# Patient Record
Sex: Female | Born: 1945
Health system: Southern US, Community
[De-identification: ages and names within clinical notes are randomized; demographics above are authoritative.]

## PROBLEM LIST (undated history)

## (undated) DIAGNOSIS — I208 Other forms of angina pectoris: Secondary | ICD-10-CM

## (undated) DIAGNOSIS — F329 Major depressive disorder, single episode, unspecified: Secondary | ICD-10-CM

## (undated) DIAGNOSIS — I130 Hypertensive heart and chronic kidney disease with heart failure and stage 1 through stage 4 chronic kidney disease, or unspecified chronic kidney disease: Secondary | ICD-10-CM

## (undated) DIAGNOSIS — J189 Pneumonia, unspecified organism: Secondary | ICD-10-CM

## (undated) DIAGNOSIS — F32A Depression, unspecified: Secondary | ICD-10-CM

## (undated) DIAGNOSIS — W19XXXA Unspecified fall, initial encounter: Secondary | ICD-10-CM

## (undated) DIAGNOSIS — E785 Hyperlipidemia, unspecified: Secondary | ICD-10-CM

## (undated) DIAGNOSIS — F0153 Vascular dementia, unspecified severity, with mood disturbance: Secondary | ICD-10-CM

## (undated) DIAGNOSIS — N189 Chronic kidney disease, unspecified: Secondary | ICD-10-CM

## (undated) DIAGNOSIS — K5909 Other constipation: Secondary | ICD-10-CM

## (undated) DIAGNOSIS — K219 Gastro-esophageal reflux disease without esophagitis: Secondary | ICD-10-CM

## (undated) DIAGNOSIS — C20 Malignant neoplasm of rectum: Secondary | ICD-10-CM

## (undated) DIAGNOSIS — I69922 Dysarthria following unspecified cerebrovascular disease: Secondary | ICD-10-CM

## (undated) DIAGNOSIS — S3210XA Unspecified fracture of sacrum, initial encounter for closed fracture: Secondary | ICD-10-CM

## (undated) DIAGNOSIS — I219 Acute myocardial infarction, unspecified: Secondary | ICD-10-CM

## (undated) DIAGNOSIS — F0390 Unspecified dementia without behavioral disturbance: Secondary | ICD-10-CM

## (undated) DIAGNOSIS — R410 Disorientation, unspecified: Secondary | ICD-10-CM

## (undated) DIAGNOSIS — J690 Pneumonitis due to inhalation of food and vomit: Secondary | ICD-10-CM

## (undated) DIAGNOSIS — J449 Chronic obstructive pulmonary disease, unspecified: Secondary | ICD-10-CM

## (undated) DIAGNOSIS — I2510 Atherosclerotic heart disease of native coronary artery without angina pectoris: Secondary | ICD-10-CM

## (undated) DIAGNOSIS — F19921 Other psychoactive substance use, unspecified with intoxication with delirium: Secondary | ICD-10-CM

## (undated) DIAGNOSIS — F0151 Vascular dementia with behavioral disturbance: Secondary | ICD-10-CM

## (undated) DIAGNOSIS — I272 Pulmonary hypertension, unspecified: Secondary | ICD-10-CM

## (undated) DIAGNOSIS — E1151 Type 2 diabetes mellitus with diabetic peripheral angiopathy without gangrene: Secondary | ICD-10-CM

## (undated) DIAGNOSIS — I639 Cerebral infarction, unspecified: Secondary | ICD-10-CM

## (undated) DIAGNOSIS — J9600 Acute respiratory failure, unspecified whether with hypoxia or hypercapnia: Secondary | ICD-10-CM

## (undated) DIAGNOSIS — I5032 Chronic diastolic (congestive) heart failure: Secondary | ICD-10-CM

## (undated) DIAGNOSIS — F0150 Vascular dementia without behavioral disturbance: Secondary | ICD-10-CM

## (undated) DIAGNOSIS — E049 Nontoxic goiter, unspecified: Secondary | ICD-10-CM

## (undated) DIAGNOSIS — Z9981 Dependence on supplemental oxygen: Secondary | ICD-10-CM

## (undated) DIAGNOSIS — G43909 Migraine, unspecified, not intractable, without status migrainosus: Secondary | ICD-10-CM

## (undated) DIAGNOSIS — T50905A Adverse effect of unspecified drugs, medicaments and biological substances, initial encounter: Secondary | ICD-10-CM

## (undated) HISTORY — DX: Other forms of angina pectoris: I20.8

## (undated) HISTORY — DX: Type 2 diabetes mellitus with diabetic peripheral angiopathy without gangrene: E11.51

## (undated) HISTORY — DX: Malignant neoplasm of rectum: C20

## (undated) HISTORY — DX: Chronic diastolic (congestive) heart failure: I50.32

## (undated) HISTORY — DX: Dysarthria following unspecified cerebrovascular disease: I69.922

## (undated) HISTORY — DX: Vascular dementia without behavioral disturbance: F01.50

## (undated) HISTORY — DX: Other psychoactive substance use, unspecified with intoxication with delirium: F19.921

## (undated) HISTORY — DX: Atherosclerotic heart disease of native coronary artery without angina pectoris: I25.10

## (undated) HISTORY — DX: Pulmonary hypertension, unspecified: I27.20

## (undated) HISTORY — PX: KYPHOPLASTY: SHX5884

## (undated) HISTORY — DX: Nontoxic goiter, unspecified: E04.9

## (undated) HISTORY — DX: Unspecified fracture of sacrum, initial encounter for closed fracture: S32.10XA

## (undated) HISTORY — DX: Disorientation, unspecified: R41.0

## (undated) HISTORY — DX: Hyperlipidemia, unspecified: E78.5

## (undated) HISTORY — DX: Vascular dementia with behavioral disturbance: F01.51

## (undated) HISTORY — PX: APPENDECTOMY: SHX54

## (undated) HISTORY — DX: Acute respiratory failure, unspecified whether with hypoxia or hypercapnia: J96.00

## (undated) HISTORY — DX: Adverse effect of unspecified drugs, medicaments and biological substances, initial encounter: T50.905A

## (undated) HISTORY — DX: Depression, unspecified: F32.A

## (undated) HISTORY — PX: ABDOMINAL HYSTERECTOMY: SHX81

## (undated) HISTORY — DX: Unspecified fall, initial encounter: W19.XXXA

## (undated) HISTORY — DX: Pneumonitis due to inhalation of food and vomit: J69.0

## (undated) HISTORY — DX: Hypertensive heart and chronic kidney disease with heart failure and stage 1 through stage 4 chronic kidney disease, or unspecified chronic kidney disease: I13.0

## (undated) HISTORY — DX: Major depressive disorder, single episode, unspecified: F32.9

## (undated) HISTORY — PX: COLON SURGERY: SHX602

## (undated) HISTORY — DX: Vascular dementia, unspecified severity, with mood disturbance: F01.53

## (undated) HISTORY — DX: Pneumonia, unspecified organism: J18.9

## (undated) HISTORY — PX: COLOSTOMY: SHX63

## (undated) HISTORY — DX: Other constipation: K59.09

## (undated) HISTORY — PX: TONSILLECTOMY: SUR1361

## (undated) HISTORY — DX: Cerebral infarction, unspecified: I63.9

## (undated) HISTORY — DX: Acute myocardial infarction, unspecified: I21.9

## (undated) HISTORY — PX: TUBAL LIGATION: SHX77

## (undated) HISTORY — DX: Chronic obstructive pulmonary disease, unspecified: J44.9

---

## 2001-03-15 ENCOUNTER — Emergency Department (HOSPITAL_COMMUNITY): Admission: EM | Admit: 2001-03-15 | Discharge: 2001-03-15 | Payer: Self-pay | Admitting: Emergency Medicine

## 2001-03-18 ENCOUNTER — Emergency Department (HOSPITAL_COMMUNITY): Admission: EM | Admit: 2001-03-18 | Discharge: 2001-03-18 | Payer: Self-pay | Admitting: Emergency Medicine

## 2001-03-19 ENCOUNTER — Emergency Department (HOSPITAL_COMMUNITY): Admission: EM | Admit: 2001-03-19 | Discharge: 2001-03-19 | Payer: Self-pay | Admitting: *Deleted

## 2001-03-21 ENCOUNTER — Emergency Department (HOSPITAL_COMMUNITY): Admission: EM | Admit: 2001-03-21 | Discharge: 2001-03-21 | Payer: Self-pay | Admitting: Emergency Medicine

## 2001-03-22 ENCOUNTER — Emergency Department (HOSPITAL_COMMUNITY): Admission: EM | Admit: 2001-03-22 | Discharge: 2001-03-23 | Payer: Self-pay | Admitting: *Deleted

## 2003-07-08 ENCOUNTER — Emergency Department (HOSPITAL_COMMUNITY): Admission: EM | Admit: 2003-07-08 | Discharge: 2003-07-09 | Payer: Self-pay | Admitting: Emergency Medicine

## 2004-09-02 ENCOUNTER — Inpatient Hospital Stay (HOSPITAL_COMMUNITY): Admission: EM | Admit: 2004-09-02 | Discharge: 2004-09-04 | Payer: Self-pay | Admitting: Emergency Medicine

## 2004-09-02 ENCOUNTER — Encounter (INDEPENDENT_AMBULATORY_CARE_PROVIDER_SITE_OTHER): Payer: Self-pay | Admitting: Cardiology

## 2005-01-09 ENCOUNTER — Emergency Department (HOSPITAL_COMMUNITY): Admission: EM | Admit: 2005-01-09 | Discharge: 2005-01-09 | Payer: Self-pay | Admitting: Emergency Medicine

## 2005-10-25 ENCOUNTER — Inpatient Hospital Stay (HOSPITAL_COMMUNITY): Admission: EM | Admit: 2005-10-25 | Discharge: 2005-11-03 | Payer: Self-pay | Admitting: Emergency Medicine

## 2005-10-28 ENCOUNTER — Ambulatory Visit: Payer: Self-pay | Admitting: Cardiology

## 2005-10-28 ENCOUNTER — Encounter: Payer: Self-pay | Admitting: Cardiology

## 2005-10-30 ENCOUNTER — Encounter: Payer: Self-pay | Admitting: Internal Medicine

## 2006-07-16 ENCOUNTER — Ambulatory Visit: Payer: Self-pay | Admitting: Nurse Practitioner

## 2006-08-10 ENCOUNTER — Ambulatory Visit: Payer: Self-pay | Admitting: Nurse Practitioner

## 2006-10-11 ENCOUNTER — Ambulatory Visit: Payer: Self-pay | Admitting: Nurse Practitioner

## 2007-01-16 ENCOUNTER — Emergency Department (HOSPITAL_COMMUNITY): Admission: EM | Admit: 2007-01-16 | Discharge: 2007-01-16 | Payer: Self-pay | Admitting: Emergency Medicine

## 2007-02-24 ENCOUNTER — Ambulatory Visit: Payer: Self-pay | Admitting: Nurse Practitioner

## 2007-03-03 ENCOUNTER — Ambulatory Visit: Payer: Self-pay | Admitting: Nurse Practitioner

## 2007-03-17 ENCOUNTER — Ambulatory Visit: Payer: Self-pay | Admitting: Nurse Practitioner

## 2007-07-21 ENCOUNTER — Emergency Department (HOSPITAL_COMMUNITY): Admission: EM | Admit: 2007-07-21 | Discharge: 2007-07-21 | Payer: Self-pay | Admitting: Emergency Medicine

## 2007-07-27 ENCOUNTER — Encounter (INDEPENDENT_AMBULATORY_CARE_PROVIDER_SITE_OTHER): Payer: Self-pay | Admitting: *Deleted

## 2007-09-07 DIAGNOSIS — I10 Essential (primary) hypertension: Secondary | ICD-10-CM

## 2007-09-07 DIAGNOSIS — I6350 Cerebral infarction due to unspecified occlusion or stenosis of unspecified cerebral artery: Secondary | ICD-10-CM | POA: Insufficient documentation

## 2008-03-06 ENCOUNTER — Ambulatory Visit: Payer: Self-pay | Admitting: Vascular Surgery

## 2008-03-06 ENCOUNTER — Inpatient Hospital Stay (HOSPITAL_COMMUNITY): Admission: EM | Admit: 2008-03-06 | Discharge: 2008-03-11 | Payer: Self-pay | Admitting: Emergency Medicine

## 2008-03-06 ENCOUNTER — Ambulatory Visit: Payer: Self-pay | Admitting: Cardiology

## 2008-03-06 ENCOUNTER — Encounter (INDEPENDENT_AMBULATORY_CARE_PROVIDER_SITE_OTHER): Payer: Self-pay | Admitting: *Deleted

## 2008-03-09 ENCOUNTER — Encounter (INDEPENDENT_AMBULATORY_CARE_PROVIDER_SITE_OTHER): Payer: Self-pay | Admitting: Internal Medicine

## 2008-03-10 ENCOUNTER — Encounter: Payer: Self-pay | Admitting: Cardiology

## 2008-12-21 ENCOUNTER — Ambulatory Visit: Payer: Self-pay | Admitting: Family Medicine

## 2008-12-21 ENCOUNTER — Encounter (INDEPENDENT_AMBULATORY_CARE_PROVIDER_SITE_OTHER): Payer: Self-pay | Admitting: Internal Medicine

## 2008-12-21 LAB — CONVERTED CEMR LAB
ALT: 9 units/L (ref 0–35)
AST: 12 units/L (ref 0–37)
Amphetamine Screen, Ur: NEGATIVE
Barbiturate Quant, Ur: NEGATIVE
Cocaine Metabolites: NEGATIVE
Creatinine, Ser: 1.41 mg/dL — ABNORMAL HIGH (ref 0.40–1.20)
Opiate Screen, Urine: NEGATIVE
Phencyclidine (PCP): NEGATIVE
Total Bilirubin: 0.3 mg/dL (ref 0.3–1.2)

## 2008-12-22 ENCOUNTER — Encounter (INDEPENDENT_AMBULATORY_CARE_PROVIDER_SITE_OTHER): Payer: Self-pay | Admitting: Internal Medicine

## 2008-12-25 ENCOUNTER — Ambulatory Visit: Payer: Self-pay | Admitting: Internal Medicine

## 2009-01-14 ENCOUNTER — Ambulatory Visit: Payer: Self-pay | Admitting: Internal Medicine

## 2009-01-14 ENCOUNTER — Encounter (INDEPENDENT_AMBULATORY_CARE_PROVIDER_SITE_OTHER): Payer: Self-pay | Admitting: Internal Medicine

## 2009-01-14 LAB — CONVERTED CEMR LAB: Potassium: 3.4 meq/L — ABNORMAL LOW (ref 3.5–5.3)

## 2009-02-26 ENCOUNTER — Ambulatory Visit: Payer: Self-pay | Admitting: Family Medicine

## 2009-02-26 LAB — CONVERTED CEMR LAB
CO2: 27 meq/L (ref 19–32)
Chloride: 103 meq/L (ref 96–112)
Creatinine, Ser: 1.37 mg/dL — ABNORMAL HIGH (ref 0.40–1.20)
Potassium: 4.4 meq/L (ref 3.5–5.3)

## 2009-02-27 ENCOUNTER — Encounter (INDEPENDENT_AMBULATORY_CARE_PROVIDER_SITE_OTHER): Payer: Self-pay | Admitting: Family Medicine

## 2009-02-28 ENCOUNTER — Ambulatory Visit: Payer: Self-pay | Admitting: Internal Medicine

## 2009-03-01 ENCOUNTER — Encounter (INDEPENDENT_AMBULATORY_CARE_PROVIDER_SITE_OTHER): Payer: Self-pay | Admitting: Internal Medicine

## 2009-03-14 ENCOUNTER — Ambulatory Visit: Payer: Self-pay | Admitting: Internal Medicine

## 2009-03-15 ENCOUNTER — Encounter (INDEPENDENT_AMBULATORY_CARE_PROVIDER_SITE_OTHER): Payer: Self-pay | Admitting: Internal Medicine

## 2009-03-18 ENCOUNTER — Encounter (INDEPENDENT_AMBULATORY_CARE_PROVIDER_SITE_OTHER): Payer: Self-pay | Admitting: Internal Medicine

## 2009-03-18 ENCOUNTER — Ambulatory Visit: Payer: Self-pay | Admitting: Internal Medicine

## 2009-03-18 LAB — CONVERTED CEMR LAB
BUN: 20 mg/dL (ref 6–23)
Chloride: 100 meq/L (ref 96–112)
Creatinine, Ser: 1.41 mg/dL — ABNORMAL HIGH (ref 0.40–1.20)
Glucose, Bld: 101 mg/dL — ABNORMAL HIGH (ref 70–99)

## 2009-04-03 ENCOUNTER — Ambulatory Visit: Payer: Self-pay | Admitting: Internal Medicine

## 2009-05-06 ENCOUNTER — Ambulatory Visit: Payer: Self-pay | Admitting: Internal Medicine

## 2009-05-17 ENCOUNTER — Ambulatory Visit: Payer: Self-pay | Admitting: Internal Medicine

## 2009-05-23 ENCOUNTER — Ambulatory Visit (HOSPITAL_COMMUNITY): Admission: RE | Admit: 2009-05-23 | Discharge: 2009-05-23 | Payer: Self-pay | Admitting: Family Medicine

## 2009-05-27 ENCOUNTER — Encounter (INDEPENDENT_AMBULATORY_CARE_PROVIDER_SITE_OTHER): Payer: Self-pay | Admitting: Internal Medicine

## 2009-05-27 ENCOUNTER — Ambulatory Visit: Payer: Self-pay | Admitting: Internal Medicine

## 2009-05-27 LAB — CONVERTED CEMR LAB
Basophils Absolute: 0 10*3/uL (ref 0.0–0.1)
Basophils Relative: 1 % (ref 0–1)
Eosinophils Absolute: 0.4 10*3/uL (ref 0.0–0.7)
MCHC: 32 g/dL (ref 30.0–36.0)
MCV: 79.8 fL (ref 78.0–100.0)
Monocytes Relative: 8 % (ref 3–12)
Neutro Abs: 3.2 10*3/uL (ref 1.7–7.7)
Neutrophils Relative %: 48 % (ref 43–77)
Platelets: 355 10*3/uL (ref 150–400)
Prothrombin Time: 13.8 s (ref 11.6–15.2)
RBC: 5.05 M/uL (ref 3.87–5.11)
RDW: 14.4 % (ref 11.5–15.5)

## 2009-05-29 ENCOUNTER — Encounter (INDEPENDENT_AMBULATORY_CARE_PROVIDER_SITE_OTHER): Payer: Self-pay | Admitting: Internal Medicine

## 2009-05-29 ENCOUNTER — Ambulatory Visit: Payer: Self-pay | Admitting: Internal Medicine

## 2009-05-29 LAB — CONVERTED CEMR LAB
Basophils Absolute: 0.1 10*3/uL (ref 0.0–0.1)
Basophils Relative: 1 % (ref 0–1)
Eosinophils Absolute: 0.3 10*3/uL (ref 0.0–0.7)
MCHC: 31.5 g/dL (ref 30.0–36.0)
MCV: 80.7 fL (ref 78.0–100.0)
Monocytes Relative: 7 % (ref 3–12)
Neutro Abs: 4 10*3/uL (ref 1.7–7.7)
Neutrophils Relative %: 55 % (ref 43–77)
Platelets: 335 10*3/uL (ref 150–400)
RDW: 15 % (ref 11.5–15.5)

## 2009-06-17 ENCOUNTER — Encounter: Payer: Self-pay | Admitting: Gastroenterology

## 2009-07-17 ENCOUNTER — Ambulatory Visit: Payer: Self-pay | Admitting: Oncology

## 2009-07-18 ENCOUNTER — Ambulatory Visit: Payer: Self-pay | Admitting: Internal Medicine

## 2009-07-24 LAB — COMPREHENSIVE METABOLIC PANEL
ALT: 11 U/L (ref 0–35)
AST: 14 U/L (ref 0–37)
BUN: 17 mg/dL (ref 6–23)
CO2: 26 mEq/L (ref 19–32)
Creatinine, Ser: 1.23 mg/dL — ABNORMAL HIGH (ref 0.40–1.20)
Total Bilirubin: 0.4 mg/dL (ref 0.3–1.2)

## 2009-07-24 LAB — CEA: CEA: 6.8 ng/mL — ABNORMAL HIGH (ref 0.0–5.0)

## 2009-07-24 LAB — CBC WITH DIFFERENTIAL/PLATELET
BASO%: 0.6 % (ref 0.0–2.0)
Basophils Absolute: 0 10*3/uL (ref 0.0–0.1)
EOS%: 4.1 % (ref 0.0–7.0)
HCT: 41.3 % (ref 34.8–46.6)
LYMPH%: 36.5 % (ref 14.0–49.7)
MCH: 26.4 pg (ref 25.1–34.0)
MCHC: 33.3 g/dL (ref 31.5–36.0)
NEUT%: 51.1 % (ref 38.4–76.8)
Platelets: 341 10*3/uL (ref 145–400)

## 2009-07-30 ENCOUNTER — Ambulatory Visit: Admission: RE | Admit: 2009-07-30 | Discharge: 2009-10-10 | Payer: Self-pay | Admitting: Radiation Oncology

## 2009-08-02 ENCOUNTER — Ambulatory Visit (HOSPITAL_COMMUNITY): Admission: RE | Admit: 2009-08-02 | Discharge: 2009-08-02 | Payer: Self-pay | Admitting: Oncology

## 2009-08-07 ENCOUNTER — Encounter: Payer: Self-pay | Admitting: Gastroenterology

## 2009-08-09 ENCOUNTER — Telehealth (INDEPENDENT_AMBULATORY_CARE_PROVIDER_SITE_OTHER): Payer: Self-pay | Admitting: *Deleted

## 2009-08-09 ENCOUNTER — Encounter: Payer: Self-pay | Admitting: Gastroenterology

## 2009-08-09 DIAGNOSIS — C20 Malignant neoplasm of rectum: Secondary | ICD-10-CM

## 2009-08-15 ENCOUNTER — Ambulatory Visit: Payer: Self-pay | Admitting: Gastroenterology

## 2009-08-15 ENCOUNTER — Ambulatory Visit (HOSPITAL_COMMUNITY): Admission: RE | Admit: 2009-08-15 | Discharge: 2009-08-15 | Payer: Self-pay | Admitting: Gastroenterology

## 2009-08-15 ENCOUNTER — Encounter: Payer: Self-pay | Admitting: Gastroenterology

## 2009-08-19 ENCOUNTER — Ambulatory Visit: Payer: Self-pay | Admitting: Internal Medicine

## 2009-08-22 ENCOUNTER — Ambulatory Visit (HOSPITAL_COMMUNITY): Admission: RE | Admit: 2009-08-22 | Discharge: 2009-08-22 | Payer: Self-pay | Admitting: Radiation Oncology

## 2009-08-22 ENCOUNTER — Ambulatory Visit: Payer: Self-pay | Admitting: Oncology

## 2009-08-27 ENCOUNTER — Emergency Department (HOSPITAL_COMMUNITY): Admission: EM | Admit: 2009-08-27 | Discharge: 2009-08-27 | Payer: Self-pay | Admitting: Emergency Medicine

## 2009-08-28 ENCOUNTER — Telehealth (INDEPENDENT_AMBULATORY_CARE_PROVIDER_SITE_OTHER): Payer: Self-pay | Admitting: *Deleted

## 2009-09-02 ENCOUNTER — Ambulatory Visit (HOSPITAL_COMMUNITY): Admission: RE | Admit: 2009-09-02 | Discharge: 2009-09-02 | Payer: Self-pay | Admitting: Radiation Oncology

## 2009-09-03 LAB — COMPREHENSIVE METABOLIC PANEL
ALT: <8 U/L (ref 0–35)
AST: 10 U/L (ref 0–37)
Alkaline Phosphatase: 82 U/L (ref 39–117)
Creatinine, Ser: 1.43 mg/dL — ABNORMAL HIGH (ref 0.40–1.20)
Total Bilirubin: 0.4 mg/dL (ref 0.3–1.2)

## 2009-09-03 LAB — CBC WITH DIFFERENTIAL/PLATELET
BASO%: 0.3 % (ref 0.0–2.0)
EOS%: 1.4 % (ref 0.0–7.0)
HCT: 43.1 % (ref 34.8–46.6)
LYMPH%: 12.5 % — ABNORMAL LOW (ref 14.0–49.7)
MCH: 26.6 pg (ref 25.1–34.0)
MCHC: 32.9 g/dL (ref 31.5–36.0)
MONO%: 6.4 % (ref 0.0–14.0)
NEUT%: 79.4 % — ABNORMAL HIGH (ref 38.4–76.8)
lymph#: 0.5 10*3/uL — ABNORMAL LOW (ref 0.9–3.3)

## 2009-09-06 ENCOUNTER — Ambulatory Visit (HOSPITAL_COMMUNITY): Admission: RE | Admit: 2009-09-06 | Discharge: 2009-09-06 | Payer: Self-pay | Admitting: Oncology

## 2009-09-13 LAB — COMPREHENSIVE METABOLIC PANEL
ALT: 12 U/L (ref 0–35)
AST: 17 U/L (ref 0–37)
Albumin: 4.1 g/dL (ref 3.5–5.2)
Calcium: 9 mg/dL (ref 8.4–10.5)
Chloride: 98 mEq/L (ref 96–112)
Potassium: 3.3 mEq/L — ABNORMAL LOW (ref 3.5–5.3)
Total Protein: 7.7 g/dL (ref 6.0–8.3)

## 2009-09-13 LAB — CBC WITH DIFFERENTIAL/PLATELET
BASO%: 0.2 % (ref 0.0–2.0)
EOS%: 2.5 % (ref 0.0–7.0)
HGB: 13.1 g/dL (ref 11.6–15.9)
MCH: 27 pg (ref 25.1–34.0)
MCHC: 32.9 g/dL (ref 31.5–36.0)
MONO#: 0.3 10*3/uL (ref 0.1–0.9)
RDW: 15.9 % — ABNORMAL HIGH (ref 11.2–14.5)
WBC: 4.8 10*3/uL (ref 3.9–10.3)
lymph#: 0.3 10*3/uL — ABNORMAL LOW (ref 0.9–3.3)

## 2009-09-24 ENCOUNTER — Ambulatory Visit: Payer: Self-pay | Admitting: Oncology

## 2009-09-26 LAB — COMPREHENSIVE METABOLIC PANEL
ALT: 9 U/L (ref 0–35)
AST: 14 U/L (ref 0–37)
Calcium: 9.4 mg/dL (ref 8.4–10.5)
Chloride: 99 mEq/L (ref 96–112)
Creatinine, Ser: 1.28 mg/dL — ABNORMAL HIGH (ref 0.40–1.20)
Potassium: 3.3 mEq/L — ABNORMAL LOW (ref 3.5–5.3)

## 2009-09-26 LAB — CBC WITH DIFFERENTIAL/PLATELET
BASO%: 0 % (ref 0.0–2.0)
EOS%: 2.3 % (ref 0.0–7.0)
MCH: 27 pg (ref 25.1–34.0)
MCHC: 32.6 g/dL (ref 31.5–36.0)
NEUT%: 82.4 % — ABNORMAL HIGH (ref 38.4–76.8)
RBC: 4.41 10*6/uL (ref 3.70–5.45)
RDW: 18.2 % — ABNORMAL HIGH (ref 11.2–14.5)
lymph#: 0.3 10*3/uL — ABNORMAL LOW (ref 0.9–3.3)

## 2009-09-30 ENCOUNTER — Ambulatory Visit: Payer: Self-pay | Admitting: Internal Medicine

## 2009-09-30 ENCOUNTER — Encounter (INDEPENDENT_AMBULATORY_CARE_PROVIDER_SITE_OTHER): Payer: Self-pay | Admitting: Internal Medicine

## 2009-09-30 LAB — CONVERTED CEMR LAB
ALT: 9 units/L (ref 0–35)
Alkaline Phosphatase: 75 units/L (ref 39–117)
CO2: 27 meq/L (ref 19–32)
Creatinine, Ser: 1.25 mg/dL — ABNORMAL HIGH (ref 0.40–1.20)
Sodium: 144 meq/L (ref 135–145)
Total Bilirubin: 0.4 mg/dL (ref 0.3–1.2)
Total Protein: 7.2 g/dL (ref 6.0–8.3)

## 2009-10-16 LAB — COMPREHENSIVE METABOLIC PANEL
Albumin: 4.4 g/dL (ref 3.5–5.2)
Alkaline Phosphatase: 81 U/L (ref 39–117)
Calcium: 9.5 mg/dL (ref 8.4–10.5)
Creatinine, Ser: 1.32 mg/dL — ABNORMAL HIGH (ref 0.40–1.20)
Glucose, Bld: 95 mg/dL (ref 70–99)
Sodium: 140 mEq/L (ref 135–145)
Total Bilirubin: 0.4 mg/dL (ref 0.3–1.2)

## 2009-10-16 LAB — CBC WITH DIFFERENTIAL/PLATELET
BASO%: 0.4 % (ref 0.0–2.0)
LYMPH%: 16.1 % (ref 14.0–49.7)
MCHC: 32.6 g/dL (ref 31.5–36.0)
MCV: 84.1 fL (ref 79.5–101.0)
MONO%: 8.8 % (ref 0.0–14.0)
NEUT%: 72.4 % (ref 38.4–76.8)
Platelets: 212 10*3/uL (ref 145–400)
RBC: 4.7 10*6/uL (ref 3.70–5.45)

## 2009-10-28 ENCOUNTER — Encounter: Payer: Self-pay | Admitting: Gastroenterology

## 2009-11-09 DIAGNOSIS — I639 Cerebral infarction, unspecified: Secondary | ICD-10-CM

## 2009-11-09 HISTORY — DX: Cerebral infarction, unspecified: I63.9

## 2009-11-22 ENCOUNTER — Ambulatory Visit: Payer: Self-pay | Admitting: Oncology

## 2009-11-25 ENCOUNTER — Encounter (INDEPENDENT_AMBULATORY_CARE_PROVIDER_SITE_OTHER): Payer: Self-pay | Admitting: Adult Health

## 2009-11-25 ENCOUNTER — Ambulatory Visit: Payer: Self-pay | Admitting: Internal Medicine

## 2009-11-25 LAB — CONVERTED CEMR LAB
BUN: 27 mg/dL — ABNORMAL HIGH (ref 6–23)
Basophils Relative: 0 % (ref 0–1)
CO2: 28 meq/L (ref 19–32)
Calcium: 9.5 mg/dL (ref 8.4–10.5)
Chloride: 101 meq/L (ref 96–112)
Creatinine, Ser: 1.71 mg/dL — ABNORMAL HIGH (ref 0.40–1.20)
Eosinophils Absolute: 0.1 10*3/uL (ref 0.0–0.7)
Eosinophils Relative: 2 % (ref 0–5)
Glucose, Bld: 100 mg/dL — ABNORMAL HIGH (ref 70–99)
HCT: 39 % (ref 36.0–46.0)
Lymphs Abs: 0.7 10*3/uL (ref 0.7–4.0)
MCHC: 32.1 g/dL (ref 30.0–36.0)
MCV: 83.7 fL (ref 78.0–100.0)
Monocytes Relative: 8 % (ref 3–12)
Neutrophils Relative %: 69 % (ref 43–77)
RBC: 4.66 M/uL (ref 3.87–5.11)
Total Bilirubin: 0.3 mg/dL (ref 0.3–1.2)
WBC: 3.6 10*3/uL — ABNORMAL LOW (ref 4.0–10.5)

## 2009-12-04 ENCOUNTER — Ambulatory Visit: Payer: Self-pay | Admitting: Internal Medicine

## 2009-12-04 ENCOUNTER — Encounter (INDEPENDENT_AMBULATORY_CARE_PROVIDER_SITE_OTHER): Payer: Self-pay | Admitting: Adult Health

## 2009-12-04 LAB — CONVERTED CEMR LAB
Cholesterol: 327 mg/dL — ABNORMAL HIGH (ref 0–200)
HDL: 39 mg/dL — ABNORMAL LOW (ref >39)
Total CHOL/HDL Ratio: 8.4
Triglycerides: 166 mg/dL — ABNORMAL HIGH (ref <150)
VLDL: 33 mg/dL (ref 0–40)

## 2009-12-11 ENCOUNTER — Ambulatory Visit: Payer: Self-pay | Admitting: Internal Medicine

## 2009-12-11 ENCOUNTER — Encounter (INDEPENDENT_AMBULATORY_CARE_PROVIDER_SITE_OTHER): Payer: Self-pay | Admitting: General Surgery

## 2009-12-11 ENCOUNTER — Inpatient Hospital Stay (HOSPITAL_COMMUNITY): Admission: RE | Admit: 2009-12-11 | Discharge: 2009-12-27 | Payer: Self-pay | Admitting: General Surgery

## 2009-12-12 ENCOUNTER — Encounter (INDEPENDENT_AMBULATORY_CARE_PROVIDER_SITE_OTHER): Payer: Self-pay | Admitting: General Surgery

## 2009-12-12 ENCOUNTER — Encounter (INDEPENDENT_AMBULATORY_CARE_PROVIDER_SITE_OTHER): Payer: Self-pay | Admitting: Cardiology

## 2009-12-14 ENCOUNTER — Encounter: Payer: Self-pay | Admitting: Internal Medicine

## 2010-02-04 DIAGNOSIS — I219 Acute myocardial infarction, unspecified: Secondary | ICD-10-CM | POA: Insufficient documentation

## 2010-02-05 ENCOUNTER — Encounter: Payer: Self-pay | Admitting: Physician Assistant

## 2010-02-05 ENCOUNTER — Telehealth: Payer: Self-pay | Admitting: Cardiovascular Disease

## 2010-02-05 ENCOUNTER — Ambulatory Visit: Payer: Self-pay | Admitting: Cardiovascular Disease

## 2010-02-05 DIAGNOSIS — F1910 Other psychoactive substance abuse, uncomplicated: Secondary | ICD-10-CM | POA: Insufficient documentation

## 2010-02-05 DIAGNOSIS — N189 Chronic kidney disease, unspecified: Secondary | ICD-10-CM | POA: Insufficient documentation

## 2010-02-05 DIAGNOSIS — E785 Hyperlipidemia, unspecified: Secondary | ICD-10-CM

## 2010-02-20 ENCOUNTER — Encounter: Payer: Self-pay | Admitting: Gastroenterology

## 2010-03-03 ENCOUNTER — Telehealth (INDEPENDENT_AMBULATORY_CARE_PROVIDER_SITE_OTHER): Payer: Self-pay

## 2010-03-04 ENCOUNTER — Encounter (HOSPITAL_COMMUNITY): Admission: RE | Admit: 2010-03-04 | Discharge: 2010-05-13 | Payer: Self-pay | Admitting: Cardiovascular Disease

## 2010-03-04 ENCOUNTER — Ambulatory Visit: Payer: Self-pay | Admitting: Cardiology

## 2010-03-04 ENCOUNTER — Encounter (INDEPENDENT_AMBULATORY_CARE_PROVIDER_SITE_OTHER): Payer: Self-pay | Admitting: *Deleted

## 2010-03-04 ENCOUNTER — Ambulatory Visit: Payer: Self-pay

## 2010-03-07 IMAGING — CT CT HEAD W/O CM
1 of 2 series · 16 of 30 positions shown, 20 images · non-contrast
Comparison: Head CT 10/28/2005

CLINICAL DATA: History of rectal cancer.  Left leg weakness.

CT HEAD WITHOUT CONTRAST
TECHNIQUE: Contiguous axial images were obtained from the base of
the skull through the vertex without contrast.

[Series 2: headseq 4.8 h45s · axial · 0.43mm/px · z∈[-161,-37]mm · 16 of 30 slices shown, 20 images]
[im 2/30  brain]
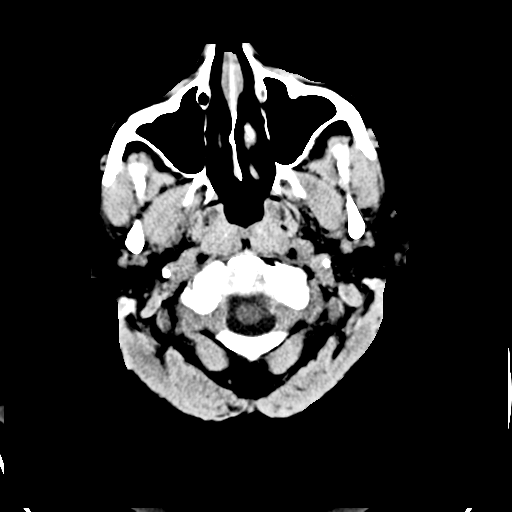
[im 2/30  bone]
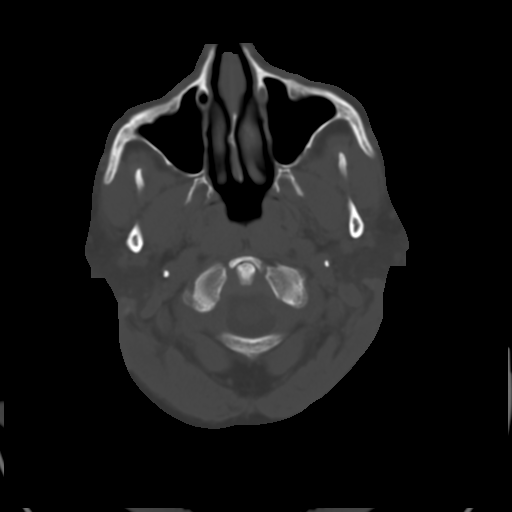
[im 3/30  brain]
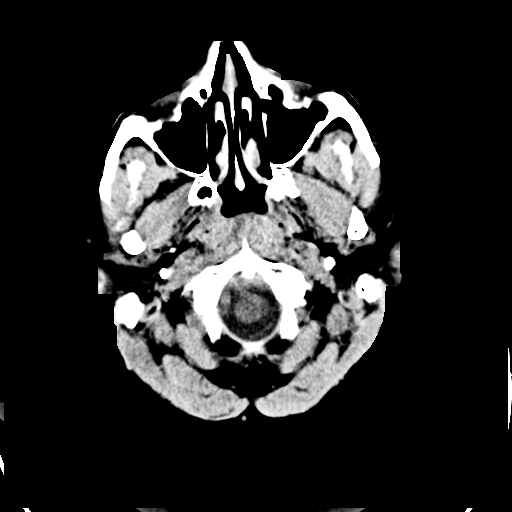
[im 5/30  brain]
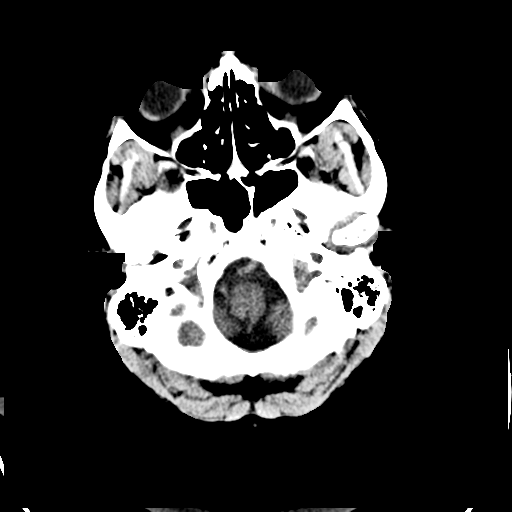
[im 8/30  brain]
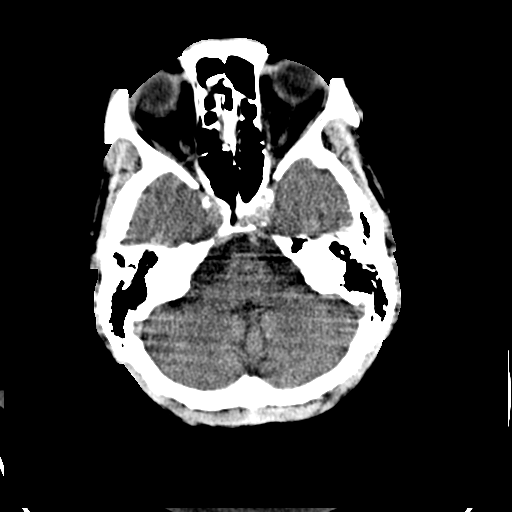
[im 9/30  brain]
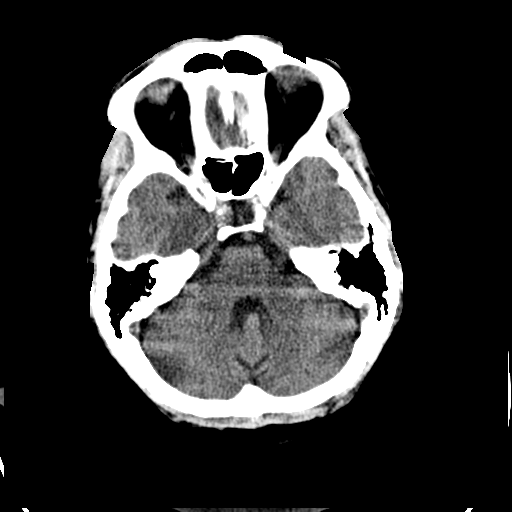
[im 9/30  bone]
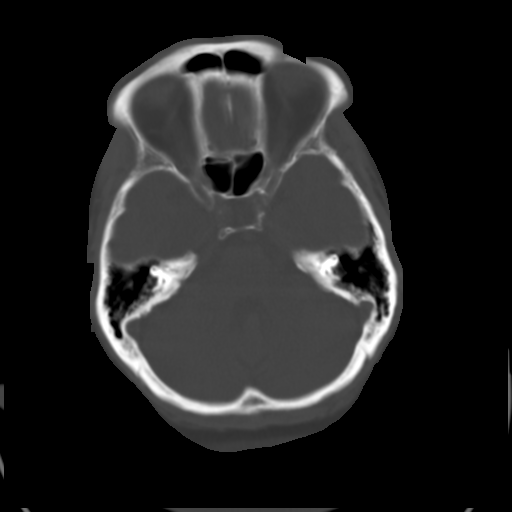
[im 11/30  brain]
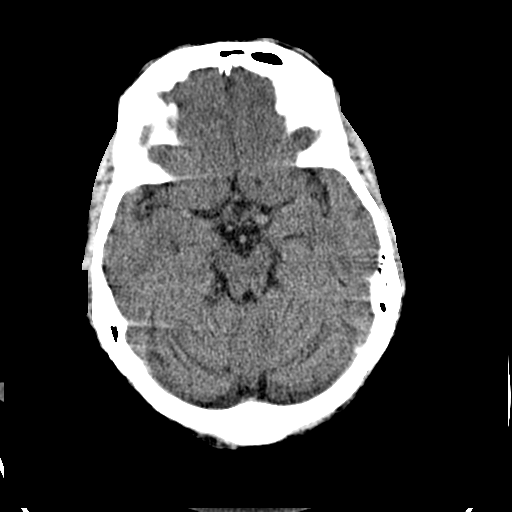
[im 12/30  brain]
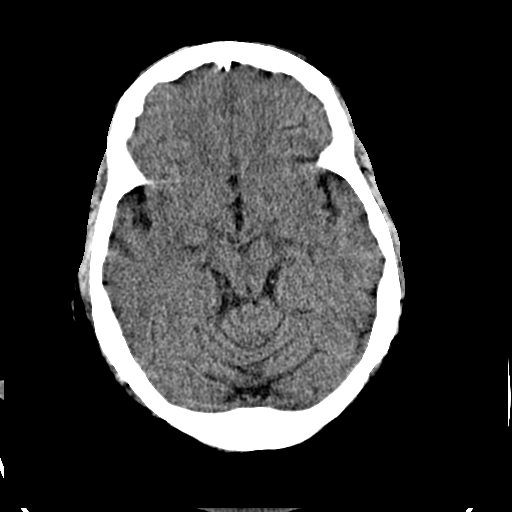
[im 14/30  brain]
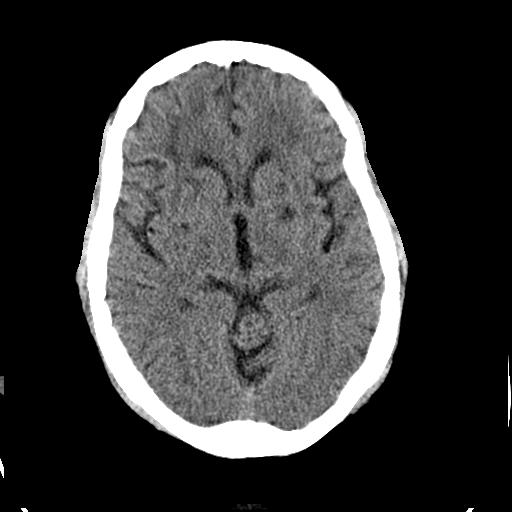
[im 16/30  brain]
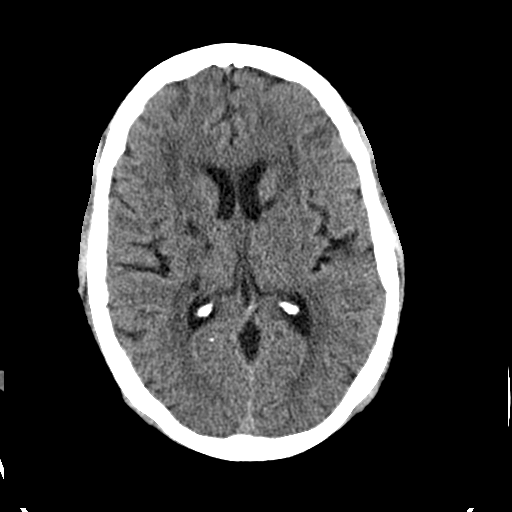
[im 16/30  bone]
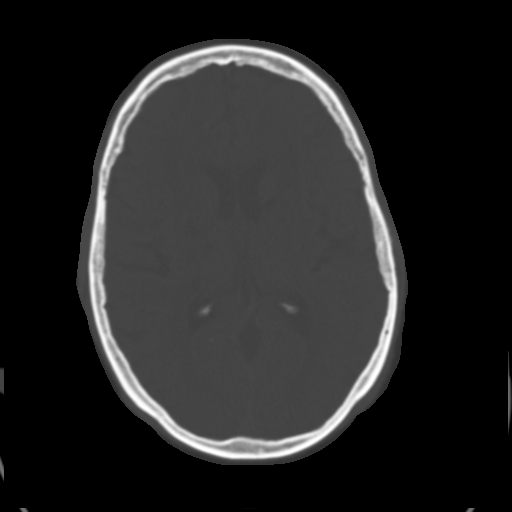
[im 18/30  brain]
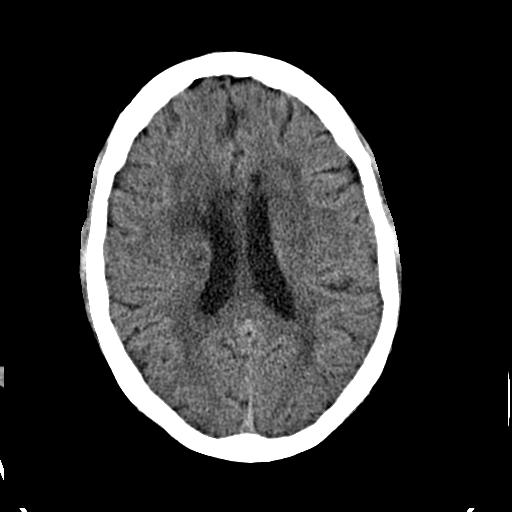
[im 19/30  brain]
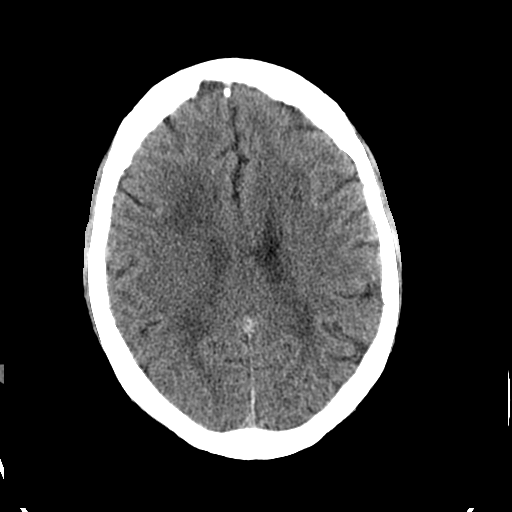
[im 21/30  brain]
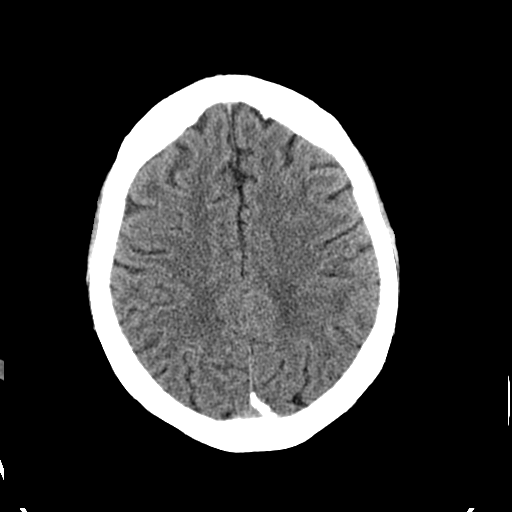
[im 22/30  brain]
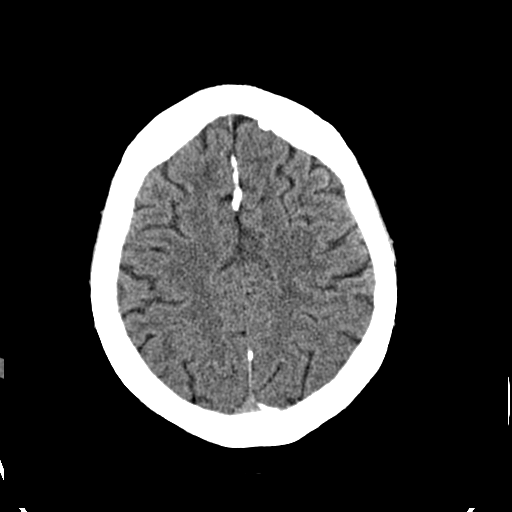
[im 22/30  bone]
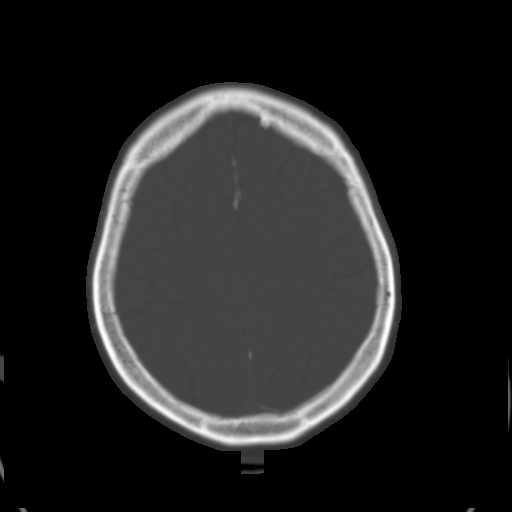
[im 25/30  brain]
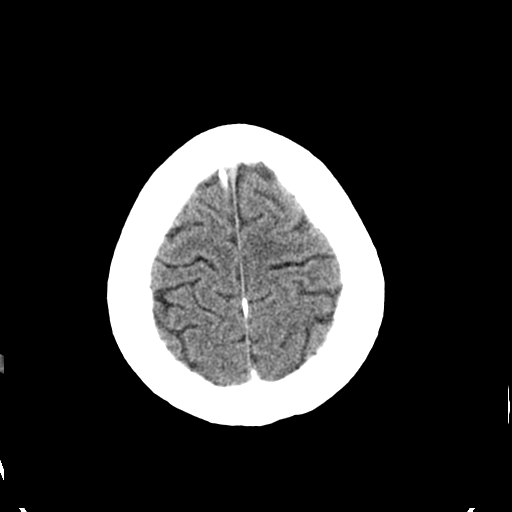
[im 27/30  brain]
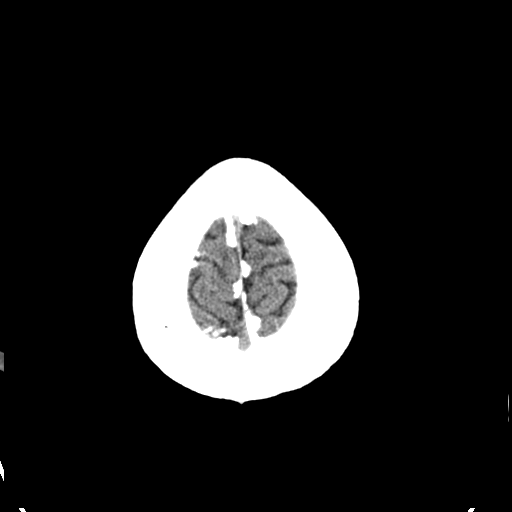
[im 28/30  brain]
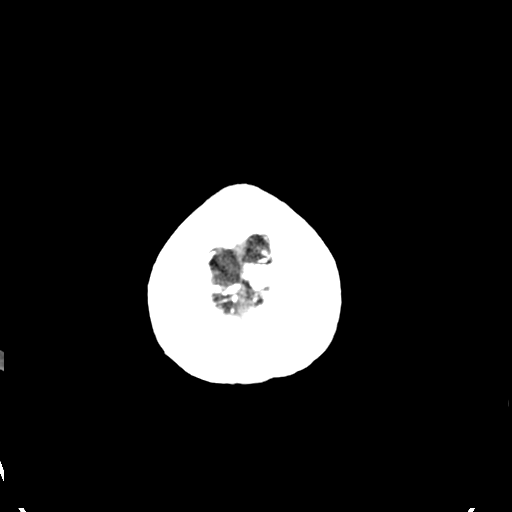

[16 of 30 positions shown; findings below may reference images not displayed]

FINDINGS: Noted evidence of acute intracranial hemorrhage.  No
focal mass lesion.  No CT evidence of acute infarction.  No midline
shift or mass effect.  No hydrocephalus.  There are lacunar
infarctions in the basal ganglia and left pons which are not
changed from prior.  Extensive periventricular and subcortical
white matter disease is also similar to prior.

Paranasal sinuses and mastoid air cells are clear.  Orbits appear
normal.
IMPRESSION: 1. No CT evidence of acute infarction.
2.  No evidence of intracranial hemorrhage.
3.  No evidence of brain metastasis by CT.

## 2010-03-10 ENCOUNTER — Encounter (INDEPENDENT_AMBULATORY_CARE_PROVIDER_SITE_OTHER): Payer: Self-pay | Admitting: *Deleted

## 2010-03-12 IMAGING — CR DG CHEST 2V
2 series · 2 of 2 positions shown · non-contrast
Comparison: 03/06/2008

CLINICAL DATA: weakness

CHEST - 2 VIEW

[w chest lat]
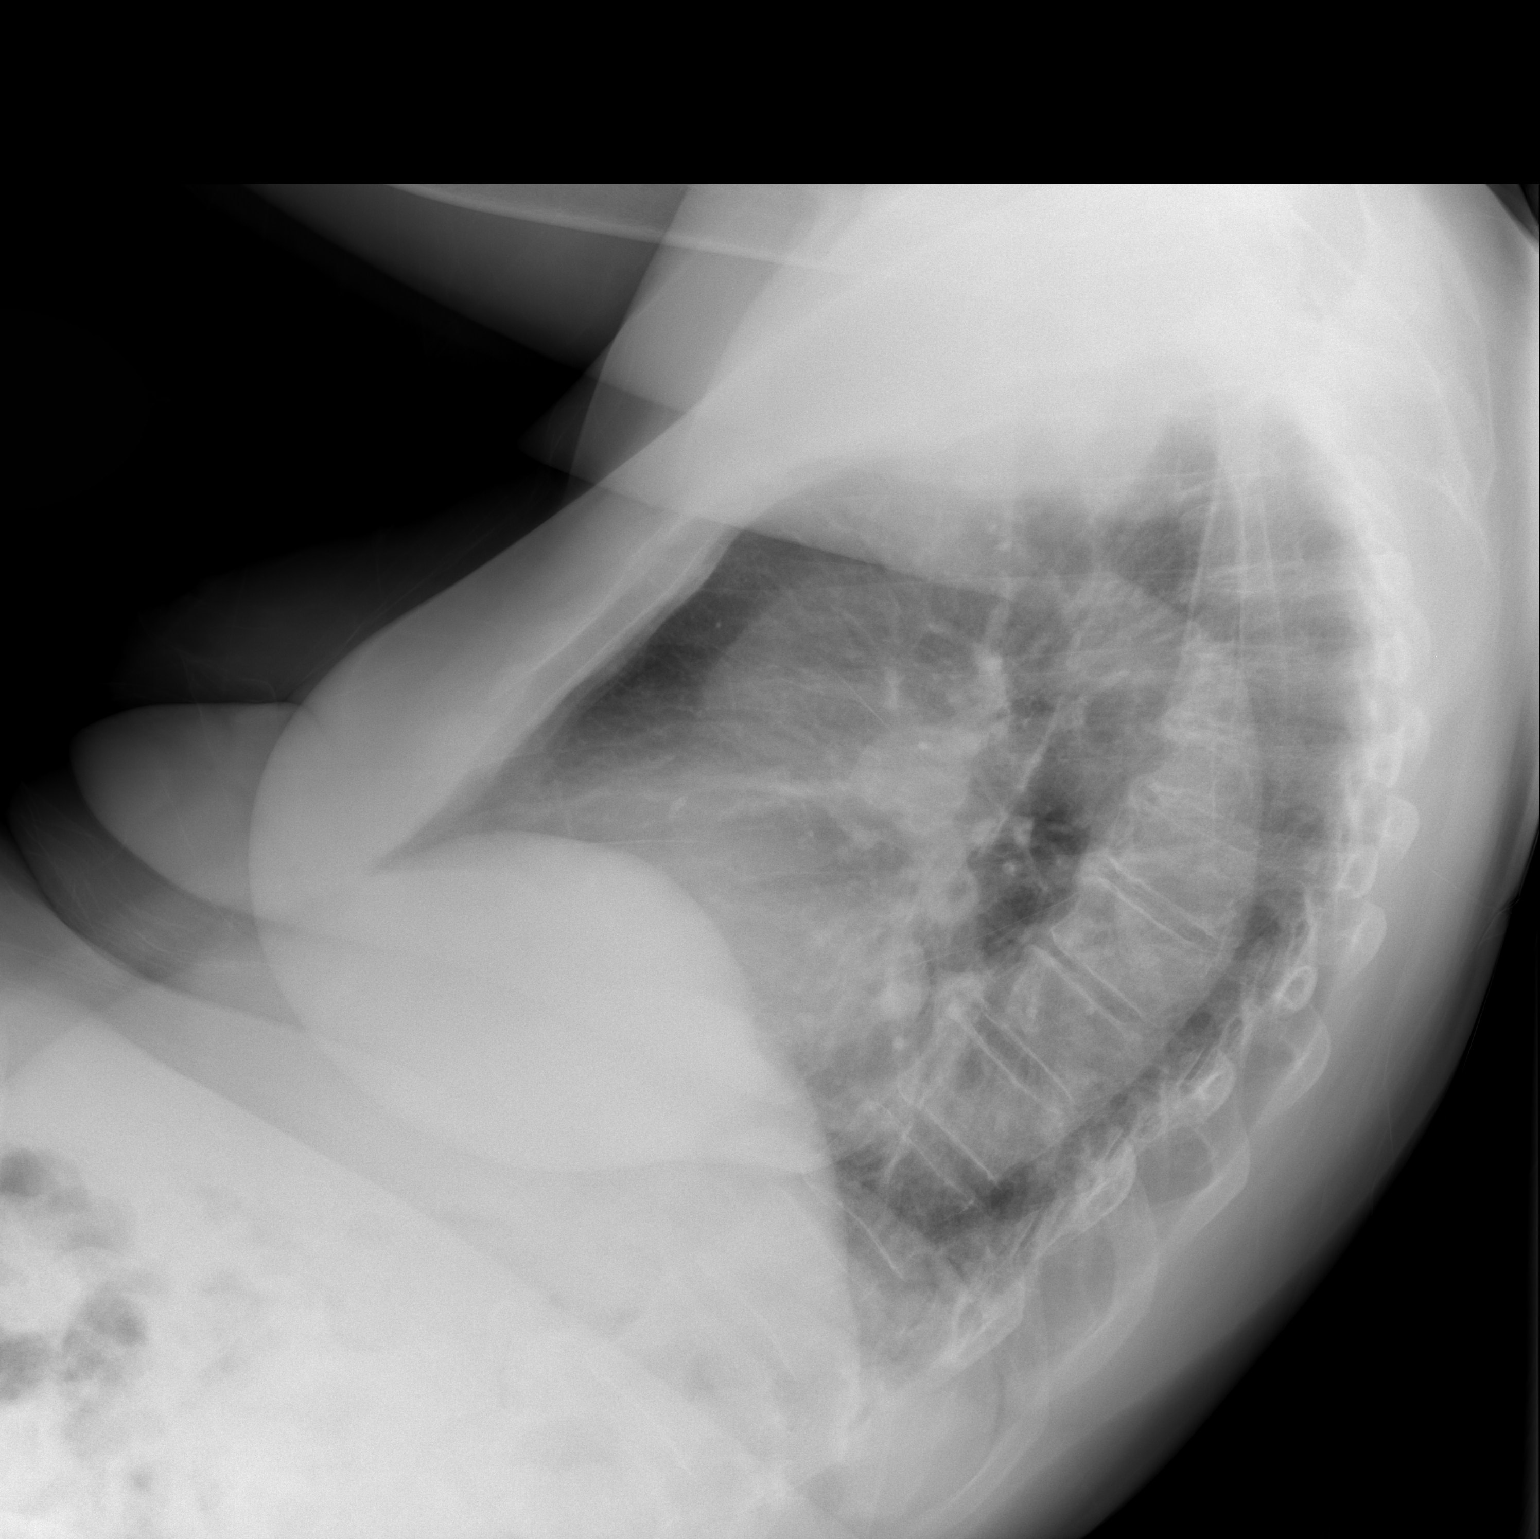

[view not recorded]
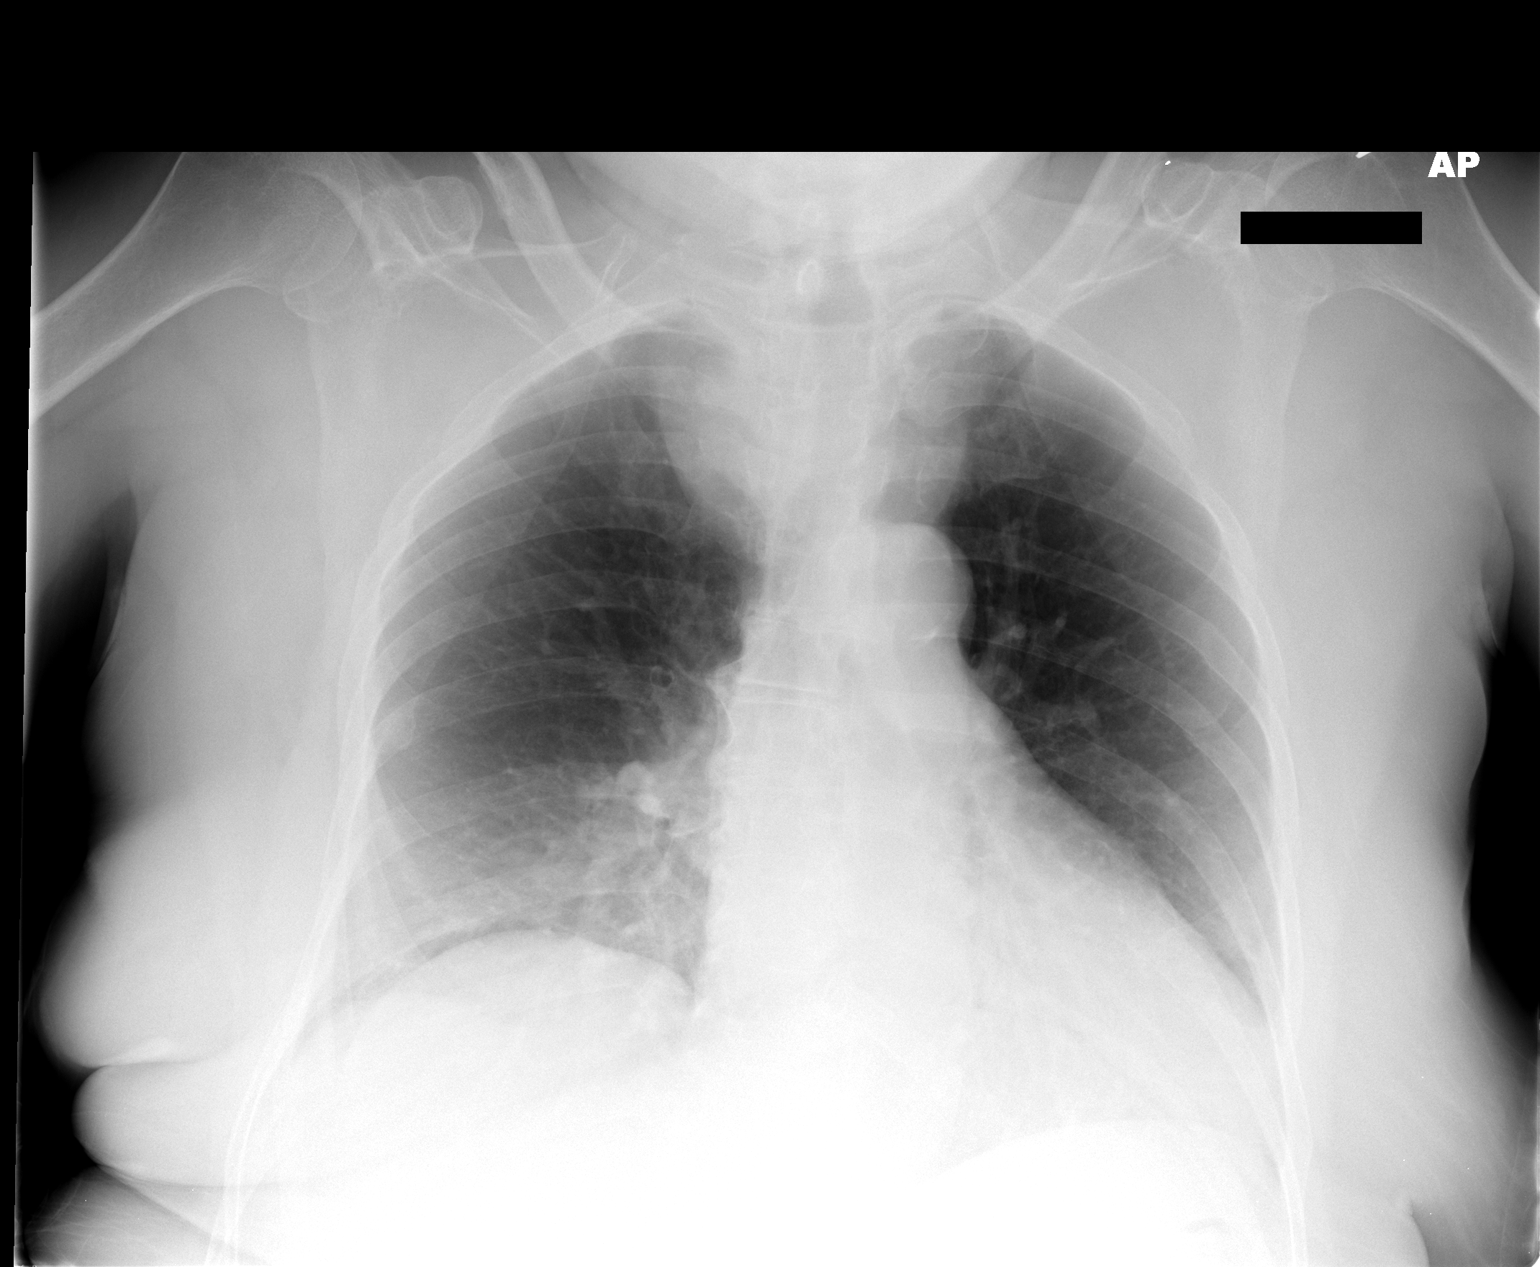

[2 of 2 positions shown; findings below may reference images not displayed]

FINDINGS: Cardiomegaly again noted.  No acute infiltrate or pleural
effusion.  No pulmonary edema.  Soft tissue prominence bilateral
paratracheal is probable due to retrosternal goiter.  There is
elevation of the right hemidiaphragm.  Mild degenerative changes
thoracic spine.
IMPRESSION: No active disease.  Elevation of the right hemidiaphragm.

## 2010-04-01 ENCOUNTER — Ambulatory Visit: Payer: Self-pay | Admitting: Cardiovascular Disease

## 2010-08-19 ENCOUNTER — Ambulatory Visit (HOSPITAL_COMMUNITY): Admission: RE | Admit: 2010-08-19 | Discharge: 2010-08-19 | Payer: Self-pay | Admitting: Family Medicine

## 2010-11-30 ENCOUNTER — Encounter: Payer: Self-pay | Admitting: Family Medicine

## 2010-12-03 ENCOUNTER — Emergency Department (HOSPITAL_COMMUNITY)
Admission: EM | Admit: 2010-12-03 | Discharge: 2010-12-03 | Payer: Self-pay | Source: Home / Self Care | Admitting: Emergency Medicine

## 2010-12-09 ENCOUNTER — Inpatient Hospital Stay (HOSPITAL_COMMUNITY)
Admission: EM | Admit: 2010-12-09 | Discharge: 2010-12-18 | DRG: 981 | Disposition: A | Discharge status: Skilled Nursing Facility | Payer: Medicaid Other | Attending: Family Medicine | Admitting: Family Medicine

## 2010-12-09 DIAGNOSIS — F341 Dysthymic disorder: Secondary | ICD-10-CM | POA: Diagnosis present

## 2010-12-09 DIAGNOSIS — F1910 Other psychoactive substance abuse, uncomplicated: Secondary | ICD-10-CM | POA: Diagnosis present

## 2010-12-09 DIAGNOSIS — G92 Toxic encephalopathy: Secondary | ICD-10-CM | POA: Diagnosis present

## 2010-12-09 DIAGNOSIS — N182 Chronic kidney disease, stage 2 (mild): Secondary | ICD-10-CM | POA: Diagnosis present

## 2010-12-09 DIAGNOSIS — F1110 Opioid abuse, uncomplicated: Secondary | ICD-10-CM | POA: Diagnosis present

## 2010-12-09 DIAGNOSIS — J690 Pneumonitis due to inhalation of food and vomit: Principal | ICD-10-CM | POA: Diagnosis present

## 2010-12-09 DIAGNOSIS — G929 Unspecified toxic encephalopathy: Secondary | ICD-10-CM | POA: Diagnosis present

## 2010-12-09 DIAGNOSIS — F1410 Cocaine abuse, uncomplicated: Secondary | ICD-10-CM | POA: Diagnosis present

## 2010-12-09 DIAGNOSIS — Z933 Colostomy status: Secondary | ICD-10-CM

## 2010-12-09 DIAGNOSIS — I129 Hypertensive chronic kidney disease with stage 1 through stage 4 chronic kidney disease, or unspecified chronic kidney disease: Secondary | ICD-10-CM | POA: Diagnosis present

## 2010-12-09 DIAGNOSIS — W19XXXA Unspecified fall, initial encounter: Secondary | ICD-10-CM | POA: Diagnosis present

## 2010-12-09 DIAGNOSIS — R5381 Other malaise: Secondary | ICD-10-CM | POA: Diagnosis present

## 2010-12-09 DIAGNOSIS — E049 Nontoxic goiter, unspecified: Secondary | ICD-10-CM | POA: Diagnosis present

## 2010-12-09 DIAGNOSIS — I2510 Atherosclerotic heart disease of native coronary artery without angina pectoris: Secondary | ICD-10-CM | POA: Diagnosis present

## 2010-12-09 DIAGNOSIS — T4275XA Adverse effect of unspecified antiepileptic and sedative-hypnotic drugs, initial encounter: Secondary | ICD-10-CM | POA: Diagnosis present

## 2010-12-09 DIAGNOSIS — Y939 Activity, unspecified: Secondary | ICD-10-CM

## 2010-12-09 DIAGNOSIS — I69922 Dysarthria following unspecified cerebrovascular disease: Secondary | ICD-10-CM

## 2010-12-09 DIAGNOSIS — S3210XA Unspecified fracture of sacrum, initial encounter for closed fracture: Secondary | ICD-10-CM | POA: Diagnosis present

## 2010-12-09 DIAGNOSIS — E785 Hyperlipidemia, unspecified: Secondary | ICD-10-CM | POA: Diagnosis present

## 2010-12-09 DIAGNOSIS — Z7982 Long term (current) use of aspirin: Secondary | ICD-10-CM

## 2010-12-09 DIAGNOSIS — Z9049 Acquired absence of other specified parts of digestive tract: Secondary | ICD-10-CM

## 2010-12-09 DIAGNOSIS — Y998 Other external cause status: Secondary | ICD-10-CM

## 2010-12-09 DIAGNOSIS — IMO0002 Reserved for concepts with insufficient information to code with codable children: Secondary | ICD-10-CM

## 2010-12-09 DIAGNOSIS — K219 Gastro-esophageal reflux disease without esophagitis: Secondary | ICD-10-CM | POA: Diagnosis present

## 2010-12-09 DIAGNOSIS — J9600 Acute respiratory failure, unspecified whether with hypoxia or hypercapnia: Secondary | ICD-10-CM | POA: Diagnosis present

## 2010-12-09 DIAGNOSIS — Z79899 Other long term (current) drug therapy: Secondary | ICD-10-CM

## 2010-12-09 DIAGNOSIS — I252 Old myocardial infarction: Secondary | ICD-10-CM

## 2010-12-09 DIAGNOSIS — Y92009 Unspecified place in unspecified non-institutional (private) residence as the place of occurrence of the external cause: Secondary | ICD-10-CM

## 2010-12-09 DIAGNOSIS — Z85038 Personal history of other malignant neoplasm of large intestine: Secondary | ICD-10-CM

## 2010-12-09 LAB — BLOOD GAS, ARTERIAL
Bicarbonate: 26.1 mEq/L — ABNORMAL HIGH (ref 20.0–24.0)
pH, Arterial: 7.447 — ABNORMAL HIGH (ref 7.350–7.400)
pO2, Arterial: 84.7 mmHg (ref 80.0–100.0)

## 2010-12-09 LAB — URINALYSIS, ROUTINE W REFLEX MICROSCOPIC
Bilirubin Urine: NEGATIVE
Leukocytes, UA: NEGATIVE
Nitrite: NEGATIVE
Specific Gravity, Urine: 1.021 (ref 1.005–1.030)
pH: 5.5 (ref 5.0–8.0)

## 2010-12-09 LAB — DIFFERENTIAL
Basophils Absolute: 0 10*3/uL (ref 0.0–0.1)
Basophils Relative: 0 % (ref 0–1)
Eosinophils Relative: 1 % (ref 0–5)
Lymphocytes Relative: 6 % — ABNORMAL LOW (ref 12–46)
Monocytes Absolute: 0.7 10*3/uL (ref 0.1–1.0)

## 2010-12-09 LAB — CBC
HCT: 33.5 % — ABNORMAL LOW (ref 36.0–46.0)
MCHC: 31.9 g/dL (ref 30.0–36.0)
RDW: 14.9 % (ref 11.5–15.5)

## 2010-12-09 LAB — COMPREHENSIVE METABOLIC PANEL
ALT: 52 U/L — ABNORMAL HIGH (ref 0–35)
Calcium: 8.6 mg/dL (ref 8.4–10.5)
Glucose, Bld: 286 mg/dL — ABNORMAL HIGH (ref 70–99)
Sodium: 139 mEq/L (ref 135–145)
Total Protein: 7.1 g/dL (ref 6.0–8.3)

## 2010-12-09 LAB — URINE MICROSCOPIC-ADD ON

## 2010-12-09 LAB — RAPID URINE DRUG SCREEN, HOSP PERFORMED
Amphetamines: POSITIVE — AB
Barbiturates: NOT DETECTED
Benzodiazepines: NOT DETECTED
Cocaine: NOT DETECTED
Opiates: POSITIVE — AB
Tetrahydrocannabinol: NOT DETECTED

## 2010-12-09 LAB — ETHANOL: Alcohol, Ethyl (B): <5 mg/dL (ref 0–10)

## 2010-12-09 LAB — POCT CARDIAC MARKERS
CKMB, poc: 4.4 ng/mL (ref 1.0–8.0)
Myoglobin, poc: >500 ng/mL (ref 12–200)

## 2010-12-09 NOTE — Assessment & Plan Note (Signed)
Summary: EPH  Medications Added CRESTOR 40 MG TABS (ROSUVASTATIN CALCIUM) take one daily RANITIDINE HCL 150 MG CAPS (RANITIDINE HCL) take one two times a day CARVEDILOL 25 MG TABS (CARVEDILOL) take one twice daily CARVEDILOL 12.5 MG TABS (CARVEDILOL) Take one tablet by mouth twice a day LIPITOR 80 MG TABS (ATORVASTATIN CALCIUM) take one daily OMEPRAZOLE 40 MG CPDR (OMEPRAZOLE) take one daily IMDUR 30 MG XR24H-TAB (ISOSORBIDE MONONITRATE) take one daily FUROSEMIDE 20 MG TABS (FUROSEMIDE) take one daily LISINOPRIL 10 MG TABS (LISINOPRIL) take one daily ASPIR-LOW 81 MG TBEC (ASPIRIN) take one daily PAXIL 30 MG TABS (PAROXETINE HCL) take one daily AMLODIPINE BESYLATE 10 MG TABS (AMLODIPINE BESYLATE) take one daily        Visit Type:  Follow-up  CC:  no complaints.  History of Present Illness: This is a 65 year old, African American female, patient, who was undergoing surgery for rectal carcinoma, December 12, 2009 when she developed an MI and CVA in the OR. She was found to be positive for cocaine. Her MI was a non-ST elevation MI and she was treated with low-dose heparin, but her hemoglobin dropped. She was treated medically and did not undergo any further cardiac testing while in the hospital. She has since been sent to a nursing home for rehabilitation because of her stroke.  The patient is in a wheelchair. She Does not speak much and is hard to understand and the caretaker that is with her does not know a lot of her history. The patient denies chest pain, palpitations, dyspnea, dyspnea on exertion, dizziness, or presyncope.  2-D echo in the hospital December 12, 2009 showed distal inferior and apical akinesis. Ejection fraction 55% and diastolic dysfunction.  Current Medications (verified): 1)  Crestor 40 Mg Tabs (Rosuvastatin Calcium) .... Take One Daily 2)  Clonidine Hcl 0.2 Mg  Tabs (Clonidine Hcl) .... Two Times A Day 3)  Avapro 150 Mg  Tabs (Irbesartan) .... Once Daily 4)   Ranitidine Hcl 150 Mg Caps (Ranitidine Hcl) .... Take One Two Times A Day 5)  Carvedilol 25 Mg Tabs (Carvedilol) .... Take One Twice Daily 6)  Lipitor 80 Mg Tabs (Atorvastatin Calcium) .... Take One Daily 7)  Omeprazole 40 Mg Cpdr (Omeprazole) .... Take One Daily 8)  Imdur 30 Mg Xr24h-Tab (Isosorbide Mononitrate) .... Take One Daily 9)  Furosemide 20 Mg Tabs (Furosemide) .... Take One Daily 10)  Lisinopril 10 Mg Tabs (Lisinopril) .... Take One Daily 11)  Aspir-Low 81 Mg Tbec (Aspirin) .... Take One Daily 12)  Paxil 30 Mg Tabs (Paroxetine Hcl) .... Take One Daily 13)  Amlodipine Besylate 10 Mg Tabs (Amlodipine Besylate) .... Take One Daily  Past History:  Past Medical History: Last updated: 02/04/2010  1. Probable new onset of left subcortical stroke.   2. History of cerebrovascular disease in the past.   3. Rectal carcinoma status post resection.   4. Hypertension.   5. Cocaine abuse.   6. Myocardial infarction this admission.   7. History of dyslipidemia.   8. Chronic renal failure.   9. Tonsillectomy.   10.Hysterectomy.   Review of Systems       see history of present illness  Vital Signs:  Patient profile:   65 year old female Height:      67 inches Pulse rate:   64 / minute Pulse rhythm:   regular BP sitting:   126 / 66  (right arm)  Vitals Entered By: Jacquelin Hawking, CMA (February 05, 2010 10:03 AM)  Physical Exam  General:   Well-nournished, in no acute distress. Neck: No JVD, HJR, Bruit, or thyroid enlargement Lungs: No tachypnea, clear without wheezing, rales, or rhonchi Cardiovascular: RRR, PMI not displaced, heart sounds normal, no murmurs, gallops, bruit, thrill, or heave. Abdomen: BS normal. Soft without organomegaly, masses, lesions or tenderness. Extremities: without cyanosis, clubbing or edema. Good distal pulses bilateral SKin: Warm, no lesions or rashes  Musculoskeletal: No deformities Neuro: difficult speech, ambulating    EKG  Procedure date:   02/05/2010  Findings:      normal sinus rhythm with nonspecific ST-T wave changes  Impression & Recommendations:  Problem # 1:  MYOCARDIAL INFARCTION (ICD-410.90) Patient suffered a perioperative non-ST elevation MI while undergoing surgery for rectal carcinoma. She was found to be positive for cocaine. She also had a CVA. She was treated medically. 2-D echo showed ejection fraction to be 55% with distal inferior and apical akinesis and diastolic dysfunction. Patient remains asymptomatic. I will order an adenosine Myoview to rule out ischemia. I had a long discussion about the importance of avoiding drugs. The following medications were removed from the medication list:    Amlodipine Besylate 10 Mg Tabs (Amlodipine besylate) .Marland Kitchen... Take one daily Her updated medication list for this problem includes:    Carvedilol 12.5 Mg Tabs (Carvedilol) .Marland Kitchen... Take one tablet by mouth twice a day    Imdur 30 Mg Xr24h-tab (Isosorbide mononitrate) .Marland Kitchen... Take one daily    Lisinopril 10 Mg Tabs (Lisinopril) .Marland Kitchen... Take one daily    Aspir-low 81 Mg Tbec (Aspirin) .Marland Kitchen... Take one daily  Orders: EKG w/ Interpretation (93000) Nuclear Stress Test (Nuc Stress Test) Echocardiogram (Echo)  Problem # 2:  CVA (ICD-434.91) Patient is being followed by neurologist, and saw him 2 days ago. Her updated medication list for this problem includes:    Aspir-low 81 Mg Tbec (Aspirin) .Marland Kitchen... Take one daily  Problem # 3:  HYPERTENSION (ICD-401.9) Blood pressure stable The following medications were removed from the medication list:    Clonidine Hcl 0.2 Mg Tabs (Clonidine hcl) .Marland Kitchen..Marland Kitchen Two times a day    Avapro 150 Mg Tabs (Irbesartan) ..... Once daily    Furosemide 20 Mg Tabs (Furosemide) .Marland Kitchen... Take one daily    Amlodipine Besylate 10 Mg Tabs (Amlodipine besylate) .Marland Kitchen... Take one daily Her updated medication list for this problem includes:    Carvedilol 12.5 Mg Tabs (Carvedilol) .Marland Kitchen... Take one tablet by mouth twice a day     Lisinopril 10 Mg Tabs (Lisinopril) .Marland Kitchen... Take one daily    Aspir-low 81 Mg Tbec (Aspirin) .Marland Kitchen... 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 She has been advised to avoid this at all costs.  Problem # 5:  RENAL INSUFFICIENCY, CHRONIC (ICD-585.9) Patient's creatinines range from 1.7-2  Patient Instructions: 1)  Your physician recommends that you schedule a follow-up appointment in: 1 to 2 months with Dr. Eden Emms 2)  Your physician has requested that you have an adenosine myoview.  For further information please visit [email or web address]  Please follow instruction sheet, as given. 3)  Your physician has recommended you make the following change in your medication: Stop Furosemide, Amlodipine and Clonidine. 4)  Carvedilol 12.5 mg twice a day. You can take 1/2 tablet of the 25 mg Carvedilol until you get the 12.5 mg tablets. Prescriptions: CARVEDILOL 12.5 MG TABS (CARVEDILOL) Take one tablet by mouth twice a day  #  60 x 8   Entered by:   Ollen Gross, RN, BSN   Authorized by:   Marletta Lor, PA-C   Signed by:   Ollen Gross, RN, BSN on 02/05/2010   Method used:   Print then Give to Patient   RxID:   [id number]   Appended Document: EPH patient's blood pressure was low when she went to the neurologist office 80s systolic. They held her medications before she came to the office today. Because of this I will stop her clonidine,her furosemide and amlodipine.I have also decreased her carvedilol to 12.5 mg b.i.d. for bradycardia.

## 2010-12-09 NOTE — Letter (Signed)
Summary: Generic Letter  Architectural technologist, Main Office  [address]   [place], [place] [address]   Phone: [phone]  Fax: [phone]        Mar 10, 2010 MRN: [id number]    Sheila Flynn [address] [place], [place]  [address]    Dear Ms. DELPOZO,      I have been unable to reach you by phone regarding your stress test. Everything has come back normal. That means the blood flow into the heart muscle is normal and the heart is pumping strong. Please call with any questions or concerns.   Sincerely,  Deliah Goody, RN/Dr Charlton Haws  This letter has been electronically signed by your physician.

## 2010-12-09 NOTE — Assessment & Plan Note (Signed)
Summary: 1 month rov/sl  Medications Added NOVOLIN R 100 UNIT/ML SOLN (INSULIN REGULAR HUMAN) sliding sclae      Allergies Added: NKDA  CC:  no complaints.  History of Present Illness: Sheila Flynn is seen today as a new patient.  She was consulted on by the fellow on 2/3 for periop MI.  She had cocaine in her system and history of polysubstance abuse.  Intraop she had ST depression and eventually bumped her troponin to 9.0 range.  She had nonspecific ST/T wave changes and echo showed EF of 55%.  I reviewed her myovue that was ordered by our PA Herma Carson.  It was normal with no ischemia or infarct.  The patient had a colostomy and also appears to have suffered a CVA.  She was D/C to [facility].  She has significant HTN  and diabetes.  She has not had any SSCP, palpitations, dyspnea or edema.  She is hoping to go home eventually.  There are no plans to have her colostomy taken down for reanastomosis.  Givne her low risk myovue, asymptomatic state and normal EF I don't think further cardiac w.u is needed.  Hospital records from [place], [place] and [place] were all reviewed.  Progress note for [facility] written.    Cardiovascular Risk History:      Positive major cardiovascular risk factors include female age 64 years old or older, hyperlipidemia, and hypertension.        Positive history for target organ damage include ASHD (either angina; prior MI; prior CABG) and prior stroke (or TIA).    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 Take One Two Times A Day 2)  Carvedilol 12.5 Mg Tabs (Carvedilol) .... Take One Tablet By Mouth Twice A Day 3)  Lipitor  80 Mg Tabs (Atorvastatin Calcium) .... Take One Daily 4)  Imdur 30 Mg Xr24h-Tab (Isosorbide Mononitrate) .... Take One Daily 5)  Lisinopril 10 Mg Tabs (Lisinopril) .... Take One Daily 6)  Aspir-Low 81 Mg Tbec (Aspirin) .... Take One Daily 7)  Paxil 30 Mg Tabs (Paroxetine Hcl) .... Take One Daily 8)  Novolin R 100 Unit/ml Soln (Insulin Regular Human) .... Sliding Sclae  Allergies (verified): No Known Drug Allergies  Past History:  Past Medical History: Last updated: 02/04/2010  1. Probable new onset of left subcortical stroke.   2. History of cerebrovascular disease in the past.   3. Rectal carcinoma status post resection.   4. Hypertension.   5. Cocaine abuse.   6. Myocardial infarction this admission.   7. History of dyslipidemia.   8. Chronic renal failure.   9. Tonsillectomy.   10.Hysterectomy.   Past Surgical History: Last updated: 02/04/2010 Tonsillectomy.  Marland KitchenHysterectomy.   Family History: Last updated: 02/04/2010  Notable that both parents have passed away.   Mother was in her 55s, cause of death is unknown.  Father has passed   away of unknown causes.  There is some family history of diabetes and   hypertension.  Patient has four brothers, one sister.  One brother died   with cancer.      Social History: Last updated: 02/04/2010 [place], is  single, has three children who are alive and well.  The patient   is not employed.   Review of Systems       Denies fever, malais, weight loss, blurry vision, decreased visual acuity, cough, sputum, SOB, hemoptysis, pleuritic pain, palpitaitons, heartburn, abdominal pain, melena, lower extremity edema, claudication, or rash.   Vital Signs:  Patient profile:   65 year old female Height:      67 inches Weight:      159 pounds BMI:     24.99 Pulse rate:   68 / minute Resp:     14 per minute BP sitting:   130 / 70  (left arm)  Vitals Entered By: Kem Parkinson (Apr 01, 2010 2:47 PM)  Physical Exam  General:  Affect appropriate Healthy:  appears stated age HEENT: normal Neck supple with no adenopathy JVP normal no bruits no thyromegaly Lungs clear with no wheezing and good diaphragmatic motion Heart:  S1/S2 no murmur,rub, gallop or click PMI normal Abdomen: benighn, BS positve, no tenderness, no AAA no bruit.  No HSM or HJR Distal pulses intact with no bruits No edema Neuro non-focal Skin warm and dry Colostomy LLQ Slow speech   Impression & Recommendations:  Problem # 1:  MYOCARDIAL INFARCTION (ICD-410.90) No CP low risk myovue  Continue ASA and BB no indicatoin for cath especially given recent CVA Her updated medication list for this problem includes:    Carvedilol 12.5 Mg Tabs (Carvedilol) .Marland Kitchen... Take one tablet by mouth twice a day    Imdur 30 Mg Xr24h-tab (Isosorbide mononitrate) .Marland Kitchen... Take one daily    Lisinopril 10 Mg Tabs (Lisinopril) .Marland Kitchen... Take one daily    Aspir-low 81 Mg Tbec (Aspirin) .Marland Kitchen... Take one daily  Problem # 2:  HYPERLIPIDEMIA-MIXED (ICD-272.4) Continue statin F/U labs per primary The following medications were removed from the medication list:    Crestor 40 Mg Tabs (Rosuvastatin calcium) .Marland Kitchen... Take one daily Her updated medication list for this problem includes:    Lipitor 80 Mg Tabs (Atorvastatin calcium) .Marland Kitchen... Take one daily  CHOL: 327 (12/04/2009)   LDL: 255 (12/04/2009)   HDL: 39 (12/04/2009)   TG: 166 (12/04/2009)  Problem # 3:  CVA (ICD-434.91) No carotid disease.  Likely HTN in setting of cocaine.  Deficits improveing.  F/U neuro and primary Her updated medication list for this problem includes:    Aspir-low 81 Mg Tbec (Aspirin) .Marland Kitchen... Take one daily  Problem # 4:  HYPERTENSION (ICD-401.9) Well controlled continue current meds Her updated medication list for this problem includes:    Carvedilol 12.5 Mg Tabs (Carvedilol) .Marland Kitchen... Take one tablet by mouth twice a day    Lisinopril 10 Mg Tabs (Lisinopril)  .Marland Kitchen... Take one daily    Aspir-low 81 Mg Tbec (Aspirin) .Marland Kitchen... Take one daily  Cardiovascular Risk Assessment/Plan:      The patient's hypertensive risk group is category C: Target organ damage and/or diabetes.  Today's blood pressure is 130/70.       EKG Report  Procedure date:  04/01/2010

## 2010-12-09 NOTE — Letter (Signed)
Summary: [facility]   Imported By: Sherian Rein 11/21/2009 09:05:13  _____________________________________________________________________  External Attachment:    Type:   Image     Comment:   External Document

## 2010-12-09 NOTE — Letter (Signed)
Summary: [facility]   Imported By: Lester Chemung 03/12/2010 11:24:35  _____________________________________________________________________  External Attachment:    Type:   Image     Comment:   External Document

## 2010-12-09 NOTE — Letter (Signed)
Summary: [facility] - Outpatient Coinsurance Notice  [facility] - Outpatient [facility]   Imported By: Marylou Mccoy 03/06/2010 16:29:05  _____________________________________________________________________  External Attachment:    Type:   Image     Comment:   External Document

## 2010-12-09 NOTE — Progress Notes (Signed)
Summary: Nuc. Pre-Procedure  Phone Note Outgoing Call   Call placed by: Irean Hong, RN,  March 03, 2010 3:07 PM Summary of Call: Reviewed information on Myoview Information Sheet (see scanned document for further details).  Spoke with caregiver.      Nuclear Med Background Indications for Stress Test: Evaluation for Ischemia   History: Abnormal EKG, Echo, Myocardial Infarction  History Comments: 2/11 Echo: EF=55%, distal inferior and apical AK. 12/12/09 MI (NSTEMI) during surg. (+) for cocaine.     Nuclear Pre-Procedure Cardiac Risk Factors: CVA, Hypertension, Lipids Height (in): 67

## 2010-12-09 NOTE — Progress Notes (Signed)
Summary: cancel echo   Phone Note Outgoing Call   Call placed by: Sherri Rad, RN, BSN,  February 05, 2010 3:10 PM Summary of Call: The pt was seen today by Herma Carson- PA. She sent a message to the Triage desktop asking [place] to cancell the pt's echo since she had found one in the hospital. I attempted to call the pt and let her know this, but her contact # has been temporarily d/c'ed. I will go ahead and cancel her echo. Initial call taken by: Sherri Rad, RN, BSN,  February 05, 2010 3:12 PM

## 2010-12-09 NOTE — Assessment & Plan Note (Signed)
Summary: Cardiology Nuclear Study  Nuclear Med Background Indications for Stress Test: Evaluation for Ischemia   History: Abnormal EKG, Echo, Myocardial Infarction  History Comments: 2/11 Echo:EF=55%, distal inferior and apical AK; 12/12/09 NSTEMI during surgery,  (+) for cocaine.  Symptoms: Dizziness    Nuclear Pre-Procedure Cardiac Risk Factors: CVA, History of Smoking, Hypertension, Lipids Caffeine/Decaff Intake: None NPO After: 7:00 PM Lungs: Clear.  O2 Sat 94% on RA. IV 0.9% NS with Angio Cath: 22g     IV Site: (R) Hand IV Started by: Irean Hong RN Chest Size (in) 36     Cup Size B     Height (in): 67 Weight (lb): 159 BMI: 24.99  Nuclear Med Study 1 or 2 day study:  1 day     Stress Test Type:  Eugenie Birks Reading MD:  Marca Ancona, MD     Referring MD:  Charlton Haws, MD Resting Radionuclide:  Technetium 53m Tetrofosmin     Resting Radionuclide Dose:  10.6 mCi  Stress Radionuclide:  Technetium 65m Tetrofosmin     Stress Radionuclide Dose:  33.0 mCi   Stress Protocol   Lexiscan: 0.4 mg   Stress Test Technologist:  Rea College CMA-N     Nuclear Technologist:  Domenic Polite CNMT  Rest Procedure  Myocardial perfusion imaging was performed at rest 45 minutes following the intravenous administration of Myoview Technetium 39m Tetrofosmin.  Stress Procedure  The patient received IV Lexiscan 0.4 mg over 15-seconds.  Myoview injected at 30-seconds.  There were no significant changes with lexiscan; occasional PVC's.  Quantitative spect images were obtained after a 45 minute delay.  QPS Raw Data Images:  Normal; no motion artifact; normal heart/lung ratio. Stress Images:  NI: Uniform and normal uptake of tracer in all myocardial segments. Rest Images:  Normal homogeneous uptake in all areas of the myocardium. Subtraction (SDS):  There is no evidence of scar or ischemia. Transient Ischemic Dilatation:  1.21  (Normal <1.22)  Lung/Heart Ratio:  .23  (Normal  <0.45)  Quantitative Gated Spect Images QGS cine images:  not gated-ectopy   Overall Impression  Exercise Capacity: Lexiscan study.  BP Response: Normal blood pressure response. Clinical Symptoms: None ECG Impression: No significant ST segment change suggestive of ischemia. + PVCs.  Overall Impression: Normal stress nuclear study.  Appended Document: Cardiology Nuclear Study normal nuclear  Appended Document: Cardiology Nuclear Study phone disconnected, letter of results sent to pt

## 2010-12-10 ENCOUNTER — Encounter (HOSPITAL_COMMUNITY): Payer: Self-pay | Admitting: Internal Medicine

## 2010-12-10 ENCOUNTER — Inpatient Hospital Stay (HOSPITAL_COMMUNITY): Payer: Medicaid Other

## 2010-12-10 LAB — COMPREHENSIVE METABOLIC PANEL
Alkaline Phosphatase: 74 U/L (ref 39–117)
BUN: 33 mg/dL — ABNORMAL HIGH (ref 6–23)
CO2: 29 mEq/L (ref 19–32)
Calcium: 8.4 mg/dL (ref 8.4–10.5)
GFR calc non Af Amer: 34 mL/min — ABNORMAL LOW (ref >60)
Glucose, Bld: 134 mg/dL — ABNORMAL HIGH (ref 70–99)
Total Protein: 6.5 g/dL (ref 6.0–8.3)

## 2010-12-10 LAB — CBC
HCT: 30.9 % — ABNORMAL LOW (ref 36.0–46.0)
Hemoglobin: 9.8 g/dL — ABNORMAL LOW (ref 12.0–15.0)
MCHC: 31.7 g/dL (ref 30.0–36.0)
MCV: 82.8 fL (ref 78.0–100.0)
RDW: 15 % (ref 11.5–15.5)

## 2010-12-10 LAB — DIFFERENTIAL
Eosinophils Relative: 2 % (ref 0–5)
Lymphocytes Relative: 10 % — ABNORMAL LOW (ref 12–46)
Lymphs Abs: 0.7 10*3/uL (ref 0.7–4.0)
Monocytes Absolute: 0.6 10*3/uL (ref 0.1–1.0)
Monocytes Relative: 8 % (ref 3–12)

## 2010-12-11 ENCOUNTER — Inpatient Hospital Stay (HOSPITAL_COMMUNITY): Payer: Medicaid Other

## 2010-12-11 ENCOUNTER — Other Ambulatory Visit (HOSPITAL_COMMUNITY): Payer: Medicaid Other

## 2010-12-11 LAB — COMPREHENSIVE METABOLIC PANEL
ALT: 61 U/L — ABNORMAL HIGH (ref 0–35)
AST: 51 U/L — ABNORMAL HIGH (ref 0–37)
Albumin: 2.7 g/dL — ABNORMAL LOW (ref 3.5–5.2)
Alkaline Phosphatase: 81 U/L (ref 39–117)
BUN: 27 mg/dL — ABNORMAL HIGH (ref 6–23)
Chloride: 104 mEq/L (ref 96–112)
Potassium: 3.8 mEq/L (ref 3.5–5.1)
Sodium: 141 mEq/L (ref 135–145)
Total Bilirubin: 0.7 mg/dL (ref 0.3–1.2)
Total Protein: 6.3 g/dL (ref 6.0–8.3)

## 2010-12-11 LAB — CBC
MCV: 83.1 fL (ref 78.0–100.0)
Platelets: 162 10*3/uL (ref 150–400)
RBC: 3.62 MIL/uL — ABNORMAL LOW (ref 3.87–5.11)
RDW: 15 % (ref 11.5–15.5)
WBC: 7.6 10*3/uL (ref 4.0–10.5)

## 2010-12-11 LAB — GLUCOSE, CAPILLARY: Glucose-Capillary: 172 mg/dL — ABNORMAL HIGH (ref 70–99)

## 2010-12-12 ENCOUNTER — Inpatient Hospital Stay (HOSPITAL_COMMUNITY): Payer: Medicaid Other

## 2010-12-12 LAB — CBC
HCT: 30.1 % — ABNORMAL LOW (ref 36.0–46.0)
Hemoglobin: 9.5 g/dL — ABNORMAL LOW (ref 12.0–15.0)
MCH: 26.1 pg (ref 26.0–34.0)
MCHC: 31.6 g/dL (ref 30.0–36.0)
MCV: 82.7 fL (ref 78.0–100.0)
RDW: 14.7 % (ref 11.5–15.5)

## 2010-12-12 LAB — BASIC METABOLIC PANEL
BUN: 20 mg/dL (ref 6–23)
CO2: 28 mEq/L (ref 19–32)
Calcium: 8.4 mg/dL (ref 8.4–10.5)
GFR calc non Af Amer: 43 mL/min — ABNORMAL LOW (ref >60)
Glucose, Bld: 122 mg/dL — ABNORMAL HIGH (ref 70–99)
Sodium: 139 mEq/L (ref 135–145)

## 2010-12-12 LAB — DIFFERENTIAL
Basophils Absolute: 0 10*3/uL (ref 0.0–0.1)
Eosinophils Relative: 4 % (ref 0–5)
Lymphocytes Relative: 9 % — ABNORMAL LOW (ref 12–46)
Lymphs Abs: 0.6 10*3/uL — ABNORMAL LOW (ref 0.7–4.0)
Monocytes Absolute: 0.4 10*3/uL (ref 0.1–1.0)
Monocytes Relative: 7 % (ref 3–12)
Neutro Abs: 5.3 10*3/uL (ref 1.7–7.7)

## 2010-12-14 LAB — CBC
HCT: 31.2 % — ABNORMAL LOW (ref 36.0–46.0)
MCV: 81.5 fL (ref 78.0–100.0)
RBC: 3.83 MIL/uL — ABNORMAL LOW (ref 3.87–5.11)
WBC: 6.4 10*3/uL (ref 4.0–10.5)

## 2010-12-14 LAB — BASIC METABOLIC PANEL
Chloride: 102 mEq/L (ref 96–112)
GFR calc Af Amer: 58 mL/min — ABNORMAL LOW (ref >60)
Potassium: 4 mEq/L (ref 3.5–5.1)
Sodium: 141 mEq/L (ref 135–145)

## 2010-12-14 LAB — APTT: aPTT: 34 seconds (ref 24–37)

## 2010-12-15 ENCOUNTER — Encounter (HOSPITAL_COMMUNITY): Payer: Self-pay

## 2010-12-15 ENCOUNTER — Ambulatory Visit (HOSPITAL_COMMUNITY)
Admit: 2010-12-15 | Discharge: 2010-12-15 | Disposition: A | Discharge status: Home / Self Care | Payer: Medicaid Other | Attending: Internal Medicine | Admitting: Internal Medicine

## 2010-12-15 LAB — CBC
Hemoglobin: 9.9 g/dL — ABNORMAL LOW (ref 12.0–15.0)
MCH: 26.4 pg (ref 26.0–34.0)
MCV: 81.1 fL (ref 78.0–100.0)
RBC: 3.75 MIL/uL — ABNORMAL LOW (ref 3.87–5.11)

## 2010-12-15 LAB — DIFFERENTIAL
Basophils Relative: 1 % (ref 0–1)
Lymphocytes Relative: 9 % — ABNORMAL LOW (ref 12–46)
Lymphs Abs: 0.6 10*3/uL — ABNORMAL LOW (ref 0.7–4.0)
Monocytes Relative: 9 % (ref 3–12)
Neutro Abs: 4.8 10*3/uL (ref 1.7–7.7)
Neutrophils Relative %: 77 % (ref 43–77)

## 2010-12-15 LAB — BASIC METABOLIC PANEL
CO2: 29 mEq/L (ref 19–32)
Calcium: 8.8 mg/dL (ref 8.4–10.5)
GFR calc Af Amer: 55 mL/min — ABNORMAL LOW (ref >60)
GFR calc non Af Amer: 46 mL/min — ABNORMAL LOW (ref >60)
Sodium: 139 mEq/L (ref 135–145)

## 2010-12-15 LAB — PROTIME-INR: Prothrombin Time: 15.5 seconds — ABNORMAL HIGH (ref 11.6–15.2)

## 2010-12-16 LAB — BASIC METABOLIC PANEL
CO2: 29 mEq/L (ref 19–32)
Calcium: 8.7 mg/dL (ref 8.4–10.5)
GFR calc Af Amer: 50 mL/min — ABNORMAL LOW (ref >60)
GFR calc non Af Amer: 41 mL/min — ABNORMAL LOW (ref >60)
Sodium: 140 mEq/L (ref 135–145)

## 2010-12-16 LAB — MAGNESIUM: Magnesium: 2.4 mg/dL (ref 1.5–2.5)

## 2010-12-19 NOTE — Consult Note (Signed)
NAME:  Sheila Flynn, Sheila Flynn            ACCOUNT NO.:  [id number]  MEDICAL RECORD NO.:  [id number]           PATIENT TYPE:  I  LOCATION:  1436                         FACILITY:  [facility]  PHYSICIAN:  Wilmon Arms. Corliss Skains, M.D. DATE OF BIRTH:  [birth year]  DATE OF CONSULTATION:  10/10/2011 DATE OF DISCHARGE:                                CONSULTATION   REQUESTING PHYSICIAN:  Conley Canal, M.D.  PRIMARY SURGEON:  Almond Lint, M.D.  PRIMARY CARE PHYSICIAN:  [facility].  REASON FOR CONSULTATION:  Goiter.  HISTORY OF PRESENT ILLNESS:  Sheila Flynn is a 65 year old black female with a history of hypertension, polysubstance abuse, colon cancer, chronic renal insufficiency, history of CVA and history of MI both after prior colon surgery in 2011 who currently seems somewhat sedated and unable to provide much history.  Most of her history is obtained from [facility].  Apparently yesterday, the patient's husband noticed she had some increasing shortness of breath.  He brought her to the emergency department for further evaluation.  She was also noted to have some altered mental status.  The patient is currently unable to tell me how long she has had shortness of breath.  She thinks it just started within the last couple days to a week.  The patient states that she has had a goiter for over 10 years.  She denies any dysphagia or discomfort.  Upon her admission, the patient did have a CT scan of the chest completed which revealed no pulmonary embolus.  However, she did have air space disease consistent with pneumonia involving the upper lobes and lower lobes bilaterally.  She was also found to have an enlarged thyroid gland which compressed on the trachea just below the thoracic inlet causing tracheal stenosis.  She also has evidence of an enlarged central pulmonary artery consistent with pulmonary arterial hypertension. Because of the above findings concerning the trachea, we were asked to evaluate the  patient for possible thyroidectomy.  REVIEW OF SYSTEMS:  Please see HPI.  Otherwise currently all other systems are negative except the patient does complain of back pain as she has a sacral fracture from a fall that she had recently.  FAMILY HISTORY:  Noncontributory to this case.  PAST MEDICAL HISTORY: 1. Hypertension. 2. Chronic renal insufficiency. 3. Colon cancer, status post APR. 4. Polysubstance abuse. 5. Postoperative NSTEMI as well as CVA.  PAST SURGICAL HISTORY: 1. APR. 2. Tonsillectomy. 3. Hysterectomy.  SOCIAL HISTORY:  Apparently, the patient lives with her husband.  She currently says that she does not smoke tobacco, drink alcohol, or do any illicit drugs.  However, she did come in and tested positive for opiates as well as amphetamines.  She does have a history of polysubstance abuse including cocaine.  ALLERGIES:  NKDA.  MEDICATIONS:  Per eCHART include: 1. Amlodipine 5 mg daily. 2. Atorvastatin 80 mg at bedtime. 3. Carvedilol 12.5 Mg b.i.d. 4. Vicodin 5/325 p.r.n. 5. Isosorbide mononitrate 30 mg daily. 6. Lisinopril 10 mg daily. 7. Paroxetine 30 mg daily. 8. Ranitidine 150 mg b.i.d.  PHYSICAL EXAMINATION:  GENERAL:  Sheila Flynn is a 65 year old black female who is currently seen somewhat sedated  and with an altered mental status, but in no acute distress. VITAL SIGNS:  Temperature 99.6, pulse 64, respirations 18, blood pressure 134/75, and she is 97% oxygen saturation on 50% Venti mask. HEENT:  Head is normocephalic, atraumatic.  Sclerae noninjected.  Pupils are equal, round, and reactive to light.  Ears and nose without any obvious masses or lesions.  No rhinorrhea.  However, she does have a Venti mask present, and therefore, because I did not take this off, I did not examine her mouth. NECK:  Trachea is midline.  She does have a palpable as well as visible goiter.  She currently does not have any stridor or wheezing. HEART:  Regular rate and  rhythm.  Normal S1-S2.  No gallops or rubs are noted.  However, she does have a slight murmur noted.  Otherwise, she does have palpable carotid rate and pedal pulses bilaterally. LUNGS:  Bilateral crackles but no wheezes, rhonchi, rales, or stridor noted.  Respiratory effort seems nonlabored on the 50% Venti mask. ABDOMEN:  Soft, nontender, and nondistended with active bowel sounds. She does have a colostomy noted on the left mid quadrant.  Her stoma is pink and viable. MUSCULOSKELETAL:  All 4 extremities are symmetrical with no cyanosis or clubbing. PSYCH:  The patient is arousable.  She does seem to be oriented x3. Otherwise, affect is very depressed and flat.  LABS AND DIAGNOSTICS:  Sodium 142, potassium 3.4, glucose 134, BUN 33, creatinine 1.55, total bilirubin 0.6, alkaline phosphatase 74, AST 45, and ALT 48.  White blood cell count is 7200, hemoglobin 9.8, hematocrit 30.9, and platelet count is 180,000.  CT scan of the chest reveals no PE.  She does have pneumonia in the upper and lower lobes bilaterally. She has a markedly enlarged thyroid gland which compresses the trachea below the thoracic inlet causing stenosis. IMPRESSION: 1. Bilateral upper and lower lobe pneumonias. 2. Altered mental status. 3. History of polysubstance abuse and now positive for amphetamines. 4. Chronic renal insufficiency. 5. Goiter. 6. History of non-ST-elevation myocardial infarction as well as     cerebrovascular accident postoperatively. 7. Coronary artery disease. 8. Sacral fracture.  PLAN:  The patient is currently not in good shape to undergo any type of surgical intervention as she has significant pneumonia and requiring 50% Venti mask to keep her oxygen saturations at a normal level.  I do not feel that her decrease in oxygen saturation is related to her goiter as she does not have any stridor or wheezing and appears to be in no respiratory distress.  Her trachea is also patent on her CT  scan despite some evidence of stenosis.  The patient is a high risk surgical candidate secondary to her prior NSTEMI as well as CVA after her last surgery in 2011.  Ideally, the patient needs to stop recreational drug abuse prior to any type of surgical intervention as this puts her at once again high risk for complications including heart attack or stroke. The goiter that the patient currently has does not appear to need urgent resection.  Therefore, we recommend the patient becoming stabilized and improving from her sacral fracture and her pneumonia and follow up with Dr. Donell Beers as an outpatient to consider elective resection.  I will discuss this with Dr. Corliss Skains, and he will make any further recommendations after his evaluation of the patient.     Letha Cape, PA   ______________________________ Wilmon Arms. Corliss Skains, M.D.    KEO/MEDQ  D:  12/10/2010  T:  12/10/2010  Job:  [id number]  cc:   Almond Lint, MD [address] [place] [address]  Conley Canal, MD  Electronically Signed by Barnetta Chapel PA on 12/17/2010 03:32:44 PM Electronically Signed by Manus Rudd M.D. on 12/19/2010 04:11:06 PM

## 2010-12-21 DIAGNOSIS — J189 Pneumonia, unspecified organism: Secondary | ICD-10-CM

## 2010-12-21 DIAGNOSIS — J9600 Acute respiratory failure, unspecified whether with hypoxia or hypercapnia: Secondary | ICD-10-CM

## 2010-12-21 HISTORY — DX: Pneumonia, unspecified organism: J18.9

## 2010-12-21 HISTORY — DX: Acute respiratory failure, unspecified whether with hypoxia or hypercapnia: J96.00

## 2010-12-23 NOTE — Discharge Summary (Signed)
  NAME:  Sheila Flynn, Sheila Flynn            ACCOUNT NO.:  [id number]  MEDICAL RECORD NO.:  [id number]           PATIENT TYPE:  O  LOCATION:  XRAY                         FACILITY:  [facility]  PHYSICIAN:  Baltazar Najjar, MD     DATE OF BIRTH:  [birth year]  DATE OF ADMISSION:  12/15/2010 DATE OF DISCHARGE:  12/15/2010                              DISCHARGE SUMMARY   ADDENDUM: Please refer to the detailed discharge summary dictated December 16, 2010 by Dr. Jeoffrey Massed.  HOSPITAL COURSE:  The patient seen and examined by me today.  She is alert, oriented x3, not in any distress.  Physical exam showed no new abnormalities.  Chest is clear to auscultation.  Cardiovascular; S1-S2, regular rhythm and rate.  Abdomen is soft and nontender.  Extremities showed no pedal edema.  Chart and vital signs reviewed.  The patient is stable for discharge to SNF today.  DISCHARGE MEDICATION:  The patient will be discharged in the same medication as per the discharge summary of December 16, 2010 except Augmentin.  I will discontinue Augmentin given the fact that the patient completed her course of antibiotic while she is in the hospital.  She was on Zosyn, and she was previously on Zithromax as well.  The patient is currently afebrile, and she will need a followup repeat chest x-ray in 6-8 weeks of discharge to reassess her lung infiltrates.  We will defer that to her PCP.  CONDITION ON DISCHARGE:  Improved/stable.  FOLLOWUP:  The patient to follow with her PCP at [facility] and to follow with Dr. Donell Beers from [facility].          ______________________________ Baltazar Najjar, MD     SA/MEDQ  D:  12/18/2010  T:  12/18/2010  Job:  [id number]  cc:   Nestor Ramp, MD [address] [place] [address]  Electronically Signed by Hannah Beat MD on 12/23/2010 08:22:07 PM

## 2010-12-29 NOTE — Discharge Summary (Signed)
NAME:  Sheila Flynn, Sheila Flynn            ACCOUNT NO.:  [id number]  MEDICAL RECORD NO.:  [id number]           PATIENT TYPE:  O  LOCATION:  XRAY                         FACILITY:  [facility]  PHYSICIAN:  Jeoffrey Massed, MD    DATE OF BIRTH:  1946/02/27  DATE OF ADMISSION:  12/15/2010 DATE OF DISCHARGE:  12/15/2010                        DISCHARGE SUMMARY - REFERRING   PRIMARY CARE PHYSICIAN:  The patient's primary care practitioner is at [facility].  PRIMARY SURGEON:  Sheila Lint, MD from [facility].  PRIMARY DISCHARGE DIAGNOSES: 1. Pneumonia with high suspicion for aspiration. 2. Altered mental status secondary to polypharmacy, most likely being     narcotics and also a component of toxic metabolic encephalopathy     secondary to above. 3. Acute hypoxic respiratory failure secondary to pneumonia, now     resolved. 4. Sacral fracture status post kyphoplasty. 5. Uncontrolled hypertension.  SECONDARY DISCHARGE DIAGNOSES: 1. History of coronary artery disease.2. History of cerebrovascular accident.  The patient has some residual     dysarthria and is only able to ambulate at home with the help of a     walker. 3. History of rectal carcinoma status post colectomy and has a     colostomy in place. 4. History of goiter. 5. Dyslipidemia. 6. History of mild stage II chronic kidney disease. 7. Long-standing history of polysubstance abuse.  Urinary drug screen     positive for amphetamines and opiates. 8. Depression.  DISCHARGE MEDICATIONS: 1. Augmentin 875 mg 1 tablet p.o. twice daily till December 18, 2010     and then discontinue. 2. Amlodipine 10 mg 1 tablet p.o. daily. 3. Aspirin 81 mg 1 tablet p.o. daily. 4. Coreg 25 mg 1 tablet p.o. twice daily. 5. Colace 100 mg 1 capsule p.o. 3 times a day. 6. Ensure 237 mL p.o. daily. 7. Imdur 60 mg 1 tablet p.o. daily. 8. Lisinopril 40 mg 1 tablet daily. 9. Crestor 40 mg 1 tablet p.o. daily at bedtime. 10.Tramadol 50 mg 1 tablet  q.6h. p.r.n. 11.Paxil 30 mg 1 tablet p.o. daily. 12.Ranitidine 150 mg 1 tablet p.o. twice daily.  CONSULTATIONS: 1. Wilmon Arms. Corliss Flynn, M.D. from [facility]. 2. Sheila Flynn, M.D. from [facility].  HISTORY OF PRESENT ILLNESS:  The patient is a very pleasant 65 year old black female with a past medical history of CVA, coronary artery disease, history of recent colectomy and has a colostomy in place, on chronic narcotics at home, came in to the hospital for altered mental status.  Per the history obtained by me later and per the patient's boyfriend, the patient received an extra dose or two of her narcotics mistakenly.  She was then brought in to the hospital for altered mental status and was found to have significant pneumonia and hypoxic.  She was then admitted to the hospitalist's service for further evaluation and treatment.  For further details, please see the history and physical dictated by Dr. Pearson Grippe on admission.  RADIOLOGICAL STUDIES: 1. CT angiogram of the chest showed no evidence of pulmonary embolism.     Enlarged central pulmonary arteries consistent with pulmonary     arterial hypertension.  Pneumonia involving the upper lobes and     lower lobes bilaterally.  Markedly enlarged thyroid gland     compresses the trachea just below the thoracic level causing     significant tracheal stenosis. 2. CT of the pelvis without contrast shows a nondisplaced bilateral     sacral ala fractures extending into the S1 foramina bilaterally. 3. MRI of the lumbosacral spine was negative for fracture at the     lumbar spine level.  Known sacral fractures. 4. CT of the head without contrast showed remote infarcts but no acute     intracranial findings.  Periventricular and corona radiata white     matter hypodensities are most compatible with chronic microvascular     ischemia. 5. CT of the cervical spine shows cervical spondylosis with osseous      foraminal narrowing bilaterally at C5-C6 and C6-C7 but no fracture     or acute subluxation.  PERTINENT LABORATORY DATA:  Urinary drug screen was positive for amphetamines and opiates.  BNP was 60.  TSH was 0.840.  Chemistries on discharge shows a creatinine of 1.31.  BRIEF HOSPITAL COURSE: 1. Pneumonia.  Given the patient's altered mental status, this     probably was aspiration pneumonia.  The patient was initially     started on Rocephin and Zithromax but was then converted to Zosyn.     Over the course of her hospital course, she has been persistently     afebrile and never had leukocytosis.  Initially, she was hypoxic     with initial requirement for bypass and subsequent Ventimask,     requiring 50% FiO2; however, this has now been tapered and the     patient now is on room air and is saturating well.  In any event,     the plan is to discharge this patient.  Her Zosyn will now be     transitioned to Augmentin, to continue till the February 9.  She     will need followup chest x-rays in 6 to 8 week's time to document     resolution of the infiltrates. 2. Hypoxic respiratory failure, this is more likely than not secondary     to the above, as the pneumonia was bilateral.  As noted above, she     initially required 50% FiO2 via Ventimask to maintain saturations.     As the clinical improvement occurred, the patient was then     transitioned to nasal cannula, and now, she is on room air and     maintaining her saturations around 95%. 3. Sacral fracture status post secondary to a fall.  The patient was     in exquisite pain, but we minimized the use of narcotics given     history of chronic narcotic dependence and polysubstance use as     well as altered mental status when she was brought in from     narcotics itself.  She was seen in consultation by Dr. Corliss Flynn     from [facility] and the plan was to do a kyphoplasty     when the patient was much stable from  pneumonia and respiratory     failure.  She did undergo a kyphoplasty on December 15, 2009.  Her     pain is still there but currently is much better than what it has     been for the past 2 to 3 days.  She has had numerous radiological  studies as noted above, including an MRI of the lumbosacral spine     and a CT scan of her pelvis.  She will require extensive physical     therapy services, as the patient is deconditioned as well.  She is     to continue pain control with Ultram and also continue use of donut     on a p.r.n. basis. 4. Uncontrolled hypertension.  The patient was noted to have elevated     blood pressures throughout the hospitalization.  This is probably     as a  response to pain as well.  In any event, her medications have     been increased as noted above.  The rounding physician at the     skilled nursing facility will need further optimization, as the     pain control is better, perhaps the patient can be down titrated in     terms of her antihypertensive regimen as well. 5. Dyslipidemia.  This is stable.  She is to continue Crestor. 6. Coronary artery disease.  This was stable.  She is to continue her     aspirin and statin. 7. History of rectal CA.  The patient is status post recent colectomy     and colostomy is in place.  She is to continue followup with Dr.     Donell Beers at [facility] within 1 to 2 weeks upon discharge from     the hospital. 8. History of goiter.  This was seen on the radiological studies,     namely the CT of her chest.  Although the CT of the chest does     state that there is some tracheal compression from the goiter.  The     patient is asymptomatic and does not have any stridor or any     respiratory issues.  However, she was seen in consultation with Dr.     Corliss Flynn from [facility] and they said this does not     require surgery urgently.  They recommended the patient to follow     up with Dr. Donell Beers as an outpatient for  an elective resection, so     this patient will require outpatient followup with Dr. Donell Beers     within 1 to 2 weeks upon discharge from the hospital. 9. Polysubstance abuse.  The patient has been counseled extensively by     me.  DISPOSITION:  We are currently awaiting a bed to be available at the skilled nursing facility for the patient to be discharged there.  FOLLOWUP INSTRUCTIONS: 1. The patient to follow up with her primary care practitioner at     [facility] within 1 to 2 weeks upon discharge from the skilled     nursing facility. 2. The patient to follow up with Dr. Donell Beers from [facility] within 1 to 2 weeks upon discharge from the hospital.  Total time spent 45 minutes.     Jeoffrey Massed, MD     SG/MEDQ  D:  12/16/2010  T:  12/16/2010  Job:  [id number]  cc:   Sheila Lint, MD [address] [place] [address]  Melvern Banker Fax: [phone]  Electronically Signed by Jeoffrey Massed  on 12/29/2010 04:12:25 PM

## 2011-01-25 LAB — COMPREHENSIVE METABOLIC PANEL
ALT: 8 U/L (ref 0–35)
AST: 11 U/L (ref 0–37)
CO2: 31 mEq/L (ref 19–32)
Chloride: 104 mEq/L (ref 96–112)
GFR calc Af Amer: 47 mL/min — ABNORMAL LOW (ref >60)
GFR calc non Af Amer: 39 mL/min — ABNORMAL LOW (ref >60)
Potassium: 4 mEq/L (ref 3.5–5.1)
Sodium: 142 mEq/L (ref 135–145)
Total Bilirubin: 0.6 mg/dL (ref 0.3–1.2)

## 2011-01-25 LAB — URINE MICROSCOPIC-ADD ON

## 2011-01-25 LAB — URINALYSIS, ROUTINE W REFLEX MICROSCOPIC
Glucose, UA: NEGATIVE mg/dL
Hgb urine dipstick: NEGATIVE
Specific Gravity, Urine: 1.03 (ref 1.005–1.030)

## 2011-01-25 LAB — CBC
RBC: 4.12 MIL/uL (ref 3.87–5.11)
WBC: 2.8 10*3/uL — ABNORMAL LOW (ref 4.0–10.5)

## 2011-01-25 LAB — DIFFERENTIAL
Basophils Absolute: 0 10*3/uL (ref 0.0–0.1)
Eosinophils Absolute: 0 10*3/uL (ref 0.0–0.7)
Eosinophils Relative: 2 % (ref 0–5)
Monocytes Absolute: 0.2 10*3/uL (ref 0.1–1.0)

## 2011-01-25 LAB — PROTIME-INR: Prothrombin Time: 13.3 seconds (ref 11.6–15.2)

## 2011-01-27 NOTE — H&P (Signed)
NAME:  Sheila Flynn, Sheila Flynn NO.:  [id number]  MEDICAL RECORD NO.:  [id number]          PATIENT TYPE:  INP  LOCATION:  0101                         FACILITY:  [facility]  PHYSICIAN:  Massie Maroon, MD        DATE OF BIRTH:  05-01-1946  DATE OF ADMISSION:  12/09/2010 DATE OF DISCHARGE:                             HISTORY & PHYSICAL   CHIEF COMPLAINT:  "I wasn't acting right."  HISTORY OF PRESENT ILLNESS:  A 65 year old female with a history of fall 1 week ago apparently on Vicodin, complained of some slight shortness of breath.  She did not appear to be breathing well.  Her husband thought that it might be because of the pain medicine that she had been taking. The patient was not febrile and her husband denies any cough, chest pain, palpitations, nausea, vomiting, diarrhea on her behalf.  In fact, she tended to be constipated.  She has not had apparently a good bowel movement over the last 3-4 days.  The patient was brought to the ED for evaluation for altered mental status probably secondary to narcotics, though her urine drug screen did show evidence of amphetamines.  The patient also was noted to be short of breath and a chest x-ray was unrevealing other than possible mild CHF and mild interstitial and perihilar airspace pulmonary edema.  A CT angio chest was performed and showed no evidence of pulmonary embolism; however, it did show airspace consolidation with air bronchograms in the lower lobes bilaterally.  The patient will be admitted for pneumonia as well as altered mental status secondary to narcotic medication.  The patient was also noted to have a goiter of the thyroid gland extending substernally with marked compression of the trachea, so she will also be admitted for goiter.  PAST MEDICAL HISTORY: 1. Hypertension. 2. Hyperlipidemia. 3. CVA. 4. Chronic renal failure with a history of baseline creatinine of 1.7     to 2 in December 2006. 5. Polysubstance  abuse. 6. Colon cancer.  PAST SURGICAL HISTORY: 1. Colon cancer resection. 2. Tonsillectomy. 3. Hysterectomy.  SOCIAL HISTORY:  The patient is married and does not smoke or drink.  FAMILY HISTORY:  Unknown and unable to obtain due to the patient being altered in terms of her mental status.  Her husband said that she does not have any family history of anything.  ALLERGIES:  No known drug allergies.  MEDICATIONS:  (This was taken from the ER med list), the patient is unable to give confirmation of any of the medications due to altered mental status. 1. Amlodipine 5 mg p.o. daily. 2. Atorvastatin 80 mg p.o. q.h.s. 3. Carvedilol 12.5 mg p.o. b.i.d. 4. Vicodin 5/325 p.r.n. 5. Isosorbide mononitrate 30 mg p.o. daily. 6. Lisinopril 10 mg p.o. daily. 7. Paroxetine 30 mg p.o. daily. 8. Ranitidine 150 mg p.o. b.i.d.  REVIEW OF SYSTEMS:  Unable to obtain due to the patient's altered mental status.  PHYSICAL EXAMINATION:  VITAL SIGNS:  Temperature 98.6, pulse 73, blood pressure is 104/58, pulse ox is 67%. HEENT:  Anicteric.  EOMI.  No nystagmus.  Pupils 1.5 mm, symmetric. Direct, consensual, and near  reflexes intact.  Mucous membranes slightly dry. NECK:  No JVD, no bruit. HEART:  Regular rate and rhythm, S1, S2. LUNGS:  Positive bibasilar crackles.  No wheezes. ABDOMEN:  Soft, nontender, nondistended.  Positive bowel sounds. EXTREMITIES:  No cyanosis, clubbing, or edema. SKIN:  No rashes.  LABORATORY DATA:  BNP 60.  Urinalysis negative.  Alcohol level less than 5.  Sodium 139, potassium 3.4, BUN 33, creatinine 1.67, AST 59, ALT 52. WBC 7.6, hemoglobin 10.7, platelet count 220.  Troponin I less than 0.05.  ABG; pH 7.447, PCO2 of 38.4, PO2 of 84.7.  Urine drug screen positive for opiates and amphetamines.  ASSESSMENT AND PLAN: 1. Pneumonia:  The patient will be treated for community-acquired     pneumonia with ceftriaxone 1 g IV daily along with Zithromax 500 mg     IV daily.   Xopenex 1.25 mg p.o. q.6 h. p.r.n. shortness of breath     and wheezing, O2 nasal cannula to keep O2 saturation greater than     90%. 2. Altered mental status, likely secondary to Vicodin and possibly     amphetamines. 3. Hypertension.  Continue carvedilol, lisinopril, and Imdur. 4. Hyperlipidemia.  Continue Lipitor. 5. History of coronary artery disease status post perioperative     myocardial infarction per Cardiology notes Apr 01, 2010:  Continue     aspirin and above medications. 6. Gastroesophageal reflux disease:  Continue Zantac. 7. Anxiety/depression:  Continue Paxil. 8. Deep venous thrombosis prophylaxis:  Lovenox.     Massie Maroon, MD     JYK/MEDQ  D:  12/09/2010  T:  12/09/2010  Job:  [id number]  cc:   Melvern Banker Fax: [phone]  Electronically Signed by Pearson Grippe MD on 01/27/2011 09:32:38 PM

## 2011-01-28 LAB — BASIC METABOLIC PANEL
BUN: 10 mg/dL (ref 6–23)
BUN: 11 mg/dL (ref 6–23)
BUN: 11 mg/dL (ref 6–23)
BUN: 12 mg/dL (ref 6–23)
BUN: 13 mg/dL (ref 6–23)
BUN: 14 mg/dL (ref 6–23)
BUN: 22 mg/dL (ref 6–23)
CO2: 26 mEq/L (ref 19–32)
CO2: 28 mEq/L (ref 19–32)
CO2: 28 mEq/L (ref 19–32)
CO2: 29 mEq/L (ref 19–32)
CO2: 31 mEq/L (ref 19–32)
Calcium: 8.3 mg/dL — ABNORMAL LOW (ref 8.4–10.5)
Calcium: 8.9 mg/dL (ref 8.4–10.5)
Chloride: 100 mEq/L (ref 96–112)
Chloride: 100 mEq/L (ref 96–112)
Chloride: 100 mEq/L (ref 96–112)
Chloride: 102 mEq/L (ref 96–112)
Chloride: 103 mEq/L (ref 96–112)
Chloride: 103 mEq/L (ref 96–112)
Chloride: 105 mEq/L (ref 96–112)
Creatinine, Ser: 1.08 mg/dL (ref 0.4–1.2)
Creatinine, Ser: 1.14 mg/dL (ref 0.4–1.2)
Creatinine, Ser: 1.2 mg/dL (ref 0.4–1.2)
Creatinine, Ser: 1.26 mg/dL — ABNORMAL HIGH (ref 0.4–1.2)
Creatinine, Ser: 1.3 mg/dL — ABNORMAL HIGH (ref 0.4–1.2)
Creatinine, Ser: 1.39 mg/dL — ABNORMAL HIGH (ref 0.4–1.2)
GFR calc Af Amer: 46 mL/min — ABNORMAL LOW (ref >60)
GFR calc Af Amer: 47 mL/min — ABNORMAL LOW (ref >60)
GFR calc Af Amer: 52 mL/min — ABNORMAL LOW (ref >60)
GFR calc Af Amer: >60 mL/min (ref >60)
GFR calc non Af Amer: 38 mL/min — ABNORMAL LOW (ref >60)
Glucose, Bld: 126 mg/dL — ABNORMAL HIGH (ref 70–99)
Glucose, Bld: 153 mg/dL — ABNORMAL HIGH (ref 70–99)
Glucose, Bld: 159 mg/dL — ABNORMAL HIGH (ref 70–99)
Potassium: 3.9 mEq/L (ref 3.5–5.1)
Potassium: 4.1 mEq/L (ref 3.5–5.1)
Potassium: 4.3 mEq/L (ref 3.5–5.1)
Potassium: 4.6 mEq/L (ref 3.5–5.1)

## 2011-01-28 LAB — CBC
HCT: 24.5 % — ABNORMAL LOW (ref 36.0–46.0)
HCT: 25.9 % — ABNORMAL LOW (ref 36.0–46.0)
HCT: 26.4 % — ABNORMAL LOW (ref 36.0–46.0)
HCT: 28.8 % — ABNORMAL LOW (ref 36.0–46.0)
HCT: 30.2 % — ABNORMAL LOW (ref 36.0–46.0)
HCT: 32.1 % — ABNORMAL LOW (ref 36.0–46.0)
Hemoglobin: 10.4 g/dL — ABNORMAL LOW (ref 12.0–15.0)
Hemoglobin: 11 g/dL — ABNORMAL LOW (ref 12.0–15.0)
Hemoglobin: 8.6 g/dL — ABNORMAL LOW (ref 12.0–15.0)
Hemoglobin: 9.3 g/dL — ABNORMAL LOW (ref 12.0–15.0)
Hemoglobin: 9.5 g/dL — ABNORMAL LOW (ref 12.0–15.0)
MCHC: 32.8 g/dL (ref 30.0–36.0)
MCHC: 32.9 g/dL (ref 30.0–36.0)
MCHC: 33.1 g/dL (ref 30.0–36.0)
MCHC: 33.2 g/dL (ref 30.0–36.0)
MCHC: 33.9 g/dL (ref 30.0–36.0)
MCHC: 34.1 g/dL (ref 30.0–36.0)
MCV: 84.4 fL (ref 78.0–100.0)
MCV: 84.7 fL (ref 78.0–100.0)
MCV: 85.4 fL (ref 78.0–100.0)
MCV: 85.5 fL (ref 78.0–100.0)
MCV: 85.7 fL (ref 78.0–100.0)
MCV: 85.9 fL (ref 78.0–100.0)
MCV: 86.1 fL (ref 78.0–100.0)
MCV: 86.2 fL (ref 78.0–100.0)
MCV: 86.7 fL (ref 78.0–100.0)
Platelets: 172 10*3/uL (ref 150–400)
Platelets: 228 10*3/uL (ref 150–400)
Platelets: 325 10*3/uL (ref 150–400)
Platelets: 329 10*3/uL (ref 150–400)
RBC: 2.75 MIL/uL — ABNORMAL LOW (ref 3.87–5.11)
RBC: 3.05 MIL/uL — ABNORMAL LOW (ref 3.87–5.11)
RBC: 3.24 MIL/uL — ABNORMAL LOW (ref 3.87–5.11)
RBC: 3.33 MIL/uL — ABNORMAL LOW (ref 3.87–5.11)
RBC: 3.53 MIL/uL — ABNORMAL LOW (ref 3.87–5.11)
RBC: 3.68 MIL/uL — ABNORMAL LOW (ref 3.87–5.11)
RBC: 3.73 MIL/uL — ABNORMAL LOW (ref 3.87–5.11)
RBC: 4.3 MIL/uL (ref 3.87–5.11)
RDW: 15.3 % (ref 11.5–15.5)
RDW: 16 % — ABNORMAL HIGH (ref 11.5–15.5)
RDW: 16.3 % — ABNORMAL HIGH (ref 11.5–15.5)
WBC: 11 10*3/uL — ABNORMAL HIGH (ref 4.0–10.5)
WBC: 6.3 10*3/uL (ref 4.0–10.5)
WBC: 7.5 10*3/uL (ref 4.0–10.5)
WBC: 9 10*3/uL (ref 4.0–10.5)

## 2011-01-28 LAB — GLUCOSE, CAPILLARY
Glucose-Capillary: 105 mg/dL — ABNORMAL HIGH (ref 70–99)
Glucose-Capillary: 108 mg/dL — ABNORMAL HIGH (ref 70–99)
Glucose-Capillary: 113 mg/dL — ABNORMAL HIGH (ref 70–99)
Glucose-Capillary: 114 mg/dL — ABNORMAL HIGH (ref 70–99)
Glucose-Capillary: 116 mg/dL — ABNORMAL HIGH (ref 70–99)
Glucose-Capillary: 116 mg/dL — ABNORMAL HIGH (ref 70–99)
Glucose-Capillary: 118 mg/dL — ABNORMAL HIGH (ref 70–99)
Glucose-Capillary: 126 mg/dL — ABNORMAL HIGH (ref 70–99)
Glucose-Capillary: 128 mg/dL — ABNORMAL HIGH (ref 70–99)
Glucose-Capillary: 130 mg/dL — ABNORMAL HIGH (ref 70–99)
Glucose-Capillary: 134 mg/dL — ABNORMAL HIGH (ref 70–99)
Glucose-Capillary: 141 mg/dL — ABNORMAL HIGH (ref 70–99)
Glucose-Capillary: 142 mg/dL — ABNORMAL HIGH (ref 70–99)
Glucose-Capillary: 151 mg/dL — ABNORMAL HIGH (ref 70–99)
Glucose-Capillary: 161 mg/dL — ABNORMAL HIGH (ref 70–99)
Glucose-Capillary: 164 mg/dL — ABNORMAL HIGH (ref 70–99)
Glucose-Capillary: 165 mg/dL — ABNORMAL HIGH (ref 70–99)
Glucose-Capillary: 165 mg/dL — ABNORMAL HIGH (ref 70–99)
Glucose-Capillary: 170 mg/dL — ABNORMAL HIGH (ref 70–99)
Glucose-Capillary: 206 mg/dL — ABNORMAL HIGH (ref 70–99)
Glucose-Capillary: 99 mg/dL (ref 70–99)

## 2011-01-28 LAB — CROSSMATCH

## 2011-01-28 LAB — CARDIAC PANEL(CRET KIN+CKTOT+MB+TROPI)
CK, MB: 3.1 ng/mL (ref 0.3–4.0)
CK, MB: 3.4 ng/mL (ref 0.3–4.0)
CK, MB: 64.3 ng/mL (ref 0.3–4.0)
CK, MB: 90.2 ng/mL (ref 0.3–4.0)
Relative Index: 1.5 (ref 0.0–2.5)
Relative Index: 2.5 (ref 0.0–2.5)
Relative Index: 9.1 — ABNORMAL HIGH (ref 0.0–2.5)
Total CK: 1298 U/L — ABNORMAL HIGH (ref 7–177)
Total CK: 220 U/L — ABNORMAL HIGH (ref 7–177)
Total CK: 825 U/L — ABNORMAL HIGH (ref 7–177)
Total CK: 996 U/L — ABNORMAL HIGH (ref 7–177)
Troponin I: 26.5 ng/mL (ref 0.00–0.06)
Troponin I: 27.51 ng/mL (ref 0.00–0.06)

## 2011-01-28 LAB — COMPREHENSIVE METABOLIC PANEL
ALT: 18 U/L (ref 0–35)
AST: 24 U/L (ref 0–37)
Albumin: 2.2 g/dL — ABNORMAL LOW (ref 3.5–5.2)
Alkaline Phosphatase: 46 U/L (ref 39–117)
CO2: 28 mEq/L (ref 19–32)
Chloride: 105 mEq/L (ref 96–112)
GFR calc Af Amer: 51 mL/min — ABNORMAL LOW (ref >60)
GFR calc non Af Amer: 42 mL/min — ABNORMAL LOW (ref >60)
Potassium: 4.1 mEq/L (ref 3.5–5.1)
Sodium: 137 mEq/L (ref 135–145)
Total Bilirubin: 0.4 mg/dL (ref 0.3–1.2)

## 2011-01-28 LAB — LIPID PANEL: VLDL: 24 mg/dL (ref 0–40)

## 2011-01-28 LAB — ABO/RH: ABO/RH(D): B POS

## 2011-01-28 LAB — HEPARIN LEVEL (UNFRACTIONATED)
Heparin Unfractionated: 0.32 IU/mL (ref 0.30–0.70)
Heparin Unfractionated: 0.34 IU/mL (ref 0.30–0.70)
Heparin Unfractionated: 0.35 IU/mL (ref 0.30–0.70)
Heparin Unfractionated: 0.43 IU/mL (ref 0.30–0.70)

## 2011-01-28 LAB — PHOSPHORUS
Phosphorus: 3.8 mg/dL (ref 2.3–4.6)
Phosphorus: 4.5 mg/dL (ref 2.3–4.6)

## 2011-01-28 LAB — RAPID URINE DRUG SCREEN, HOSP PERFORMED
Opiates: NOT DETECTED
Tetrahydrocannabinol: NOT DETECTED

## 2011-01-28 LAB — CREATININE, SERUM
Creatinine, Ser: 1.31 mg/dL — ABNORMAL HIGH (ref 0.4–1.2)
GFR calc non Af Amer: 41 mL/min — ABNORMAL LOW (ref >60)

## 2011-01-28 LAB — BUN: BUN: 28 mg/dL — ABNORMAL HIGH (ref 6–23)

## 2011-02-12 LAB — DIFFERENTIAL
Eosinophils Absolute: 0.1 10*3/uL (ref 0.0–0.7)
Eosinophils Relative: 2 % (ref 0–5)
Lymphs Abs: 1.1 10*3/uL (ref 0.7–4.0)
Monocytes Absolute: 0.4 10*3/uL (ref 0.1–1.0)
Monocytes Relative: 8 % (ref 3–12)

## 2011-02-12 LAB — URINALYSIS, ROUTINE W REFLEX MICROSCOPIC
Bilirubin Urine: NEGATIVE
Nitrite: NEGATIVE
Specific Gravity, Urine: 1.012 (ref 1.005–1.030)
Urobilinogen, UA: 1 mg/dL (ref 0.0–1.0)

## 2011-02-12 LAB — COMPREHENSIVE METABOLIC PANEL
ALT: 10 U/L (ref 0–35)
AST: 15 U/L (ref 0–37)
Albumin: 3.6 g/dL (ref 3.5–5.2)
CO2: 30 mEq/L (ref 19–32)
Calcium: 9.4 mg/dL (ref 8.4–10.5)
GFR calc Af Amer: 39 mL/min — ABNORMAL LOW (ref >60)
Sodium: 137 mEq/L (ref 135–145)
Total Protein: 8.5 g/dL — ABNORMAL HIGH (ref 6.0–8.3)

## 2011-02-12 LAB — CBC
MCHC: 32.9 g/dL (ref 30.0–36.0)
RBC: 5.27 MIL/uL — ABNORMAL HIGH (ref 3.87–5.11)
RDW: 15.7 % — ABNORMAL HIGH (ref 11.5–15.5)

## 2011-03-24 NOTE — Discharge Summary (Signed)
NAME:  Sheila Flynn, Sheila Flynn NO.:  [id number]   MEDICAL RECORD NO.:  [id number]          PATIENT TYPE:  OUT   LOCATION:  NUC                          FACILITY:  [facility]   PHYSICIAN:  Altha Harm, MDDATE OF BIRTH:  08-Jul-1946   DATE OF ADMISSION:  03/10/2008  DATE OF DISCHARGE:  03/11/2008                               DISCHARGE SUMMARY   DISCHARGE DISPOSITION:  Home.   DISCHARGE DIAGNOSES:  1. Low back pain  2. Syncope.  3. Hypotension.  4. Chronic renal insufficiency.  5. Bradycardia, now resolved.  6. Hypertensive urgency, now resolved.  7. Hyperlipidemia.   DISCHARGE MEDICATIONS:  1. Hydrochlorothiazide 25 mg p.o. daily.  2. Aspirin 81 mg p.o. daily.  3. Lovastatin 20 mg p.o. b.i.d.  4. Hydralazine 25 mg p.o. q.8h.  5. Norvasc 10 mg p.o. daily.  6. Potassium chloride 20 mEq p.o. daily.  7. Oxycodone 5 mg p.o. q.6h. p.r.n.   CONSULTANTS:  [facility].   PROCEDURE:  Stress Myoview which showed an ejection fraction of 55%, no  ischemic changes.   DIAGNOSTIC STUDIES:  1. Portable chest x-ray which showed mild cardiomegaly without acute      cardiopulmonary disease.  2. VQ scan that shows intermediate probability.  3. X-ray of the lumbar spine which showed mild degenerative changes      with probable degenerative disk disease at L5-S1.   PRIMARY CARE Alixandria Friedt:  [facility].   ALLERGIES:  No known drug allergies.   CODE STATUS:  Full code.   CHIEF COMPLAINT:  Low back pain.   HISTORY OF PRESENT ILLNESS:  Please see the H&P dictated by Dr. Madaline Savage for details of the HPI.   HOSPITAL COURSE:  1. Hypotension and syncope.  The patient arriving in the emergency      room had a blood pressure 87/51 and had had an episode of syncope.      It is felt that the syncope was secondary to the hypotension.  The      patient had been taking labetalol, clonidine, hydrochlorothiazide      and isosorbide mononitrate at home.  The patient's  medications were      held and she was fluid resuscitated.  The patient resolved her      hypotension and had no further episodes of hypotension while      hospitalized.  2. Hypertensive urgency.  The patient, during her hospitalization, had      blood pressures that escalated after her antihypertensive had been      discontinued.  The patient had medications titrated and is being      continued on those usual medications as discussed.  3. Bradycardia.  The patient had been on clonidine.  When it was      reinstituted, the patient had significant bradycardia.  It was felt      that it was secondary to medication effect.  However, cardiology      was consulted to see the patient and felt that the patient      warranted a stress test.  The patient underwent a stress test which  showed results as noted above.  The chronotropic medications were      discontinued and the patient recovered her heart rate without any      difficulty.  4. Cocaine use.  The patient had positive cocaine in her urine,      although she has vehemently denied recent cocaine use.  She has      been counseled against any further use and, in particular, beta      blocker has been discontinued at is contraindication of the patient      using cocaine.  5. Low back pain.  The patient came in with complaints of low back      pain.  Physical therapy was consulted and instructed the patient on      a home exercise program.  The patient has been ambulating without      any difficulty and will be discharged on a limited prescription for      oxycodone and is to follow up with her primary care physician for      any long term chronic pain management.  Otherwise, the patient was      stable while hospitalized and is being discharged on the above      dictated medications.   DIETARY RESTRICTIONS:  The patient should be on a 2 gram sodium, low  cholesterol diet.   PHYSICAL RESTRICTIONS:  Activity as tolerated.   FOLLOW  UP:  The patient to follow up with [facility] in one week.      Altha Harm, MD  Electronically Signed     MAM/MEDQ  D:  03/11/2008  T:  03/11/2008  Job:  [id number]

## 2011-03-24 NOTE — H&P (Signed)
NAME:  Sheila, Flynn NO.:  [id number]   MEDICAL RECORD NO.:  [id number]          PATIENT TYPE:  EMS   LOCATION:  ED                           FACILITY:  [facility]   PHYSICIAN:  Sheila Savage, MD        DATE OF BIRTH:  07-02-1946   DATE OF ADMISSION:  03/06/2008  DATE OF DISCHARGE:                              HISTORY & PHYSICAL   PRIMARY CARE PHYSICIAN:  [facility].  This patient is unassigned to [place].   CHIEF COMPLAINT:  Low back pain.   HISTORY OF PRESENT ILLNESS:  Sheila Flynn is a 65 year old lady with a  history of hypertension, polysubstance abuse, chronic renal failure who  comes to the ER with complaints of lower back pain.  She states the pain  started last night when she was cooking, it came on suddenly. She  describes it as a sharp pain and she states the pain waxes and wanes.  She denies any chest pain, any shortness of breath.  She states the pain  occasionally radiates to between her scapula.  At this time, she denies  having any pain.  When she came to the ER, apparently her blood pressure  was 87/61, after which her blood pressure has been normal.  Apparently,  she also passed out when she came to the ER, but at this time she denies  having any lightheadedness, any headaches, any nausea, any vomiting, or  any abdominal pain.   PAST MEDICAL HISTORY:  1. Hypertensive.  2. History of polysubstance abuse.  3. History of cerebrovascular accident.  4. Hyperlipidemia.  5. Chronic renal failure with a baseline creatinine of 1.7-2 in      December 2006.   PAST SURGICAL HISTORY:  1. Tonsillectomy.  2. Hysterectomy.   ALLERGIES:  No known drug allergies.   CURRENT MEDICATIONS:  She is on  1. Hydrochlorothiazide 25 mg daily.  2. Lexapro 40 mg daily.  3. Aspirin 81 mg daily.  4. Lovastatin 20 mg twice daily.  5. Clonidine 0.1 mg twice daily.  6. Imdur 30 mg daily.   SOCIAL HISTORY:  She denies smoking, alcohol, drug abuse.  She does have  a history  of marijuana and cocaine abuse in the past.   FAMILY HISTORY:  Noncontributory.   REVIEW OF SYSTEMS:  GENERAL:  She denies recent weight loss, weight  gain, no fever or chills.  HEENT:  No headaches.  No blurred vision or  sore throat.  CARDIOVASCULAR:  Denies chest pain, palpitations.  RESPIRATORY:  No shortness of breath or cough.  GI:  No abdominal pain,  nausea, vomiting, diarrhea, constipation.   PHYSICAL EXAMINATION:  GENERAL APPEARANCE:  She is alert and oriented  x3.  VITAL SIGNS:  Temperature 97.7, pulse rate 55 per minute, blood pressure  is 87/61, which subsequently increased to 164/66 and then to 156/87.  Her last blood pressure is 117/74, respiratory rate is 20 per minute,  oxygen 97% on room air.  HEENT: Head atraumatic, normocephalic.  Pupils bilaterally equal and  reactive to light.  Mucous membranes pink and moist.  NECK:  Supple.  No  JVD, no thyromegaly.  CARDIOVASCULAR:  S1 and S2 heard.  Regular rhythm.  CHEST:  Clear to auscultation.  ABDOMEN:  Soft.  Bowel sounds heard.  EXTREMITIES:  No clubbing, cyanosis, or edema.   Straight leg raising test was done, which is negative bilaterally.  There is mild at tenderness over the lumbar spine.   LABORATORY DATA:  White count 6.1, hemoglobin 12, platelets 213.  Sodium  140, potassium 3.3, chloride 104, bicarb 27, BUN 21, creatinine 1.74,  glucose of 151.  AST 38, ALT of 34, alkaline phosphatase 74.  D-dimer is  elevated at 1.46.  Troponin is less than 0.05, CK-MB is 1.1.   X-ray of the chest shows mild cardiomegaly, no acute cardiopulmonary  process.  VQ scan shows an intermediate probability for PE.   IMPRESSION:  1. Low back pain, likely mechanical low back pain.  2. Intermediate probability VQ scan.  3. Syncope.  4. Hypotension which has resolved.  5. Chronic renal failure.  6. Hypokalemia.  7. No history of hypertension.  8. Hyperlipidemia.   PLAN:  1. Intermediate probability VQ scan.  This is a  65 year old lady who      comes in with low back pain.  She does not have any chest pain.      She does not have shortness of breath.  Her oxygen saturation is 97-      98% on room air.  She did have a positive D-dimer and an      intermediate probability VQ scan for pulmonary embolism.  Her      pretest probability for a pulmonary embolism is not that high.  I      would get lower extremity Dopplers on her and if he had negative, I      would plan on discontinuing the Lovenox.  The Lovenox has already      been started by the emergency room doctors.  2. Low back pain.  Her physical exam suggests that this could likely      be mechanical low back pain.  I will get an x-ray of her lumbar      spine at this time.  3. Syncope.  She apparently had a low blood pressure of 87/61 and      apparently passed out.  At this time she does not have any      symptoms.  Her blood pressure has subsequently improved.  I will      continue to hydrate her gently.  I will hold her blood pressure      medications until her blood pressure comes up.  I will also get a      urine drug screen as she has a history of drug abuse in the past.  4. Hypokalemia.  She is on a diuretic at home.  This could likely be      the etiology of her hypokalemia.  We will      replace the potassium.  I will also get a random urine cortisol      level on her.  5. Chronic renal failure.  Her creatinine is at her baseline at this      time.  6. Hyperlipidemia.  Will continue her on the lovastatin.      Sheila Savage, MD  Electronically Signed     PKN/MEDQ  D:  03/06/2008  T:  03/06/2008  Job:  [id number]

## 2011-03-24 NOTE — Consult Note (Signed)
NAME:  Sheila Flynn, Sheila Flynn            ACCOUNT NO.:  [id number]   MEDICAL RECORD NO.:  [id number]          PATIENT TYPE:  INP   LOCATION:  1415                         FACILITY:  [facility]   PHYSICIAN:  Jesse Sans. Wall, MD, FACCDATE OF BIRTH:  06-Nov-1946   DATE OF CONSULTATION:  03/09/2008  DATE OF DISCHARGE:                                 CONSULTATION   SUMMARY OF HISTORY:  Sheila Flynn is a 65 year old African American  female who was transported via EMS to [facility] emergency room  secondary to back discomfort.  Sheila Flynn states that, while making a  sandwich at approximately 11:30 p.m. on the day of admission she  suddenly developed lower back aching pain that radiated into both her  shoulder blades.  This was associated with dizziness and diaphoresis.  She sat in a chair with slight improvement and then walked to the  bathroom without any changes.  Her son-in-law apparently called EMS  because he stated that she passed out.  However she does not recall any  of loss of consciousness.  She does not remember him calling EMS but  remembers their arrival to her home.  She denies prior occurrences,  recent accidents or injuries.   PAST MEDICAL HISTORY:  No known drug allergies.   MEDICATIONS PRIOR TO ADMISSION:  According to the H&P and med record  include HCTZ 25 mg daily, Lexapro 40 daily, aspirin 81 daily, lovastatin  20 b.i.d., clonidine 0.1 b.i.d., Imdur 30 daily, lisinopril 40 daily,  labetalol 100 b.i.d., although the patient is not sure of her specific  medications.  She denies missing any recent doses.   She has a history of hypertension for which she does not check her blood  pressure at home.  She has a history of a CVA when she was hospitalized  with bilateral pneumonia in December 2006.  This was also associated  with an admission blood pressure of 275/120.  She has residual right  lower extremity weakness.  She has a history of hyperlipidemia.  However, the last time that  I can see this was checked his in December  2006 showed a total cholesterol 192, triglycerides 134, HDL 29, LDL 136.  She has a history of chronic renal insufficiency with a baseline  creatinine between 1.7 and 2.  Last echocardiogram was when she was  hospitalized with pneumonia and a stroke in December 2006 showed an EF  of 70%, marked LVH, moderate severe MR.  Moderate TR, left atrial  enlargement, severe LVH without gradient.  She has a history of a T&A,  hysterectomy.  She specifically denies diabetes, myocardial infarction,  COPD, renal disorder, thyroid dysfunction, blood dyscrasias.   SOCIAL HISTORY:  She resides in [place] with a friend in an  apartment.  She has 3 children, 12 grandchildren, and 2 great  grandchildren.  She is unemployed, prior employed in the dry cleaning  business.  It is unclear how long she has not smoked or used alcohol.  She states that she has not used cocaine in 8 months.  She states that  she walks regularly but she cannot specify a  duration or distance.  She  states that she avoids salt.  She states that both her parents are  deceased.  She is not sure about her mother.  She thinks her father died  in the 34s with a myocardial infarction.  She has five brothers, 3 of  whom are deceased.  All their health issues are unknown.  She also has 1  sister are also unknown.   REVIEW OF SYSTEMS:  In addition to the above is notable for poor  dentition, glasses, chronic dyspnea on exertion on rare occasions.  She  specifically denies chest discomfort, shortness of breath, palpitations  with the above symptoms.  She does describe nocturia, postmenopausal,  aware of stress incontinence, generalized weakness, back arthralgias and  GERD which she describes a burning sensation in her epigastric region.   PHYSICAL EXAM:  GENERAL:  Well-nourished well-developed pleasant African  American female in no apparent distress.  Temperature is 97.5.  Blood pressure 130/69,  however, has varied  throughout her admission, pulse 54, respirations 20.  I and Os have been  approximately 3800 and 1600.  Admission weight 83 kg, 98% sat on room  air.  Telemetry has showed sinus bradycardia, occasional PAC, PVC and a  wide complex tachycardia.  Note, her heart rate has been as low at 35.  HEENT:  Is unremarkable except for poor dentition.  NECK:  Supple without thyromegaly, adenopathy, JVD or carotid bruits.  CHEST:  Symmetrical excursion.  Diminished breath sounds but clear to  auscultation.  HEART:  Slow regular rate and rhythm with a normal S1-S2.  I do  appreciate a 2/6 systolic murmur best appreciated in the left sternal  border.  However, I do not appreciate a S3 or S4.  All pulses are  symmetrical and intact.  SKIN:  Integument appears to be intact.  ABDOMEN:  Bowel sounds present without organomegaly, masses or  tenderness.  EXTREMITIES:  Negative cyanosis, clubbing or edema.  MUSCULOSKELETAL/NEURO:  Unremarkable.   CURRENT MEDICATIONS:  1. Include aspirin 81 mg.  2. Protonix 40 mg daily.  3. Mevacor 40 nightly.  4. Clonidine 0.1 b.i.d. was discontinued.  5. Hydrochlorothiazide 12.5 daily.  6. Norvasc 5 mg daily.  7. She has not been on her labetalol since admission.   Chest x-ray on admission showed mild cardiomegaly without acute  cardiopulmonary disease.  VQ scan was indeterminate probability for  pulmonary embolism.  Lumbar spine showed some degenerative changes in  the lumbar region and no acute.  EKG shows sinus bradycardia, normal  axis, normal intervals, LVH.  Old EKGs are not available for comparison.  H&H on admission was 12.0 and 35.1, normal indices, platelets 213,000,  WBCs 6.1, sodium 140, potassium 3.3, BUN 21, creatinine 1.74, glucose  151, normal LFTs.  TSH 0.392, D-dimer 1.46.  BNP 85.38, PTT 40.  PT  14.7.  CK MBs, relative indexes and troponins have been with normal  limits x3.  Cortisol level 6.2.  Urine drug screen was positive  for  cocaine.   IMPRESSION:  1. Back discomfort, probably musculoskeletal etiology.  2. Dizziness with possible syncope.  It is unclear based on the      patient's history and according to the EMS report, her vital signs      were stable and there is no mention of mental status changes on      their run sheet.  Initial blood pressure in the emergency room was      hypotensive.  Not sure if  this is a correct reading given her      subsequent readings throughout her emergency room stay and      admission.  3. Hypertension.  4. Asymptomatic sinus bradycardia.  5. Premature ventricular contractions and nonsustained V-tach.  6. Hypokalemia on admission.  Yesterday potassium was 4.0.  7. Cocaine use history as per past medical history.   DISPOSITION:  Echocardiogram has been performed, results are pending.  Given her continued hypertension, we will increase her  hydrochlorothiazide and her Norvasc and add potassium supplementation.  Will also check a  BMET today and magnesium to assure that this is within normal limits.  To assess for possible ischemia with question of possible syncope with  her PVCs and sinus bradycardia, we will perform a stress Myoview at  [facility], not only to look for ischemia but for chronotropic competence.  Dr. Daleen Squibb has reviewed the above and agrees.      Joellyn Rued, PA-C      Jesse Sans. Daleen Squibb, MD, [facility]  Electronically Signed    EW/MEDQ  D:  03/09/2008  T:  03/09/2008  Job:  [id number]   cc:   Melvern Banker  Fax: [phone]   Jesse Sans. Wall, MD, FACC  [address]  [address]  [place]  [place] [address]

## 2011-03-27 NOTE — H&P (Signed)
NAME:  Sheila Flynn, Sheila Flynn NO.:  [id number]   MEDICAL RECORD NO.:  [id number]          PATIENT TYPE:  EMS   LOCATION:  ED                           FACILITY:  [facility]   PHYSICIAN:  Hillery Aldo, M.D.   DATE OF BIRTH:  14-May-1946   DATE OF ADMISSION:  10/25/2005  DATE OF DISCHARGE:                                HISTORY & PHYSICAL   The patient is unassigned.   CHIEF COMPLAINT:  Cough, fatigue.   HISTORY OF PRESENT ILLNESS:  The patient is a 65 year old female with a past  medical history of poorly controlled hypertension, homelessness and  polysubstance abuse, who presents with 4-day history of a cough productive  of purulent sputum. She denies any chest pain, shortness of breath or fever.  Notably, she is fatigued and has difficulty staying awake to answer  questions. Upon initial evaluation in the emergency department, she was  found to have bilateral infiltrates on chest x-ray and is being admitted for  treatment of community-acquired pneumonia.   PAST MEDICAL HISTORY:  1.  Hypertension.  2.  Polysubstance abuse (history of cocaine, marijuana, and alcohol abuse).  3.  Hyperlipidemia.  4.  Left ventricular hypertrophy by 2-D echocardiogram on 08/2004 with an      ejection fraction 65-75%.  5.  History of tonsillectomy.  6.  History of hysterectomy.   FAMILY HISTORY:  The patient's father's medical history is unknown. The  patient's mother is alive and well.   SOCIAL HISTORY:  The patient is divorced. She told the nurses that she was  homeless but when I questioned her she says she is living with friends.  Despite this, she says she has not eaten anything for 5 days. She is a  lifelong nonsmoker. As noted above, she has a history of polysubstance  abuse. She is unemployed.   DRUG ALLERGIES:  No known drug allergies.   MEDICATIONS:  None currently, but has been on an ACE inhibitor and  hydrochlorothiazide as well as a statin in the past.   REVIEW OF  SYSTEMS:  As per HPI. All other systems reviewed and found to be  negative.   PHYSICAL EXAM:  VITAL SIGNS:  Temperature 97.8, pulse 88, respirations 16,  blood pressure 188/98, O2 saturation 98% on room air.  GENERAL: This is an unkempt female with poor hygiene who smells of urine.  She is somewhat somnolent.  HEENT: Normocephalic, atraumatic. PERRL. EOMI with arcus senilis  bilaterally. Sclerae nonicteric. Oropharynx reveals poor dentition. Moist  mucous membranes.  NECK: Supple, no thyromegaly, no lymphadenopathy, no jugular venous  distension.  CHEST: There are scattered rhonchi bilaterally. She does have a prominent  moist cough.  HEART: Tachycardiac rate with and regular rhythm. Hyperdynamic precordium.  ABDOMEN: Soft, nontender, nondistended with normoactive bowel sounds.  EXTREMITIES: No clubbing, edema, cyanosis.  SKIN: Warm and dry. No rashes.  NEUROLOGIC: The patient is lethargic but easily arousable. She answers  questions appropriately. She falls asleep easily, however. Nonfocal.   DATA REVIEWED:  Chest x-ray reveals bilateral lower lobe pneumonia.   LABORATORY DATA:  BNP is 50.1. White blood cell count is  8.1, hemoglobin  12.7, hematocrit 38.6, platelets 283 with an absolute neutrophil count 6.7.  Sodium is 136, potassium 3.5, chloride 100, bicarb 31, BUN 22, creatinine  1.8, glucose 113, calcium 8.8, total protein 6.9, albumin 3.0, alkaline  phosphatase 58, AST 18, ALT 13, total bilirubin 0.6, myoglobin 222, CK-MB  1.4, troponin I less than 0.05. Urinalysis is negative for nitrites and  ketones. There is a small amount of leukocyte esterase, many bacteria and 0  to 2 white blood cells.   ASSESSMENT/PLAN:  1.  Bilateral pneumonia: The patient is remarkably asymptomatic except for a      cough. Given her social circumstances, she will unlikely be able to      procure her medications and have a place to rest to recuperate from this      illness. We will admit her for IV  antibiotics, IV fluids, antitussives      p.r.n., and nutrition.  2.  Hypertensive urgency: The patient's blood pressure was recorded as high      as 275/120 on initial evaluation. Blood pressure currently is running      188/98. She did not receive any antihypertensive therapy in the ER so it      is unclear if first reading was spurious. We will put her on an ACE      inhibitor and clonidine to bring her blood pressure down. Would avoid      beta blocker until her urine drug screen is completed to ensure that she      is not currently abusing cocaine. We will adjust her antihypertensives      as needed. We would avoid diuretics until she is rehydrated.  3.  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 She would benefit      from ongoing therapy with an ACE inhibitor, but compliance will be a      major issue for her. She has poor access to care and poor social      circumstance. We will obtain a social work consult to help with these      issues and in obtaining her medications.  4.  Bacteruria: The patient is asymptomatic, however she does have      significant bacteruria. The antibiotics that she will be on for a      pneumonia should cover urinary tract infection.  5.  Polysubstance abuse: Check the patient's urine drug screen and alcohol      level. We will empirically put her on seizure precautions for withdrawal      and treat her with thiamine and folic acid. We will obtain social work      help for referral to an ADS a program if indicated.           ______________________________  Hillery Aldo, M.D.     CR/MEDQ  D:  10/25/2005  T:  10/26/2005  Job:  [id number]

## 2011-03-27 NOTE — Discharge Summary (Signed)
NAME:  Sheila Flynn, Sheila Flynn NO.:  [id number]   MEDICAL RECORD NO.:  [id number]          PATIENT TYPE:  INP   LOCATION:  5738                         FACILITY:  [facility]   PHYSICIAN:  Michaelyn Barter, M.D. DATE OF BIRTH:  [birth year]   DATE OF ADMISSION:  10/25/2005  DATE OF DISCHARGE:  11/03/2005                                 DISCHARGE SUMMARY   ADDENDUM:  For a more complete discharge dictation, please see that dictated  by Dr. Elliot Cousin on October 30, 2005.  I followed the patient on the  dates of November 02, 2005 to November 03, 2005.  The events that I will  discuss will only detail the events that occurred on those two particular  days.  By November 02, 2005, the patient stated that she felt much better.  Her cough had been decreasing, and there was no complaint of chest pain or  shortness of breath.   1.  With regard to bilateral pneumonia, the patient was maintained on      azithromycin, and she appeared to do well.  2.  With regard to the patient's hypertension, it appeared to be slightly      above goal.  However, the decision was made to monitor this.  3.  With regard to bradycardia, this appeared to be stable.  The patient was      asymptomatic secondary to the bradycardia.  Therefore this was      monitored.  4.  UTI.  The patient was maintained on antibiotics.  She remained afebrile,      and her white count remained normal.  On the date of discharge, November 03, 2005, the patient stated that she felt good, and she requested to be      discharged home.  She had no complaints, and appeared to be comfortable.      There were no signs of breathing difficulties.  Vitals:  Her temperature      was 98.3; heart rate 55; respirations 20; blood pressure 147/79; she      saturated 96% on room air.  The decision therefore was made to discharge      the patient home.   The patient was discharged home on the following medications:  1.  Folic acid 1 mg once  a day.  2.  Hydrochlorothiazide 25 mg 1 tablet once a day.  3.  Lisinopril 5 mg 1 tablet twice a day.  4.  Norvasc 10 mg 1 tablet once a day.  5.  Lipitor 10 mg 1 tablet daily.  6.  Clonidine 0.1 mg 1 tablet twice a day.  7.  Benadryl 25 mg p.o. q.8 h.   She was instructed to follow up with [facility] within 1-2 weeks.      Michaelyn Barter, M.D.  Electronically Signed     OR/MEDQ  D:  12/18/2005  T:  12/18/2005  Job:  [id number]

## 2011-03-27 NOTE — Discharge Summary (Signed)
NAME:  Sheila Flynn, Sheila Flynn NO.:  [id number]   MEDICAL RECORD NO.:  [id number]          PATIENT TYPE:  INP   LOCATION:  5530                         FACILITY:  [facility]   PHYSICIAN:  Hettie Holstein, D.O.    DATE OF BIRTH:  1946/04/05   DATE OF ADMISSION:  09/01/2004  DATE OF DISCHARGE:                                 DISCHARGE SUMMARY   This is a patient that is unassigned.  She will establish care at  [facility] following discharge.   ADMITTING DIAGNOSES:  Swelling of the lower extremity.   HISTORY OF PRESENT ILLNESS:  This is a recently incarcerated 65 year old  African-American female who presented with the above complaint of lower  extremity swelling and she was admitted to rule out deep venous thrombosis  as there was asymmetry with left being more swollen than right.   DISCHARGE DIAGNOSES:  1.  Left lower extremity swelling.  No evidence of deep venous thrombosis.      Status post evaluation with lower extremity Doppler studies.  2.  Poorly controlled hypertension due to medical noncompliance for      socioeconomic reasons.  We will be providing her with medications on      discharge.  3.  Cocaine abuse.  This admission patient reported positive abuse of      cocaine and had positive urine drug screen.  She did request social      service assistance in seeking substance abuse counseling.  Numbers were      provided.  4.  Abnormal EKG status post cycling of cardiac markers without evidence of      acute ischemic event.  5.  Hypokalemia status post repletion.  6.  Hyperlipidemia with LDL of 161 and HDL of 34.  7.  Homelessness.   STUDIES PERFORMED THIS ADMISSION:  Patient underwent lower extremity  Dopplers as noted above.  In addition, she underwent 2-D echocardiogram  which did reveal normal left ventricular ejection fraction of 65-75%.  There  appeared to be evidence of left ventricular hypertrophy.   DISCHARGE MEDICATIONS:  1.  HCTZ 50 mg daily.  2.   Accupril 10 mg daily.  3.  Aspirin 81 mg daily.  4.  Lipitor 40 mg daily.  5.  It is recommended the patient be discharged on a beta blocker in      addition.  However, her history of cocaine abuse elevates concern with      regard to this prescription.  Will recommend this to be started in the      outpatient setting.  If she can establish close follow-up with      [facility] this would be reasonable to start.   HISTORY OF PRESENTING ILLNESS:  This is a 65 year old female with a history  of hypertension off of medications x1 month who had been incarcerated prior  to presentation and had received her medications through the prison system.  She states she came into the emergency room secondary to progressive  swelling of her lower extremities days prior to admission.   HOSPITAL COURSE:  Patient was admitted, placed on antihypertensive  medications, placed  on telemetry.  She underwent lower extremity Dopplers  without evidence of a DVT.  In addition, she underwent cycling of cardiac  enzymes without evidence of acute ischemia event.  Otherwise, her hospital  course was unremarkable and we are awaiting social service assistance and  disposition.   FINAL LABORATORY DATA:  Sodium 138, potassium 4.4, BUN 11, creatinine 1.0.  2-D echocardiogram as noted above.  Chest x-ray that was unremarkable for  acute process.  She had some mild cardiomegaly.  Otherwise, no other  significant findings.       ESS/MEDQ  D:  09/03/2004  T:  09/03/2004  Job:  [id number]   cc:   Dala Dock

## 2011-03-27 NOTE — Discharge Summary (Signed)
Sheila Flynn, Sheila Flynn            ACCOUNT NO.:  [id number]   MEDICAL RECORD NO.:  [id number]          PATIENT TYPE:  INP   LOCATION:  5738                         FACILITY:  [facility]   PHYSICIAN:  Elliot Cousin, M.D.    DATE OF BIRTH:  [birth year]   DATE OF ADMISSION:  10/26/2005  DATE OF DISCHARGE:                                 DISCHARGE SUMMARY   INTERIM DISCHARGE SUMMARY:  THIS IS AN INTERIM DISCHARGE SUMMARY.   DISCHARGE DIAGNOSES:  1.  Bilateral pneumonia.  2.  Severe/malignant hypertension.  3.  Left ventricular hypertrophy with moderate to severe mitral valve      regurgitation per 2D echocardiogram.  4.  Multiple lacunar strokes bilaterally with probable subacute left lacunar      stroke.      1.  Residual right sided hemiparesis and dysarthria.  5.  Escherichia coli urinary tract infection.  6.  Chronic kidney disease, probably secondary to hypertensive      nephrosclerosis.  7.  Elevated homocystine.  8.  Cocaine abuse.  9.  Homeless.  10. Mild normocytic anemia.  11. History of hyperlipidemia.  12. Status post tonsillectomy in the past.  13. Status post hysterectomy in the past.  14. History of marijuana and alcohol abuse.  15. Hyperlipidemia.   DISCHARGE MEDICATIONS:  Will be dictated at the time of hospital discharge.   CURRENT MEDICATIONS:  1.  Norvasc 10 mg daily.  2.  Aspirin 81 mg daily.  3.  Azithromycin 500 mg IV daily.  4.  Rocephin 1 gram IV daily.  5.  Tessalon Perles 100 mg b.i.d.  6.  Clonidine 0.1 mg b.i.d.  7.  Folic acid 1 mg daily.  8.  Guaifenesin/dextromethorphan 10 mg t.i.d.  9.  Hydrochlorothiazide 25 mg daily.  10. Imdur 30 mg daily.  11. Lisinopril 10 mg b.i.d.  12. Multivitamin with iron once daily.  13. Protonix 40 mg daily.  14. Thiamine 100 mg daily.   PROCEDURES PERFORMED:  1.  CT scan of the head without contrast on October 28, 2005.  The results      revealed remote lacunar infarctions involving the right lentiform  nucleus and the anterior aspect of the body of the right caudate nucleus      and adjacent right corona radiata.  Vague hypodensity in the left pons      compatible with lacunar infarction, age indeterminate.  No intracranial      hemorrhage.  Mucosal thickening involving the multiple ethmoid sinus air      cells and also the maxillary sinuses.  Diffuse chronic small vessel      ischemic disease.  2.  A 2D echocardiogram performed on October 28, 2005.  The results      revealed left ventricular systolic function estimated to be 70%.  No      left ventricular regional wall motion abnormalities.  Left ventricular      wall thickness was markedly increased.  Aortic valve thickness was      mildly increased.  Moderate thickening of the mitral valve.  Moderate to      severe mitral  valve regurgitation.  3.  Carotid Doppler study on October 28, 2005.  The results revealed mild      plaque noted throughout the right carotid artery.  On the left, mild      calcific plaque.  No hemodynamically significant ICA stenosis      bilaterally.  Vertebral artery flow antegrade.   HISTORY OF PRESENT ILLNESS:  The patient is a 65 year old lady with a past  medical history significant for poorly controlled hypertension,  noncompliance, homelessness, and polysubstance abuse, who presented to the  emergency department on October 25, 2005, with a four-day history of a  productive cough with purulent sputum and generalized fatigue.  Upon initial  evaluation in the emergency department, the patient was found to have  bilateral infiltrates on the chest x-ray.  The patient was therefore  admitted for further evaluation and management of bilateral pneumonia.  For  additional details, please see the dictated history and physical.   HOSPITAL COURSE:  1.  BILATERAL PNEUMONIA.  The patient was started on antibiotic therapy with      intravenous azithromycin and Rocephin.  Blood cultures and sputum      cultures were  ordered.  Sputum specimens were not obtained however.  The      patient's blood cultures have remained negative during the hospital      course so far.  The patient was also treated with as needed Guaifenesin      and Tessalon Perles.  She was also started on oxygen therapy via nasal      cannula.  As of today, the patient has completed five days of antibiotic      therapy with Rocephin and azithromycin.  She is currently afebrile and      her white blood cell count is within normal limits.  A repeat chest x-      ray, on October 30, 2005, revealed marked interval improvement in lung      aeration, however, there remains some residual changes associated with      the left lower lobe.  The chest x-ray also revealed mild diffuse      peribronchial thickening throughout the lungs.  The patient will      eventually be transitioned over to antibiotics by mouth.  She will need      to complete a seven to ten-day course of antibiotic therapy and then the      antibiotics can be discontinued.   1.  MALIGNANT HYPERTENSION/LVH (LEFT VENTRICULAR HYPERTROPHY)/MODERATE TO      SEVERE MITRAL VALVE REGURGITATION.  The patient's blood pressure was      noted to be 188/98 on admission.  During the first 24 hours of      hospitalization, however, the patient's blood pressures did increase to      over 200 systolically.  The patient was started on antihypertensive      therapy with Lisinopril 10 mg and clonidine 0.1 mg b.i.d.  Over the      course of the hospitalization, the Lisinopril was increased to 10 mg      b.i.d.  Hydrochlorothiazide at 25 mg daily was added as well.  In      addition, Norvasc at 10 mg daily was added.  The patient was also      started on a nitro paste at a 1/4 inch every six hours.  Gradually, the      patient's blood pressures did improve.  The nitro paste has been  discontinued.  The patient has been now started on Imdur.  Her blood     pressures now range between 130 and  160-mmHg pressure systolically.  The      patient's blood pressures will be followed closely so that her systolic      blood pressure will not become too low.  A 2D echocardiogram was ordered      for evaluation of stroke like symptoms as will be discussed below.  The      patient's 2D echocardiogram revealed severe to moderate mitral valve      regurgitation and moderate left ventricular wall thickening.  Obviously,      the patient's chronic cocaine abuse and uncontrolled/non-treated or      untreated hypertension have lead to these abnormalities. It remains to      be seen if she will be an appropriate candidate for consideration of      mitral valve repair or replacement.  After load reduction with      Lisinopril has been started as stated above and pre-load treatment has      been started with Imdur.  The patient's blood pressures will continue to      be monitored for further adjustments in antihypertensive medications.      The patient was mildly bradycardic during hospital course with a heart      rate ranging between 55 and 60.  The bradycardia was felt to be      secondary to severe hypertension.  A TSH will be ordered for further      evaluation.   1.  MULTIPLE LACUNAR STROKES.  The patient gave a history of worsening right      sided weakness, particularly of her right hand and her right leg.  Upon      further questioning, she has had intermittent weakness of her right hand      and right leg over the past week or two.  Given this history, a follow-      up neurological exam was performed.  On exam the patient was noted to      have dysarthria with a right sided facial droop which was somewhat mild      in nature.  In addition, the patient was found to have mild to moderate      hemiparesis of her right upper and right lower extremity.  Given these      findings, a CT scan of the head without contrast was ordered.  The CT      scan of the head revealed no intracranial  hemorrhage, however, it did      show multiple lacunar infarcts particularly on the right and a probable      subacute left lacunar infarct.  The CT scan also revealed chronic      ischemic changes. Aspirin therapy was started. The speech therapist,      occupational therapist, and physical therapist were consulted.  Per the      speech therapist's evaluation, the patient demonstrated no signs of      dysphagia or aspiration.  The physical therapist recommended ongoing      therapy as the patient appeared to be a little ataxic on the right with      ambulation with a walker.  The patient is right handed.  The      occupational therapist also made recommendations that would help the      patient with activities at home.  Inpatient  rehab would be the ideal      environment for the patient for ongoing rehabilitation.  However, the      patient is homeless and placement in an assisted living environment is     being pursued.  When placement is aquired,  occupational therapy and      physical therapy will be ordered.  The patient has a history of      homelessness with crack cocaine abuse and if she is discharged to a      skilled nursing facility, in essence,  she will have nowhere to go when      she has completed therapy.   For further evaluation, carotid Dopplers were ordered.  The carotid Doppler  study revealed no ICA stenosis.  The patient's fasting lipid panel was also  assessed and the results are as follows:  Total cholesterol 192,  triglycerides 134, HDL cholesterol 29, and LDL cholesterol 136.  Lipitor at  10 mg q.h.s. will be added today.  The patient's homocystine level was also  elevated at 18.2.  Folic acid at 1 mg daily will be added as well.   1.  E. COLI URINARY TRACT INFECTION.  An urinalysis was assessed on      admission.  The urinalysis was positive for leukocytes and bacteria.  A      culture was sent and it eventually grew out greater than 100,000      colonies of E.  coli.  The E. coli was sensitive to Rocephin.  The      patient was continued on treatment with Rocephin and azithromycin as      stated above.   1.  CHRONIC KIDNEY DISEASE.  The patient's BUN ranged between 20 and 14      during the hospital course.  Her creatinine ranged between 1.7 and 1.8      during hospitalization.  The patient probably has an element of kidney      disease secondary to hypertension.  As stated above, the patient was      placed on Lisinopril (ACE inhibitor therapy).   1.  COCAINE ABUSE.  The patient had been a client of [facility] in the past.  The      social worker provided the patient with encouragement and additional      information on alcohol/drug services.  The patient was encouraged by the      medical team to stop abusing cocaine as well as alcohol and marijuana.   1.  HOMELESSNESS/UNINSURED/UNEMPLOYED.  The case manager and social work      staff were consulted for assistance.  The patient has been failing to      thrive on the streets. She has a history of cocaine abuse.  Given the      patient's multiple comordid  conditions, it was felt that the patient      would not survive or thrive if she were to be discharged to a shelter      and/or with family.  Therefore, the staff has started searching for an      assisted living facility.  Hopefully, the patient will secure a bed at a      local assisted living facility where she will receive her medications on      a daily basis and hopefully will receive followup medical care.  She      will need ongoing physical therapy and occupational therapy once an      assisted  living facility bed has been acquired.   DISCHARGE DISPOSITION:  The exact date of hospital discharge is unclear at  this time.  The patient will probably be stable for hospital discharge in  the next two to three days following the Christmas holiday.  An assisted  living facility bed is currently pending.  The patient will need aggressive physical  therapy and occupational therapy at the assisted living facility.  The patient will also need ongoing counseling and followup therapy at  [facility].  This should be arranged as an outpatient.      Elliot Cousin, M.D.  Electronically Signed     DF/MEDQ  D:  10/30/2005  T:  10/30/2005  Job:  [id number]

## 2011-03-27 NOTE — H&P (Signed)
NAME:  Sheila Flynn, Sheila Flynn NO.:  [id number]   MEDICAL RECORD NO.:  [id number]          PATIENT TYPE:  EMS   LOCATION:  MAJO                         FACILITY:  [facility]   PHYSICIAN:  Hillery Aldo, M.D.   DATE OF BIRTH:  1946/05/31   DATE OF ADMISSION:  09/01/2004  DATE OF DISCHARGE:                                HISTORY & PHYSICAL   CHIEF COMPLAINT:  Swelling of the lower extremity.   HISTORY OF PRESENT ILLNESS:  The patient is a 65 year old African-American  female with a past medical history of hypertension who has not taken any  medication for approximately one month.  The patient had been incarcerated  prior to this and received her medication through the prison system.  The  patient claims that she came into the emergency department today secondary  to progressive swelling of the lower extremities for three days prior to  admission.  She denies any chest pain, shortness of breath, or cough.   ALLERGIES:  No known drug allergies.   MEDICATIONS:  The patient does not recall the name of the antihypertensive  she took while incarcerated.   PAST MEDICAL HISTORY:  1.  Hypertension.  2.  History of tonsillectomy.  3.  History of hysterectomy, unknown reasons.   FAMILY HISTORY:  The patient's mother is alive in her 27's.  Father is  deceased at an unknown age and of unknown causes.  She thinks there may be  some diabetes in the family, but she cannot tell me who suffers with it.   SOCIAL HISTORY:  The patient is divorced.  She currently lives by herself.  She has three children who are reportedly in good health.  She states she is  a lifelong nonsmoker.  She denies any alcohol or drugs.   REVIEW OF SYSTEMS:  The patient denies any fever or chills.  She has had  some hot flashes.  She has had some paresthesias in the fingers for the past  two to three months on the right hand.  Denies any vision changes,  dysphagia, loss of power to any muscles.  All other systems  are reviewed and  found to be negative.   PHYSICAL EXAMINATION:  VITAL SIGNS:  Temperature 98.8, pulse 95,  respirations 18, blood pressure 201/97, O2 saturation 100% on room air.  GENERAL:  Obese female with a flat affect.  HEENT:  Normocephalic and atraumatic.  PERRL.  EOMI.  Oropharynx is clear.  No tonsillar exudates or thrush.  Tongue is midline.  NECK:  Supple, no thyromegaly, no lymphadenopathy, no jugular venous  distention.  CHEST:  Lungs clear to auscultation bilaterally with good air movement.  HEART:  Regular rate and rhythm with a grade 2/6 systolic ejection murmur in  the left upper sternal border.  ABDOMEN:  Soft, nontender, nondistended with normal active bowel sounds.  EXTREMITIES:  The left lower extremity has 2+ edema and exquisite calf  tenderness.  The right lower extremity has 1+ edema.  SKIN:  Hot to touch.  No rashes.  NEUROLOGY:  The patient is alert and oriented.  She moves all extremities x4  with equal strength.  Cranial nerves II-XII grossly intact.   LABORATORY DATA:  CBC showed a white blood cell count of 6.7, hemoglobin  11.5, hematocrit 34.3, platelets 344, MCV 82.4.  D-dimer was 1.05.  Chemistries showed sodium 140, potassium 3.1, chloride 104, bicarb 38, BUN  15, creatinine 1.1, glucose 101.  Cardiac point of care markers are negative  x2 in the emergency department.   Chest x-ray shows a questionable nipple shadow versus nodule at the right  base.  There are no appreciable infiltrates or evidence of CHF.  She has a  tracheal fullness on the right that may be secondary to a goiter.   ASSESSMENT:  1.  Swelling of the lower extremities, left greater than right.  Given her      elevation of D-dimer, she may have a DVT.  We will obtain Doppler      studies to rule this out.  For now, we will put her on prophylactic dose      Lovenox, but switch this to therapeutic if it is positive for DVT.  2.  EKG changes.  The patient had an EKG done in the  emergency department      routinely.  She was not complaining of any chest pain.  EKG did show a      normal sinus rhythm at 84 beats per minute with marked ST and T wave      abnormalities in the inferolateral leads.  Mainly she had diffuse T wave      inversions.  Given this, we will cycle enzymes to rule out acute      coronary syndrome, but as I said, she has been asymptomatic.  3.  Questionable goiter seen on chest x-ray.  Will check a TSH.  4.  History of incarceration.  Will the patient deny drug and alcohol abuse,      will check a urine drug screen and avoid using beta blocker for her      hypertension until we rule out any recent cocaine use.  I will      prophylactically put her on thiamine and folic acid supplementation.  5.  Hypertension.  Will start the patient on IMDUR 10 mg daily along with a      diuretic, HCTZ 25 mg daily.  Additionally, I will give her an aspirin      daily.       CR/MEDQ  D:  09/02/2004  T:  09/02/2004  Job:  [id number]

## 2011-08-04 LAB — BASIC METABOLIC PANEL
BUN: 11
CO2: 28
Calcium: 9.1
Chloride: 101
Creatinine, Ser: 1.33 — ABNORMAL HIGH
Creatinine, Ser: 1.36 — ABNORMAL HIGH
GFR calc Af Amer: 48 — ABNORMAL LOW
GFR calc non Af Amer: 41 — ABNORMAL LOW
Glucose, Bld: 94
Sodium: 136

## 2011-08-04 LAB — DIFFERENTIAL
Basophils Absolute: 0
Basophils Absolute: 0
Basophils Relative: 0
Eosinophils Absolute: 0
Eosinophils Absolute: 0.2
Lymphocytes Relative: 40
Lymphs Abs: 2.2
Monocytes Absolute: 0.4
Monocytes Relative: 6
Neutrophils Relative %: 47
Neutrophils Relative %: 69

## 2011-08-04 LAB — URINALYSIS, ROUTINE W REFLEX MICROSCOPIC
Bilirubin Urine: NEGATIVE
Nitrite: NEGATIVE
Specific Gravity, Urine: 1.007
Urobilinogen, UA: 1
pH: 6.5

## 2011-08-04 LAB — CARDIAC PANEL(CRET KIN+CKTOT+MB+TROPI)
CK, MB: 1.6
CK, MB: 1.8
Relative Index: 1.4
Relative Index: 1.5
Total CK: 109
Troponin I: 0.06

## 2011-08-04 LAB — CBC
HCT: 35.9 — ABNORMAL LOW
Hemoglobin: 12
Platelets: 203
RBC: 4.45
RDW: 15
RDW: 15.3
WBC: 5.4
WBC: 6.1

## 2011-08-04 LAB — COMPREHENSIVE METABOLIC PANEL
ALT: 44 — ABNORMAL HIGH
Alkaline Phosphatase: 78
BUN: 21
Chloride: 104
Glucose, Bld: 151 — ABNORMAL HIGH
Potassium: 3.3 — ABNORMAL LOW
Sodium: 140
Total Bilirubin: 0.4
Total Protein: 7

## 2011-08-04 LAB — D-DIMER, QUANTITATIVE: D-Dimer, Quant: 1.46 — ABNORMAL HIGH

## 2011-08-04 LAB — POCT CARDIAC MARKERS
CKMB, poc: 1.1
CKMB, poc: <1 — ABNORMAL LOW
Myoglobin, poc: 58.4
Troponin i, poc: <0.05

## 2011-08-04 LAB — RAPID URINE DRUG SCREEN, HOSP PERFORMED
Barbiturates: NOT DETECTED
Cocaine: POSITIVE — AB
Opiates: NOT DETECTED
Tetrahydrocannabinol: NOT DETECTED

## 2011-08-04 LAB — TSH: TSH: 0.392

## 2011-08-04 LAB — PROTIME-INR
INR: 1.1
Prothrombin Time: 14.7

## 2011-08-04 LAB — APTT: aPTT: 40 — ABNORMAL HIGH

## 2011-08-04 LAB — TROPONIN I: Troponin I: 0.05

## 2011-08-04 LAB — CK TOTAL AND CKMB (NOT AT ARMC): Relative Index: 1.4

## 2012-06-08 DIAGNOSIS — H04129 Dry eye syndrome of unspecified lacrimal gland: Secondary | ICD-10-CM | POA: Diagnosis not present

## 2012-06-09 DIAGNOSIS — F329 Major depressive disorder, single episode, unspecified: Secondary | ICD-10-CM | POA: Diagnosis not present

## 2012-06-16 DIAGNOSIS — Z79899 Other long term (current) drug therapy: Secondary | ICD-10-CM | POA: Diagnosis not present

## 2012-07-04 DIAGNOSIS — I10 Essential (primary) hypertension: Secondary | ICD-10-CM | POA: Diagnosis not present

## 2012-07-04 DIAGNOSIS — I6350 Cerebral infarction due to unspecified occlusion or stenosis of unspecified cerebral artery: Secondary | ICD-10-CM | POA: Diagnosis not present

## 2012-07-07 DIAGNOSIS — M79609 Pain in unspecified limb: Secondary | ICD-10-CM | POA: Diagnosis not present

## 2012-07-07 DIAGNOSIS — B351 Tinea unguium: Secondary | ICD-10-CM | POA: Diagnosis not present

## 2012-07-23 DIAGNOSIS — E785 Hyperlipidemia, unspecified: Secondary | ICD-10-CM | POA: Diagnosis not present

## 2012-07-23 DIAGNOSIS — I259 Chronic ischemic heart disease, unspecified: Secondary | ICD-10-CM | POA: Diagnosis not present

## 2012-09-08 DIAGNOSIS — F329 Major depressive disorder, single episode, unspecified: Secondary | ICD-10-CM | POA: Diagnosis not present

## 2012-09-20 DIAGNOSIS — Z79899 Other long term (current) drug therapy: Secondary | ICD-10-CM | POA: Diagnosis not present

## 2012-09-21 DIAGNOSIS — M79609 Pain in unspecified limb: Secondary | ICD-10-CM | POA: Diagnosis not present

## 2012-09-21 DIAGNOSIS — B351 Tinea unguium: Secondary | ICD-10-CM | POA: Diagnosis not present

## 2012-11-10 DIAGNOSIS — Z79899 Other long term (current) drug therapy: Secondary | ICD-10-CM | POA: Diagnosis not present

## 2012-12-05 DIAGNOSIS — N390 Urinary tract infection, site not specified: Secondary | ICD-10-CM | POA: Diagnosis not present

## 2012-12-09 DIAGNOSIS — Z1231 Encounter for screening mammogram for malignant neoplasm of breast: Secondary | ICD-10-CM | POA: Diagnosis not present

## 2012-12-27 DIAGNOSIS — Z79899 Other long term (current) drug therapy: Secondary | ICD-10-CM | POA: Diagnosis not present

## 2013-09-05 DIAGNOSIS — M79609 Pain in unspecified limb: Secondary | ICD-10-CM | POA: Diagnosis not present

## 2013-09-05 DIAGNOSIS — B351 Tinea unguium: Secondary | ICD-10-CM | POA: Diagnosis not present

## 2013-10-10 ENCOUNTER — Non-Acute Institutional Stay (SKILLED_NURSING_FACILITY): Payer: PRIVATE HEALTH INSURANCE | Admitting: Internal Medicine

## 2013-10-10 ENCOUNTER — Encounter: Payer: Self-pay | Admitting: Internal Medicine

## 2013-10-10 DIAGNOSIS — I69922 Dysarthria following unspecified cerebrovascular disease: Secondary | ICD-10-CM

## 2013-10-10 DIAGNOSIS — F32A Depression, unspecified: Secondary | ICD-10-CM

## 2013-10-10 DIAGNOSIS — J449 Chronic obstructive pulmonary disease, unspecified: Secondary | ICD-10-CM

## 2013-10-10 DIAGNOSIS — F0151 Vascular dementia with behavioral disturbance: Secondary | ICD-10-CM

## 2013-10-10 DIAGNOSIS — I509 Heart failure, unspecified: Secondary | ICD-10-CM

## 2013-10-10 DIAGNOSIS — I129 Hypertensive chronic kidney disease with stage 1 through stage 4 chronic kidney disease, or unspecified chronic kidney disease: Secondary | ICD-10-CM

## 2013-10-10 DIAGNOSIS — I798 Other disorders of arteries, arterioles and capillaries in diseases classified elsewhere: Secondary | ICD-10-CM

## 2013-10-10 DIAGNOSIS — F0153 Vascular dementia, unspecified severity, with mood disturbance: Secondary | ICD-10-CM

## 2013-10-10 DIAGNOSIS — I130 Hypertensive heart and chronic kidney disease with heart failure and stage 1 through stage 4 chronic kidney disease, or unspecified chronic kidney disease: Secondary | ICD-10-CM

## 2013-10-10 DIAGNOSIS — I272 Pulmonary hypertension, unspecified: Secondary | ICD-10-CM | POA: Insufficient documentation

## 2013-10-10 DIAGNOSIS — E1159 Type 2 diabetes mellitus with other circulatory complications: Secondary | ICD-10-CM

## 2013-10-10 DIAGNOSIS — E049 Nontoxic goiter, unspecified: Secondary | ICD-10-CM

## 2013-10-10 DIAGNOSIS — N189 Chronic kidney disease, unspecified: Secondary | ICD-10-CM

## 2013-10-10 DIAGNOSIS — I5032 Chronic diastolic (congestive) heart failure: Secondary | ICD-10-CM

## 2013-10-10 DIAGNOSIS — I2510 Atherosclerotic heart disease of native coronary artery without angina pectoris: Secondary | ICD-10-CM

## 2013-10-10 DIAGNOSIS — E1151 Type 2 diabetes mellitus with diabetic peripheral angiopathy without gangrene: Secondary | ICD-10-CM

## 2013-10-10 DIAGNOSIS — I2789 Other specified pulmonary heart diseases: Secondary | ICD-10-CM

## 2013-10-10 DIAGNOSIS — K5909 Other constipation: Secondary | ICD-10-CM

## 2013-10-10 DIAGNOSIS — E1149 Type 2 diabetes mellitus with other diabetic neurological complication: Secondary | ICD-10-CM | POA: Insufficient documentation

## 2013-10-10 DIAGNOSIS — K5900 Constipation, unspecified: Secondary | ICD-10-CM

## 2013-10-10 DIAGNOSIS — J4489 Other specified chronic obstructive pulmonary disease: Secondary | ICD-10-CM

## 2013-10-10 DIAGNOSIS — E785 Hyperlipidemia, unspecified: Secondary | ICD-10-CM

## 2013-10-10 NOTE — Progress Notes (Signed)
Patient ID: Sheila Flynn, female   DOB: July 26, 1946, 67 y.o.   MRN: [id number]  Provider:  Gwenith Spitz. Renato Gails, D.O., C.M.D. Location:  [facility]  PCP: Jimmey Hengel, [place], DO while at [facility]  Code Status: full code, pt is her own RP  Allergies  Allergen Reactions  . Ace Inhibitors Swelling    Chief Complaint  Patient presents with  . New Evaluation    admitted from [facility]    HPI: 67 y.o. female with h/o COPD, CAD s/p NSTEMI, chronic diastolic CHF and pulmonary hypertension, prior polysubstance abuse, malignant hypertension with CHF and CKD, DMII with PVD, hyperlipidemia, prior stroke now with dysarthria and some vascular dementia with depression was admitted here for long term care (transferred from another SNF).  Upon review of her records, she has a thyroid goiter with ? Of mass that will require f/u in a year.  She has recently had a hospitalization for aspiration pneumonia in the context of polysubstance abuse (crack, amphetamines) with acute respiratory failure.  She is her own RP and wants full rescuscitative measures, but recently refused a stress test which was recommended due to worsening CHF and angina.    ROS: Review of Systems  Constitutional: Negative for fever and chills.  HENT: Negative for congestion.   Eyes: Negative for blurred vision.  Respiratory: Negative for shortness of breath.   Cardiovascular: Negative for chest pain and leg swelling.  Gastrointestinal: Negative for abdominal pain, constipation, blood in stool and melena.  Genitourinary: Negative for dysuria.  Musculoskeletal: Negative for back pain and joint pain.  Skin: Negative for rash.  Neurological: Negative for dizziness.  Endo/Heme/Allergies: Does not bruise/bleed easily.  Psychiatric/Behavioral: Positive for memory loss.     Past Medical History  Diagnosis Date  . Malignant essential hypertension with congestive heart failure with  chronic kidney disease   . Chronic diastolic CHF (congestive heart failure)   . CAD (coronary artery disease)     Dr. Marcelle Overlie  . Angina at rest   . Myocardial infarction   . 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 Hyperlipidemia LDL goal < 100   . Aspiration pneumonia   . COPD (chronic obstructive pulmonary disease)   . Acute respiratory failure 12/21/10  . Pulmonary hypertension   . Chronic constipation   . Rectal cancer     s/p colostomy  . Thyroid goiter   . Stroke 2011    postop  . 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 Vascular dementia with depressed mood   . Pneumonia 12/21/10  . Delirium, induced by drug     polypharmacy  . Sacral fracture, closed     s/p kyphoplasty  . Fall     history of falls   Past Surgical History  Procedure Laterality Date  . Kyphoplasty      sacrum  . Colostomy      rectal ca   Social History:   reports that she has quit smoking. Her smoking use included Cigarettes. She smoked 0.00 packs per day. She does not have any smokeless tobacco history on file. She reports that she uses illicit drugs. She reports that she does not drink alcohol.  History reviewed. No pertinent family history.  Medications: Patient's Medications  New Prescriptions   No medications on file  Previous Medications   ACETAMINOPHEN (TYLENOL) 325 MG TABLET    Take 650 mg by mouth every 6 (six) hours  as needed for mild pain.   ALBUTEROL (PROVENTIL HFA;VENTOLIN HFA) 108 (90 BASE) MCG/ACT INHALER    Inhale 2 puffs into the lungs every 4 (four) hours as needed for wheezing or shortness of breath.   AMLODIPINE (NORVASC) 10 MG TABLET    Take 10 mg by mouth daily.   ASPIRIN (ASPIRIN EC) 81 MG EC TABLET    Take 81 mg by mouth daily. Swallow whole.   ATORVASTATIN (LIPITOR) 20 MG TABLET    Take 20 mg by mouth at bedtime.   CARVEDILOL (COREG) 3.125 MG TABLET    Take 3.125 mg by mouth 2 (two) times daily with a meal.   CLONIDINE  (CATAPRES) 0.1 MG TABLET    Take 0.1 mg by mouth every 8 (eight) hours as needed (sbp>160).   CLONIDINE (CATAPRES) 0.1 MG TABLET    Take 0.1 mg by mouth 2 (two) times daily.   DOCUSATE SODIUM (COLACE) 100 MG CAPSULE    Take 100 mg by mouth 3 (three) times daily.   ERGOCALCIFEROL (VITAMIN D2) 50000 UNITS CAPSULE    Take 50,000 Units by mouth daily.   GLIMEPIRIDE (AMARYL) 2 MG TABLET    Take 2 mg by mouth daily with breakfast.   HYDRALAZINE (APRESOLINE) 10 MG TABLET    Take 10 mg by mouth 2 (two) times daily.   ISOSORBIDE MONONITRATE (ISMO,MONOKET) 20 MG TABLET    Take 20 mg by mouth daily.   PAROXETINE (PAXIL) 20 MG TABLET    Take 20 mg by mouth daily.   POLYVINYL ALCOHOL (LIQUIFILM TEARS) 1.4 % OPHTHALMIC SOLUTION    Place 2 drops into both eyes 3 (three) times daily as needed for dry eyes.   RANITIDINE (ZANTAC) 150 MG CAPSULE    Take 150 mg by mouth every evening.  Modified Medications   No medications on file  Discontinued Medications   No medications on file     Physical Exam: Filed Vitals:   10/10/13 1216  BP: 148/84  Pulse: 68  Temp: 97.4 F (36.3 C)  Resp: 16  Height: 5\' 3"  (1.6 m)  Weight: 128 lb (58.06 kg)  SpO2: 97%  Physical Exam  Constitutional: She appears well-developed and well-nourished. No distress.  Black female seated on her bed eating breakfast  HENT:  Head: Normocephalic and atraumatic.  Right Ear: External ear normal.  Left Ear: External ear normal.  Nose: Nose normal.  Mouth/Throat: Oropharynx is clear and moist.  Eyes: Conjunctivae and EOM are normal. Pupils are equal, round, and reactive to light.  Neck: Neck supple. No JVD present.  Cardiovascular: Normal rate, regular rhythm, normal heart sounds and intact distal pulses.   Pulmonary/Chest: Effort normal.  Rhonchi present  Abdominal: Soft. Bowel sounds are normal. She exhibits no distension. There is no tenderness.  Musculoskeletal: Normal range of motion. She exhibits no edema and no tenderness.    Neurological: She is alert. No cranial nerve deficit. She exhibits normal muscle tone.  Oriented to person, place, not exact time, scored 15 on BIMS  Skin: Skin is warm and dry.  Psychiatric:  Affect unusual, spoke in a very soft voice   Imaging and Procedures: 12/21/10:  CT chest/abd/pelvis:  Neg for PE, enlarge central pulmonary artery c/w pulmonary arterial htn, enlarged thyroid gland compressing trachea just below thoracic level causing sngificant tracehal stenosis, nondisplaced bilateral sacral ala fx extending into S1 foramen bilaterally 12/21/10:  CT head/cspine:  Remote infarct, no acute infarct, cervical spondylosis present, no fx. 08/03/13:  Echo:  Nl to hyperdynamic  LV function--EF 79%, LVH, diastolic dysfunction, mild to moderate MR and TR.   08/08/13:  CT chest w/o contrast (abn xray, mediastinal mass?):  Diffusely enlarged thyroid gland likely a thyroid goiter.  Underlying mass cannot be completely excluded.  Small pericardial effusion.  Small nodule right lower thyroid lobe 3mm.  12 month f/u imaging indicated if high risk malignancy.  Assessment/Plan 1. Chronic diastolic CHF (congestive heart failure) -Needs indefinite weekly weights due to her chf -will monitor closely for edema, sob, hypoxia  2. CAD (coronary artery disease) -with prior MI (date unknown) -cont secondary prevention with BP control, asa, statin, beta blocker for chf, but cannot take ACEI so on hydralazine and nitrates instead -recent difficulty with angina, but refused stress test at last facility -f/u with Dr. Marcelle Overlie around 4/15  3. DM (diabetes mellitus) type II controlled peripheral vascular disorder -check hba1c and bmp for renal function, cont CONCHO NAS diet, asa, statin, cannot take ACEI -will need foot and eye care during clinics here  4. Hyperlipidemia LDL goal < 100 -needs flp with next set of labs especially with probable vascular etiology to dementia  5. COPD (chronic obstructive pulmonary  disease) -cont albuterol inhaler prn and monitor -had spirometry done 11/29/12 through prior facility  6. Vascular dementia with depressed mood -not on meds -is own RP -was being followed by [facility] at her prior facility -needs memory testing  7. 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 Thyroid goiter -needs f/u thyroid US 9/15 to rule out underlying mass (per old radiology report in records)  9. Pulmonary hypertension -identified previously, has COPD, diastolic CHF on echo  10. Chronic constipation On tid colace, monitor BMs, encourage adequate hydration  11. Malignant essential hypertension with congestive heart failure with chronic kidney disease -on unusual bp regimen -will continue for now and monitor--?whether she needs the clonidine tid--could increase hydralazine instead which is typically given more than bid  Functional status:  Requires minimal assist with ADLs  Family/ staff Communication: pt is her own RP  Labs/tests ordered:  Cbc, bmp, hba1c;  Pt/ot/st F/u in April 2015 with Dr. Marcelle Overlie, cardiologist F/u thyroid US indicated in sept 2015

## 2013-10-31 ENCOUNTER — Encounter: Payer: Self-pay | Admitting: Internal Medicine

## 2013-10-31 ENCOUNTER — Non-Acute Institutional Stay (SKILLED_NURSING_FACILITY): Payer: PRIVATE HEALTH INSURANCE | Admitting: Internal Medicine

## 2013-10-31 DIAGNOSIS — I5032 Chronic diastolic (congestive) heart failure: Secondary | ICD-10-CM

## 2013-10-31 DIAGNOSIS — I130 Hypertensive heart and chronic kidney disease with heart failure and stage 1 through stage 4 chronic kidney disease, or unspecified chronic kidney disease: Secondary | ICD-10-CM

## 2013-10-31 DIAGNOSIS — E1159 Type 2 diabetes mellitus with other circulatory complications: Secondary | ICD-10-CM

## 2013-10-31 DIAGNOSIS — J4489 Other specified chronic obstructive pulmonary disease: Secondary | ICD-10-CM

## 2013-10-31 DIAGNOSIS — I2510 Atherosclerotic heart disease of native coronary artery without angina pectoris: Secondary | ICD-10-CM

## 2013-10-31 DIAGNOSIS — I129 Hypertensive chronic kidney disease with stage 1 through stage 4 chronic kidney disease, or unspecified chronic kidney disease: Secondary | ICD-10-CM

## 2013-10-31 DIAGNOSIS — I798 Other disorders of arteries, arterioles and capillaries in diseases classified elsewhere: Secondary | ICD-10-CM

## 2013-10-31 DIAGNOSIS — F32A Depression, unspecified: Secondary | ICD-10-CM

## 2013-10-31 DIAGNOSIS — F0151 Vascular dementia with behavioral disturbance: Secondary | ICD-10-CM

## 2013-10-31 DIAGNOSIS — E1151 Type 2 diabetes mellitus with diabetic peripheral angiopathy without gangrene: Secondary | ICD-10-CM

## 2013-10-31 DIAGNOSIS — N189 Chronic kidney disease, unspecified: Secondary | ICD-10-CM

## 2013-10-31 DIAGNOSIS — F0153 Vascular dementia, unspecified severity, with mood disturbance: Secondary | ICD-10-CM

## 2013-10-31 DIAGNOSIS — I509 Heart failure, unspecified: Secondary | ICD-10-CM

## 2013-10-31 DIAGNOSIS — J449 Chronic obstructive pulmonary disease, unspecified: Secondary | ICD-10-CM

## 2013-10-31 NOTE — Progress Notes (Signed)
Patient ID: Sheila Flynn, female   DOB: 05-Sep-1946, 67 y.o.   MRN: [id number]  Location: [facility] Provider:  Gwenith Spitz. Renato Gails, D.O., C.M.D.  Code Status:  Full code  Chief Complaint  Patient presents with  . Medical Managment of Chronic Issues    HPI:  67 yo female here for long term care seen for monthly visit to manage chronic diseases.  Admission weight was actually 185 lbs and I entered it incorrectly.  She now weighs 183 lbs.    Review of Systems:  Review of Systems  Constitutional: Positive for malaise/fatigue. Negative for fever.  HENT: Negative for congestion.   Eyes: Negative for blurred vision.  Respiratory: Negative for shortness of breath.   Cardiovascular: Negative for chest pain, palpitations and leg swelling.  Genitourinary: Negative for dysuria.  Musculoskeletal: Negative for falls.  Skin: Negative for rash.  Neurological: Negative for dizziness, loss of consciousness, weakness and headaches.  Endo/Heme/Allergies: Bruises/bleeds easily.  Psychiatric/Behavioral: Positive for depression and memory loss. The patient is nervous/anxious.     Medications: Patient's Medications  New Prescriptions   No medications on file  Previous Medications   ACETAMINOPHEN (TYLENOL) 325 MG TABLET    Take 650 mg by mouth every 6 (six) hours as needed for mild pain.   ALBUTEROL (PROVENTIL HFA;VENTOLIN HFA) 108 (90 BASE) MCG/ACT INHALER    Inhale 2 puffs into the lungs every 4 (four) hours as needed for wheezing or shortness of breath.   AMLODIPINE (NORVASC) 10 MG TABLET    Take 10 mg by mouth daily.   ASPIRIN (ASPIRIN EC) 81 MG EC TABLET    Take 81 mg by mouth daily. Swallow whole.   ATORVASTATIN (LIPITOR) 20 MG TABLET    Take 20 mg by mouth at bedtime.   CARVEDILOL (COREG) 3.125 MG TABLET    Take 3.125 mg by mouth 2 (two) times daily with a meal.   CLONIDINE (CATAPRES) 0.1 MG TABLET    Take 0.1 mg by mouth every 8 (eight) hours as needed (sbp>160).   CLONIDINE (CATAPRES) 0.1 MG TABLET    Take 0.1 mg by mouth 2 (two) times daily.   DOCUSATE SODIUM (COLACE) 100 MG CAPSULE    Take 100 mg by mouth 3 (three) times daily.   GLIMEPIRIDE (AMARYL) 2 MG TABLET    Take 2 mg by mouth daily with breakfast.   HYDRALAZINE (APRESOLINE) 10 MG TABLET    Take 10 mg by mouth 2 (two) times daily.   ISOSORBIDE MONONITRATE (ISMO,MONOKET) 20 MG TABLET    Take 20 mg by mouth daily.   PAROXETINE (PAXIL) 20 MG TABLET    Take 30 mg by mouth daily.    POLYVINYL ALCOHOL (LIQUIFILM TEARS) 1.4 % OPHTHALMIC SOLUTION    Place 2 drops into both eyes 3 (three) times daily as needed for dry eyes.   RANITIDINE (ZANTAC) 150 MG CAPSULE    Take 150 mg by mouth every evening.  Modified Medications   No medications on file  Discontinued Medications   No medications on file    Physical Exam: Filed Vitals:   10/31/13 1459  BP: 154/80  Pulse: 72  Temp: 97.7 F (36.5 C)  Resp: 20  Weight: 183 lb (83.008 kg)  Physical Exam  Constitutional: She appears well-developed and well-nourished. No distress.  Cardiovascular: Normal rate, regular rhythm, normal heart sounds and intact distal pulses.   Mild nonpitting edema of bilateral LE  Pulmonary/Chest: Effort normal and breath sounds normal. No respiratory distress. She  has no rales.  Abdominal: Soft. Bowel sounds are normal. She exhibits no distension and no mass.  Musculoskeletal: She exhibits no tenderness.  Neurological: She is alert.  Oriented to person and place not time  Psychiatric:  Unusual affect    Labs reviewed: 10/09/13:  Wbc 4.1, h/h 10.9/33.9, plts 255; Na 142, K 3.8, BUN 22, cr 1.49, Ca 8.8; hba1c 6.5 10/13/13:  Tc 144, TG 124, HDL 36, LDL 83  Assessment/Plan 1. Chronic diastolic CHF (congestive heart failure) -no significant edema or shortness of breath to suggest exacerbation -weight has come down -cont current regimen, labs stable above 2. CAD (coronary artery disease) -cont secondary prevention with bp,  lipid and DMII control  3. DM (diabetes mellitus) type II controlled peripheral vascular disorder -last hba1c this month at goal -cont current regimen, CONCHO NAS diet  4. Vascular dementia with depressed mood -mild to moderate, but was not doing well living independently  5. COPD (chronic obstructive pulmonary disease) -stable w/o exacerbation, cont current meds  6. Malignant essential hypertension with congestive heart failure with chronic kidney disease -cont current regimen, at goal  Family/ staff Communication: seen with unit supervisor  Goals of care:  Full code  Labs/tests ordered:  none

## 2013-11-21 ENCOUNTER — Non-Acute Institutional Stay (SKILLED_NURSING_FACILITY): Payer: Medicare Other | Admitting: Internal Medicine

## 2013-11-21 DIAGNOSIS — K029 Dental caries, unspecified: Secondary | ICD-10-CM

## 2013-11-21 DIAGNOSIS — F1910 Other psychoactive substance abuse, uncomplicated: Secondary | ICD-10-CM

## 2013-11-21 NOTE — Progress Notes (Signed)
Patient ID: Sheila Flynn, female   DOB: 11-10-1945, 68 y.o.   MRN: [id number]  Location: [facility] Provider:  Rexene Edison. Mariea Clonts, D.O., C.M.D.   Chief Complaint  Patient presents with  . Acute Visit    c/o toothache and wants something other than tylenol for pain    HPI:  68 yo black female here for long term care was seen due to concerns about a toothache on the right side of her jaw.  She says tylenol is not doing the trick for the pain.  She has visible caries and some swelling on the right side.  She's been eating on the opposite side.  Review of Systems:  Review of Systems  Constitutional: Negative for fever and chills.  HENT:       Sore gums and tooth on right  Respiratory: Negative for shortness of breath.   Cardiovascular: Negative for chest pain.  Gastrointestinal: Negative for constipation.  Genitourinary: Negative for dysuria.  Musculoskeletal: Negative for falls.  Neurological: Negative for dizziness and loss of consciousness.  Psychiatric/Behavioral: Positive for memory loss and substance abuse.       H/o polysubstance abuse    Medications: Patient's Medications  New Prescriptions   No medications on file  Previous Medications   ACETAMINOPHEN (TYLENOL) 325 MG TABLET    Take 650 mg by mouth every 6 (six) hours as needed for mild pain.   ALBUTEROL (PROVENTIL HFA;VENTOLIN HFA) 108 (90 BASE) MCG/ACT INHALER    Inhale 2 puffs into the lungs every 4 (four) hours as needed for wheezing or shortness of breath.   AMLODIPINE (NORVASC) 10 MG TABLET    Take 10 mg by mouth daily.   ASPIRIN (ASPIRIN EC) 81 MG EC TABLET    Take 81 mg by mouth daily. Swallow whole.   ATORVASTATIN (LIPITOR) 20 MG TABLET    Take 20 mg by mouth at bedtime.   CARVEDILOL (COREG) 3.125 MG TABLET    Take 3.125 mg by mouth 2 (two) times daily with a meal.   CLONIDINE (CATAPRES) 0.1 MG TABLET    Take 0.1 mg by mouth every 8 (eight) hours as needed (sbp>160).   CLONIDINE (CATAPRES)  0.1 MG TABLET    Take 0.1 mg by mouth 2 (two) times daily.   DOCUSATE SODIUM (COLACE) 100 MG CAPSULE    Take 100 mg by mouth 3 (three) times daily.   GLIMEPIRIDE (AMARYL) 2 MG TABLET    Take 2 mg by mouth daily with breakfast.   HYDRALAZINE (APRESOLINE) 10 MG TABLET    Take 10 mg by mouth 2 (two) times daily.   ISOSORBIDE MONONITRATE (ISMO,MONOKET) 20 MG TABLET    Take 20 mg by mouth daily.   PAROXETINE (PAXIL) 20 MG TABLET    Take 30 mg by mouth daily.    POLYVINYL ALCOHOL (LIQUIFILM TEARS) 1.4 % OPHTHALMIC SOLUTION    Place 2 drops into both eyes 3 (three) times daily as needed for dry eyes.   RANITIDINE (ZANTAC) 150 MG CAPSULE    Take 150 mg by mouth every evening.  Modified Medications   No medications on file  Discontinued Medications   No medications on file    Physical Exam: Filed Vitals:   11/21/13 1146  BP: 141/78  Pulse: 76  Temp: 97.2 F (36.2 C)  Resp: 18  Weight: 183 lb (83.008 kg)   Physical Exam  HENT:  Right lower jaw tender and evidence of caries present  Cardiovascular: Normal rate, regular rhythm, normal  heart sounds and intact distal pulses.   Pulmonary/Chest: Effort normal and breath sounds normal. No respiratory distress.  Abdominal: Soft. Bowel sounds are normal. She exhibits no distension and no mass. There is no tenderness.  Musculoskeletal: Normal range of motion.  Neurological: She is alert.  Skin: Skin is warm and dry.    Assessment/Plan 1. Pain due to dental caries Short term tramadol recommend seeing dentist Avoid long term narcs due to h/o polysubstance abuse  2. Polysubstance abuse -will only use tramadol short term until sees dentist  Family/ staff Communication: seen with unit supervisor  Goals of care: long term care resident  Labs/tests ordered:  None today

## 2013-11-24 ENCOUNTER — Non-Acute Institutional Stay (SKILLED_NURSING_FACILITY): Payer: Medicare Other | Admitting: Internal Medicine

## 2013-11-24 DIAGNOSIS — I10 Essential (primary) hypertension: Secondary | ICD-10-CM

## 2013-11-24 DIAGNOSIS — I6789 Other cerebrovascular disease: Secondary | ICD-10-CM | POA: Diagnosis not present

## 2013-11-24 DIAGNOSIS — K089 Disorder of teeth and supporting structures, unspecified: Secondary | ICD-10-CM

## 2013-11-24 DIAGNOSIS — I69959 Hemiplegia and hemiparesis following unspecified cerebrovascular disease affecting unspecified side: Secondary | ICD-10-CM | POA: Diagnosis not present

## 2013-11-24 DIAGNOSIS — N181 Chronic kidney disease, stage 1: Secondary | ICD-10-CM | POA: Diagnosis not present

## 2013-11-24 DIAGNOSIS — E119 Type 2 diabetes mellitus without complications: Secondary | ICD-10-CM | POA: Diagnosis not present

## 2013-11-24 DIAGNOSIS — J449 Chronic obstructive pulmonary disease, unspecified: Secondary | ICD-10-CM | POA: Diagnosis not present

## 2013-11-24 DIAGNOSIS — R269 Unspecified abnormalities of gait and mobility: Secondary | ICD-10-CM | POA: Diagnosis not present

## 2013-11-24 DIAGNOSIS — K0889 Other specified disorders of teeth and supporting structures: Secondary | ICD-10-CM

## 2013-11-24 DIAGNOSIS — J189 Pneumonia, unspecified organism: Secondary | ICD-10-CM | POA: Diagnosis not present

## 2013-11-24 DIAGNOSIS — M6281 Muscle weakness (generalized): Secondary | ICD-10-CM | POA: Diagnosis not present

## 2013-11-24 DIAGNOSIS — F329 Major depressive disorder, single episode, unspecified: Secondary | ICD-10-CM | POA: Diagnosis not present

## 2013-11-24 DIAGNOSIS — K219 Gastro-esophageal reflux disease without esophagitis: Secondary | ICD-10-CM | POA: Diagnosis not present

## 2013-11-24 DIAGNOSIS — F3289 Other specified depressive episodes: Secondary | ICD-10-CM | POA: Diagnosis not present

## 2013-11-24 DIAGNOSIS — E785 Hyperlipidemia, unspecified: Secondary | ICD-10-CM | POA: Diagnosis not present

## 2013-11-24 DIAGNOSIS — IMO0001 Reserved for inherently not codable concepts without codable children: Secondary | ICD-10-CM | POA: Diagnosis not present

## 2013-11-24 NOTE — Progress Notes (Signed)
Patient ID: Sheila Flynn, female   DOB: 11-Nov-1945, 68 y.o.   MRN: [id number]    Sheila Flynn living [facility] Chief Complaint  Patient presents with  . Acute Visit    toothache   Allergies  Allergen Reactions  . Ace Inhibitors Swelling   HPI 68 y/o female pt has been having pain in her right upper molar area for a week now, the pain is intermittent, she is avoiding to chew on that side. Denies difficulty swallowing. No ear ache. No URI symptoms. Dental appointment is pending. No fever or chills  ROS No chest pain or trouble breathing No nausea or vomiting No diarrhea No headache  Past Medical History  Diagnosis Date  . 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 Chronic diastolic CHF (congestive heart failure)   . CAD (coronary artery disease)     Dr. Matthew Saras  . Angina at rest   . Myocardial infarction   . 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 Hyperlipidemia LDL goal < 100   . Aspiration pneumonia   . COPD (chronic obstructive pulmonary disease)   . Acute respiratory failure 12/21/10  . Pulmonary hypertension   . Chronic constipation   . Rectal cancer     s/p colostomy  . Thyroid goiter   . Stroke 2011    postop  . 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 Vascular dementia with depressed mood   . Pneumonia 12/21/10  . Delirium, induced by drug     polypharmacy  . Sacral fracture, closed     s/p kyphoplasty  . Fall     history of falls   Medication reviewed. See [facility]  Physical exam BP 150/66  Pulse 70  Temp(Src) 97.2 F (36.2 C)  Resp 18  SpO2 96%  gen- elderly female in NAD HEENT- no visible cavity, no gum swelling, no palpable neck lymph nodes cvs- regular heart rate, no rubs/ gallops respi- B/L CTA, no wheeze or rhonchi Ext- wheels herself on wheelchair Neuro- no focal deficit, alert and oriented  Assessment/plan  Toothache Will have her be  seen by dentist. Tylenol has not been helpful. Will have her on tramadol 50 mg 1 tab every 8 hour as needed for the toothache. Monitor for fever and other signs of infection as she is a known diabetic  Hypertension- no headache or change of vision. SBP elevated likely in setting of her pain. Will have her pain controlled and reassess bp readings. Continue her amlodipine, carvedilol, clonidine, hydralazine and imdur for now

## 2013-11-27 DIAGNOSIS — F3289 Other specified depressive episodes: Secondary | ICD-10-CM | POA: Diagnosis not present

## 2013-11-27 DIAGNOSIS — I10 Essential (primary) hypertension: Secondary | ICD-10-CM | POA: Diagnosis not present

## 2013-11-27 DIAGNOSIS — F329 Major depressive disorder, single episode, unspecified: Secondary | ICD-10-CM | POA: Diagnosis not present

## 2013-11-27 DIAGNOSIS — J189 Pneumonia, unspecified organism: Secondary | ICD-10-CM | POA: Diagnosis not present

## 2013-11-27 DIAGNOSIS — IMO0001 Reserved for inherently not codable concepts without codable children: Secondary | ICD-10-CM | POA: Diagnosis not present

## 2013-11-27 DIAGNOSIS — E119 Type 2 diabetes mellitus without complications: Secondary | ICD-10-CM | POA: Diagnosis not present

## 2013-11-27 DIAGNOSIS — E785 Hyperlipidemia, unspecified: Secondary | ICD-10-CM | POA: Diagnosis not present

## 2013-11-28 DIAGNOSIS — E785 Hyperlipidemia, unspecified: Secondary | ICD-10-CM | POA: Diagnosis not present

## 2013-11-28 DIAGNOSIS — F3289 Other specified depressive episodes: Secondary | ICD-10-CM | POA: Diagnosis not present

## 2013-11-28 DIAGNOSIS — J189 Pneumonia, unspecified organism: Secondary | ICD-10-CM | POA: Diagnosis not present

## 2013-11-28 DIAGNOSIS — IMO0001 Reserved for inherently not codable concepts without codable children: Secondary | ICD-10-CM | POA: Diagnosis not present

## 2013-11-28 DIAGNOSIS — I10 Essential (primary) hypertension: Secondary | ICD-10-CM | POA: Diagnosis not present

## 2013-11-28 DIAGNOSIS — E119 Type 2 diabetes mellitus without complications: Secondary | ICD-10-CM | POA: Diagnosis not present

## 2013-11-28 DIAGNOSIS — F329 Major depressive disorder, single episode, unspecified: Secondary | ICD-10-CM | POA: Diagnosis not present

## 2013-11-29 DIAGNOSIS — IMO0001 Reserved for inherently not codable concepts without codable children: Secondary | ICD-10-CM | POA: Diagnosis not present

## 2013-11-29 DIAGNOSIS — E785 Hyperlipidemia, unspecified: Secondary | ICD-10-CM | POA: Diagnosis not present

## 2013-11-29 DIAGNOSIS — F3289 Other specified depressive episodes: Secondary | ICD-10-CM | POA: Diagnosis not present

## 2013-11-29 DIAGNOSIS — J189 Pneumonia, unspecified organism: Secondary | ICD-10-CM | POA: Diagnosis not present

## 2013-11-29 DIAGNOSIS — E119 Type 2 diabetes mellitus without complications: Secondary | ICD-10-CM | POA: Diagnosis not present

## 2013-11-29 DIAGNOSIS — I10 Essential (primary) hypertension: Secondary | ICD-10-CM | POA: Diagnosis not present

## 2013-11-29 DIAGNOSIS — F329 Major depressive disorder, single episode, unspecified: Secondary | ICD-10-CM | POA: Diagnosis not present

## 2013-11-30 DIAGNOSIS — E119 Type 2 diabetes mellitus without complications: Secondary | ICD-10-CM | POA: Diagnosis not present

## 2013-11-30 DIAGNOSIS — I10 Essential (primary) hypertension: Secondary | ICD-10-CM | POA: Diagnosis not present

## 2013-11-30 DIAGNOSIS — F3289 Other specified depressive episodes: Secondary | ICD-10-CM | POA: Diagnosis not present

## 2013-11-30 DIAGNOSIS — IMO0001 Reserved for inherently not codable concepts without codable children: Secondary | ICD-10-CM | POA: Diagnosis not present

## 2013-11-30 DIAGNOSIS — E785 Hyperlipidemia, unspecified: Secondary | ICD-10-CM | POA: Diagnosis not present

## 2013-11-30 DIAGNOSIS — F329 Major depressive disorder, single episode, unspecified: Secondary | ICD-10-CM | POA: Diagnosis not present

## 2013-11-30 DIAGNOSIS — J189 Pneumonia, unspecified organism: Secondary | ICD-10-CM | POA: Diagnosis not present

## 2013-12-01 DIAGNOSIS — F329 Major depressive disorder, single episode, unspecified: Secondary | ICD-10-CM | POA: Diagnosis not present

## 2013-12-01 DIAGNOSIS — E785 Hyperlipidemia, unspecified: Secondary | ICD-10-CM | POA: Diagnosis not present

## 2013-12-01 DIAGNOSIS — E119 Type 2 diabetes mellitus without complications: Secondary | ICD-10-CM | POA: Diagnosis not present

## 2013-12-01 DIAGNOSIS — IMO0001 Reserved for inherently not codable concepts without codable children: Secondary | ICD-10-CM | POA: Diagnosis not present

## 2013-12-01 DIAGNOSIS — F3289 Other specified depressive episodes: Secondary | ICD-10-CM | POA: Diagnosis not present

## 2013-12-01 DIAGNOSIS — J189 Pneumonia, unspecified organism: Secondary | ICD-10-CM | POA: Diagnosis not present

## 2013-12-01 DIAGNOSIS — I10 Essential (primary) hypertension: Secondary | ICD-10-CM | POA: Diagnosis not present

## 2013-12-04 DIAGNOSIS — F3289 Other specified depressive episodes: Secondary | ICD-10-CM | POA: Diagnosis not present

## 2013-12-04 DIAGNOSIS — E119 Type 2 diabetes mellitus without complications: Secondary | ICD-10-CM | POA: Diagnosis not present

## 2013-12-04 DIAGNOSIS — IMO0001 Reserved for inherently not codable concepts without codable children: Secondary | ICD-10-CM | POA: Diagnosis not present

## 2013-12-04 DIAGNOSIS — J189 Pneumonia, unspecified organism: Secondary | ICD-10-CM | POA: Diagnosis not present

## 2013-12-04 DIAGNOSIS — F329 Major depressive disorder, single episode, unspecified: Secondary | ICD-10-CM | POA: Diagnosis not present

## 2013-12-04 DIAGNOSIS — I10 Essential (primary) hypertension: Secondary | ICD-10-CM | POA: Diagnosis not present

## 2013-12-04 DIAGNOSIS — E785 Hyperlipidemia, unspecified: Secondary | ICD-10-CM | POA: Diagnosis not present

## 2013-12-05 DIAGNOSIS — E119 Type 2 diabetes mellitus without complications: Secondary | ICD-10-CM | POA: Diagnosis not present

## 2013-12-05 DIAGNOSIS — J189 Pneumonia, unspecified organism: Secondary | ICD-10-CM | POA: Diagnosis not present

## 2013-12-05 DIAGNOSIS — F329 Major depressive disorder, single episode, unspecified: Secondary | ICD-10-CM | POA: Diagnosis not present

## 2013-12-05 DIAGNOSIS — F3289 Other specified depressive episodes: Secondary | ICD-10-CM | POA: Diagnosis not present

## 2013-12-05 DIAGNOSIS — IMO0001 Reserved for inherently not codable concepts without codable children: Secondary | ICD-10-CM | POA: Diagnosis not present

## 2013-12-05 DIAGNOSIS — E785 Hyperlipidemia, unspecified: Secondary | ICD-10-CM | POA: Diagnosis not present

## 2013-12-05 DIAGNOSIS — I10 Essential (primary) hypertension: Secondary | ICD-10-CM | POA: Diagnosis not present

## 2013-12-06 DIAGNOSIS — I10 Essential (primary) hypertension: Secondary | ICD-10-CM | POA: Diagnosis not present

## 2013-12-06 DIAGNOSIS — E119 Type 2 diabetes mellitus without complications: Secondary | ICD-10-CM | POA: Diagnosis not present

## 2013-12-06 DIAGNOSIS — E785 Hyperlipidemia, unspecified: Secondary | ICD-10-CM | POA: Diagnosis not present

## 2013-12-06 DIAGNOSIS — J189 Pneumonia, unspecified organism: Secondary | ICD-10-CM | POA: Diagnosis not present

## 2013-12-06 DIAGNOSIS — F329 Major depressive disorder, single episode, unspecified: Secondary | ICD-10-CM | POA: Diagnosis not present

## 2013-12-06 DIAGNOSIS — IMO0001 Reserved for inherently not codable concepts without codable children: Secondary | ICD-10-CM | POA: Diagnosis not present

## 2013-12-06 DIAGNOSIS — F3289 Other specified depressive episodes: Secondary | ICD-10-CM | POA: Diagnosis not present

## 2013-12-07 DIAGNOSIS — IMO0001 Reserved for inherently not codable concepts without codable children: Secondary | ICD-10-CM | POA: Diagnosis not present

## 2013-12-07 DIAGNOSIS — J189 Pneumonia, unspecified organism: Secondary | ICD-10-CM | POA: Diagnosis not present

## 2013-12-07 DIAGNOSIS — F329 Major depressive disorder, single episode, unspecified: Secondary | ICD-10-CM | POA: Diagnosis not present

## 2013-12-07 DIAGNOSIS — E785 Hyperlipidemia, unspecified: Secondary | ICD-10-CM | POA: Diagnosis not present

## 2013-12-07 DIAGNOSIS — E119 Type 2 diabetes mellitus without complications: Secondary | ICD-10-CM | POA: Diagnosis not present

## 2013-12-07 DIAGNOSIS — I10 Essential (primary) hypertension: Secondary | ICD-10-CM | POA: Diagnosis not present

## 2013-12-07 DIAGNOSIS — F3289 Other specified depressive episodes: Secondary | ICD-10-CM | POA: Diagnosis not present

## 2013-12-08 DIAGNOSIS — J189 Pneumonia, unspecified organism: Secondary | ICD-10-CM | POA: Diagnosis not present

## 2013-12-08 DIAGNOSIS — F329 Major depressive disorder, single episode, unspecified: Secondary | ICD-10-CM | POA: Diagnosis not present

## 2013-12-08 DIAGNOSIS — E119 Type 2 diabetes mellitus without complications: Secondary | ICD-10-CM | POA: Diagnosis not present

## 2013-12-08 DIAGNOSIS — IMO0001 Reserved for inherently not codable concepts without codable children: Secondary | ICD-10-CM | POA: Diagnosis not present

## 2013-12-08 DIAGNOSIS — I10 Essential (primary) hypertension: Secondary | ICD-10-CM | POA: Diagnosis not present

## 2013-12-08 DIAGNOSIS — E785 Hyperlipidemia, unspecified: Secondary | ICD-10-CM | POA: Diagnosis not present

## 2013-12-08 DIAGNOSIS — F3289 Other specified depressive episodes: Secondary | ICD-10-CM | POA: Diagnosis not present

## 2013-12-11 DIAGNOSIS — J189 Pneumonia, unspecified organism: Secondary | ICD-10-CM | POA: Diagnosis not present

## 2013-12-11 DIAGNOSIS — M6281 Muscle weakness (generalized): Secondary | ICD-10-CM | POA: Diagnosis not present

## 2013-12-11 DIAGNOSIS — N181 Chronic kidney disease, stage 1: Secondary | ICD-10-CM | POA: Diagnosis not present

## 2013-12-11 DIAGNOSIS — I10 Essential (primary) hypertension: Secondary | ICD-10-CM | POA: Diagnosis not present

## 2013-12-11 DIAGNOSIS — F329 Major depressive disorder, single episode, unspecified: Secondary | ICD-10-CM | POA: Diagnosis not present

## 2013-12-11 DIAGNOSIS — E785 Hyperlipidemia, unspecified: Secondary | ICD-10-CM | POA: Diagnosis not present

## 2013-12-11 DIAGNOSIS — I69959 Hemiplegia and hemiparesis following unspecified cerebrovascular disease affecting unspecified side: Secondary | ICD-10-CM | POA: Diagnosis not present

## 2013-12-11 DIAGNOSIS — J449 Chronic obstructive pulmonary disease, unspecified: Secondary | ICD-10-CM | POA: Diagnosis not present

## 2013-12-11 DIAGNOSIS — K219 Gastro-esophageal reflux disease without esophagitis: Secondary | ICD-10-CM | POA: Diagnosis not present

## 2013-12-11 DIAGNOSIS — F3289 Other specified depressive episodes: Secondary | ICD-10-CM | POA: Diagnosis not present

## 2013-12-11 DIAGNOSIS — I6789 Other cerebrovascular disease: Secondary | ICD-10-CM | POA: Diagnosis not present

## 2013-12-11 DIAGNOSIS — R269 Unspecified abnormalities of gait and mobility: Secondary | ICD-10-CM | POA: Diagnosis not present

## 2013-12-11 DIAGNOSIS — IMO0001 Reserved for inherently not codable concepts without codable children: Secondary | ICD-10-CM | POA: Diagnosis not present

## 2013-12-11 DIAGNOSIS — E119 Type 2 diabetes mellitus without complications: Secondary | ICD-10-CM | POA: Diagnosis not present

## 2013-12-12 DIAGNOSIS — E119 Type 2 diabetes mellitus without complications: Secondary | ICD-10-CM | POA: Diagnosis not present

## 2013-12-12 DIAGNOSIS — F329 Major depressive disorder, single episode, unspecified: Secondary | ICD-10-CM | POA: Diagnosis not present

## 2013-12-12 DIAGNOSIS — IMO0001 Reserved for inherently not codable concepts without codable children: Secondary | ICD-10-CM | POA: Diagnosis not present

## 2013-12-12 DIAGNOSIS — E785 Hyperlipidemia, unspecified: Secondary | ICD-10-CM | POA: Diagnosis not present

## 2013-12-12 DIAGNOSIS — I10 Essential (primary) hypertension: Secondary | ICD-10-CM | POA: Diagnosis not present

## 2013-12-12 DIAGNOSIS — F3289 Other specified depressive episodes: Secondary | ICD-10-CM | POA: Diagnosis not present

## 2013-12-12 DIAGNOSIS — J189 Pneumonia, unspecified organism: Secondary | ICD-10-CM | POA: Diagnosis not present

## 2013-12-13 DIAGNOSIS — F3289 Other specified depressive episodes: Secondary | ICD-10-CM | POA: Diagnosis not present

## 2013-12-13 DIAGNOSIS — I10 Essential (primary) hypertension: Secondary | ICD-10-CM | POA: Diagnosis not present

## 2013-12-13 DIAGNOSIS — IMO0001 Reserved for inherently not codable concepts without codable children: Secondary | ICD-10-CM | POA: Diagnosis not present

## 2013-12-13 DIAGNOSIS — J189 Pneumonia, unspecified organism: Secondary | ICD-10-CM | POA: Diagnosis not present

## 2013-12-13 DIAGNOSIS — E119 Type 2 diabetes mellitus without complications: Secondary | ICD-10-CM | POA: Diagnosis not present

## 2013-12-13 DIAGNOSIS — F329 Major depressive disorder, single episode, unspecified: Secondary | ICD-10-CM | POA: Diagnosis not present

## 2013-12-13 DIAGNOSIS — E785 Hyperlipidemia, unspecified: Secondary | ICD-10-CM | POA: Diagnosis not present

## 2013-12-14 DIAGNOSIS — IMO0001 Reserved for inherently not codable concepts without codable children: Secondary | ICD-10-CM | POA: Diagnosis not present

## 2013-12-14 DIAGNOSIS — E785 Hyperlipidemia, unspecified: Secondary | ICD-10-CM | POA: Diagnosis not present

## 2013-12-14 DIAGNOSIS — F329 Major depressive disorder, single episode, unspecified: Secondary | ICD-10-CM | POA: Diagnosis not present

## 2013-12-14 DIAGNOSIS — J189 Pneumonia, unspecified organism: Secondary | ICD-10-CM | POA: Diagnosis not present

## 2013-12-14 DIAGNOSIS — I10 Essential (primary) hypertension: Secondary | ICD-10-CM | POA: Diagnosis not present

## 2013-12-14 DIAGNOSIS — E119 Type 2 diabetes mellitus without complications: Secondary | ICD-10-CM | POA: Diagnosis not present

## 2013-12-14 DIAGNOSIS — F3289 Other specified depressive episodes: Secondary | ICD-10-CM | POA: Diagnosis not present

## 2013-12-15 DIAGNOSIS — E119 Type 2 diabetes mellitus without complications: Secondary | ICD-10-CM | POA: Diagnosis not present

## 2013-12-15 DIAGNOSIS — J189 Pneumonia, unspecified organism: Secondary | ICD-10-CM | POA: Diagnosis not present

## 2013-12-15 DIAGNOSIS — F329 Major depressive disorder, single episode, unspecified: Secondary | ICD-10-CM | POA: Diagnosis not present

## 2013-12-15 DIAGNOSIS — E785 Hyperlipidemia, unspecified: Secondary | ICD-10-CM | POA: Diagnosis not present

## 2013-12-15 DIAGNOSIS — F3289 Other specified depressive episodes: Secondary | ICD-10-CM | POA: Diagnosis not present

## 2013-12-15 DIAGNOSIS — IMO0001 Reserved for inherently not codable concepts without codable children: Secondary | ICD-10-CM | POA: Diagnosis not present

## 2013-12-15 DIAGNOSIS — I10 Essential (primary) hypertension: Secondary | ICD-10-CM | POA: Diagnosis not present

## 2013-12-18 DIAGNOSIS — IMO0001 Reserved for inherently not codable concepts without codable children: Secondary | ICD-10-CM | POA: Diagnosis not present

## 2013-12-18 DIAGNOSIS — F329 Major depressive disorder, single episode, unspecified: Secondary | ICD-10-CM | POA: Diagnosis not present

## 2013-12-18 DIAGNOSIS — I10 Essential (primary) hypertension: Secondary | ICD-10-CM | POA: Diagnosis not present

## 2013-12-18 DIAGNOSIS — F3289 Other specified depressive episodes: Secondary | ICD-10-CM | POA: Diagnosis not present

## 2013-12-18 DIAGNOSIS — E119 Type 2 diabetes mellitus without complications: Secondary | ICD-10-CM | POA: Diagnosis not present

## 2013-12-18 DIAGNOSIS — E785 Hyperlipidemia, unspecified: Secondary | ICD-10-CM | POA: Diagnosis not present

## 2013-12-18 DIAGNOSIS — J189 Pneumonia, unspecified organism: Secondary | ICD-10-CM | POA: Diagnosis not present

## 2013-12-19 DIAGNOSIS — E119 Type 2 diabetes mellitus without complications: Secondary | ICD-10-CM | POA: Diagnosis not present

## 2013-12-19 DIAGNOSIS — IMO0001 Reserved for inherently not codable concepts without codable children: Secondary | ICD-10-CM | POA: Diagnosis not present

## 2013-12-19 DIAGNOSIS — I10 Essential (primary) hypertension: Secondary | ICD-10-CM | POA: Diagnosis not present

## 2013-12-19 DIAGNOSIS — F329 Major depressive disorder, single episode, unspecified: Secondary | ICD-10-CM | POA: Diagnosis not present

## 2013-12-19 DIAGNOSIS — E785 Hyperlipidemia, unspecified: Secondary | ICD-10-CM | POA: Diagnosis not present

## 2013-12-19 DIAGNOSIS — F3289 Other specified depressive episodes: Secondary | ICD-10-CM | POA: Diagnosis not present

## 2013-12-19 DIAGNOSIS — J189 Pneumonia, unspecified organism: Secondary | ICD-10-CM | POA: Diagnosis not present

## 2013-12-20 DIAGNOSIS — E119 Type 2 diabetes mellitus without complications: Secondary | ICD-10-CM | POA: Diagnosis not present

## 2013-12-20 DIAGNOSIS — E785 Hyperlipidemia, unspecified: Secondary | ICD-10-CM | POA: Diagnosis not present

## 2013-12-20 DIAGNOSIS — F329 Major depressive disorder, single episode, unspecified: Secondary | ICD-10-CM | POA: Diagnosis not present

## 2013-12-20 DIAGNOSIS — IMO0001 Reserved for inherently not codable concepts without codable children: Secondary | ICD-10-CM | POA: Diagnosis not present

## 2013-12-20 DIAGNOSIS — F3289 Other specified depressive episodes: Secondary | ICD-10-CM | POA: Diagnosis not present

## 2013-12-20 DIAGNOSIS — J189 Pneumonia, unspecified organism: Secondary | ICD-10-CM | POA: Diagnosis not present

## 2013-12-20 DIAGNOSIS — I10 Essential (primary) hypertension: Secondary | ICD-10-CM | POA: Diagnosis not present

## 2013-12-21 DIAGNOSIS — F3289 Other specified depressive episodes: Secondary | ICD-10-CM | POA: Diagnosis not present

## 2013-12-21 DIAGNOSIS — F329 Major depressive disorder, single episode, unspecified: Secondary | ICD-10-CM | POA: Diagnosis not present

## 2013-12-21 DIAGNOSIS — E785 Hyperlipidemia, unspecified: Secondary | ICD-10-CM | POA: Diagnosis not present

## 2013-12-21 DIAGNOSIS — I10 Essential (primary) hypertension: Secondary | ICD-10-CM | POA: Diagnosis not present

## 2013-12-21 DIAGNOSIS — J189 Pneumonia, unspecified organism: Secondary | ICD-10-CM | POA: Diagnosis not present

## 2013-12-21 DIAGNOSIS — E119 Type 2 diabetes mellitus without complications: Secondary | ICD-10-CM | POA: Diagnosis not present

## 2013-12-21 DIAGNOSIS — IMO0001 Reserved for inherently not codable concepts without codable children: Secondary | ICD-10-CM | POA: Diagnosis not present

## 2014-01-11 DIAGNOSIS — F329 Major depressive disorder, single episode, unspecified: Secondary | ICD-10-CM | POA: Diagnosis not present

## 2014-01-11 DIAGNOSIS — F3289 Other specified depressive episodes: Secondary | ICD-10-CM | POA: Diagnosis not present

## 2014-02-12 ENCOUNTER — Encounter: Payer: Self-pay | Admitting: Internal Medicine

## 2014-02-12 ENCOUNTER — Non-Acute Institutional Stay (SKILLED_NURSING_FACILITY): Payer: Medicare Other | Admitting: Internal Medicine

## 2014-02-12 DIAGNOSIS — F0393 Unspecified dementia, unspecified severity, with mood disturbance: Secondary | ICD-10-CM

## 2014-02-12 DIAGNOSIS — I10 Essential (primary) hypertension: Secondary | ICD-10-CM

## 2014-02-12 DIAGNOSIS — I2510 Atherosclerotic heart disease of native coronary artery without angina pectoris: Secondary | ICD-10-CM | POA: Diagnosis not present

## 2014-02-12 DIAGNOSIS — J449 Chronic obstructive pulmonary disease, unspecified: Secondary | ICD-10-CM

## 2014-02-12 DIAGNOSIS — I798 Other disorders of arteries, arterioles and capillaries in diseases classified elsewhere: Secondary | ICD-10-CM

## 2014-02-12 DIAGNOSIS — I5032 Chronic diastolic (congestive) heart failure: Secondary | ICD-10-CM

## 2014-02-12 DIAGNOSIS — F3289 Other specified depressive episodes: Secondary | ICD-10-CM | POA: Diagnosis not present

## 2014-02-12 DIAGNOSIS — I509 Heart failure, unspecified: Secondary | ICD-10-CM

## 2014-02-12 DIAGNOSIS — E785 Hyperlipidemia, unspecified: Secondary | ICD-10-CM

## 2014-02-12 DIAGNOSIS — F329 Major depressive disorder, single episode, unspecified: Secondary | ICD-10-CM

## 2014-02-12 DIAGNOSIS — F0280 Dementia in other diseases classified elsewhere without behavioral disturbance: Secondary | ICD-10-CM

## 2014-02-12 DIAGNOSIS — E1159 Type 2 diabetes mellitus with other circulatory complications: Secondary | ICD-10-CM

## 2014-02-12 NOTE — Progress Notes (Signed)
Patient ID: Sheila Flynn, female   DOB: 1946-04-05, 68 y.o.   MRN: [id number]    Sheila Flynn living [place]  Allergies  Allergen Reactions  . Ace Inhibitors Swelling    Chief Complaint  Patient presents with  . Medical Managment of Chronic Issues   HPI 68 y/o female patient with h/o CAD s/p NSTEMI, chronic diastolic CHF, pulmonary hypertension, COPD,CKD, DMII, hyperlipidemia, CVA s/p dysarthria and vascular dementia is here for long term care. She is seen today for routine visit. She complaints of toothache on right upper side for a month now on and off and would like to see her dentist. She denies any mouth sores and difficulty swallowing. She is able to have her meals. She denies any other complaints. No concern from staff  Review of Systems  Constitutional: Negative for fever and chills.  HENT: Negative for congestion.   Eyes: Negative for blurred vision.  Respiratory: Negative for shortness of breath.   Cardiovascular: Negative for chest pain and leg swelling.  Gastrointestinal: Negative for abdominal pain, constipation, blood in stool and melena.  Genitourinary: Negative for dysuria.  Musculoskeletal: Negative for back pain and joint pain.  Skin: Negative for rash.  Neurological: Negative for dizziness.  Endo/Heme/Allergies: Does not bruise/bleed easily.  Psychiatric/Behavioral: has memory loss  Past Medical History  Diagnosis Date  . 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 Chronic diastolic CHF (congestive heart failure)   . CAD (coronary artery disease)     Dr. Matthew Saras  . Angina at rest   . Myocardial infarction   . 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 Hyperlipidemia LDL goal < 100   . Aspiration pneumonia   . COPD (chronic obstructive pulmonary disease)   . Acute respiratory failure 12/21/10  . Pulmonary hypertension   . Chronic constipation   . Rectal cancer     s/p  colostomy  . Thyroid goiter   . Stroke 2011    postop  . 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 Vascular dementia with depressed mood   . Pneumonia 12/21/10  . Delirium, induced by drug     polypharmacy  . Sacral fracture, closed     s/p kyphoplasty  . Fall     history of falls   Past Surgical History  Procedure Laterality Date  . Kyphoplasty      sacrum  . Colostomy      rectal ca   Medication reviewed. See [facility]  Physical exam BP 140/72  Pulse 78  Temp(Src) 98.1 F (36.7 C)  Resp 18  Ht 5\' 7"  (1.702 m)  Wt 189 lb (85.73 kg)  BMI 29.59 kg/m2  Constitutional: She appears well-developed and well-nourished. No distress.  HENT:   Head: Normocephalic and atraumatic.  Right Ear: External ear normal.  Left Ear: External ear normal.   Nose: Nose normal.   Mouth/Throat: Oropharynx is clear and moist.  Eyes: Conjunctivae and EOM are normal. Pupils are equal, round, and reactive to light.  Neck: Neck supple. No JVD present.  Cardiovascular: Normal rate, regular rhythm, normal heart sounds and intact distal pulses.   Pulmonary/Chest: Effort normal. Poor air entry but no expiratory wheeze, crackles or rhonchi noted Abdominal: Soft. Bowel sounds are normal. She exhibits no distension. There is no tenderness. Colostomy site clean Musculoskeletal: Normal range of motion. She exhibits no edema and no tenderness.  Neurological: She is alert. No cranial nerve deficit. She exhibits  normal muscle tone.  Skin: Skin is warm and dry.  Psychiatric: normal affect  Imaging and Procedures: 12/21/10:  CT chest/abd/pelvis:  Neg for PE, enlarge central pulmonary artery c/w pulmonary arterial htn, enlarged thyroid gland compressing trachea just below thoracic level causing sngificant tracehal stenosis, nondisplaced bilateral sacral ala fx extending into S1 foramen bilaterally 12/21/10:  CT head/cspine:  Remote infarct, no acute infarct, cervical spondylosis present, no fx. 08/03/13:   Echo:  Nl to hyperdynamic LV function--EF 62%, LVH, diastolic dysfunction, mild to moderate MR and TR.    08/08/13:  CT chest w/o contrast (abn xray, mediastinal mass?):  Diffusely enlarged thyroid gland likely a thyroid goiter.  Underlying mass cannot be completely excluded.  Small pericardial effusion.  Small nodule right lower thyroid lobe 14mm.  12 month f/u imaging indicated if high risk malignancy.  Assessment/Plan  CAD Remains chest pain free. bp stable. Continue aspirin and atorvastatin 20 mg daily with carvedilol 3.125 mg bid, isosorbide mononitrate 60 mg daily. Has cardiology follow up  Chronic diastolic CHF  Breathing is stable and weight has been stable. Continue b blocker with hydralazine and isosorbide mononitrate.  Depression due to dementia Continue her paroxetine 30 mg daily  COPD  continue albuterol inhaler prn and monitor  Chronic constipation Continue colace, monitor hydration  Hypertensive heart disease bp stable at present, continue amlodipine 10 mg daily and clonidine 0.1 mg bid, hydralazine 10 mg bid and isosorbide mononitrate 60 mg daily  DM type II with vascular complications No recent V0J. Check a1c. Continue glimepiride 2 mg daily and statin, asa. Cannot take ACEI/ARB.  Hyperlipidemia LDL goal < 100 Continue statin, check lipid panel  GERD Continue ranitidine 150 mg daily  Labs- cbc, cmp, tsh, a1c, lipid panel

## 2014-02-14 DIAGNOSIS — E785 Hyperlipidemia, unspecified: Secondary | ICD-10-CM | POA: Diagnosis not present

## 2014-02-14 DIAGNOSIS — E119 Type 2 diabetes mellitus without complications: Secondary | ICD-10-CM | POA: Diagnosis not present

## 2014-02-14 DIAGNOSIS — I10 Essential (primary) hypertension: Secondary | ICD-10-CM | POA: Diagnosis not present

## 2014-02-17 DIAGNOSIS — F0393 Unspecified dementia, unspecified severity, with mood disturbance: Secondary | ICD-10-CM | POA: Insufficient documentation

## 2014-02-17 DIAGNOSIS — F329 Major depressive disorder, single episode, unspecified: Secondary | ICD-10-CM

## 2014-02-17 DIAGNOSIS — F0280 Dementia in other diseases classified elsewhere without behavioral disturbance: Secondary | ICD-10-CM | POA: Insufficient documentation

## 2014-03-21 DIAGNOSIS — F3289 Other specified depressive episodes: Secondary | ICD-10-CM | POA: Diagnosis not present

## 2014-03-21 DIAGNOSIS — F329 Major depressive disorder, single episode, unspecified: Secondary | ICD-10-CM | POA: Diagnosis not present

## 2014-03-22 ENCOUNTER — Non-Acute Institutional Stay (SKILLED_NURSING_FACILITY): Payer: Medicare Other | Admitting: Internal Medicine

## 2014-03-22 DIAGNOSIS — F0153 Vascular dementia, unspecified severity, with mood disturbance: Secondary | ICD-10-CM

## 2014-03-22 DIAGNOSIS — K219 Gastro-esophageal reflux disease without esophagitis: Secondary | ICD-10-CM

## 2014-03-22 DIAGNOSIS — E1159 Type 2 diabetes mellitus with other circulatory complications: Secondary | ICD-10-CM | POA: Diagnosis not present

## 2014-03-22 DIAGNOSIS — F0151 Vascular dementia with behavioral disturbance: Secondary | ICD-10-CM

## 2014-03-22 DIAGNOSIS — I2510 Atherosclerotic heart disease of native coronary artery without angina pectoris: Secondary | ICD-10-CM

## 2014-03-22 DIAGNOSIS — I119 Hypertensive heart disease without heart failure: Secondary | ICD-10-CM

## 2014-03-22 DIAGNOSIS — I798 Other disorders of arteries, arterioles and capillaries in diseases classified elsewhere: Secondary | ICD-10-CM

## 2014-03-22 DIAGNOSIS — F329 Major depressive disorder, single episode, unspecified: Secondary | ICD-10-CM

## 2014-03-22 DIAGNOSIS — E1151 Type 2 diabetes mellitus with diabetic peripheral angiopathy without gangrene: Secondary | ICD-10-CM

## 2014-03-22 NOTE — Progress Notes (Signed)
Patient ID: Sheila Flynn, female   DOB: 08-24-1946, 67 y.o.   MRN: [id number]    [name] living [place] [facility]  Chief Complaint  Patient presents with  . Medical Management of Chronic Issues    routine visit   Allergies  Allergen Reactions  . Ace Inhibitors Swelling   HPI 68 y/o female patient is a long term care resident seen today for routine visit. She is going to see her dentist today. She mentions that her pain has been under control. No other complaints. She has history of vascular dementia, cardiac disease and CVA with dysarthria among others. No concern from staff  Review of Systems   Constitutional: Negative for fever and chills.   HENT: Negative for congestion.    Eyes: Negative for blurred vision.   Respiratory: Negative for shortness of breath.    Cardiovascular: Negative for chest pain and leg swelling.   Gastrointestinal: Negative for abdominal pain, constipation, blood in stool and melena.   Genitourinary: Negative for dysuria.   Musculoskeletal: Negative for back pain and joint pain. Wheels herself around Skin: Negative for rash.   Neurological: Negative for dizziness.   Endo/Heme/Allergies: Does not bruise/bleed easily.   Psychiatric/Behavioral: has memory loss  Medication reviewed. See [facility]  Physical exam BP 140/86  Pulse 76  Temp(Src) 98.6 F (37 C)  Resp 18  Ht 5\' 7"  (1.702 m)  Wt 187 lb (84.823 kg)  BMI 29.28 kg/m2  SpO2 96%  Constitutional: She appears well-developed and well-nourished. No distress.   HENT:   Head: Normocephalic and atraumatic.  Mouth/Throat: Oropharynx is clear and moist.   Eyes: Conjunctivae and EOM are normal. Pupils are equal, round, and reactive to light.   Neck: Neck supple. No JVD present.   Cardiovascular: Normal rate, regular rhythm, normal heart sounds and intact distal pulses.    Pulmonary/Chest: Effort normal. Poor air entry but no expiratory wheeze, crackles or rhonchi noted Abdominal: Soft. Bowel sounds are  normal. She exhibits no distension. There is no tenderness. Colostomy site clean Musculoskeletal: Normal range of motion. She exhibits no edema and no tenderness.  Neurological: She is alert. No cranial nerve deficit. She exhibits normal muscle tone.   Skin: Skin is warm and dry.  Psychiatric: normal affect  02/14/14 Wbc 3.2, hb 11.6, hct 34.1, plt 260, na 141, k 3.9, glu 68, bun 17, cr 1.32, ca 8.6, tsh 0.846, a1c 6.2, t.chol 149, ldl 89, hdl 31, tg 143   Assessment/Plan  DM type II with vascular complications Reviewed recent a1c. Her cbg has also been controlled. Will discontinue her glimeperide with concerns of risk for hypoglycemia for now. Will keep her goal a1c at 7. Monitor cbg twice a week instead of bid for now. Recheck a1c in 3 months. Continue statin with aspirin.   Hypertensive heart disease bp stable at present, continue amlodipine 10 mg daily, coreg 3.125 mg bid, clonidine 0.1 mg bid, hydralazine 10 mg bid and isosorbide mononitrate 60 mg daily. Monitor bp and renal function  CAD Remains chest pain free. bp stable. Continue carvedilol 3.125 mg bid, isosorbide mononitrate 60 mg daily with aspirin and atorvastatin 20 mg daily   Vascular dementia Continue bp medications, monitor mood, continue paroxetine 30 mg daily to help with her mood  GERD Continue ranitidine 150 mg daily

## 2014-03-31 DIAGNOSIS — K219 Gastro-esophageal reflux disease without esophagitis: Secondary | ICD-10-CM | POA: Insufficient documentation

## 2014-03-31 DIAGNOSIS — I119 Hypertensive heart disease without heart failure: Secondary | ICD-10-CM | POA: Insufficient documentation

## 2014-04-12 ENCOUNTER — Encounter: Payer: Self-pay | Admitting: Internal Medicine

## 2014-04-23 DIAGNOSIS — E119 Type 2 diabetes mellitus without complications: Secondary | ICD-10-CM | POA: Diagnosis not present

## 2014-04-23 DIAGNOSIS — H04129 Dry eye syndrome of unspecified lacrimal gland: Secondary | ICD-10-CM | POA: Diagnosis not present

## 2014-04-23 DIAGNOSIS — H2510 Age-related nuclear cataract, unspecified eye: Secondary | ICD-10-CM | POA: Diagnosis not present

## 2014-04-30 DIAGNOSIS — F3289 Other specified depressive episodes: Secondary | ICD-10-CM | POA: Diagnosis not present

## 2014-04-30 DIAGNOSIS — F329 Major depressive disorder, single episode, unspecified: Secondary | ICD-10-CM | POA: Diagnosis not present

## 2014-05-10 ENCOUNTER — Non-Acute Institutional Stay (SKILLED_NURSING_FACILITY): Payer: Medicare Other | Admitting: Internal Medicine

## 2014-05-10 DIAGNOSIS — I10 Essential (primary) hypertension: Secondary | ICD-10-CM

## 2014-05-10 NOTE — Progress Notes (Signed)
Patient ID: Sheila Flynn, female   DOB: 07-05-1946, 68 y.o.   MRN: [id number]    Facility: [facility]  Chief Complaint  Patient presents with  . Acute Visit    elevated BP   Allergies  Allergen Reactions  . Ace Inhibitors Swelling   HPI 68 y/o female patient seen for elevated bp readings. Reviewed her readings. SBP 150-175. DBP 70-90. Patient denies any headache or chest pain or dyspnea associated symptoms. Denies abdominal pain. Complaint with her medications and diet in facility. No other concern from staff. She has history of vascular dementia, cardiac disease and CVA with dysarthria. No other concern from staff  Review of Systems   Constitutional: Negative for fever and chills.      Eyes: Negative for blurred vision.   Respiratory: Negative for shortness of breath.    Cardiovascular: Negative for chest pain and leg swelling.   Gastrointestinal: Negative for abdominal pain Neurological: Negative for dizziness.    Current Outpatient Prescriptions on File Prior to Visit  Medication Sig Dispense Refill  . acetaminophen (TYLENOL) 325 MG tablet Take 650 mg by mouth every 6 (six) hours as needed for mild pain.      Marland Kitchen albuterol (PROVENTIL HFA;VENTOLIN HFA) 108 (90 BASE) MCG/ACT inhaler Inhale 2 puffs into the lungs every 4 (four) hours as needed for wheezing or shortness of breath.      Marland Kitchen amLODipine (NORVASC) 10 MG tablet Take 10 mg by mouth daily.      Marland Kitchen aspirin (ASPIRIN EC) 81 MG EC tablet Take 81 mg by mouth daily. Swallow whole.      Marland Kitchen atorvastatin (LIPITOR) 20 MG tablet Take 20 mg by mouth at bedtime.      . carvedilol (COREG) 3.125 MG tablet Take 3.125 mg by mouth 2 (two) times daily with a meal.      . cloNIDine (CATAPRES) 0.1 MG tablet Take 0.1 mg by mouth every 8 (eight) hours as needed (sbp>160).      . cloNIDine (CATAPRES) 0.1 MG tablet Take 0.1 mg by mouth 2 (two) times daily.      Marland Kitchen docusate sodium (COLACE) 100 MG capsule Take 100 mg by mouth 3  (three) times daily.      Marland Kitchen glimepiride (AMARYL) 2 MG tablet Take 2 mg by mouth daily with breakfast.      . hydrALAZINE (APRESOLINE) 10 MG tablet Take 10 mg by mouth 2 (two) times daily.      . isosorbide mononitrate (ISMO,MONOKET) 20 MG tablet Take 20 mg by mouth daily.      Marland Kitchen PARoxetine (PAXIL) 20 MG tablet Take 30 mg by mouth daily.       . polyvinyl alcohol (LIQUIFILM TEARS) 1.4 % ophthalmic solution Place 2 drops into both eyes 3 (three) times daily as needed for dry eyes.      . ranitidine (ZANTAC) 150 MG capsule Take 150 mg by mouth every evening.       No current facility-administered medications on file prior to visit.    Physical exam BP 170/98  Pulse 76  Temp(Src) 97.3 F (36.3 C)  Resp 18  Ht 5\' 7"  (1.702 m)  Wt 186 lb (84.369 kg)  BMI 29.12 kg/m2  SpO2 96%  Constitutional: She appears well-developed and well-nourished. No distress.   HENT:   Head: Normocephalic and atraumatic.  Neck: Neck supple. No JVD present.   Cardiovascular: Normal rate, regular rhythm, normal heart sounds  Pulmonary/Chest: Effort normal.  Abdominal: Soft. Bowel sounds are  normal.  Musculoskeletal: She exhibits no edema  Neurological: She is alert.  Labs 02/14/14 Wbc 3.2, hb 11.6, hct 34.1, plt 260, na 141, k 3.9, glu 68, bun 17, cr 1.32, ca 8.6, tsh 0.846, a1c 6.2, t.chol 149, ldl 89, hdl 31, tg 143   Assessment/Plan  Uncontrolled HTN Change hydralazine to 25 mg po tid Continue clonidine 0.1 mg bid with additional 0.1 mg daily prn Continue amlodipine 10 mg daily Continue coreg 3.125 bid Continue isosorbide mononitrate 60 mg daily Check bp q shift and reassess in a week

## 2014-05-16 DIAGNOSIS — E119 Type 2 diabetes mellitus without complications: Secondary | ICD-10-CM | POA: Diagnosis not present

## 2014-06-06 ENCOUNTER — Encounter: Payer: Self-pay | Admitting: Cardiovascular Disease

## 2014-06-06 ENCOUNTER — Ambulatory Visit (INDEPENDENT_AMBULATORY_CARE_PROVIDER_SITE_OTHER): Payer: Medicare Other | Admitting: Cardiovascular Disease

## 2014-06-06 VITALS — BP 209/92 | HR 76 | Ht 67.0 in | Wt 184.8 lb

## 2014-06-06 DIAGNOSIS — I10 Essential (primary) hypertension: Secondary | ICD-10-CM | POA: Diagnosis not present

## 2014-06-06 DIAGNOSIS — I2584 Coronary atherosclerosis due to calcified coronary lesion: Secondary | ICD-10-CM

## 2014-06-06 DIAGNOSIS — E785 Hyperlipidemia, unspecified: Secondary | ICD-10-CM | POA: Diagnosis not present

## 2014-06-06 DIAGNOSIS — I219 Acute myocardial infarction, unspecified: Secondary | ICD-10-CM

## 2014-06-06 DIAGNOSIS — I2510 Atherosclerotic heart disease of native coronary artery without angina pectoris: Secondary | ICD-10-CM

## 2014-06-06 NOTE — Assessment & Plan Note (Signed)
On statin therapy followed by her PCP 

## 2014-06-06 NOTE — Assessment & Plan Note (Signed)
Blood pressure not controlled on current medications which include amlodipine, carvedilol, clonidine, and hydralazine. This will be followed and addressed  by her primary care physician

## 2014-06-06 NOTE — Progress Notes (Signed)
06/06/2014 Sheila Flynn   1946-09-27  [id number]  Primary Physician Hollace Kinnier, DO Primary Cardiologist: Lorretta Harp MD Sheila Flynn   HPI:  Ms. Lorenz is a 68 year old moderately overweight divorced African American female mother of 3 children referred by [facility] because of question of congestive heart failure and chest pain. Records in her EHR are  chart are somewhat sparse. She has been wheelchair-bound for 3 years for unclear reasons. Problems listed in her chart include hypertension, hyperlipidemia, coronary artery disease and prior myocardial infarction, stroke vascular dementia, COPD and hypertensive heart disease. In reviewing her most recent progress notes from her primary care providers, Dr. Guinevere Ferrari , there do not seem to be any active cardiovascular issues. It is hard to get a clear history on her regarding current symptoms because she tenably endorses and/or denies symptoms of chest pain.   Current Outpatient Prescriptions  Medication Sig Dispense Refill  . acetaminophen (TYLENOL) 325 MG tablet Take 650 mg by mouth every 6 (six) hours as needed for mild pain.      Marland Kitchen albuterol (PROVENTIL HFA;VENTOLIN HFA) 108 (90 BASE) MCG/ACT inhaler Inhale 2 puffs into the lungs every 4 (four) hours as needed for wheezing or shortness of breath.      Marland Kitchen amLODipine (NORVASC) 10 MG tablet Take 10 mg by mouth daily.      Marland Kitchen aspirin (ASPIRIN EC) 81 MG EC tablet Take 81 mg by mouth daily. Swallow whole.      Marland Kitchen atorvastatin (LIPITOR) 20 MG tablet Take 20 mg by mouth at bedtime.      . carvedilol (COREG) 3.125 MG tablet Take 3.125 mg by mouth 2 (two) times daily with a meal.      . cloNIDine (CATAPRES) 0.1 MG tablet Take 0.1 mg by mouth every 8 (eight) hours as needed (sbp>160).      . cloNIDine (CATAPRES) 0.1 MG tablet Take 0.1 mg by mouth 2 (two) times daily.      Marland Kitchen docusate sodium (COLACE) 100 MG capsule Take 100 mg by mouth 3 (three) times daily.      .  hydrALAZINE (APRESOLINE) 10 MG tablet Take 10 mg by mouth 2 (two) times daily.      . isosorbide mononitrate (ISMO,MONOKET) 20 MG tablet Take 20 mg by mouth daily.      Marland Kitchen PARoxetine (PAXIL) 20 MG tablet Take 30 mg by mouth daily.       . polyvinyl alcohol (LIQUIFILM TEARS) 1.4 % ophthalmic solution Place 2 drops into both eyes 3 (three) times daily as needed for dry eyes.      . ranitidine (ZANTAC) 150 MG capsule Take 150 mg by mouth every evening.      . sennosides-docusate sodium (SENOKOT-S) 8.6-50 MG tablet Take 2 tablets by mouth daily.      . traMADol (ULTRAM) 50 MG tablet Take by mouth every 8 (eight) hours as needed.      . Vitamin D, Ergocalciferol, (DRISDOL) 50000 UNITS CAPS capsule Take 50,000 Units by mouth once.       No current facility-administered medications for this visit.    Allergies  Allergen Reactions  . Ace Inhibitors Swelling    History   Social History  . Marital Status: Widowed    Spouse Name: N/A    Number of Children: N/A  . Years of Education: N/A   Occupational History  .      custodial   Social History Main Topics  . Smoking status:  Former Smoker    Types: Cigarettes  . Smokeless tobacco: Not on file  . Alcohol Use: No  . Drug Use: Yes     Comment: previously smoked crack, has had positive UDS for amphetamines and narcotics  . Sexual Activity: Not on file   Other Topics Concern  . Not on file   Social History Narrative   Born in [place], has some high school education, retired from custodian job, divorced, has children     Review of Systems: General: negative for chills, fever, night sweats or weight changes.  Cardiovascular: negative for chest pain, dyspnea on exertion, edema, orthopnea, palpitations, paroxysmal nocturnal dyspnea or shortness of breath Dermatological: negative for rash Respiratory: negative for cough or wheezing Urologic: negative for hematuria Abdominal: negative for nausea, vomiting, diarrhea, bright red blood  per rectum, melena, or hematemesis Neurologic: negative for visual changes, syncope, or dizziness All other systems reviewed and are otherwise negative except as noted above.    Blood pressure 209/92, pulse 76, height 5\' 7"  (1.702 m), weight 184 lb 12.8 oz (83.825 kg).  General appearance: alert and slowed mentation Neck: no adenopathy, no carotid bruit, no JVD, supple, symmetrical, trachea midline and thyroid not enlarged, symmetric, no tenderness/mass/nodules Lungs: clear to auscultation bilaterally Heart: regular rate and rhythm, S1, S2 normal, no murmur, click, rub or gallop Extremities: extremities normal, atraumatic, no cyanosis or edema and 1+ pedal pulses bilaterally  EKG normal sinus rhythm at 76 with a bundle-branch block  ASSESSMENT AND PLAN:   HYPERTENSION Blood pressure not controlled on current medications which include amlodipine, carvedilol, clonidine, and hydralazine. This will be followed and addressed  by her primary care physician  Hyperlipidemia with target LDL less than 100 On statin therapy followed by her PCP  Hypertensive cardiovascular disease There is no 2-D echo and her chart to corroborate this. It is hard to get a history out of her but she is wheelchair-bound and does not appear to be short of breath.  MYOCARDIAL INFARCTION History of myocardial infarction apparently a possibly years ago. There are no imaging studies in the chart. It is hard to get a history of chest pain from her since she is somewhat dysarthric from a prior stroke. She is already on triple antianginal therapy. I do not think further workup is indicated.      Lorretta Harp MD FACP,FACC,FAHA, [facility] 06/06/2014 8:33 AM

## 2014-06-06 NOTE — Patient Instructions (Signed)
No changes with current medication  Your physician recommends that you schedule a follow-up appointment  On as needed basis

## 2014-06-06 NOTE — Assessment & Plan Note (Signed)
History of myocardial infarction apparently a possibly years ago. There are no imaging studies in the chart. It is hard to get a history of chest pain from her since she is somewhat dysarthric from a prior stroke. She is already on triple antianginal therapy. I do not think further workup is indicated.

## 2014-06-06 NOTE — Assessment & Plan Note (Signed)
There is no 2-D echo and her chart to corroborate this. It is hard to get a history out of her but she is wheelchair-bound and does not appear to be short of breath.

## 2014-06-26 DIAGNOSIS — B351 Tinea unguium: Secondary | ICD-10-CM | POA: Diagnosis not present

## 2014-06-26 DIAGNOSIS — M2010 Hallux valgus (acquired), unspecified foot: Secondary | ICD-10-CM | POA: Diagnosis not present

## 2014-06-26 DIAGNOSIS — E119 Type 2 diabetes mellitus without complications: Secondary | ICD-10-CM | POA: Diagnosis not present

## 2014-06-26 DIAGNOSIS — M79609 Pain in unspecified limb: Secondary | ICD-10-CM | POA: Diagnosis not present

## 2014-06-29 ENCOUNTER — Non-Acute Institutional Stay (SKILLED_NURSING_FACILITY): Payer: Medicare Other | Admitting: Internal Medicine

## 2014-06-29 ENCOUNTER — Encounter: Payer: Self-pay | Admitting: Internal Medicine

## 2014-06-29 DIAGNOSIS — E1151 Type 2 diabetes mellitus with diabetic peripheral angiopathy without gangrene: Secondary | ICD-10-CM

## 2014-06-29 DIAGNOSIS — F0153 Vascular dementia, unspecified severity, with mood disturbance: Secondary | ICD-10-CM

## 2014-06-29 DIAGNOSIS — I509 Heart failure, unspecified: Secondary | ICD-10-CM

## 2014-06-29 DIAGNOSIS — F0151 Vascular dementia with behavioral disturbance: Secondary | ICD-10-CM

## 2014-06-29 DIAGNOSIS — E1159 Type 2 diabetes mellitus with other circulatory complications: Secondary | ICD-10-CM | POA: Diagnosis not present

## 2014-06-29 DIAGNOSIS — I110 Hypertensive heart disease with heart failure: Secondary | ICD-10-CM

## 2014-06-29 DIAGNOSIS — E049 Nontoxic goiter, unspecified: Secondary | ICD-10-CM

## 2014-06-29 DIAGNOSIS — I798 Other disorders of arteries, arterioles and capillaries in diseases classified elsewhere: Secondary | ICD-10-CM

## 2014-06-29 DIAGNOSIS — J441 Chronic obstructive pulmonary disease with (acute) exacerbation: Secondary | ICD-10-CM

## 2014-06-29 DIAGNOSIS — F329 Major depressive disorder, single episode, unspecified: Secondary | ICD-10-CM

## 2014-06-29 DIAGNOSIS — I5032 Chronic diastolic (congestive) heart failure: Secondary | ICD-10-CM

## 2014-06-29 NOTE — Progress Notes (Signed)
Patient ID: Sheila Flynn, female   DOB: 1946-06-24, 68 y.o.   MRN: [id number]    Facility: [facility]  Chief Complaint  Patient presents with  . Medical Management of Chronic Issues   Allergies  Allergen Reactions  . Ace Inhibitors Swelling   HPI 68 y/o female patient with h/o CAD s/p NSTEMI, chronic diastolic CHF, pulmonary hypertension, COPD,CKD, DMII, hyperlipidemia, CVA s/p dysarthria and vascular dementia is here for long term care. She is seen today for routine visit. She denies any complaints. She coughs during the visit and she sounds congested. She mentions having cough for a week now and she feeling stuffed in her chest on asking. Denies chest pain. Feels short of breath at times. No concern from staff. cbg 104-136 in am SBP 140-174  Review of Systems   Constitutional: Negative for fever and chills.   HENT: Negative for sore throat, runny nose.    Eyes: Negative for blurred vision.   Respiratory: Negative for wheezing    Cardiovascular: Negative for chest pain and leg swelling.   Gastrointestinal: Negative for abdominal pain, constipation, blood in stool and melena.  has ostomy bag Genitourinary: Negative for dysuria.   Musculoskeletal: Negative for back pain and joint pain.   Skin: Negative for rash.   Neurological: Negative for dizziness.   Endo/Heme/Allergies: Does not bruise/bleed easily.   Psychiatric/Behavioral: has memory loss  Past Medical History  Diagnosis Date  . 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 Chronic diastolic CHF (congestive heart failure)   . CAD (coronary artery disease)     Dr. Matthew Saras  . Angina at rest   . Myocardial infarction   . 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 Hyperlipidemia LDL goal < 100   . Aspiration pneumonia   . COPD (chronic obstructive pulmonary disease)   . Acute respiratory failure 12/21/10  . Pulmonary  hypertension   . Chronic constipation   . Rectal cancer     s/p colostomy  . Thyroid goiter   . Stroke 2011    postop  . 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 Vascular dementia with depressed mood   . Pneumonia 12/21/10  . Delirium, induced by drug     polypharmacy  . Sacral fracture, closed     s/p kyphoplasty  . Fall     history of falls   Current Outpatient Prescriptions on File Prior to Visit  Medication Sig Dispense Refill  . acetaminophen (TYLENOL) 325 MG tablet Take 650 mg by mouth every 6 (six) hours as needed for mild pain.      Marland Kitchen albuterol (PROVENTIL HFA;VENTOLIN HFA) 108 (90 BASE) MCG/ACT inhaler Inhale 2 puffs into the lungs every 4 (four) hours as needed for wheezing or shortness of breath.      Marland Kitchen amLODipine (NORVASC) 10 MG tablet Take 10 mg by mouth daily.      Marland Kitchen aspirin (ASPIRIN EC) 81 MG EC tablet Take 81 mg by mouth daily. Swallow whole.      Marland Kitchen atorvastatin (LIPITOR) 20 MG tablet Take 20 mg by mouth at bedtime.      . carvedilol (COREG) 3.125 MG tablet Take 3.125 mg by mouth 2 (two) times daily with a meal.      . cloNIDine (CATAPRES) 0.1 MG tablet Take 0.1 mg by mouth every 8 (eight) hours as needed (sbp>160).      . cloNIDine (CATAPRES) 0.1 MG  tablet Take 0.1 mg by mouth 2 (two) times daily.      Marland Kitchen docusate sodium (COLACE) 100 MG capsule Take 100 mg by mouth 3 (three) times daily.      . hydrALAZINE (APRESOLINE) 10 MG tablet Take 10 mg by mouth 2 (two) times daily.      . isosorbide mononitrate (ISMO,MONOKET) 20 MG tablet Take 20 mg by mouth daily.      Marland Kitchen PARoxetine (PAXIL) 20 MG tablet Take 30 mg by mouth daily.       . polyvinyl alcohol (LIQUIFILM TEARS) 1.4 % ophthalmic solution Place 2 drops into both eyes 3 (three) times daily as needed for dry eyes.      . ranitidine (ZANTAC) 150 MG capsule Take 150 mg by mouth every evening.      . sennosides-docusate sodium (SENOKOT-S) 8.6-50 MG tablet Take 2 tablets by mouth daily.      . traMADol  (ULTRAM) 50 MG tablet Take by mouth every 8 (eight) hours as needed.      . Vitamin D, Ergocalciferol, (DRISDOL) 50000 UNITS CAPS capsule Take 50,000 Units by mouth once.       No current facility-administered medications on file prior to visit.   Physical exam BP 148/80  Pulse 85  Temp(Src) 97.8 F (36.6 C)  Resp 18  Ht 5\' 7"  (1.702 m)  Wt 185 lb (83.915 kg)  BMI 28.97 kg/m2  SpO2 95%  Constitutional: She appears well-developed and well-nourished. No distress. obese HENT:   Head: Normocephalic and atraumatic.   Nose: Nose normal.   Mouth/Throat: Oropharynx is clear and moist.   Eyes: Conjunctivae has mild erythema. Pupils are equal, round, and reactive to light.   Neck: Neck supple. No JVD present.   Cardiovascular: Normal rate, regular rhythm, normal heart sounds and intact distal pulses.    Pulmonary/Chest: Effort normal. Poor air entry and bilateral rhonchi. Faint wheezing present. No crackles.  Abdominal: Soft. Bowel sounds are normal. She exhibits no distension. There is no tenderness. Colostomy site clean Musculoskeletal: Normal range of motion. She exhibits no edema and no tenderness.  Neurological: She is alert. No cranial nerve deficit. She exhibits normal muscle tone.   Skin: Skin is warm and dry.  Psychiatric: normal affect  Imaging and Procedures: 12/21/10:  CT chest/abd/pelvis:  Neg for PE, enlarge central pulmonary artery c/w pulmonary arterial htn, enlarged thyroid gland compressing trachea just below thoracic level causing sngificant tracehal stenosis, nondisplaced bilateral sacral ala fx extending into S1 foramen bilaterally 12/21/10:  CT head/cspine:  Remote infarct, no acute infarct, cervical spondylosis present, no fx. 08/03/13:  Echo:  Nl to hyperdynamic LV function--EF 29%, LVH, diastolic dysfunction, mild to moderate MR and TR.    08/08/13:  CT chest w/o contrast (abn xray, mediastinal mass?):  Diffusely enlarged thyroid gland likely a thyroid goiter.   Underlying mass cannot be completely excluded.  Small pericardial effusion.  Small nodule right lower thyroid lobe 91mm.  12 month f/u imaging indicated if high risk malignancy.  Labs- 05/16/14 a1c 6.7 02/14/14 wbc 3.2, hb 11.6, hct 34.1, plt 260, na 141, k 3.9, glu 68, bun 17, cr 1.32, lft wnl, t.chol 149,tri 143, hdl 31, ldl 89, a1c 6.2  Assessment/Plan  copd exacerbation  D/c ventolin and add duoneb q6h for now. prednisone 40 mg daily for 5 days with z pack. Reassess next visit and if no improvement, will get cxr to rule out pneumonia vs pulmonary vascular congestion  enlarged thyroid gland Seen on ct scan in  9/14. Check thyroid panel  DM with a1c of 6.7, obesity, CAD Start glipizide 5 mg daily. Check cbg once a day. Continue asa and statin  HTN with bp elevated, increase hydralazine to 25 mg qid. Continue clonidine, carvedilol, isosorbide  Chronic diastolic CHF   weight has been stable. Continue b blocker with hydralazine (dose increased) and isosorbide mononitrate.  Wt Readings from Last 3 Encounters:  06/29/14 185 lb (83.915 kg)  06/06/14 184 lb 12.8 oz (83.825 kg)  05/10/14 186 lb (84.369 kg)    Depression due to dementia Continue her paroxetine 30 mg daily

## 2014-07-02 DIAGNOSIS — D649 Anemia, unspecified: Secondary | ICD-10-CM | POA: Diagnosis not present

## 2014-07-02 DIAGNOSIS — I10 Essential (primary) hypertension: Secondary | ICD-10-CM | POA: Diagnosis not present

## 2014-07-02 DIAGNOSIS — E039 Hypothyroidism, unspecified: Secondary | ICD-10-CM | POA: Diagnosis not present

## 2014-07-04 DIAGNOSIS — E119 Type 2 diabetes mellitus without complications: Secondary | ICD-10-CM | POA: Diagnosis not present

## 2014-07-04 DIAGNOSIS — J449 Chronic obstructive pulmonary disease, unspecified: Secondary | ICD-10-CM | POA: Diagnosis not present

## 2014-07-04 DIAGNOSIS — F329 Major depressive disorder, single episode, unspecified: Secondary | ICD-10-CM | POA: Diagnosis not present

## 2014-07-04 DIAGNOSIS — I6789 Other cerebrovascular disease: Secondary | ICD-10-CM | POA: Diagnosis not present

## 2014-07-04 DIAGNOSIS — N181 Chronic kidney disease, stage 1: Secondary | ICD-10-CM | POA: Diagnosis not present

## 2014-07-04 DIAGNOSIS — I69959 Hemiplegia and hemiparesis following unspecified cerebrovascular disease affecting unspecified side: Secondary | ICD-10-CM | POA: Diagnosis not present

## 2014-07-04 DIAGNOSIS — F0280 Dementia in other diseases classified elsewhere without behavioral disturbance: Secondary | ICD-10-CM | POA: Diagnosis not present

## 2014-07-04 DIAGNOSIS — E785 Hyperlipidemia, unspecified: Secondary | ICD-10-CM | POA: Diagnosis not present

## 2014-07-04 DIAGNOSIS — I10 Essential (primary) hypertension: Secondary | ICD-10-CM | POA: Diagnosis not present

## 2014-07-04 DIAGNOSIS — R1310 Dysphagia, unspecified: Secondary | ICD-10-CM | POA: Diagnosis not present

## 2014-07-04 DIAGNOSIS — K219 Gastro-esophageal reflux disease without esophagitis: Secondary | ICD-10-CM | POA: Diagnosis not present

## 2014-07-04 DIAGNOSIS — F3289 Other specified depressive episodes: Secondary | ICD-10-CM | POA: Diagnosis not present

## 2014-07-04 DIAGNOSIS — J189 Pneumonia, unspecified organism: Secondary | ICD-10-CM | POA: Diagnosis not present

## 2014-07-04 DIAGNOSIS — M6281 Muscle weakness (generalized): Secondary | ICD-10-CM | POA: Diagnosis not present

## 2014-07-04 DIAGNOSIS — IMO0001 Reserved for inherently not codable concepts without codable children: Secondary | ICD-10-CM | POA: Diagnosis not present

## 2014-07-04 DIAGNOSIS — R41841 Cognitive communication deficit: Secondary | ICD-10-CM | POA: Diagnosis not present

## 2014-07-05 DIAGNOSIS — J189 Pneumonia, unspecified organism: Secondary | ICD-10-CM | POA: Diagnosis not present

## 2014-07-05 DIAGNOSIS — F329 Major depressive disorder, single episode, unspecified: Secondary | ICD-10-CM | POA: Diagnosis not present

## 2014-07-05 DIAGNOSIS — I10 Essential (primary) hypertension: Secondary | ICD-10-CM | POA: Diagnosis not present

## 2014-07-05 DIAGNOSIS — E119 Type 2 diabetes mellitus without complications: Secondary | ICD-10-CM | POA: Diagnosis not present

## 2014-07-05 DIAGNOSIS — F3289 Other specified depressive episodes: Secondary | ICD-10-CM | POA: Diagnosis not present

## 2014-07-05 DIAGNOSIS — IMO0001 Reserved for inherently not codable concepts without codable children: Secondary | ICD-10-CM | POA: Diagnosis not present

## 2014-07-05 DIAGNOSIS — E785 Hyperlipidemia, unspecified: Secondary | ICD-10-CM | POA: Diagnosis not present

## 2014-07-06 DIAGNOSIS — IMO0001 Reserved for inherently not codable concepts without codable children: Secondary | ICD-10-CM | POA: Diagnosis not present

## 2014-07-06 DIAGNOSIS — J189 Pneumonia, unspecified organism: Secondary | ICD-10-CM | POA: Diagnosis not present

## 2014-07-06 DIAGNOSIS — F329 Major depressive disorder, single episode, unspecified: Secondary | ICD-10-CM | POA: Diagnosis not present

## 2014-07-06 DIAGNOSIS — E119 Type 2 diabetes mellitus without complications: Secondary | ICD-10-CM | POA: Diagnosis not present

## 2014-07-06 DIAGNOSIS — I10 Essential (primary) hypertension: Secondary | ICD-10-CM | POA: Diagnosis not present

## 2014-07-06 DIAGNOSIS — E785 Hyperlipidemia, unspecified: Secondary | ICD-10-CM | POA: Diagnosis not present

## 2014-07-06 DIAGNOSIS — F3289 Other specified depressive episodes: Secondary | ICD-10-CM | POA: Diagnosis not present

## 2014-07-09 DIAGNOSIS — J189 Pneumonia, unspecified organism: Secondary | ICD-10-CM | POA: Diagnosis not present

## 2014-07-09 DIAGNOSIS — E785 Hyperlipidemia, unspecified: Secondary | ICD-10-CM | POA: Diagnosis not present

## 2014-07-09 DIAGNOSIS — F329 Major depressive disorder, single episode, unspecified: Secondary | ICD-10-CM | POA: Diagnosis not present

## 2014-07-09 DIAGNOSIS — I10 Essential (primary) hypertension: Secondary | ICD-10-CM | POA: Diagnosis not present

## 2014-07-09 DIAGNOSIS — F3289 Other specified depressive episodes: Secondary | ICD-10-CM | POA: Diagnosis not present

## 2014-07-09 DIAGNOSIS — E119 Type 2 diabetes mellitus without complications: Secondary | ICD-10-CM | POA: Diagnosis not present

## 2014-07-09 DIAGNOSIS — IMO0001 Reserved for inherently not codable concepts without codable children: Secondary | ICD-10-CM | POA: Diagnosis not present

## 2014-07-10 DIAGNOSIS — E785 Hyperlipidemia, unspecified: Secondary | ICD-10-CM | POA: Diagnosis not present

## 2014-07-10 DIAGNOSIS — F0280 Dementia in other diseases classified elsewhere without behavioral disturbance: Secondary | ICD-10-CM | POA: Diagnosis not present

## 2014-07-10 DIAGNOSIS — R41841 Cognitive communication deficit: Secondary | ICD-10-CM | POA: Diagnosis not present

## 2014-07-10 DIAGNOSIS — IMO0001 Reserved for inherently not codable concepts without codable children: Secondary | ICD-10-CM | POA: Diagnosis not present

## 2014-07-10 DIAGNOSIS — I10 Essential (primary) hypertension: Secondary | ICD-10-CM | POA: Diagnosis not present

## 2014-07-10 DIAGNOSIS — F3289 Other specified depressive episodes: Secondary | ICD-10-CM | POA: Diagnosis not present

## 2014-07-10 DIAGNOSIS — I69959 Hemiplegia and hemiparesis following unspecified cerebrovascular disease affecting unspecified side: Secondary | ICD-10-CM | POA: Diagnosis not present

## 2014-07-10 DIAGNOSIS — K219 Gastro-esophageal reflux disease without esophagitis: Secondary | ICD-10-CM | POA: Diagnosis not present

## 2014-07-10 DIAGNOSIS — I6789 Other cerebrovascular disease: Secondary | ICD-10-CM | POA: Diagnosis not present

## 2014-07-10 DIAGNOSIS — M6281 Muscle weakness (generalized): Secondary | ICD-10-CM | POA: Diagnosis not present

## 2014-07-10 DIAGNOSIS — F329 Major depressive disorder, single episode, unspecified: Secondary | ICD-10-CM | POA: Diagnosis not present

## 2014-07-10 DIAGNOSIS — J189 Pneumonia, unspecified organism: Secondary | ICD-10-CM | POA: Diagnosis not present

## 2014-07-10 DIAGNOSIS — N181 Chronic kidney disease, stage 1: Secondary | ICD-10-CM | POA: Diagnosis not present

## 2014-07-10 DIAGNOSIS — J449 Chronic obstructive pulmonary disease, unspecified: Secondary | ICD-10-CM | POA: Diagnosis not present

## 2014-07-10 DIAGNOSIS — E119 Type 2 diabetes mellitus without complications: Secondary | ICD-10-CM | POA: Diagnosis not present

## 2014-07-10 DIAGNOSIS — R1310 Dysphagia, unspecified: Secondary | ICD-10-CM | POA: Diagnosis not present

## 2014-07-11 ENCOUNTER — Other Ambulatory Visit: Payer: Self-pay | Admitting: Internal Medicine

## 2014-07-11 DIAGNOSIS — E049 Nontoxic goiter, unspecified: Secondary | ICD-10-CM

## 2014-07-17 ENCOUNTER — Ambulatory Visit (HOSPITAL_COMMUNITY)
Admission: RE | Admit: 2014-07-17 | Discharge: 2014-07-17 | Disposition: A | Discharge status: Home / Self Care | Payer: Medicare Other | Source: Ambulatory Visit | Attending: Internal Medicine | Admitting: Internal Medicine

## 2014-07-17 DIAGNOSIS — E049 Nontoxic goiter, unspecified: Secondary | ICD-10-CM | POA: Diagnosis not present

## 2014-07-17 DIAGNOSIS — E042 Nontoxic multinodular goiter: Secondary | ICD-10-CM | POA: Diagnosis not present

## 2014-07-30 DIAGNOSIS — F3289 Other specified depressive episodes: Secondary | ICD-10-CM | POA: Diagnosis not present

## 2014-07-30 DIAGNOSIS — F329 Major depressive disorder, single episode, unspecified: Secondary | ICD-10-CM | POA: Diagnosis not present

## 2014-08-01 ENCOUNTER — Other Ambulatory Visit: Payer: Self-pay | Admitting: Internal Medicine

## 2014-08-01 DIAGNOSIS — E041 Nontoxic single thyroid nodule: Secondary | ICD-10-CM

## 2014-08-03 ENCOUNTER — Encounter (HOSPITAL_COMMUNITY): Payer: Self-pay | Admitting: Pharmacy Technician

## 2014-08-07 ENCOUNTER — Ambulatory Visit (HOSPITAL_COMMUNITY)
Admission: RE | Admit: 2014-08-07 | Discharge: 2014-08-07 | Disposition: A | Discharge status: Home / Self Care | Payer: Medicare Other | Source: Ambulatory Visit | Attending: Internal Medicine | Admitting: Internal Medicine

## 2014-08-07 DIAGNOSIS — E042 Nontoxic multinodular goiter: Secondary | ICD-10-CM | POA: Insufficient documentation

## 2014-08-07 DIAGNOSIS — E049 Nontoxic goiter, unspecified: Secondary | ICD-10-CM | POA: Diagnosis not present

## 2014-08-07 DIAGNOSIS — E041 Nontoxic single thyroid nodule: Secondary | ICD-10-CM

## 2014-08-07 DIAGNOSIS — E079 Disorder of thyroid, unspecified: Secondary | ICD-10-CM | POA: Diagnosis not present

## 2014-08-07 MED ORDER — LIDOCAINE HCL (PF) 1 % IJ SOLN
INTRAMUSCULAR | Status: AC
  Filled 2014-08-07: qty 10

## 2014-08-07 NOTE — Procedures (Signed)
[place] FNA dominant RIGHT and LEFT nodules No complication No blood loss. See complete dictation in [facility].

## 2014-08-10 ENCOUNTER — Encounter: Payer: Self-pay | Admitting: Internal Medicine

## 2014-08-10 ENCOUNTER — Non-Acute Institutional Stay (SKILLED_NURSING_FACILITY): Payer: Medicare Other | Admitting: Internal Medicine

## 2014-08-10 DIAGNOSIS — I10 Essential (primary) hypertension: Secondary | ICD-10-CM

## 2014-08-10 DIAGNOSIS — F329 Major depressive disorder, single episode, unspecified: Secondary | ICD-10-CM

## 2014-08-10 DIAGNOSIS — E1159 Type 2 diabetes mellitus with other circulatory complications: Secondary | ICD-10-CM | POA: Diagnosis not present

## 2014-08-10 DIAGNOSIS — F0393 Unspecified dementia, unspecified severity, with mood disturbance: Secondary | ICD-10-CM

## 2014-08-10 DIAGNOSIS — F0280 Dementia in other diseases classified elsewhere without behavioral disturbance: Secondary | ICD-10-CM

## 2014-08-10 DIAGNOSIS — I5032 Chronic diastolic (congestive) heart failure: Secondary | ICD-10-CM

## 2014-08-10 DIAGNOSIS — E049 Nontoxic goiter, unspecified: Secondary | ICD-10-CM | POA: Diagnosis not present

## 2014-08-10 DIAGNOSIS — E1151 Type 2 diabetes mellitus with diabetic peripheral angiopathy without gangrene: Secondary | ICD-10-CM

## 2014-08-10 DIAGNOSIS — E785 Hyperlipidemia, unspecified: Secondary | ICD-10-CM

## 2014-08-10 NOTE — Progress Notes (Signed)
Patient ID: Sheila Flynn, female   DOB: 02/12/1946, 68 y.o.   MRN: [id number]    Facility: [facility]  Chief Complaint  Patient presents with  . Medical Management of Chronic Issues   Allergies  Allergen Reactions  . Ace Inhibitors Swelling   HPI 68 y/o female patient seen today for routine visit. She has h/o CAD s/p NSTEMI, chronic diastolic CHF, pulmonary hypertension, COPD,CKD, DMII, hyperlipidemia, CVA s/p dysarthria and vascular dementia and is here for long term care. She denies any complaints. 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 She denies any difficulty swallowing, denies thyroid tenderness  Review of Systems   Constitutional: Negative for fever and chills.   HENT: Negative for congestion.    Eyes: Negative for blurred vision.   Respiratory: Negative for shortness of breath.    Cardiovascular: Negative for chest pain and leg swelling.   Gastrointestinal: Negative for abdominal pain, constipation, blood in stool and melena.   Genitourinary: Negative for dysuria.   Musculoskeletal: Negative for back pain and joint pain.   Skin: Negative for rash.   Neurological: Negative for dizziness.   Endo/Heme/Allergies: Does not bruise/bleed easily.   Psychiatric/Behavioral: has memory loss  Past Medical History  Diagnosis Date  . 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 Chronic diastolic CHF (congestive heart failure)   . CAD (coronary artery disease)     Dr. Matthew Saras  . Angina at rest   . Myocardial infarction   . 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 Hyperlipidemia LDL goal < 100   . Aspiration pneumonia   . COPD (chronic obstructive pulmonary disease)   . Acute respiratory failure 12/21/10  . Pulmonary hypertension   . Chronic constipation   . Rectal cancer     s/p  colostomy  . Thyroid goiter   . Stroke 2011    postop  . 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 Vascular dementia with depressed mood   . Pneumonia 12/21/10  . Delirium, induced by drug     polypharmacy  . Sacral fracture, closed     s/p kyphoplasty  . Fall     history of falls     Medication List       This list is accurate as of: 08/10/14  3:03 PM.  Always use your most recent med list.               acetaminophen 325 MG tablet  Commonly known as:  TYLENOL  Take 650 mg by mouth every 6 (six) hours as needed for mild pain.     amLODipine 10 MG tablet  Commonly known as:  NORVASC  Take 10 mg by mouth daily.     aspirin EC 81 MG EC tablet  Generic drug:  aspirin  Take 81 mg by mouth daily. Swallow whole.     atorvastatin 20 MG tablet  Commonly known as:  LIPITOR  Take 60 mg by mouth at bedtime.     carvedilol 3.125 MG tablet  Commonly known as:  COREG  Take 3.125 mg by mouth 2 (two) times daily with a meal.     cloNIDine 0.1 MG tablet  Commonly known as:  CATAPRES  Take 0.1 mg by mouth 2 (two) times daily.     docusate sodium 100 MG capsule  Commonly known  as:  COLACE  Take 100 mg by mouth 3 (three) times daily.     glipiZIDE 5 MG tablet  Commonly known as:  GLUCOTROL  Take by mouth daily before breakfast.     hydrALAZINE 25 MG tablet  Commonly known as:  APRESOLINE  Take 25 mg by mouth 4 (four) times daily.     ipratropium-albuterol 0.5-2.5 (3) MG/3ML Soln  Commonly known as:  DUONEB  Take 3 mLs by nebulization every 6 (six) hours as needed (shortness of breathe/wheezing).     isosorbide mononitrate 20 MG tablet  Commonly known as:  ISMO,MONOKET  Take 60 mg by mouth daily.     PARoxetine 20 MG tablet  Commonly known as:  PAXIL  Take 30 mg by mouth daily.     polyvinyl alcohol 1.4 % ophthalmic solution  Commonly known as:  LIQUIFILM TEARS  Place 2 drops into both eyes 3 (three) times daily as needed for dry eyes.      ranitidine 150 MG capsule  Commonly known as:  ZANTAC  Take 150 mg by mouth every evening.     sennosides-docusate sodium 8.6-50 MG tablet  Commonly known as:  SENOKOT-S  Take 2 tablets by mouth every evening.     traMADol 50 MG tablet  Commonly known as:  ULTRAM  Take 50 mg by mouth every 8 (eight) hours as needed (pain).       Physical exam BP 134/72  Pulse 82  Temp(Src) 97.1 F (36.2 C)  Resp 16  Ht 5\' 7"  (1.702 m)  Wt 185 lb (83.915 kg)  BMI 28.97 kg/m2  SpO2 95%  Wt Readings from Last 3 Encounters:  08/10/14 185 lb (83.915 kg)  08/07/14 180 lb (81.647 kg)  06/29/14 185 lb (83.915 kg)   Constitutional: She appears well-developed and well-nourished. No distress.   HENT:   Head: Normocephalic and atraumatic.  Right Ear: External ear normal.  Left Ear: External ear normal.   Nose: Nose normal.   Mouth/Throat: Oropharynx is clear and moist.   Eyes: Conjunctivae and EOM are normal. Pupils are equal, round, and reactive to light.   Neck: Neck supple. No JVD present. Non tender, slightly enlarged thyroid Cardiovascular: Normal rate, regular rhythm, normal heart sounds and intact distal pulses.    Pulmonary/Chest: Effort normal. Poor air entry but no expiratory wheeze, crackles or rhonchi noted Abdominal: Soft. Bowel sounds are normal. She exhibits no distension. There is no tenderness. Colostomy site clean Musculoskeletal: Normal range of motion. She exhibits no edema and no tenderness.  Neurological: She is alert. No cranial nerve deficit. She exhibits normal muscle tone.   Skin: Skin is warm and dry.  Psychiatric: normal affect  Imaging and Procedures: 12/21/10:  CT chest/abd/pelvis:  Neg for PE, enlarge central pulmonary artery c/w pulmonary arterial htn, enlarged thyroid gland compressing trachea just below thoracic level causing sngificant tracehal stenosis, nondisplaced bilateral sacral ala fx extending into S1 foramen bilaterally 12/21/10:  CT head/cspine:  Remote  infarct, no acute infarct, cervical spondylosis present, no fx. 08/03/13:  Echo:  Nl to hyperdynamic LV function--EF 54%, LVH, diastolic dysfunction, mild to moderate MR and TR.    08/08/13:  CT chest w/o contrast (abn xray, mediastinal mass?):  Diffusely enlarged thyroid gland likely a thyroid goiter.  Underlying mass cannot be completely excluded.  Small pericardial effusion.  Small nodule right lower thyroid lobe 17mm.  12 month f/u imaging indicated if high risk malignancy.  Labs- 05/16/14 a1c 6.7 07/02/14 tsh 0.487, wbc 5.7, hb  11.7, hct 35, plt 281, na 141, k 3.6, glu 37, bun 23, cr 1.48, lft wnl, alb 4.1, ca 9   Assessment/Plan  Goiter Reviewed ultrasound and biopsy result, review thyroid panel, stable non symptomatic goitre. monitor clinically for now. Recheck thyroid panel in 3 months  DM type II with vascular complications Last B5M 6.7. Recheck a1c. Continue glimepiride 2 mg daily and statin, asa. Check microalbumin urine.   HTN bp stable. Continue amlodipine 10 mg daily, carvedilol 3.125 mg bid and clonidine 0.1 mg bid with hydralazine and imdur. Monitor clinically  Chronic diastolic CHF   Breathing is stable and weight has been stable. Continue b blocker with hydralazine and isosorbide mononitrate.  Depression due to dementia Continue her paroxetine 30 mg daily  Hyperlipidemia LDL goal < 100 Continue statin, check lipid panel  Labs-  a1c, lipid panel next lab with urine microalbumin; thyroid panel in  3 months

## 2014-08-13 ENCOUNTER — Other Ambulatory Visit: Payer: Self-pay | Admitting: Internal Medicine

## 2014-08-13 DIAGNOSIS — E119 Type 2 diabetes mellitus without complications: Secondary | ICD-10-CM | POA: Diagnosis not present

## 2014-08-13 DIAGNOSIS — E785 Hyperlipidemia, unspecified: Secondary | ICD-10-CM | POA: Diagnosis not present

## 2014-08-13 LAB — MICROALBUMIN, URINE: Microalb, Ur: 41.5 mg/dL — ABNORMAL HIGH (ref <2.0)

## 2014-08-13 LAB — LIPID PANEL
Cholesterol: 148 mg/dL (ref 0–200)
HDL: 34 mg/dL — ABNORMAL LOW (ref >39)
LDL Cholesterol: 93 mg/dL (ref 0–99)
Total CHOL/HDL Ratio: 4.4 Ratio
Triglycerides: 104 mg/dL (ref <150)
VLDL: 21 mg/dL (ref 0–40)

## 2014-08-13 LAB — HEMOGLOBIN A1C
Hgb A1c MFr Bld: 7.1 % — ABNORMAL HIGH (ref <5.7)
Mean Plasma Glucose: 157 mg/dL — ABNORMAL HIGH (ref <117)

## 2014-08-17 ENCOUNTER — Non-Acute Institutional Stay (SKILLED_NURSING_FACILITY): Payer: Medicare Other | Admitting: Internal Medicine

## 2014-08-17 DIAGNOSIS — K219 Gastro-esophageal reflux disease without esophagitis: Secondary | ICD-10-CM

## 2014-08-17 DIAGNOSIS — R0789 Other chest pain: Secondary | ICD-10-CM | POA: Diagnosis not present

## 2014-08-17 NOTE — Progress Notes (Signed)
Patient ID: Sheila Flynn, female   DOB: Apr 30, 1946, 68 y.o.   MRN: [id number]    Facility: [facility] Chief Complaint  Patient presents with  . Acute Visit    chest pain   Allergies  Allergen Reactions  . Ace Inhibitors Swelling   HPI 68 y/o female patient seen today for acute visit.  She has been having left sided chest pain on and off for 5 days. The pain lasts for 30 minutes, when worse is 7/10, denies radiation, denies any associated symptoms with it. Denies any aggrevating or relieving factor. She feels tired. Denies heartburn or indigestion. Denies dysphagia Appetite is unchanged She has h/o CAD s/p NSTEMI, chronic diastolic CHF, pulmonary hypertension, COPD,CKD, DMII, hyperlipidemia, CVA s/p dysarthria and vascular dementia.  Review of Systems   Constitutional: Negative for fever and chills.   HENT: Negative for congestion.    Eyes: Negative for blurred vision.   Respiratory: Negative for shortness of breath and URI symptoms Cardiovascular: Negative for palpitations. No chest pain currently.   Gastrointestinal: Negative for abdominal pain, nausea, vomiting, constipation, blood in stool and melena.   Genitourinary: Negative for dysuria.   Musculoskeletal: Negative for back pain and joint pain.   Skin: Negative for rash.   Neurological: Negative for dizziness, headaches  Endo/Heme/Allergies: Does not bruise/bleed easily.   Psychiatric/Behavioral: has memory loss. No anxiety  Past Medical History  Diagnosis Date  . 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 Chronic diastolic CHF (congestive heart failure)   . CAD (coronary artery disease)     Dr. Matthew Saras  . Angina at rest   . Myocardial infarction   . 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 Hyperlipidemia LDL goal < 100   . Aspiration pneumonia   . COPD (chronic obstructive pulmonary disease)   . Acute  respiratory failure 12/21/10  . Pulmonary hypertension   . Chronic constipation   . Rectal cancer     s/p colostomy  . Thyroid goiter   . Stroke 2011    postop  . 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 Vascular dementia with depressed mood   . Pneumonia 12/21/10  . Delirium, induced by drug     polypharmacy  . Sacral fracture, closed     s/p kyphoplasty  . Fall     history of falls   Medication reviewed. See MAR  Exam BP 146/78  Pulse 92  Temp(Src) 98.2 F (36.8 C)  Resp 18  Constitutional: She appears well-developed and well-nourished. No distress.   HENT:   Head: Normocephalic and atraumatic.  Mouth/Throat: Oropharynx is clear and moist.   Neck: Neck supple. No JVD present. Non tender, slightly enlarged thyroid Cardiovascular: Normal rate, regular rhythm, normal heart sounds and intact distal pulses.  Respiratory- equal air entry on both sides, has coarse breath sounds with bilateral rhonchi, no wheezing, no crackles Chest- no chest wall tenderness, no reproducible chest pain Extremities- no leg edema, distal pulses normal Abdominal: Soft. Bowel sounds are normal. She exhibits no distension. There is no tenderness. Colostomy site clean Neurological: She is alert. No cranial nerve deficit.  Skin: Skin is warm and dry.  Psychiatric: normal affect  assessment/plan  Chest pain Has cardiac history but current pain appears non cardiac in origin. On medical management for angina/ CAD. continue carvedilol, imdur and hydralazine. Add NTG prn. No reproducible chest pain making musculoskeletal pain unlikely. Given coarse breath  sounds, concern for pulmonary origin of chest pain. Get cxr to assess for infiltrates or effusion. Concern for ? Reflux. On ranitidine 150 mg daily.Add omeprazole 20 mg daily. Already on statin and aspirin. If pain recurs, consider ekg and serial troponin  gerd Concern for worsening with possible gastritis causing current symptom. Add  omeprazole 20 mg daily to ranitidine and reassess

## 2014-09-07 DIAGNOSIS — M79675 Pain in left toe(s): Secondary | ICD-10-CM | POA: Diagnosis not present

## 2014-09-07 DIAGNOSIS — M79674 Pain in right toe(s): Secondary | ICD-10-CM | POA: Diagnosis not present

## 2014-09-07 DIAGNOSIS — E119 Type 2 diabetes mellitus without complications: Secondary | ICD-10-CM | POA: Diagnosis not present

## 2014-09-07 DIAGNOSIS — B351 Tinea unguium: Secondary | ICD-10-CM | POA: Diagnosis not present

## 2014-09-25 ENCOUNTER — Other Ambulatory Visit: Payer: Self-pay | Admitting: *Deleted

## 2014-09-25 MED ORDER — TRAMADOL HCL 50 MG PO TABS: ORAL_TABLET | ORAL | Status: DC

## 2014-09-25 NOTE — Telephone Encounter (Signed)
[facility] 

## 2014-10-14 DIAGNOSIS — F0280 Dementia in other diseases classified elsewhere without behavioral disturbance: Secondary | ICD-10-CM | POA: Diagnosis not present

## 2014-10-14 DIAGNOSIS — M6281 Muscle weakness (generalized): Secondary | ICD-10-CM | POA: Diagnosis not present

## 2014-10-14 DIAGNOSIS — I5032 Chronic diastolic (congestive) heart failure: Secondary | ICD-10-CM | POA: Diagnosis not present

## 2014-10-14 DIAGNOSIS — I69393 Ataxia following cerebral infarction: Secondary | ICD-10-CM | POA: Diagnosis not present

## 2014-10-14 DIAGNOSIS — N189 Chronic kidney disease, unspecified: Secondary | ICD-10-CM | POA: Diagnosis not present

## 2014-10-14 DIAGNOSIS — F329 Major depressive disorder, single episode, unspecified: Secondary | ICD-10-CM | POA: Diagnosis not present

## 2014-10-14 DIAGNOSIS — R2689 Other abnormalities of gait and mobility: Secondary | ICD-10-CM | POA: Diagnosis not present

## 2014-10-14 DIAGNOSIS — K219 Gastro-esophageal reflux disease without esophagitis: Secondary | ICD-10-CM | POA: Diagnosis not present

## 2014-10-14 DIAGNOSIS — J189 Pneumonia, unspecified organism: Secondary | ICD-10-CM | POA: Diagnosis not present

## 2014-10-14 DIAGNOSIS — J449 Chronic obstructive pulmonary disease, unspecified: Secondary | ICD-10-CM | POA: Diagnosis not present

## 2014-10-14 DIAGNOSIS — I6789 Other cerebrovascular disease: Secondary | ICD-10-CM | POA: Diagnosis not present

## 2014-10-14 DIAGNOSIS — I10 Essential (primary) hypertension: Secondary | ICD-10-CM | POA: Diagnosis not present

## 2014-10-15 DIAGNOSIS — F0280 Dementia in other diseases classified elsewhere without behavioral disturbance: Secondary | ICD-10-CM | POA: Diagnosis not present

## 2014-10-15 DIAGNOSIS — J189 Pneumonia, unspecified organism: Secondary | ICD-10-CM | POA: Diagnosis not present

## 2014-10-15 DIAGNOSIS — I6789 Other cerebrovascular disease: Secondary | ICD-10-CM | POA: Diagnosis not present

## 2014-10-15 DIAGNOSIS — I5032 Chronic diastolic (congestive) heart failure: Secondary | ICD-10-CM | POA: Diagnosis not present

## 2014-10-15 DIAGNOSIS — M6281 Muscle weakness (generalized): Secondary | ICD-10-CM | POA: Diagnosis not present

## 2014-10-15 DIAGNOSIS — J449 Chronic obstructive pulmonary disease, unspecified: Secondary | ICD-10-CM | POA: Diagnosis not present

## 2014-10-16 DIAGNOSIS — M6281 Muscle weakness (generalized): Secondary | ICD-10-CM | POA: Diagnosis not present

## 2014-10-16 DIAGNOSIS — I5032 Chronic diastolic (congestive) heart failure: Secondary | ICD-10-CM | POA: Diagnosis not present

## 2014-10-16 DIAGNOSIS — I6789 Other cerebrovascular disease: Secondary | ICD-10-CM | POA: Diagnosis not present

## 2014-10-16 DIAGNOSIS — F0280 Dementia in other diseases classified elsewhere without behavioral disturbance: Secondary | ICD-10-CM | POA: Diagnosis not present

## 2014-10-16 DIAGNOSIS — J449 Chronic obstructive pulmonary disease, unspecified: Secondary | ICD-10-CM | POA: Diagnosis not present

## 2014-10-16 DIAGNOSIS — J189 Pneumonia, unspecified organism: Secondary | ICD-10-CM | POA: Diagnosis not present

## 2014-10-18 ENCOUNTER — Non-Acute Institutional Stay (SKILLED_NURSING_FACILITY): Payer: Medicare Other | Admitting: Adult Health

## 2014-10-18 DIAGNOSIS — F0280 Dementia in other diseases classified elsewhere without behavioral disturbance: Secondary | ICD-10-CM | POA: Diagnosis not present

## 2014-10-18 DIAGNOSIS — I6789 Other cerebrovascular disease: Secondary | ICD-10-CM | POA: Diagnosis not present

## 2014-10-18 DIAGNOSIS — E1159 Type 2 diabetes mellitus with other circulatory complications: Secondary | ICD-10-CM

## 2014-10-18 DIAGNOSIS — K5909 Other constipation: Secondary | ICD-10-CM

## 2014-10-18 DIAGNOSIS — F0393 Unspecified dementia, unspecified severity, with mood disturbance: Secondary | ICD-10-CM

## 2014-10-18 DIAGNOSIS — K219 Gastro-esophageal reflux disease without esophagitis: Secondary | ICD-10-CM

## 2014-10-18 DIAGNOSIS — I10 Essential (primary) hypertension: Secondary | ICD-10-CM | POA: Diagnosis not present

## 2014-10-18 DIAGNOSIS — F329 Major depressive disorder, single episode, unspecified: Secondary | ICD-10-CM | POA: Diagnosis not present

## 2014-10-18 DIAGNOSIS — E1151 Type 2 diabetes mellitus with diabetic peripheral angiopathy without gangrene: Secondary | ICD-10-CM

## 2014-10-18 DIAGNOSIS — M6281 Muscle weakness (generalized): Secondary | ICD-10-CM | POA: Diagnosis not present

## 2014-10-18 DIAGNOSIS — K5900 Constipation, unspecified: Secondary | ICD-10-CM | POA: Diagnosis not present

## 2014-10-18 DIAGNOSIS — J449 Chronic obstructive pulmonary disease, unspecified: Secondary | ICD-10-CM | POA: Diagnosis not present

## 2014-10-18 DIAGNOSIS — J189 Pneumonia, unspecified organism: Secondary | ICD-10-CM | POA: Diagnosis not present

## 2014-10-18 DIAGNOSIS — I2510 Atherosclerotic heart disease of native coronary artery without angina pectoris: Secondary | ICD-10-CM

## 2014-10-18 DIAGNOSIS — I5032 Chronic diastolic (congestive) heart failure: Secondary | ICD-10-CM | POA: Diagnosis not present

## 2014-10-18 DIAGNOSIS — E785 Hyperlipidemia, unspecified: Secondary | ICD-10-CM

## 2014-10-20 DIAGNOSIS — J189 Pneumonia, unspecified organism: Secondary | ICD-10-CM | POA: Diagnosis not present

## 2014-10-20 DIAGNOSIS — J449 Chronic obstructive pulmonary disease, unspecified: Secondary | ICD-10-CM | POA: Diagnosis not present

## 2014-10-20 DIAGNOSIS — I6789 Other cerebrovascular disease: Secondary | ICD-10-CM | POA: Diagnosis not present

## 2014-10-20 DIAGNOSIS — F0280 Dementia in other diseases classified elsewhere without behavioral disturbance: Secondary | ICD-10-CM | POA: Diagnosis not present

## 2014-10-20 DIAGNOSIS — M6281 Muscle weakness (generalized): Secondary | ICD-10-CM | POA: Diagnosis not present

## 2014-10-20 DIAGNOSIS — I5032 Chronic diastolic (congestive) heart failure: Secondary | ICD-10-CM | POA: Diagnosis not present

## 2014-10-22 DIAGNOSIS — I6789 Other cerebrovascular disease: Secondary | ICD-10-CM | POA: Diagnosis not present

## 2014-10-22 DIAGNOSIS — I5032 Chronic diastolic (congestive) heart failure: Secondary | ICD-10-CM | POA: Diagnosis not present

## 2014-10-22 DIAGNOSIS — J189 Pneumonia, unspecified organism: Secondary | ICD-10-CM | POA: Diagnosis not present

## 2014-10-22 DIAGNOSIS — M6281 Muscle weakness (generalized): Secondary | ICD-10-CM | POA: Diagnosis not present

## 2014-10-22 DIAGNOSIS — J449 Chronic obstructive pulmonary disease, unspecified: Secondary | ICD-10-CM | POA: Diagnosis not present

## 2014-10-22 DIAGNOSIS — F0280 Dementia in other diseases classified elsewhere without behavioral disturbance: Secondary | ICD-10-CM | POA: Diagnosis not present

## 2014-10-23 DIAGNOSIS — J449 Chronic obstructive pulmonary disease, unspecified: Secondary | ICD-10-CM | POA: Diagnosis not present

## 2014-10-23 DIAGNOSIS — I6789 Other cerebrovascular disease: Secondary | ICD-10-CM | POA: Diagnosis not present

## 2014-10-23 DIAGNOSIS — F0280 Dementia in other diseases classified elsewhere without behavioral disturbance: Secondary | ICD-10-CM | POA: Diagnosis not present

## 2014-10-23 DIAGNOSIS — J189 Pneumonia, unspecified organism: Secondary | ICD-10-CM | POA: Diagnosis not present

## 2014-10-23 DIAGNOSIS — M6281 Muscle weakness (generalized): Secondary | ICD-10-CM | POA: Diagnosis not present

## 2014-10-23 DIAGNOSIS — I5032 Chronic diastolic (congestive) heart failure: Secondary | ICD-10-CM | POA: Diagnosis not present

## 2014-10-24 DIAGNOSIS — J449 Chronic obstructive pulmonary disease, unspecified: Secondary | ICD-10-CM | POA: Diagnosis not present

## 2014-10-24 DIAGNOSIS — I6789 Other cerebrovascular disease: Secondary | ICD-10-CM | POA: Diagnosis not present

## 2014-10-24 DIAGNOSIS — I5032 Chronic diastolic (congestive) heart failure: Secondary | ICD-10-CM | POA: Diagnosis not present

## 2014-10-24 DIAGNOSIS — M6281 Muscle weakness (generalized): Secondary | ICD-10-CM | POA: Diagnosis not present

## 2014-10-24 DIAGNOSIS — J189 Pneumonia, unspecified organism: Secondary | ICD-10-CM | POA: Diagnosis not present

## 2014-10-24 DIAGNOSIS — F0280 Dementia in other diseases classified elsewhere without behavioral disturbance: Secondary | ICD-10-CM | POA: Diagnosis not present

## 2014-10-25 DIAGNOSIS — J189 Pneumonia, unspecified organism: Secondary | ICD-10-CM | POA: Diagnosis not present

## 2014-10-25 DIAGNOSIS — M6281 Muscle weakness (generalized): Secondary | ICD-10-CM | POA: Diagnosis not present

## 2014-10-25 DIAGNOSIS — I5032 Chronic diastolic (congestive) heart failure: Secondary | ICD-10-CM | POA: Diagnosis not present

## 2014-10-25 DIAGNOSIS — F0280 Dementia in other diseases classified elsewhere without behavioral disturbance: Secondary | ICD-10-CM | POA: Diagnosis not present

## 2014-10-25 DIAGNOSIS — I6789 Other cerebrovascular disease: Secondary | ICD-10-CM | POA: Diagnosis not present

## 2014-10-25 DIAGNOSIS — J449 Chronic obstructive pulmonary disease, unspecified: Secondary | ICD-10-CM | POA: Diagnosis not present

## 2014-10-26 DIAGNOSIS — I5032 Chronic diastolic (congestive) heart failure: Secondary | ICD-10-CM | POA: Diagnosis not present

## 2014-10-26 DIAGNOSIS — F0280 Dementia in other diseases classified elsewhere without behavioral disturbance: Secondary | ICD-10-CM | POA: Diagnosis not present

## 2014-10-26 DIAGNOSIS — J189 Pneumonia, unspecified organism: Secondary | ICD-10-CM | POA: Diagnosis not present

## 2014-10-26 DIAGNOSIS — J449 Chronic obstructive pulmonary disease, unspecified: Secondary | ICD-10-CM | POA: Diagnosis not present

## 2014-10-26 DIAGNOSIS — M6281 Muscle weakness (generalized): Secondary | ICD-10-CM | POA: Diagnosis not present

## 2014-10-26 DIAGNOSIS — I6789 Other cerebrovascular disease: Secondary | ICD-10-CM | POA: Diagnosis not present

## 2014-10-28 DIAGNOSIS — I5032 Chronic diastolic (congestive) heart failure: Secondary | ICD-10-CM | POA: Diagnosis not present

## 2014-10-28 DIAGNOSIS — J189 Pneumonia, unspecified organism: Secondary | ICD-10-CM | POA: Diagnosis not present

## 2014-10-28 DIAGNOSIS — M6281 Muscle weakness (generalized): Secondary | ICD-10-CM | POA: Diagnosis not present

## 2014-10-28 DIAGNOSIS — J449 Chronic obstructive pulmonary disease, unspecified: Secondary | ICD-10-CM | POA: Diagnosis not present

## 2014-10-28 DIAGNOSIS — F0280 Dementia in other diseases classified elsewhere without behavioral disturbance: Secondary | ICD-10-CM | POA: Diagnosis not present

## 2014-10-28 DIAGNOSIS — I6789 Other cerebrovascular disease: Secondary | ICD-10-CM | POA: Diagnosis not present

## 2014-10-29 DIAGNOSIS — M6281 Muscle weakness (generalized): Secondary | ICD-10-CM | POA: Diagnosis not present

## 2014-10-29 DIAGNOSIS — J189 Pneumonia, unspecified organism: Secondary | ICD-10-CM | POA: Diagnosis not present

## 2014-10-29 DIAGNOSIS — J449 Chronic obstructive pulmonary disease, unspecified: Secondary | ICD-10-CM | POA: Diagnosis not present

## 2014-10-29 DIAGNOSIS — I5032 Chronic diastolic (congestive) heart failure: Secondary | ICD-10-CM | POA: Diagnosis not present

## 2014-10-29 DIAGNOSIS — F0280 Dementia in other diseases classified elsewhere without behavioral disturbance: Secondary | ICD-10-CM | POA: Diagnosis not present

## 2014-10-29 DIAGNOSIS — I6789 Other cerebrovascular disease: Secondary | ICD-10-CM | POA: Diagnosis not present

## 2014-10-30 DIAGNOSIS — J449 Chronic obstructive pulmonary disease, unspecified: Secondary | ICD-10-CM | POA: Diagnosis not present

## 2014-10-30 DIAGNOSIS — M6281 Muscle weakness (generalized): Secondary | ICD-10-CM | POA: Diagnosis not present

## 2014-10-30 DIAGNOSIS — F0280 Dementia in other diseases classified elsewhere without behavioral disturbance: Secondary | ICD-10-CM | POA: Diagnosis not present

## 2014-10-30 DIAGNOSIS — J189 Pneumonia, unspecified organism: Secondary | ICD-10-CM | POA: Diagnosis not present

## 2014-10-30 DIAGNOSIS — I6789 Other cerebrovascular disease: Secondary | ICD-10-CM | POA: Diagnosis not present

## 2014-10-30 DIAGNOSIS — I5032 Chronic diastolic (congestive) heart failure: Secondary | ICD-10-CM | POA: Diagnosis not present

## 2014-10-31 DIAGNOSIS — J449 Chronic obstructive pulmonary disease, unspecified: Secondary | ICD-10-CM | POA: Diagnosis not present

## 2014-10-31 DIAGNOSIS — I6789 Other cerebrovascular disease: Secondary | ICD-10-CM | POA: Diagnosis not present

## 2014-10-31 DIAGNOSIS — J189 Pneumonia, unspecified organism: Secondary | ICD-10-CM | POA: Diagnosis not present

## 2014-10-31 DIAGNOSIS — F0280 Dementia in other diseases classified elsewhere without behavioral disturbance: Secondary | ICD-10-CM | POA: Diagnosis not present

## 2014-10-31 DIAGNOSIS — E119 Type 2 diabetes mellitus without complications: Secondary | ICD-10-CM | POA: Diagnosis not present

## 2014-10-31 DIAGNOSIS — I5032 Chronic diastolic (congestive) heart failure: Secondary | ICD-10-CM | POA: Diagnosis not present

## 2014-10-31 DIAGNOSIS — E559 Vitamin D deficiency, unspecified: Secondary | ICD-10-CM | POA: Diagnosis not present

## 2014-10-31 DIAGNOSIS — M6281 Muscle weakness (generalized): Secondary | ICD-10-CM | POA: Diagnosis not present

## 2014-11-01 DIAGNOSIS — I6789 Other cerebrovascular disease: Secondary | ICD-10-CM | POA: Diagnosis not present

## 2014-11-01 DIAGNOSIS — I5032 Chronic diastolic (congestive) heart failure: Secondary | ICD-10-CM | POA: Diagnosis not present

## 2014-11-01 DIAGNOSIS — F0280 Dementia in other diseases classified elsewhere without behavioral disturbance: Secondary | ICD-10-CM | POA: Diagnosis not present

## 2014-11-01 DIAGNOSIS — J449 Chronic obstructive pulmonary disease, unspecified: Secondary | ICD-10-CM | POA: Diagnosis not present

## 2014-11-01 DIAGNOSIS — M6281 Muscle weakness (generalized): Secondary | ICD-10-CM | POA: Diagnosis not present

## 2014-11-01 DIAGNOSIS — J189 Pneumonia, unspecified organism: Secondary | ICD-10-CM | POA: Diagnosis not present

## 2014-11-04 ENCOUNTER — Encounter: Payer: Self-pay | Admitting: Adult Health

## 2014-11-04 NOTE — Progress Notes (Signed)
Patient ID: Sheila Flynn, female   DOB: 10-Apr-1946, 68 y.o.   MRN: [id number]  [facility]     Allergies  Allergen Reactions  . Ace Inhibitors Swelling       Chief Complaint  Patient presents with  . Medical Management of Chronic Issues    HPI:  She is a long term resident of this facility being seen for the management of her chronic illnesses. Overall there is little in status. She is not voicing any complaints or concerns. There are no nursing concerns being voiced at this time.    Past Medical History  Diagnosis Date  . 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 Chronic diastolic CHF (congestive heart failure)   . CAD (coronary artery disease)     Dr. Matthew Flynn  . Angina at rest   . Myocardial infarction   . 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 Hyperlipidemia LDL goal < 100   . Aspiration pneumonia   . COPD (chronic obstructive pulmonary disease)   . Acute respiratory failure 12/21/10  . Pulmonary hypertension   . Chronic constipation   . Rectal cancer     s/p colostomy  . Thyroid goiter   . Stroke 2011    postop  . 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 Vascular dementia with depressed mood   . Pneumonia 12/21/10  . Delirium, induced by drug     polypharmacy  . Sacral fracture, closed     s/p kyphoplasty  . Fall     history of falls    Past Surgical History  Procedure Laterality Date  . Kyphoplasty      sacrum  . Colostomy      rectal ca    VITAL SIGNS BP 138/80 mmHg  Pulse 90  Ht 5\' 7"  (1.702 m)  Wt 179 lb (81.194 kg)  BMI 28.03 kg/m2  SpO2 94%   Outpatient Encounter Prescriptions as of 10/18/2014  Medication Sig  . acetaminophen (TYLENOL) 325 MG tablet Take 650 mg by mouth every 6 (six) hours as needed for mild pain.  Marland Kitchen amLODipine (NORVASC) 10 MG tablet Take 10 mg by mouth daily.  Marland Kitchen aspirin (ASPIRIN EC) 81 MG EC tablet Take 81  mg by mouth daily. Swallow whole.  Marland Kitchen atorvastatin (LIPITOR) 20 MG tablet Take 20 mg by mouth at bedtime.   . carvedilol (COREG) 3.125 MG tablet Take 3.125 mg by mouth 2 (two) times daily with a meal.  . cloNIDine (CATAPRES) 0.1 MG tablet Take 0.1 mg by mouth 2 (two) times daily.   Marland Kitchen docusate sodium (COLACE) 100 MG capsule Take 100 mg by mouth 3 (three) times daily.  Marland Kitchen glipiZIDE (GLUCOTROL) 5 MG tablet Take by mouth daily before breakfast.  . hydrALAZINE (APRESOLINE) 25 MG tablet Take 25 mg by mouth 4 (four) times daily.   Marland Kitchen ipratropium-albuterol (DUONEB) 0.5-2.5 (3) MG/3ML SOLN Take 3 mLs by nebulization every 6 (six) hours as needed (shortness of breathe/wheezing).  . isosorbide mononitrate (ISMO,MONOKET) 20 MG tablet Take 60 mg by mouth daily.   . nitroGLYCERIN (NITROSTAT) 0.4 MG SL tablet Place 0.4 mg under the tongue every 5 (five) minutes as needed for chest pain.  Marland Kitchen omeprazole (PRILOSEC) 20 MG capsule Take 20 mg by mouth daily.  Marland Kitchen PARoxetine (PAXIL) 20 MG tablet Take 30 mg by mouth daily.   . polyvinyl alcohol (LIQUIFILM TEARS) 1.4 % ophthalmic solution Place 2 drops into  both eyes 3 (three) times daily as needed for dry eyes.  . ranitidine (ZANTAC) 150 MG capsule Take 150 mg by mouth every evening.  . sennosides-docusate sodium (SENOKOT-S) 8.6-50 MG tablet Take 2 tablets by mouth every evening.   . traMADol (ULTRAM) 50 MG tablet Take one tablet by mouth every 8 hours as needed for pain  . Vitamin D, Ergocalciferol, (DRISDOL) 50000 UNITS CAPS capsule Take 50,000 Units by mouth every 30 (thirty) days.     SIGNIFICANT DIAGNOSTIC EXAMS  LABS REVIEWED:   07-02-14: wbc 5.7; hgb 11.7; hct 35.0; mcv 77.3; plt 281; glucose 37; bun 23; creat 1.48; k+3.6 ;na++141; liver normal albumin 4.1; tsh 0.487 08-13-14: chol 148; ldl 93; trig 104; hgb a1c 7.1; urine micro-albumin 41.5     Review of Systems  Constitutional: Negative for malaise/fatigue.  Respiratory: Negative for cough and shortness of  breath.   Cardiovascular: Negative for chest pain, palpitations and leg swelling.  Gastrointestinal: Negative for heartburn, abdominal pain and constipation.  Musculoskeletal: Negative for myalgias and joint pain.  Skin: Negative.   Neurological: Negative for headaches.  Psychiatric/Behavioral: Negative for depression. The patient is not nervous/anxious.      Physical Exam  Constitutional: She appears well-developed and well-nourished. No distress.  Neck: Neck supple. No JVD present. No thyromegaly present.  Cardiovascular: Normal rate, regular rhythm and intact distal pulses.   Respiratory: Effort normal and breath sounds normal. No respiratory distress.  GI: Soft. Bowel sounds are normal. She exhibits no distension. There is no tenderness.  Colostomy   Musculoskeletal:  Is able to move all extremities  No edema   Neurological: She is alert.  Skin: Skin is warm and dry. She is not diaphoretic.       ASSESSMENT/ PLAN:  1. Diabetes: her hgb a1c is 7. 1; is taking glipizide 5 mg daily. Her urine micro-albumin is 41.5.will need a nephrology consult. Is on statin; asa. Will start arb due to ace allergy  2. Hypertension: is not being adequately controlled. Will continue coreg 3.125 mg twice daily; clonidine 0.1 mg twice daily; norvasc 10 mg daily; hydralazine 25 mg four times daily; will begin cozaar 12.5 mg daily will have nursing staff check b/p and pulse twice daily for 2 weeks and report.   3. CAD: is status post MI; no complaints of chest pain present; will continue asa 81 mg daily; imdur 60 mg daily; and ntg prn will monitor  4. Dyslipidemia: will continue lipitor 20 mg daily her ldl is 93  5. Gerd: will continue prilosec 20 mg in the am and zantac 150 mg nightly   6. Constipation: will continue senna 2 tabs nightly and colace three times daily   7. Vit d deficiency: will continue vit d 50,000 units monthly  8. Depression: she is emotionally stable; is receiving benefit  from paxil 30 mg daily   In 2 weeks will check bmp and vit d    Ok Edwards NP [facility]  Contact [phone] Monday through Friday 8am- 5pm  After hours call [phone]

## 2014-11-05 DIAGNOSIS — I10 Essential (primary) hypertension: Secondary | ICD-10-CM | POA: Diagnosis not present

## 2014-11-05 DIAGNOSIS — R2689 Other abnormalities of gait and mobility: Secondary | ICD-10-CM | POA: Diagnosis not present

## 2014-11-05 DIAGNOSIS — M6281 Muscle weakness (generalized): Secondary | ICD-10-CM | POA: Diagnosis not present

## 2014-11-05 DIAGNOSIS — I69393 Ataxia following cerebral infarction: Secondary | ICD-10-CM | POA: Diagnosis not present

## 2014-11-05 DIAGNOSIS — I5032 Chronic diastolic (congestive) heart failure: Secondary | ICD-10-CM | POA: Diagnosis not present

## 2014-11-05 DIAGNOSIS — F329 Major depressive disorder, single episode, unspecified: Secondary | ICD-10-CM | POA: Diagnosis not present

## 2014-11-05 DIAGNOSIS — K219 Gastro-esophageal reflux disease without esophagitis: Secondary | ICD-10-CM | POA: Diagnosis not present

## 2014-11-05 DIAGNOSIS — F0280 Dementia in other diseases classified elsewhere without behavioral disturbance: Secondary | ICD-10-CM | POA: Diagnosis not present

## 2014-11-05 DIAGNOSIS — N189 Chronic kidney disease, unspecified: Secondary | ICD-10-CM | POA: Diagnosis not present

## 2014-11-05 DIAGNOSIS — I6789 Other cerebrovascular disease: Secondary | ICD-10-CM | POA: Diagnosis not present

## 2014-11-05 DIAGNOSIS — J449 Chronic obstructive pulmonary disease, unspecified: Secondary | ICD-10-CM | POA: Diagnosis not present

## 2014-11-05 DIAGNOSIS — J189 Pneumonia, unspecified organism: Secondary | ICD-10-CM | POA: Diagnosis not present

## 2014-11-06 ENCOUNTER — Inpatient Hospital Stay (HOSPITAL_COMMUNITY)
Admission: EM | Admit: 2014-11-06 | Discharge: 2014-11-08 | DRG: 194 | Disposition: A | Discharge status: Skilled Nursing Facility | Payer: Medicare Other | Attending: Internal Medicine | Admitting: Internal Medicine

## 2014-11-06 ENCOUNTER — Encounter (HOSPITAL_COMMUNITY): Payer: Self-pay | Admitting: Emergency Medicine

## 2014-11-06 ENCOUNTER — Emergency Department (HOSPITAL_COMMUNITY): Payer: Medicare Other

## 2014-11-06 DIAGNOSIS — I517 Cardiomegaly: Secondary | ICD-10-CM | POA: Diagnosis not present

## 2014-11-06 DIAGNOSIS — E785 Hyperlipidemia, unspecified: Secondary | ICD-10-CM | POA: Diagnosis present

## 2014-11-06 DIAGNOSIS — Z87891 Personal history of nicotine dependence: Secondary | ICD-10-CM

## 2014-11-06 DIAGNOSIS — I672 Cerebral atherosclerosis: Secondary | ICD-10-CM | POA: Diagnosis present

## 2014-11-06 DIAGNOSIS — E1151 Type 2 diabetes mellitus with diabetic peripheral angiopathy without gangrene: Secondary | ICD-10-CM | POA: Diagnosis present

## 2014-11-06 DIAGNOSIS — F0151 Vascular dementia with behavioral disturbance: Secondary | ICD-10-CM | POA: Diagnosis present

## 2014-11-06 DIAGNOSIS — F0390 Unspecified dementia without behavioral disturbance: Secondary | ICD-10-CM | POA: Diagnosis present

## 2014-11-06 DIAGNOSIS — F0393 Unspecified dementia, unspecified severity, with mood disturbance: Secondary | ICD-10-CM | POA: Diagnosis present

## 2014-11-06 DIAGNOSIS — C20 Malignant neoplasm of rectum: Secondary | ICD-10-CM | POA: Diagnosis present

## 2014-11-06 DIAGNOSIS — E1159 Type 2 diabetes mellitus with other circulatory complications: Secondary | ICD-10-CM

## 2014-11-06 DIAGNOSIS — I5032 Chronic diastolic (congestive) heart failure: Secondary | ICD-10-CM | POA: Diagnosis present

## 2014-11-06 DIAGNOSIS — I2510 Atherosclerotic heart disease of native coronary artery without angina pectoris: Secondary | ICD-10-CM

## 2014-11-06 DIAGNOSIS — F329 Major depressive disorder, single episode, unspecified: Secondary | ICD-10-CM

## 2014-11-06 DIAGNOSIS — R069 Unspecified abnormalities of breathing: Secondary | ICD-10-CM | POA: Diagnosis not present

## 2014-11-06 DIAGNOSIS — I10 Essential (primary) hypertension: Secondary | ICD-10-CM | POA: Diagnosis present

## 2014-11-06 DIAGNOSIS — K219 Gastro-esophageal reflux disease without esophagitis: Secondary | ICD-10-CM | POA: Diagnosis present

## 2014-11-06 DIAGNOSIS — Z933 Colostomy status: Secondary | ICD-10-CM

## 2014-11-06 DIAGNOSIS — E049 Nontoxic goiter, unspecified: Secondary | ICD-10-CM | POA: Diagnosis present

## 2014-11-06 DIAGNOSIS — Z9181 History of falling: Secondary | ICD-10-CM | POA: Diagnosis not present

## 2014-11-06 DIAGNOSIS — R0602 Shortness of breath: Secondary | ICD-10-CM | POA: Diagnosis not present

## 2014-11-06 DIAGNOSIS — F0280 Dementia in other diseases classified elsewhere without behavioral disturbance: Secondary | ICD-10-CM | POA: Diagnosis not present

## 2014-11-06 DIAGNOSIS — Z85048 Personal history of other malignant neoplasm of rectum, rectosigmoid junction, and anus: Secondary | ICD-10-CM

## 2014-11-06 DIAGNOSIS — I130 Hypertensive heart and chronic kidney disease with heart failure and stage 1 through stage 4 chronic kidney disease, or unspecified chronic kidney disease: Secondary | ICD-10-CM | POA: Diagnosis present

## 2014-11-06 DIAGNOSIS — J189 Pneumonia, unspecified organism: Principal | ICD-10-CM | POA: Diagnosis present

## 2014-11-06 DIAGNOSIS — J441 Chronic obstructive pulmonary disease with (acute) exacerbation: Secondary | ICD-10-CM | POA: Diagnosis not present

## 2014-11-06 DIAGNOSIS — I252 Old myocardial infarction: Secondary | ICD-10-CM | POA: Diagnosis not present

## 2014-11-06 DIAGNOSIS — Z8673 Personal history of transient ischemic attack (TIA), and cerebral infarction without residual deficits: Secondary | ICD-10-CM | POA: Diagnosis not present

## 2014-11-06 DIAGNOSIS — N189 Chronic kidney disease, unspecified: Secondary | ICD-10-CM | POA: Diagnosis present

## 2014-11-06 DIAGNOSIS — Y95 Nosocomial condition: Secondary | ICD-10-CM | POA: Diagnosis present

## 2014-11-06 DIAGNOSIS — E1149 Type 2 diabetes mellitus with other diabetic neurological complication: Secondary | ICD-10-CM | POA: Diagnosis present

## 2014-11-06 HISTORY — DX: Dependence on supplemental oxygen: Z99.81

## 2014-11-06 HISTORY — DX: Chronic kidney disease, unspecified: N18.9

## 2014-11-06 HISTORY — DX: Migraine, unspecified, not intractable, without status migrainosus: G43.909

## 2014-11-06 HISTORY — DX: Major depressive disorder, single episode, unspecified: F32.9

## 2014-11-06 HISTORY — DX: Unspecified dementia, unspecified severity, without behavioral disturbance, psychotic disturbance, mood disturbance, and anxiety: F03.90

## 2014-11-06 HISTORY — DX: Gastro-esophageal reflux disease without esophagitis: K21.9

## 2014-11-06 LAB — GLUCOSE, CAPILLARY: Glucose-Capillary: 115 mg/dL — ABNORMAL HIGH (ref 70–99)

## 2014-11-06 LAB — BASIC METABOLIC PANEL
Anion gap: 12 (ref 5–15)
BUN: 15 mg/dL (ref 6–23)
CHLORIDE: 103 meq/L (ref 96–112)
CO2: 27 mmol/L (ref 19–32)
Calcium: 9 mg/dL (ref 8.4–10.5)
Creatinine, Ser: 1.42 mg/dL — ABNORMAL HIGH (ref 0.50–1.10)
GFR calc Af Amer: 43 mL/min — ABNORMAL LOW (ref >90)
GFR calc non Af Amer: 37 mL/min — ABNORMAL LOW (ref >90)
Glucose, Bld: 108 mg/dL — ABNORMAL HIGH (ref 70–99)
Potassium: 3.3 mmol/L — ABNORMAL LOW (ref 3.5–5.1)
Sodium: 142 mmol/L (ref 135–145)

## 2014-11-06 LAB — I-STAT TROPONIN, ED: TROPONIN I, POC: 0.01 ng/mL (ref 0.00–0.08)

## 2014-11-06 LAB — CBC
HCT: 38.5 % (ref 36.0–46.0)
HEMOGLOBIN: 12.1 g/dL (ref 12.0–15.0)
MCH: 25.7 pg — AB (ref 26.0–34.0)
MCHC: 31.4 g/dL (ref 30.0–36.0)
MCV: 81.9 fL (ref 78.0–100.0)
Platelets: 227 10*3/uL (ref 150–400)
RBC: 4.7 MIL/uL (ref 3.87–5.11)
RDW: 16 % — ABNORMAL HIGH (ref 11.5–15.5)
WBC: 7.3 10*3/uL (ref 4.0–10.5)

## 2014-11-06 LAB — TROPONIN I

## 2014-11-06 MED ORDER — ASPIRIN EC 81 MG PO TBEC
81.0000 mg | DELAYED_RELEASE_TABLET | Freq: Every day | ORAL | Status: DC
  Administered 2014-11-06 – 2014-11-08 (×3): 81 mg via ORAL
  Filled 2014-11-06 (×3): qty 1

## 2014-11-06 MED ORDER — DOCUSATE SODIUM 100 MG PO CAPS
100.0000 mg | ORAL_CAPSULE | Freq: Three times a day (TID) | ORAL | Status: DC
  Administered 2014-11-06 – 2014-11-08 (×5): 100 mg via ORAL
  Filled 2014-11-06 (×7): qty 1

## 2014-11-06 MED ORDER — ATORVASTATIN CALCIUM 20 MG PO TABS
20.0000 mg | ORAL_TABLET | Freq: Every day | ORAL | Status: DC
  Administered 2014-11-06 – 2014-11-07 (×2): 20 mg via ORAL
  Filled 2014-11-06 (×5): qty 1

## 2014-11-06 MED ORDER — ACETAMINOPHEN 325 MG PO TABS: 650.0000 mg | ORAL_TABLET | Freq: Four times a day (QID) | ORAL | Status: DC | PRN

## 2014-11-06 MED ORDER — HYDRALAZINE HCL 25 MG PO TABS
25.0000 mg | ORAL_TABLET | Freq: Four times a day (QID) | ORAL | Status: DC
  Administered 2014-11-06 – 2014-11-08 (×6): 25 mg via ORAL
  Filled 2014-11-06 (×9): qty 1

## 2014-11-06 MED ORDER — PREDNISONE 20 MG PO TABS
60.0000 mg | ORAL_TABLET | Freq: Once | ORAL | Status: AC
Start: 2014-11-06 — End: 2014-11-06
  Administered 2014-11-06: 60 mg via ORAL
  Filled 2014-11-06: qty 3

## 2014-11-06 MED ORDER — PAROXETINE HCL 30 MG PO TABS
30.0000 mg | ORAL_TABLET | Freq: Every day | ORAL | Status: DC
  Administered 2014-11-07 – 2014-11-08 (×2): 30 mg via ORAL
  Filled 2014-11-06 (×2): qty 1

## 2014-11-06 MED ORDER — CARVEDILOL 3.125 MG PO TABS
3.1250 mg | ORAL_TABLET | Freq: Two times a day (BID) | ORAL | Status: DC
  Administered 2014-11-06 – 2014-11-08 (×4): 3.125 mg via ORAL
  Filled 2014-11-06 (×6): qty 1

## 2014-11-06 MED ORDER — CLONIDINE HCL 0.1 MG PO TABS
0.1000 mg | ORAL_TABLET | Freq: Two times a day (BID) | ORAL | Status: DC
  Administered 2014-11-06 – 2014-11-08 (×4): 0.1 mg via ORAL
  Filled 2014-11-06 (×5): qty 1

## 2014-11-06 MED ORDER — TRAMADOL HCL 50 MG PO TABS: 50.0000 mg | ORAL_TABLET | Freq: Four times a day (QID) | ORAL | Status: DC | PRN

## 2014-11-06 MED ORDER — POLYVINYL ALCOHOL 1.4 % OP SOLN
2.0000 [drp] | Freq: Three times a day (TID) | OPHTHALMIC | Status: DC | PRN
  Filled 2014-11-06: qty 15

## 2014-11-06 MED ORDER — ISOSORBIDE MONONITRATE 20 MG PO TABS
60.0000 mg | ORAL_TABLET | Freq: Every day | ORAL | Status: DC
  Administered 2014-11-07 – 2014-11-08 (×2): 60 mg via ORAL
  Filled 2014-11-06 (×3): qty 3

## 2014-11-06 MED ORDER — AMLODIPINE BESYLATE 10 MG PO TABS
10.0000 mg | ORAL_TABLET | Freq: Every day | ORAL | Status: DC
  Administered 2014-11-07 – 2014-11-08 (×2): 10 mg via ORAL
  Filled 2014-11-06 (×2): qty 1

## 2014-11-06 MED ORDER — IPRATROPIUM-ALBUTEROL 0.5-2.5 (3) MG/3ML IN SOLN
3.0000 mL | RESPIRATORY_TRACT | Status: DC
  Administered 2014-11-06: 3 mL via RESPIRATORY_TRACT
  Filled 2014-11-06: qty 3

## 2014-11-06 MED ORDER — HEPARIN SODIUM (PORCINE) 5000 UNIT/ML IJ SOLN
5000.0000 [IU] | Freq: Three times a day (TID) | INTRAMUSCULAR | Status: DC
  Administered 2014-11-06 – 2014-11-08 (×5): 5000 [IU] via SUBCUTANEOUS
  Filled 2014-11-06 (×8): qty 1

## 2014-11-06 MED ORDER — PANTOPRAZOLE SODIUM 40 MG PO TBEC
40.0000 mg | DELAYED_RELEASE_TABLET | Freq: Every day | ORAL | Status: DC
  Administered 2014-11-07 – 2014-11-08 (×2): 40 mg via ORAL

## 2014-11-06 MED ORDER — LEVOFLOXACIN IN D5W 750 MG/150ML IV SOLN
750.0000 mg | INTRAVENOUS | Status: DC
  Filled 2014-11-06: qty 150

## 2014-11-06 MED ORDER — NITROGLYCERIN 0.4 MG SL SUBL: 0.4000 mg | SUBLINGUAL_TABLET | SUBLINGUAL | Status: DC | PRN

## 2014-11-06 MED ORDER — ALBUTEROL SULFATE (2.5 MG/3ML) 0.083% IN NEBU
2.5000 mg | INHALATION_SOLUTION | Freq: Once | RESPIRATORY_TRACT | Status: AC
  Administered 2014-11-06: 2.5 mg via RESPIRATORY_TRACT
  Filled 2014-11-06: qty 3

## 2014-11-06 MED ORDER — IPRATROPIUM-ALBUTEROL 0.5-2.5 (3) MG/3ML IN SOLN
3.0000 mL | Freq: Once | RESPIRATORY_TRACT | Status: AC
Start: 2014-11-06 — End: 2014-11-06
  Administered 2014-11-06: 3 mL via RESPIRATORY_TRACT
  Filled 2014-11-06: qty 3

## 2014-11-06 MED ORDER — INSULIN ASPART 100 UNIT/ML ~~LOC~~ SOLN: 0.0000 [IU] | Freq: Every day | SUBCUTANEOUS | Status: DC

## 2014-11-06 MED ORDER — FAMOTIDINE 20 MG PO TABS
20.0000 mg | ORAL_TABLET | Freq: Every day | ORAL | Status: DC
  Administered 2014-11-06 – 2014-11-08 (×3): 20 mg via ORAL
  Filled 2014-11-06 (×3): qty 1

## 2014-11-06 MED ORDER — METHYLPREDNISOLONE SODIUM SUCC 125 MG IJ SOLR
60.0000 mg | Freq: Two times a day (BID) | INTRAMUSCULAR | Status: DC
  Administered 2014-11-07 – 2014-11-08 (×3): 60 mg via INTRAVENOUS
  Filled 2014-11-06 (×4): qty 0.96

## 2014-11-06 MED ORDER — INSULIN ASPART 100 UNIT/ML ~~LOC~~ SOLN
0.0000 [IU] | Freq: Three times a day (TID) | SUBCUTANEOUS | Status: DC
  Administered 2014-11-07: 3 [IU] via SUBCUTANEOUS
  Administered 2014-11-07: 2 [IU] via SUBCUTANEOUS
  Administered 2014-11-07: 5 [IU] via SUBCUTANEOUS

## 2014-11-06 MED ORDER — LEVOFLOXACIN IN D5W 750 MG/150ML IV SOLN
750.0000 mg | INTRAVENOUS | Status: DC
  Administered 2014-11-06: 750 mg via INTRAVENOUS
  Filled 2014-11-06 (×2): qty 150

## 2014-11-06 NOTE — ED Notes (Signed)
Attempted report 

## 2014-11-06 NOTE — H&P (Signed)
[facility] History and Physical  Patient: Sheila Flynn  KPT:[id number]  DOB: Jun 02, 1946  DOS: the patient was seen and examined on 11/06/2014 PCP: Hollace Kinnier, DO  Chief Complaint: shortness of breath  HPI: Sheila Flynn is a 68 y.o. female with Past medical history of hypertension, chronic diastolic heart failure, coronary artery disease, COPD, diabetes mellitus, dyslipidemia, pulmonary hypertension, colostomy, dementia. The patient is a nursing home patient who presented with complaints of shortness of breath that has been ongoing for last few days. Certainly worsened today. She along with that has also cough. She brings up some yellow-green sputum. She denies any fever or chills complains of some nausea and vomiting she felt that she was feeling warm. She denies any chest pain. Denies any abdominal pain. She denies any diarrhea. She denies any burning urination or leg swelling or orthopnea. When EMS found the patient she was having wheezing as well as dried her reportedly and was given duo nebs. In the hospital she was found to have wheezing but no stridor. Patient was apparently started on losartan recently.  The patient is coming from SNF. And at her baseline dependent for most of her ADL.  Review of Systems: as mentioned in the history of present illness.  A Comprehensive review of the other systems is negative.  Past Medical History  Diagnosis Date  . 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 Chronic diastolic CHF (congestive heart failure)   . CAD (coronary artery disease)     Dr. Matthew Saras  . Angina at rest   . Myocardial infarction   . 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 Hyperlipidemia LDL goal < 100   . Aspiration pneumonia   . COPD (chronic obstructive pulmonary disease)   . Acute respiratory failure 12/21/10  . Pulmonary hypertension   . Chronic  constipation   . Rectal cancer     s/p colostomy  . Thyroid goiter   . Stroke 2011    postop  . 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 Vascular dementia with depressed mood   . Pneumonia 12/21/10  . Delirium, induced by drug     polypharmacy  . Sacral fracture, closed     s/p kyphoplasty  . Fall     history of falls  . Dementia   . GERD (gastroesophageal reflux disease)   . Major depressive disorder   . CKD (chronic kidney disease)    Past Surgical History  Procedure Laterality Date  . Kyphoplasty      sacrum  . Colostomy      rectal ca   Social History:  reports that she has quit smoking. Her smoking use included Cigarettes. She smoked 0.00 packs per day. She does not have any smokeless tobacco history on file. She reports that she uses illicit drugs. She reports that she does not drink alcohol.  Allergies  Allergen Reactions  . Ace Inhibitors Swelling    History reviewed. No pertinent family history.  Prior to Admission medications   Medication Sig Start Date End Date Taking? Authorizing Provider  acetaminophen (TYLENOL) 325 MG tablet Take 650 mg by mouth every 6 (six) hours as needed for mild pain.   Yes Historical Provider, MD  amLODipine (NORVASC) 10 MG tablet Take 10 mg by mouth daily.   Yes Historical Provider, MD  aspirin (ASPIRIN EC) 81 MG EC tablet Take 81 mg by mouth daily. Swallow whole.  Yes Historical Provider, MD  atorvastatin (LIPITOR) 20 MG tablet Take 20 mg by mouth at bedtime.    Yes Historical Provider, MD  carvedilol (COREG) 3.125 MG tablet Take 3.125 mg by mouth 2 (two) times daily with a meal.   Yes Historical Provider, MD  cloNIDine (CATAPRES) 0.1 MG tablet Take 0.1 mg by mouth 2 (two) times daily.    Yes Historical Provider, MD  docusate sodium (COLACE) 100 MG capsule Take 100 mg by mouth 3 (three) times daily.   Yes Historical Provider, MD  glipiZIDE (GLUCOTROL) 5 MG tablet Take by mouth daily before breakfast.   Yes Historical  Provider, MD  hydrALAZINE (APRESOLINE) 25 MG tablet Take 25 mg by mouth 4 (four) times daily.    Yes Historical Provider, MD  ipratropium-albuterol (DUONEB) 0.5-2.5 (3) MG/3ML SOLN Take 3 mLs by nebulization every 6 (six) hours as needed (shortness of breathe/wheezing).   Yes Historical Provider, MD  isosorbide mononitrate (ISMO,MONOKET) 20 MG tablet Take 60 mg by mouth daily.    Yes Historical Provider, MD  losartan (COZAAR) 25 MG tablet Take 12.5 mg by mouth daily.   Yes Historical Provider, MD  omeprazole (PRILOSEC) 20 MG capsule Take 20 mg by mouth daily.   Yes Historical Provider, MD  PARoxetine (PAXIL) 20 MG tablet Take 30 mg by mouth daily.    Yes Historical Provider, MD  polyvinyl alcohol (LIQUIFILM TEARS) 1.4 % ophthalmic solution Place 2 drops into both eyes 3 (three) times daily as needed for dry eyes.   Yes Historical Provider, MD  ranitidine (ZANTAC) 150 MG capsule Take 150 mg by mouth every evening.   Yes Historical Provider, MD  sennosides-docusate sodium (SENOKOT-S) 8.6-50 MG tablet Take 2 tablets by mouth every evening.    Yes Historical Provider, MD  Vitamin D, Ergocalciferol, (DRISDOL) 50000 UNITS CAPS capsule Take 50,000 Units by mouth every 30 (thirty) days.   Yes Historical Provider, MD  nitroGLYCERIN (NITROSTAT) 0.4 MG SL tablet Place 0.4 mg under the tongue every 5 (five) minutes as needed for chest pain.    Historical Provider, MD  traMADol Veatrice Bourbon) 50 MG tablet Take one tablet by mouth every 8 hours as needed for pain 09/25/14   Estill Dooms, MD    Physical Exam: Filed Vitals:   11/06/14 1900 11/06/14 1909 11/06/14 1930 11/06/14 2030  BP: 166/74  164/88 158/78  Pulse: 89  93 87  Temp:  97.4 F (36.3 C)    TempSrc:  Core (Comment)    Resp: 18  22 16   SpO2: 100%  100% 100%    General: Alert, Awake and Oriented to Time, Place and Person. Appear in mild distress Eyes: PERRL ENT: Oral Mucosa clear moist. Neck: no JVD Cardiovascular: S1 and S2 Present, no Murmur,  Peripheral Pulses Present Respiratory: Bilateral Air entry equal and Decreased, no Crackles, expiratory bilateral wheezes Abdomen: Bowel Sound present, Soft and non tender Skin: no Rash Extremities: no Pedal edema, no calf tenderness Neurologic: Grossly no focal neuro deficit.  Labs on Admission:  CBC:  Recent Labs Lab 11/06/14 1734  WBC 7.3  HGB 12.1  HCT 38.5  MCV 81.9  PLT 227    CMP     Component Value Date/Time   NA 142 11/06/2014 1734   K 3.3* 11/06/2014 1734   CL 103 11/06/2014 1734   CO2 27 11/06/2014 1734   GLUCOSE 108* 11/06/2014 1734   BUN 15 11/06/2014 1734   CREATININE 1.42* 11/06/2014 1734   CALCIUM 9.0 11/06/2014 1734  PROT 6.3 12/11/2010 0524   ALBUMIN 2.7* 12/11/2010 0524   AST 51* 12/11/2010 0524   ALT 61* 12/11/2010 0524   ALKPHOS 81 12/11/2010 0524   BILITOT 0.7 12/11/2010 0524   GFRNONAA 37* 11/06/2014 1734   GFRAA 43* 11/06/2014 1734    No results for input(s): LIPASE, AMYLASE in the last 168 hours. No results for input(s): AMMONIA in the last 168 hours.   Recent Labs Lab 11/06/14 1734  TROPONINI <0.03   BNP (last 3 results) No results for input(s): PROBNP in the last 8760 hours.  Radiological Exams on Admission: Dg Chest Portable 1 View  11/06/2014   CLINICAL DATA:  Shortness of breath, onset earlier today.  EXAM: PORTABLE CHEST - 1 VIEW  COMPARISON:  Radiograph 12/12/2010, CT 12/09/2010  FINDINGS: Lung volumes are low. Stable cardiomegaly. There is probable retrocardiac opacity. Thickening of the paratracheal stripes with mass effect on the trachea consistent with substernal extension of goiter, as seen on previous CT. No large pleural effusion or pneumothorax.  IMPRESSION: Probable left lower lobe retrocardiac opacity, atelectasis versus pneumonia. Grossly stable cardiomegaly allowing for differences in technique.   Electronically Signed   By: Jeb Levering M.D.   On: 11/06/2014 18:25    EKG: Independently reviewed. normal sinus  rhythm, nonspecific ST and T waves changes.  Assessment/Plan Principal Problem:   HCAP (healthcare-associated pneumonia) Active Problems:   ADENOCARCINOMA, RECTUM   Essential hypertension   Chronic diastolic CHF (congestive heart failure)   CAD (coronary artery disease)   DM (diabetes mellitus) type II controlled peripheral vascular disorder   Depression due to dementia   COPD exacerbation   1. HCAP (healthcare-associated pneumonia) The patient is presenting with complaints of cough and shortness of breath. Her chest x-ray has been read as possible pneumonia. She does not have any significant crackles does not have any leukocytosis. She was hypoxic initially but at present has improved significantly. With this the patient will be admitted in the hospital. I will continue monitoring her on telemetry treating her with levofloxacin, Solu-Medrol and DuoNeb's. Oxygen as needed. Follow cultures.  2. Chronic diastolic heart failure. Continue home medications.  3. Essential hypertension. Next 1 holding losartan. Continuing other medications.  4. Dementia. Continue close monitoring.  Advance goals of care discussion: Full code   DVT Prophylaxis: subcutaneous Heparin Nutrition: Cardiac diet  Disposition: Admitted to inpatient in telemetry nit.  Author: Berle Mull, MD [facility] Pager: [phone] 11/06/2014, 9:02 PM    If 7PM-7AM, please contact night-coverage [email or web address] Password [email or web address]

## 2014-11-06 NOTE — ED Provider Notes (Signed)
CSN: [id number]     Arrival date & time 11/06/14  1719 History   First MD Initiated Contact with Patient 11/06/14 1743     Chief Complaint  Patient presents with  . Shortness of Breath     (Consider location/radiation/quality/duration/timing/severity/associated sxs/prior Treatment) Patient is a 68 y.o. female presenting with shortness of breath. The history is provided by the patient and the EMS personnel.  Shortness of Breath Severity:  Moderate Onset quality:  Sudden Duration:  4 hours Timing:  Constant Progression:  Improving (after EMS duonebs) Chronicity:  New Context comment:  At rest. has hx of COPD Relieved by:  Inhaler Worsened by:  Nothing tried Ineffective treatments:  None tried Associated symptoms: wheezing   Associated symptoms: no abdominal pain, no chest pain, no cough, no diaphoresis, no fever, no headaches, no rash, no sore throat and no vomiting     Past Medical History  Diagnosis Date  . 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 Chronic diastolic CHF (congestive heart failure)   . CAD (coronary artery disease)     Dr. Matthew Saras  . Angina at rest   . Myocardial infarction   . 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 Hyperlipidemia LDL goal < 100   . Aspiration pneumonia   . COPD (chronic obstructive pulmonary disease)   . Acute respiratory failure 12/21/10  . Pulmonary hypertension   . Chronic constipation   . Rectal cancer     s/p colostomy  . Thyroid goiter   . Stroke 2011    postop  . 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 Vascular dementia with depressed mood   . Pneumonia 12/21/10  . Delirium, induced by drug     polypharmacy  . Sacral fracture, closed     s/p kyphoplasty  . Fall     history of falls  . Dementia   . GERD (gastroesophageal reflux disease)   . Major depressive disorder   . CKD (chronic kidney disease)    Past Surgical  History  Procedure Laterality Date  . Kyphoplasty      sacrum  . Colostomy      rectal ca   History reviewed. No pertinent family history. History  Substance Use Topics  . Smoking status: Former Smoker    Types: Cigarettes  . Smokeless tobacco: Not on file  . Alcohol Use: No   OB History    No data available     Review of Systems  Constitutional: Negative for fever, chills, diaphoresis, activity change, appetite change and fatigue.  HENT: Negative for facial swelling, rhinorrhea, sore throat, trouble swallowing and voice change.   Eyes: Negative for photophobia, pain and visual disturbance.  Respiratory: Positive for shortness of breath, wheezing and stridor. Negative for cough.   Cardiovascular: Negative for chest pain, palpitations and leg swelling.  Gastrointestinal: Negative for nausea, vomiting, abdominal pain, constipation and anal bleeding.  Endocrine: Negative.   Genitourinary: Negative for dysuria, vaginal bleeding, vaginal discharge and vaginal pain.  Musculoskeletal: Negative for myalgias, back pain and arthralgias.  Skin: Negative.  Negative for rash.  Allergic/Immunologic: Negative.   Neurological: Negative for dizziness, tremors, syncope, weakness and headaches.  Psychiatric/Behavioral: Negative for suicidal ideas, sleep disturbance and self-injury.  All other systems reviewed and are negative.     Allergies  Ace inhibitors  Home Medications   Prior to Admission medications   Medication Sig Start Date End Date Taking? Authorizing  Provider  acetaminophen (TYLENOL) 325 MG tablet Take 650 mg by mouth every 6 (six) hours as needed for mild pain.   Yes Historical Provider, MD  amLODipine (NORVASC) 10 MG tablet Take 10 mg by mouth daily.   Yes Historical Provider, MD  aspirin (ASPIRIN EC) 81 MG EC tablet Take 81 mg by mouth daily. Swallow whole.   Yes Historical Provider, MD  atorvastatin (LIPITOR) 20 MG tablet Take 20 mg by mouth at bedtime.    Yes Historical  Provider, MD  carvedilol (COREG) 3.125 MG tablet Take 3.125 mg by mouth 2 (two) times daily with a meal.   Yes Historical Provider, MD  cloNIDine (CATAPRES) 0.1 MG tablet Take 0.1 mg by mouth 2 (two) times daily.    Yes Historical Provider, MD  docusate sodium (COLACE) 100 MG capsule Take 100 mg by mouth 3 (three) times daily.   Yes Historical Provider, MD  glipiZIDE (GLUCOTROL) 5 MG tablet Take by mouth daily before breakfast.   Yes Historical Provider, MD  hydrALAZINE (APRESOLINE) 25 MG tablet Take 25 mg by mouth 4 (four) times daily.    Yes Historical Provider, MD  ipratropium-albuterol (DUONEB) 0.5-2.5 (3) MG/3ML SOLN Take 3 mLs by nebulization every 6 (six) hours as needed (shortness of breathe/wheezing).   Yes Historical Provider, MD  isosorbide mononitrate (ISMO,MONOKET) 20 MG tablet Take 60 mg by mouth daily.    Yes Historical Provider, MD  losartan (COZAAR) 25 MG tablet Take 12.5 mg by mouth daily.   Yes Historical Provider, MD  omeprazole (PRILOSEC) 20 MG capsule Take 20 mg by mouth daily.   Yes Historical Provider, MD  PARoxetine (PAXIL) 20 MG tablet Take 30 mg by mouth daily.    Yes Historical Provider, MD  polyvinyl alcohol (LIQUIFILM TEARS) 1.4 % ophthalmic solution Place 2 drops into both eyes 3 (three) times daily as needed for dry eyes.   Yes Historical Provider, MD  ranitidine (ZANTAC) 150 MG capsule Take 150 mg by mouth every evening.   Yes Historical Provider, MD  sennosides-docusate sodium (SENOKOT-S) 8.6-50 MG tablet Take 2 tablets by mouth every evening.    Yes Historical Provider, MD  Vitamin D, Ergocalciferol, (DRISDOL) 50000 UNITS CAPS capsule Take 50,000 Units by mouth every 30 (thirty) days.   Yes Historical Provider, MD  nitroGLYCERIN (NITROSTAT) 0.4 MG SL tablet Place 0.4 mg under the tongue every 5 (five) minutes as needed for chest pain.    Historical Provider, MD  traMADol (ULTRAM) 50 MG tablet Take one tablet by mouth every 8 hours as needed for pain 09/25/14    Estill Dooms, MD   BP 164/88 mmHg  Pulse 93  Temp(Src) 97.4 F (36.3 C) (Core (Comment))  Resp 22  SpO2 100% Physical Exam  Constitutional: She is oriented to person, place, and time. She appears well-developed and well-nourished. No distress.  HENT:  Head: Normocephalic and atraumatic.  Right Ear: External ear normal.  Left Ear: External ear normal.  Mouth/Throat: Oropharynx is clear and moist. No oropharyngeal exudate.  Eyes: Conjunctivae and EOM are normal. Pupils are equal, round, and reactive to light. No scleral icterus.  Neck: Normal range of motion. Neck supple. No JVD present. No tracheal deviation present. No thyromegaly present.  Cardiovascular: Normal rate, regular rhythm and intact distal pulses.  Exam reveals no gallop and no friction rub.   No murmur heard. Pulmonary/Chest: Effort normal. No respiratory distress. She has wheezes (wheezing in bilateral apical lung fields). She has no rales.  Abdominal: Soft. Bowel sounds  are normal. She exhibits no distension. There is no tenderness.  Musculoskeletal: Normal range of motion. She exhibits no edema or tenderness.  Neurological: She is alert and oriented to person, place, and time. No cranial nerve deficit. She exhibits normal muscle tone. Coordination normal.  Skin: Skin is warm and dry. She is not diaphoretic. No pallor.  Psychiatric: She has a normal mood and affect. She expresses no homicidal and no suicidal ideation. She expresses no suicidal plans and no homicidal plans.  Nursing note and vitals reviewed.   ED Course  Procedures (including critical care time) Procedure note: Ultrasound Guided Peripheral IV Ultrasound guided 20 g peripheral 1.88 inch angiocath IV placement performed by me. Indications: Nursing unable to place IV. Details: The right antecubital fossa and upper arm was evaluated with a multifrequency linear probe. Several patent brachial veins are noted. 1 attempts were made to cannulate a right vein  under realtime US guidance with successful cannulation of the vein and catheter placement. There is return of non-pulsatile dark red blood. The patient tolerated the procedure well without complications. An ultrasound image is archived.  Labs Review Labs Reviewed  BASIC METABOLIC PANEL - Abnormal; Notable for the following:    Potassium 3.3 (*)    Glucose, Bld 108 (*)    Creatinine, Ser 1.42 (*)    GFR calc non Af Amer 37 (*)    GFR calc Af Amer 43 (*)    All other components within normal limits  CBC - Abnormal; Notable for the following:    MCH 25.7 (*)    RDW 16.0 (*)    All other components within normal limits  TROPONIN I  I-STAT TROPOININ, ED    Imaging Review Dg Chest Portable 1 View  11/06/2014   CLINICAL DATA:  Shortness of breath, onset earlier today.  EXAM: PORTABLE CHEST - 1 VIEW  COMPARISON:  Radiograph 12/12/2010, CT 12/09/2010  FINDINGS: Lung volumes are low. Stable cardiomegaly. There is probable retrocardiac opacity. Thickening of the paratracheal stripes with mass effect on the trachea consistent with substernal extension of goiter, as seen on previous CT. No large pleural effusion or pneumothorax.  IMPRESSION: Probable left lower lobe retrocardiac opacity, atelectasis versus pneumonia. Grossly stable cardiomegaly allowing for differences in technique.   Electronically Signed   By: Jeb Levering M.D.   On: 11/06/2014 18:25     EKG Interpretation   Date/Time:  Tuesday November 06 2014 17:27:44 EST Ventricular Rate:  84 PR Interval:  171 QRS Duration: 156 QT Interval:  430 QTC Calculation: 508 R Axis:   81 Text Interpretation:  Sinus rhythm IVCD, consider atypical LBBB artifact  noted Abnormal ekg Confirmed by Christy Gentles  MD, Elenore Rota ([id number]) on 11/06/2014  5:50:39 PM      MDM   Final diagnoses:  Shortness of breath    Pt is a 68 y.o. F who presents with SOB and was noted to have wheezing and stridor by EMS that improved with duonebs. No fever or cough.  Patient given steroids and further breathing tx in ED and admitted to hospitalist service for further management of SOB.   Patient seen with attending, Dr. Christy Gentles, who oversaw clinical decision making.     Margaretann Loveless, MD 11/06/14 2014  Sharyon Cable, MD 11/07/14 [phone]

## 2014-11-06 NOTE — ED Notes (Signed)
Pt from [facility] via [place] with c/o sudden onset shortness breath and diaphoresis.  Given a deuoneb prior to arrival, stridor lung sounds, given a second deuoneb by EMS, non-productive weak cough, no hx of the same.  Pt in NAD, A&O.

## 2014-11-06 NOTE — ED Provider Notes (Signed)
Patient seen/examined in the Emergency Department in conjunction with Resident Physician Provider Rhina Brackett Patient reports shortness of breath Exam : she is awake/alert, lung sounds have improved after treatment Plan: admit for likely COPD exacerbation   Sheila Cable, MD 11/06/14 1925

## 2014-11-07 LAB — INFLUENZA PANEL BY PCR (TYPE A & B)
H1N1FLUPCR: NOT DETECTED
INFLBPCR: NEGATIVE
Influenza A By PCR: NEGATIVE

## 2014-11-07 LAB — GLUCOSE, CAPILLARY
GLUCOSE-CAPILLARY: 208 mg/dL — AB (ref 70–99)
Glucose-Capillary: 164 mg/dL — ABNORMAL HIGH (ref 70–99)
Glucose-Capillary: 138 mg/dL — ABNORMAL HIGH (ref 70–99)
Glucose-Capillary: 156 mg/dL — ABNORMAL HIGH (ref 70–99)

## 2014-11-07 LAB — CBC WITH DIFFERENTIAL/PLATELET
Basophils Absolute: 0 10*3/uL (ref 0.0–0.1)
Basophils Relative: 0 % (ref 0–1)
EOS ABS: 0 10*3/uL (ref 0.0–0.7)
EOS PCT: 0 % (ref 0–5)
HCT: 35.8 % — ABNORMAL LOW (ref 36.0–46.0)
Hemoglobin: 11.5 g/dL — ABNORMAL LOW (ref 12.0–15.0)
Lymphocytes Relative: 8 % — ABNORMAL LOW (ref 12–46)
Lymphs Abs: 0.6 10*3/uL — ABNORMAL LOW (ref 0.7–4.0)
MCH: 26.1 pg (ref 26.0–34.0)
MCHC: 32.1 g/dL (ref 30.0–36.0)
MCV: 81.4 fL (ref 78.0–100.0)
Monocytes Absolute: 0.1 10*3/uL (ref 0.1–1.0)
Monocytes Relative: 2 % — ABNORMAL LOW (ref 3–12)
Neutro Abs: 6.4 10*3/uL (ref 1.7–7.7)
Neutrophils Relative %: 90 % — ABNORMAL HIGH (ref 43–77)
PLATELETS: 237 10*3/uL (ref 150–400)
RBC: 4.4 MIL/uL (ref 3.87–5.11)
RDW: 16.1 % — ABNORMAL HIGH (ref 11.5–15.5)
WBC: 7 10*3/uL (ref 4.0–10.5)

## 2014-11-07 LAB — COMPREHENSIVE METABOLIC PANEL
ALT: 16 U/L (ref 0–35)
AST: 19 U/L (ref 0–37)
Albumin: 3.5 g/dL (ref 3.5–5.2)
Alkaline Phosphatase: 73 U/L (ref 39–117)
Anion gap: 10 (ref 5–15)
BUN: 16 mg/dL (ref 6–23)
CO2: 26 mmol/L (ref 19–32)
Calcium: 9 mg/dL (ref 8.4–10.5)
Chloride: 104 mEq/L (ref 96–112)
Creatinine, Ser: 1.32 mg/dL — ABNORMAL HIGH (ref 0.50–1.10)
GFR calc non Af Amer: 41 mL/min — ABNORMAL LOW (ref >90)
GFR, EST AFRICAN AMERICAN: 47 mL/min — AB (ref >90)
Glucose, Bld: 145 mg/dL — ABNORMAL HIGH (ref 70–99)
Potassium: 3.8 mmol/L (ref 3.5–5.1)
SODIUM: 140 mmol/L (ref 135–145)
TOTAL PROTEIN: 7.3 g/dL (ref 6.0–8.3)
Total Bilirubin: 0.4 mg/dL (ref 0.3–1.2)

## 2014-11-07 LAB — LEGIONELLA ANTIGEN, URINE

## 2014-11-07 LAB — STREP PNEUMONIAE URINARY ANTIGEN: STREP PNEUMO URINARY ANTIGEN: NEGATIVE

## 2014-11-07 MED ORDER — ALBUTEROL SULFATE (2.5 MG/3ML) 0.083% IN NEBU: 2.5000 mg | INHALATION_SOLUTION | RESPIRATORY_TRACT | Status: DC | PRN

## 2014-11-07 MED ORDER — IPRATROPIUM-ALBUTEROL 0.5-2.5 (3) MG/3ML IN SOLN
3.0000 mL | Freq: Three times a day (TID) | RESPIRATORY_TRACT | Status: DC
  Administered 2014-11-07 – 2014-11-08 (×5): 3 mL via RESPIRATORY_TRACT
  Filled 2014-11-07 (×5): qty 3

## 2014-11-07 NOTE — Progress Notes (Signed)
PROGRESS NOTE  Sheila Flynn SFK:[id number] DOB: 04/01/1946 DOA: 11/06/2014 PCP: Hollace Kinnier, DO  HPI: Sheila Flynn is a 69 y.o. female with Past medical history of hypertension, chronic diastolic heart failure, coronary artery disease, COPD, diabetes mellitus, dyslipidemia, pulmonary hypertension, colostomy, dementia. The patient is a nursing home patient who presented with complaints of shortness of breath that has been ongoing for last few days.  Subjective / 24 H Interval events - states that her breathing is much better, she feels back to normal this morning  Assessment/Plan: Principal Problem:   HCAP (healthcare-associated pneumonia) Active Problems:   ADENOCARCINOMA, RECTUM   Essential hypertension   Chronic diastolic CHF (congestive heart failure)   CAD (coronary artery disease)   DM (diabetes mellitus) type II controlled peripheral vascular disorder   Depression due to dementia   COPD exacerbation    HCAP - CXR with likely retrocardiac opacity - responding to Levofloxacin, continue - continue steroids, no wheezing this morning - Duonebs TID  HTN - continue home medications  GERD - continue PPI  HLD - continue Lipitor  Chronic diastolic heart failure  - continue home medications   Dementia - continue monitoring    Diet: Diet heart healthy/carb modified Fluids: none  DVT Prophylaxis: heparin  Code Status: Full Code Family Communication: none bedside  Disposition Plan: SNF when ready   Consultants:  None   Procedures:  None    Antibiotics  Anti-infectives    Start     Dose/Rate Route Frequency Ordered Stop   11/06/14 2200  levofloxacin (LEVAQUIN) IVPB 750 mg  Status:  Discontinued     750 mg100 mL/hr over 90 Minutes Intravenous Every 24 hours 11/06/14 2101 11/06/14 2130   11/06/14 2130  levofloxacin (LEVAQUIN) IVPB 750 mg     750 mg100 mL/hr over 90 Minutes Intravenous Every 48 hours 11/06/14 2130 11/12/14 2144        Studies  Dg Chest Portable 1 View  11/06/2014   CLINICAL DATA:  Shortness of breath, onset earlier today.  EXAM: PORTABLE CHEST - 1 VIEW  COMPARISON:  Radiograph 12/12/2010, CT 12/09/2010  FINDINGS: Lung volumes are low. Stable cardiomegaly. There is probable retrocardiac opacity. Thickening of the paratracheal stripes with mass effect on the trachea consistent with substernal extension of goiter, as seen on previous CT. No large pleural effusion or pneumothorax.  IMPRESSION: Probable left lower lobe retrocardiac opacity, atelectasis versus pneumonia. Grossly stable cardiomegaly allowing for differences in technique.   Electronically Signed   By: Jeb Levering M.D.   On: 11/06/2014 18:25     Objective  Filed Vitals:   11/07/14 1100 11/07/14 1325 11/07/14 1357 11/07/14 1403  BP:  138/76    Pulse:  99    Temp:  99.9 F (37.7 C)    TempSrc:  Oral    Resp:  18    Height:      Weight:      SpO2: 96% 94% 92% 92%    Intake/Output Summary (Last 24 hours) at 11/07/14 1619 Last data filed at 11/07/14 1418  Gross per 24 hour  Intake    630 ml  Output    500 ml  Net    130 ml   Filed Weights   11/06/14 2102 11/07/14 0524  Weight: 78.79 kg (173 lb 11.2 oz) 79.425 kg (175 lb 1.6 oz)    Exam:  General:  NAD  HEENT: no scleral icterus  Cardiovascular: RRR  Respiratory: CTA biL, no wheezing  Abdomen: soft, non  tender  Skin: no rashes  Neuro: non focal   Data Reviewed: Basic Metabolic Panel:  Recent Labs Lab 11/06/14 1734 11/07/14 0820  NA 142 140  K 3.3* 3.8  CL 103 104  CO2 27 26  GLUCOSE 108* 145*  BUN 15 16  CREATININE 1.42* 1.32*  CALCIUM 9.0 9.0   Liver Function Tests:  Recent Labs Lab 11/07/14 0820  AST 19  ALT 16  ALKPHOS 73  BILITOT 0.4  PROT 7.3  ALBUMIN 3.5   No results for input(s): LIPASE, AMYLASE in the last 168 hours. No results for input(s): AMMONIA in the last 168 hours. CBC:  Recent Labs Lab 11/06/14 1734 11/07/14 0820   WBC 7.3 7.0  NEUTROABS  --  6.4  HGB 12.1 11.5*  HCT 38.5 35.8*  MCV 81.9 81.4  PLT 227 237   Cardiac Enzymes:  Recent Labs Lab 11/06/14 1734  TROPONINI <0.03   BNP (last 3 results) No results for input(s): PROBNP in the last 8760 hours. CBG:  Recent Labs Lab 11/06/14 2153 11/07/14 0746 11/07/14 1130  GLUCAP 115* 164* 138*    No results found for this or any previous visit (from the past 240 hour(s)).   Scheduled Meds: . amLODipine  10 mg Oral Daily  . aspirin EC  81 mg Oral Daily  . atorvastatin  20 mg Oral q1800  . carvedilol  3.125 mg Oral BID WC  . cloNIDine  0.1 mg Oral BID  . docusate sodium  100 mg Oral TID  . famotidine  20 mg Oral Daily  . heparin  5,000 Units Subcutaneous 3 times per day  . hydrALAZINE  25 mg Oral QID  . insulin aspart  0-15 Units Subcutaneous TID WC  . insulin aspart  0-5 Units Subcutaneous QHS  . ipratropium-albuterol  3 mL Nebulization TID  . isosorbide mononitrate  60 mg Oral Daily  . levofloxacin (LEVAQUIN) IV  750 mg Intravenous Q48H  . methylPREDNISolone (SOLU-MEDROL) injection  60 mg Intravenous Q12H  . pantoprazole  40 mg Oral QAC breakfast  . PARoxetine  30 mg Oral Daily   Continuous Infusions:   Marzetta Board, MD [facility] Pager [phone]. If 7 PM - 7 AM, please contact night-coverage at [email or web address], password [facility] 11/07/2014, 4:19 PM  LOS: 1 day

## 2014-11-07 NOTE — Clinical Social Work Psychosocial (Signed)
Clinical Social Work Department BRIEF PSYCHOSOCIAL ASSESSMENT 11/07/2014  Patient:  Sheila Flynn, Sheila Flynn     Account Number:  [id number]     Admit date:  11/06/2014  Clinical Social Worker:  Lovey Newcomer  Date/Time:  11/07/2014 11:18 AM  Referred by:  Physician  Date Referred:  11/07/2014 Referred for  SNF Placement   Other Referral:   NA   Interview type:  Patient Other interview type:   Patient alert and oriented at time of assessment.    PSYCHOSOCIAL DATA Living Status:  FACILITY Admitted from facility:  [place] Level of care:  [place] Primary support name:  Helene Kelp Primary support relationship to patient:  CHILD, ADULT Degree of support available:   Support is good.    CURRENT CONCERNS Current Concerns  Post-Acute Placement   Other Concerns:   NA    SOCIAL WORK ASSESSMENT / PLAN CSW met with patient at bedside to complete assessment. Patient confirms that she was admitted from [facility] where she is a long term resident. The patient states that she plans to return to the facility at discharge and is happy with the care she receives at the facility. Patient's "friend" was at bedside and encouraged patient to ask to speak with the representative from [facility]. CSW asked if patient wanted to do this and she stated that she did. CSW contact Gayla Medicus who is meeting with the patient to discuss any potential concerns. Patient exhibited flat affect and minimal eye contact during assessment. Patient appeared calm and but minimally engaged in assessment.   Assessment/plan status:  Psychosocial Support/Ongoing Assessment of Needs Other assessment/ plan:   Complete Fl2, Fax, PASRR   Information/referral to community resources:   CSW contact information given to patient.    PATIENT'S/FAMILY'S RESPONSE TO PLAN OF CARE: Patient states that she plans to return to [facility] at discharge  when medically stable. CSW will assist with DC.       Liz Beach MSW, [place], [place], [phone]

## 2014-11-08 LAB — GLUCOSE, CAPILLARY
GLUCOSE-CAPILLARY: 200 mg/dL — AB (ref 70–99)
Glucose-Capillary: 154 mg/dL — ABNORMAL HIGH (ref 70–99)
Glucose-Capillary: 171 mg/dL — ABNORMAL HIGH (ref 70–99)

## 2014-11-08 LAB — EXPECTORATED SPUTUM ASSESSMENT W REFEX TO RESP CULTURE

## 2014-11-08 LAB — EXPECTORATED SPUTUM ASSESSMENT W GRAM STAIN, RFLX TO RESP C

## 2014-11-08 MED ORDER — LEVOFLOXACIN 750 MG PO TABS: 750.0000 mg | ORAL_TABLET | ORAL | Status: DC

## 2014-11-08 MED ORDER — TRAMADOL HCL 50 MG PO TABS: ORAL_TABLET | ORAL | Status: DC

## 2014-11-08 MED ORDER — GUAIFENESIN ER 600 MG PO TB12: 600.0000 mg | ORAL_TABLET | Freq: Two times a day (BID) | ORAL | Status: DC

## 2014-11-08 MED ORDER — PREDNISONE 20 MG PO TABS: 20.0000 mg | ORAL_TABLET | Freq: Every day | ORAL | Status: DC

## 2014-11-08 NOTE — Progress Notes (Signed)
PT Cancellation Note  Patient Details Name: Sheila Flynn MRN: [id number] DOB: [birth year]   Cancelled Treatment:    Reason Eval/Treat Not Completed: PT screened, no needs identified, will sign off   Patient is a long-term resident at the SNF and does not require a PT evaluation to return to her SNF. She should be screened by the PT at her facility on return and if she has had a decline in function, they will pursue an order for PT evaluation (at the SNF).   Satoru Milich 11/08/2014, 4:09 PM  Pager [phone]

## 2014-11-08 NOTE — Clinical Social Work Note (Signed)
Per MD patient ready to DC back to [facility]. RN, patient/family (daughter Helene Kelp), and facility notified of patient's DC. RN given number for report. DC packet on patient's chart. Ambulance transport requested for patient for 5:00PM (Services request ID: [id number]). PT was ordered (imminent discharge) to see patient for evaluation, but unsure if this will happen today. If patient is not evaluated by PT before she discharges (transport set up for 5:00PM), she will have to be evaluated at SNF.CSW signing off at this time.

## 2014-11-08 NOTE — Progress Notes (Signed)
UR COMPLETED  

## 2014-11-08 NOTE — Evaluation (Signed)
Clinical/Bedside Swallow Evaluation Patient Details  Name: Sheila Flynn MRN: [id number] Date of Birth: Apr 10, 1946  Today's Date: 11/08/2014 Time: 1030-1050 SLP Time Calculation (min) (ACUTE ONLY): 20 min  Past Medical History:  Past Medical History  Diagnosis Date  . 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 Chronic diastolic CHF (congestive heart failure)   . CAD (coronary artery disease)     Dr. Matthew Saras  . Angina at rest   . Hyperlipidemia LDL goal < 100   . Aspiration pneumonia   . COPD (chronic obstructive pulmonary disease)   . Acute respiratory failure 12/21/10  . Pulmonary hypertension   . Chronic constipation   . Rectal cancer     s/p colostomy  . Thyroid goiter   . 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 Vascular dementia with depressed mood   . Pneumonia 12/21/10  . Delirium, induced by drug     polypharmacy  . Sacral fracture, closed     s/p kyphoplasty  . Fall     history of falls  . Dementia   . GERD (gastroesophageal reflux disease)   . Major depressive disorder   . CKD (chronic kidney disease)   . Myocardial infarction     "I've had about 3" (11/06/2014)  . 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 On home oxygen therapy     "prn at the nursing home" (11/06/2014)  . Migraine     "monthly" (11/06/2014)  . Stroke 2011    postop  . Stroke ?2014    "left side sometimes weaker since" (11/06/2014)   Past Surgical History:  Past Surgical History  Procedure Laterality Date  . Kyphoplasty      sacrum  . Colostomy      rectal ca  . Appendectomy    . Tonsillectomy    . Abdominal hysterectomy    . Tubal ligation    . Colon surgery     HPI:  Sheila Flynn is a 68 y.o. female with Past medical history of hypertension, chronic diastolic heart failure, coronary artery disease, COPD, diabetes mellitus, dyslipidemia, pulmonary hypertension,  colostomy, dementia, GERD. The patient is a nursing home patient who presented with complaints of shortness of breath that has been ongoing for last few days. She had an MBS in 2011 that showed a cognitively based dysphagia related to altered mental stautus.   Assessment / Plan / Recommendation Clinical Impression  Pt demonstrates adequate oropharyngeal swallow function at this time. Recommend regular diet with thin liquids. No further speech treatment is needed, SLP signing off.    Aspiration Risk  Mild    Diet Recommendation Regular;Thin liquid   Liquid Administration via: Cup;Straw Medication Administration: Whole meds with liquid Supervision: Patient able to self feed Postural Changes and/or Swallow Maneuvers: Seated upright 90 degrees    Other  Recommendations Oral Care Recommendations: Oral care BID   Follow Up Recommendations       Frequency and Duration        Pertinent Vitals/Pain none    SLP Swallow Goals     Swallow Study Prior Functional Status       General Date of Onset: 11/06/14 HPI: Sheila Flynn is a 68 y.o. female with Past medical history of hypertension, chronic diastolic heart failure, coronary artery disease, COPD, diabetes mellitus, dyslipidemia, pulmonary hypertension, colostomy, dementia, GERD. The patient is a nursing home patient who presented with complaints of shortness of  breath that has been ongoing for last few days. She had an MBS in 2011 that showed a cognitively based dysphagia related to altered mental stautus. Type of Study: Bedside swallow evaluation Previous Swallow Assessment: MBS, 2011 Diet Prior to this Study: Regular;Thin liquids Temperature Spikes Noted: No Respiratory Status: Room air History of Recent Intubation: No Behavior/Cognition: Alert;Cooperative Oral Cavity - Dentition: Adequate natural dentition Self-Feeding Abilities: Able to feed self Patient Positioning: Upright in bed Baseline Vocal Quality:  Clear Volitional Cough: Weak Volitional Swallow: Able to elicit    Oral/Motor/Sensory Function Overall Oral Motor/Sensory Function: Appears within functional limits for tasks assessed   Ice Chips     Thin Liquid Thin Liquid: Within functional limits Presentation: Cup;Self Fed;Straw    Nectar Thick     Honey Thick     Puree Puree: Within functional limits Presentation: Self Fed;Spoon   Solid   GO    Solid: Within functional limits Presentation: Self Fed       Eden Emms 11/08/2014,11:00 AM

## 2014-11-08 NOTE — Discharge Summary (Signed)
Physician Discharge Summary  Sheila Flynn VCB:[id number] DOB: [birth year] DOA: 11/06/2014  PCP: Hollace Kinnier, DO  Admit date: 11/06/2014 Discharge date: 11/08/2014  Time spent: 45 minutes  Recommendations for Outpatient Follow-up:  1. Follow up with primary MD in 1-2 weeks   Discharge Diagnoses:  Principal Problem:   HCAP (healthcare-associated pneumonia) Active Problems:   ADENOCARCINOMA, RECTUM   Essential hypertension   Chronic diastolic CHF (congestive heart failure)   CAD (coronary artery disease)   DM (diabetes mellitus) type II controlled peripheral vascular disorder   Depression due to dementia   COPD exacerbation  Discharge Condition: stable  Diet recommendation: regular  Filed Weights   11/06/14 2102 11/07/14 0524 11/08/14 0634  Weight: 78.79 kg (173 lb 11.2 oz) 79.425 kg (175 lb 1.6 oz) 81.375 kg (179 lb 6.4 oz)   History of present illness:  Sheila Flynn is a 68 y.o. female with Past medical history of hypertension, chronic diastolic heart failure, coronary artery disease, COPD, diabetes mellitus, dyslipidemia, pulmonary hypertension, colostomy, dementia. The patient is a nursing home patient who presented with complaints of shortness of breath that has been ongoing for last few days. Certainly worsened today. She along with that has also cough. She brings up some yellow-green sputum. She denies any fever or chills complains of some nausea and vomiting she felt that she was feeling warm. She denies any chest pain. Denies any abdominal pain. She denies any diarrhea. She denies any burning urination or leg swelling or orthopnea. When EMS found the patient she was having wheezing as well as dried her reportedly and was given duo nebs. In the hospital she was found to have wheezing but no stridor. Patient was apparently started on losartan recently.  Hospital Course:  Patient was admitted to the hospital with healthcare associated pneumonia since she is a  nursing home resident, with respiratory symptoms and with a chest x-ray concerning for retrocardiac opacity. She was started on empiric levofloxacin, and given wheezes on admission she was started. Her respiratory status improved after the first day in the hospital, and she had no further dyspnea or wheezing, however her cough was still present. Given her history of prior strokes and the presentation with pneumonia, speech pathology was asked to evaluate for aspiration, and she was cleared for regular diet demonstrating adequate oropharyngeal swallow function. She was discharged back to the skilled nursing facility in stable condition, she is to complete treatment with antibiotics for 3 additional doses as well as a steroid taper. Per family, patient is back to baseline at the time of discharge. Thyroid goiter - recommend ongoing outpatient management HTN - continue home medications GERD - continue PPI HLD - continue Lipitor Chronic diastolic heart failure - continue home medications  Dementia - continue monitoring   Procedures:  None    Consultations:  None   Discharge Exam: Filed Vitals:   11/08/14 0634 11/08/14 0833 11/08/14 1336 11/08/14 1357  BP: 179/103  154/67   Pulse: 96 94 83 89  Temp: 98.3 F (36.8 C)  98.3 F (36.8 C)   TempSrc: Oral  Oral   Resp: 22 18 20 20   Height:      Weight: 81.375 kg (179 lb 6.4 oz)     SpO2: 90% 91% 98% 98%    General: NAD Cardiovascular: RRR Respiratory: CTA biL  Discharge Instructions     Medication List    TAKE these medications        acetaminophen 325 MG tablet  Commonly known  as:  TYLENOL  Take 650 mg by mouth every 6 (six) hours as needed for mild pain.     amLODipine 10 MG tablet  Commonly known as:  NORVASC  Take 10 mg by mouth daily.     aspirin EC 81 MG EC tablet  Generic drug:  aspirin  Take 81 mg by mouth daily. Swallow whole.     atorvastatin 20 MG tablet  Commonly known as:  LIPITOR  Take 20 mg by mouth at  bedtime.     carvedilol 3.125 MG tablet  Commonly known as:  COREG  Take 3.125 mg by mouth 2 (two) times daily with a meal.     cloNIDine 0.1 MG tablet  Commonly known as:  CATAPRES  Take 0.1 mg by mouth 2 (two) times daily.     docusate sodium 100 MG capsule  Commonly known as:  COLACE  Take 100 mg by mouth 3 (three) times daily.     glipiZIDE 5 MG tablet  Commonly known as:  GLUCOTROL  Take by mouth daily before breakfast.     guaiFENesin 600 MG 12 hr tablet  Commonly known as:  MUCINEX  Take 1 tablet (600 mg total) by mouth 2 (two) times daily.     hydrALAZINE 25 MG tablet  Commonly known as:  APRESOLINE  Take 25 mg by mouth 4 (four) times daily.     ipratropium-albuterol 0.5-2.5 (3) MG/3ML Soln  Commonly known as:  DUONEB  Take 3 mLs by nebulization every 6 (six) hours as needed (shortness of breathe/wheezing).     isosorbide mononitrate 20 MG tablet  Commonly known as:  ISMO,MONOKET  Take 60 mg by mouth daily.     levofloxacin 750 MG tablet  Commonly known as:  LEVAQUIN  Take 1 tablet (750 mg total) by mouth every other day.     losartan 25 MG tablet  Commonly known as:  COZAAR  Take 12.5 mg by mouth daily.     nitroGLYCERIN 0.4 MG SL tablet  Commonly known as:  NITROSTAT  Place 0.4 mg under the tongue every 5 (five) minutes as needed for chest pain.     omeprazole 20 MG capsule  Commonly known as:  PRILOSEC  Take 20 mg by mouth daily.     PARoxetine 20 MG tablet  Commonly known as:  PAXIL  Take 30 mg by mouth daily.     polyvinyl alcohol 1.4 % ophthalmic solution  Commonly known as:  LIQUIFILM TEARS  Place 2 drops into both eyes 3 (three) times daily as needed for dry eyes.     predniSONE 20 MG tablet  Commonly known as:  DELTASONE  Take 1 tablet (20 mg total) by mouth daily with breakfast.     ranitidine 150 MG capsule  Commonly known as:  ZANTAC  Take 150 mg by mouth every evening.     sennosides-docusate sodium 8.6-50 MG tablet  Commonly  known as:  SENOKOT-S  Take 2 tablets by mouth every evening.     traMADol 50 MG tablet  Commonly known as:  ULTRAM  Take one tablet by mouth every 8 hours as needed for pain     Vitamin D (Ergocalciferol) 50000 UNITS Caps capsule  Commonly known as:  DRISDOL  Take 50,000 Units by mouth every 30 (thirty) days.           Follow-up Information    Follow up with REED, TIFFANY, DO. Schedule an appointment as soon as possible for a visit in 1  week.   Specialty:  Geriatric Medicine   Contact information:   [address] [place] [address] [phone]       The results of significant diagnostics from this hospitalization (including imaging, microbiology, ancillary and laboratory) are listed below for reference.    Significant Diagnostic Studies: Dg Chest Portable 1 View  11/06/2014   CLINICAL DATA:  Shortness of breath, onset earlier today.  EXAM: PORTABLE CHEST - 1 VIEW  COMPARISON:  Radiograph 12/12/2010, CT 12/09/2010  FINDINGS: Lung volumes are low. Stable cardiomegaly. There is probable retrocardiac opacity. Thickening of the paratracheal stripes with mass effect on the trachea consistent with substernal extension of goiter, as seen on previous CT. No large pleural effusion or pneumothorax.  IMPRESSION: Probable left lower lobe retrocardiac opacity, atelectasis versus pneumonia. Grossly stable cardiomegaly allowing for differences in technique.   Electronically Signed   By: Jeb Levering M.D.   On: 11/06/2014 18:25    Microbiology: Recent Results (from the past 240 hour(s))  Culture, sputum-assessment     Status: None   Collection Time: 11/07/14  6:06 PM  Result Value Ref Range Status   Specimen Description SPUTUM  Final   Special Requests NONE  Final   Sputum evaluation   Final    THIS SPECIMEN IS ACCEPTABLE. RESPIRATORY CULTURE REPORT TO FOLLOW.   Report Status 11/08/2014 FINAL  Final  Culture, respiratory (NON-Expectorated)     Status: None (Preliminary result)    Collection Time: 11/07/14  6:06 PM  Result Value Ref Range Status   Specimen Description SPUTUM  Final   Special Requests NONE  Final   Gram Stain   Final    ABUNDANT WBC PRESENT,BOTH PMN AND MONONUCLEAR NO SQUAMOUS EPITHELIAL CELLS SEEN NO ORGANISMS SEEN Performed at [facility]    Culture PENDING  Incomplete   Report Status PENDING  Incomplete     Labs: Basic Metabolic Panel:  Recent Labs Lab 11/06/14 1734 11/07/14 0820  NA 142 140  K 3.3* 3.8  CL 103 104  CO2 27 26  GLUCOSE 108* 145*  BUN 15 16  CREATININE 1.42* 1.32*  CALCIUM 9.0 9.0   Liver Function Tests:  Recent Labs Lab 11/07/14 0820  AST 19  ALT 16  ALKPHOS 73  BILITOT 0.4  PROT 7.3  ALBUMIN 3.5   CBC:  Recent Labs Lab 11/06/14 1734 11/07/14 0820  WBC 7.3 7.0  NEUTROABS  --  6.4  HGB 12.1 11.5*  HCT 38.5 35.8*  MCV 81.9 81.4  PLT 227 237   Cardiac Enzymes:  Recent Labs Lab 11/06/14 1734  TROPONINI <0.03   CBG:  Recent Labs Lab 11/07/14 1130 11/07/14 1705 11/07/14 2125 11/08/14 0802 11/08/14 1133  GLUCAP 138* 208* 156* 154* 200*    Signed:  Bryelle Spiewak  [facility] 11/08/2014, 2:46 PM

## 2014-11-11 LAB — CULTURE, RESPIRATORY W GRAM STAIN: Culture: NORMAL

## 2014-11-11 LAB — CULTURE, RESPIRATORY

## 2014-11-12 ENCOUNTER — Other Ambulatory Visit: Payer: Self-pay | Admitting: Internal Medicine

## 2014-11-12 ENCOUNTER — Other Ambulatory Visit: Payer: Self-pay | Admitting: *Deleted

## 2014-11-12 DIAGNOSIS — E119 Type 2 diabetes mellitus without complications: Secondary | ICD-10-CM | POA: Diagnosis not present

## 2014-11-12 DIAGNOSIS — E039 Hypothyroidism, unspecified: Secondary | ICD-10-CM | POA: Diagnosis not present

## 2014-11-12 DIAGNOSIS — I10 Essential (primary) hypertension: Secondary | ICD-10-CM | POA: Diagnosis not present

## 2014-11-12 DIAGNOSIS — D649 Anemia, unspecified: Secondary | ICD-10-CM | POA: Diagnosis not present

## 2014-11-12 LAB — CBC
HCT: 36.4 % (ref 36.0–46.0)
Hemoglobin: 11.5 g/dL — ABNORMAL LOW (ref 12.0–15.0)
MCH: 25.7 pg — ABNORMAL LOW (ref 26.0–34.0)
MCHC: 31.7 g/dL (ref 30.0–36.0)
MCV: 81.4 fL (ref 78.0–100.0)
Platelets: 237 10*3/uL (ref 150–400)
RBC: 4.47 MIL/uL (ref 3.87–5.11)
RDW: 16.8 % — ABNORMAL HIGH (ref 11.5–15.5)
WBC: 5.5 10*3/uL (ref 4.0–10.5)

## 2014-11-12 LAB — THYROID PANEL WITH TSH
Free Thyroxine Index: 3 (ref 1.4–3.8)
T3 Uptake: 31 % (ref 22–35)
T4, Total: 9.6 ug/dL (ref 4.5–12.0)
TSH: 0.487 u[IU]/mL (ref 0.350–4.500)

## 2014-11-12 LAB — COMPREHENSIVE METABOLIC PANEL
ALT: 12 U/L (ref 0–35)
AST: 14 U/L (ref 0–37)
Albumin: 3.5 g/dL (ref 3.5–5.2)
Alkaline Phosphatase: 58 U/L (ref 39–117)
BUN: 21 mg/dL (ref 6–23)
CO2: 24 mEq/L (ref 19–32)
Calcium: 9 mg/dL (ref 8.4–10.5)
Chloride: 102 mEq/L (ref 96–112)
Creat: 1.4 mg/dL — ABNORMAL HIGH (ref 0.50–1.10)
Glucose, Bld: 50 mg/dL — ABNORMAL LOW (ref 70–99)
Potassium: 4.2 mEq/L (ref 3.5–5.3)
Sodium: 140 mEq/L (ref 135–145)
Total Bilirubin: 0.3 mg/dL (ref 0.2–1.2)
Total Protein: 6.3 g/dL (ref 6.0–8.3)

## 2014-11-12 LAB — HEMOGLOBIN A1C
Hgb A1c MFr Bld: 6.1 % — ABNORMAL HIGH (ref <5.7)
Mean Plasma Glucose: 128 mg/dL — ABNORMAL HIGH (ref <117)

## 2014-11-12 MED ORDER — TRAMADOL HCL 50 MG PO TABS: ORAL_TABLET | ORAL | Status: DC

## 2014-11-12 NOTE — Telephone Encounter (Signed)
[facility] 

## 2014-11-13 ENCOUNTER — Non-Acute Institutional Stay (SKILLED_NURSING_FACILITY): Payer: Medicare Other | Admitting: Internal Medicine

## 2014-11-13 DIAGNOSIS — J438 Other emphysema: Secondary | ICD-10-CM

## 2014-11-13 DIAGNOSIS — I5032 Chronic diastolic (congestive) heart failure: Secondary | ICD-10-CM

## 2014-11-13 DIAGNOSIS — E1151 Type 2 diabetes mellitus with diabetic peripheral angiopathy without gangrene: Secondary | ICD-10-CM

## 2014-11-13 DIAGNOSIS — I10 Essential (primary) hypertension: Secondary | ICD-10-CM | POA: Diagnosis not present

## 2014-11-13 DIAGNOSIS — Z933 Colostomy status: Secondary | ICD-10-CM

## 2014-11-13 DIAGNOSIS — E1159 Type 2 diabetes mellitus with other circulatory complications: Secondary | ICD-10-CM

## 2014-11-13 DIAGNOSIS — J189 Pneumonia, unspecified organism: Secondary | ICD-10-CM | POA: Diagnosis not present

## 2014-11-16 DIAGNOSIS — R2689 Other abnormalities of gait and mobility: Secondary | ICD-10-CM | POA: Diagnosis not present

## 2014-11-16 DIAGNOSIS — E119 Type 2 diabetes mellitus without complications: Secondary | ICD-10-CM | POA: Diagnosis not present

## 2014-11-16 DIAGNOSIS — K219 Gastro-esophageal reflux disease without esophagitis: Secondary | ICD-10-CM | POA: Diagnosis not present

## 2014-11-16 DIAGNOSIS — F0280 Dementia in other diseases classified elsewhere without behavioral disturbance: Secondary | ICD-10-CM | POA: Diagnosis not present

## 2014-11-16 DIAGNOSIS — F329 Major depressive disorder, single episode, unspecified: Secondary | ICD-10-CM | POA: Diagnosis not present

## 2014-11-16 DIAGNOSIS — N189 Chronic kidney disease, unspecified: Secondary | ICD-10-CM | POA: Diagnosis not present

## 2014-11-16 DIAGNOSIS — I10 Essential (primary) hypertension: Secondary | ICD-10-CM | POA: Diagnosis not present

## 2014-11-16 DIAGNOSIS — J189 Pneumonia, unspecified organism: Secondary | ICD-10-CM | POA: Diagnosis not present

## 2014-11-16 DIAGNOSIS — J449 Chronic obstructive pulmonary disease, unspecified: Secondary | ICD-10-CM | POA: Diagnosis not present

## 2014-11-16 DIAGNOSIS — I6789 Other cerebrovascular disease: Secondary | ICD-10-CM | POA: Diagnosis not present

## 2014-11-16 DIAGNOSIS — E785 Hyperlipidemia, unspecified: Secondary | ICD-10-CM | POA: Diagnosis not present

## 2014-11-16 DIAGNOSIS — I5032 Chronic diastolic (congestive) heart failure: Secondary | ICD-10-CM | POA: Diagnosis not present

## 2014-11-16 DIAGNOSIS — M6281 Muscle weakness (generalized): Secondary | ICD-10-CM | POA: Diagnosis not present

## 2014-11-20 DIAGNOSIS — F329 Major depressive disorder, single episode, unspecified: Secondary | ICD-10-CM | POA: Diagnosis not present

## 2014-11-20 DIAGNOSIS — I10 Essential (primary) hypertension: Secondary | ICD-10-CM | POA: Diagnosis not present

## 2014-11-20 DIAGNOSIS — E785 Hyperlipidemia, unspecified: Secondary | ICD-10-CM | POA: Diagnosis not present

## 2014-11-20 DIAGNOSIS — I5032 Chronic diastolic (congestive) heart failure: Secondary | ICD-10-CM | POA: Diagnosis not present

## 2014-11-20 DIAGNOSIS — E119 Type 2 diabetes mellitus without complications: Secondary | ICD-10-CM | POA: Diagnosis not present

## 2014-11-20 DIAGNOSIS — F0280 Dementia in other diseases classified elsewhere without behavioral disturbance: Secondary | ICD-10-CM | POA: Diagnosis not present

## 2014-11-21 DIAGNOSIS — I10 Essential (primary) hypertension: Secondary | ICD-10-CM | POA: Diagnosis not present

## 2014-11-21 DIAGNOSIS — F0280 Dementia in other diseases classified elsewhere without behavioral disturbance: Secondary | ICD-10-CM | POA: Diagnosis not present

## 2014-11-21 DIAGNOSIS — I5032 Chronic diastolic (congestive) heart failure: Secondary | ICD-10-CM | POA: Diagnosis not present

## 2014-11-21 DIAGNOSIS — E119 Type 2 diabetes mellitus without complications: Secondary | ICD-10-CM | POA: Diagnosis not present

## 2014-11-21 DIAGNOSIS — F329 Major depressive disorder, single episode, unspecified: Secondary | ICD-10-CM | POA: Diagnosis not present

## 2014-11-21 DIAGNOSIS — E785 Hyperlipidemia, unspecified: Secondary | ICD-10-CM | POA: Diagnosis not present

## 2014-11-22 DIAGNOSIS — I5032 Chronic diastolic (congestive) heart failure: Secondary | ICD-10-CM | POA: Diagnosis not present

## 2014-11-22 DIAGNOSIS — I10 Essential (primary) hypertension: Secondary | ICD-10-CM | POA: Diagnosis not present

## 2014-11-22 DIAGNOSIS — E119 Type 2 diabetes mellitus without complications: Secondary | ICD-10-CM | POA: Diagnosis not present

## 2014-11-22 DIAGNOSIS — F0280 Dementia in other diseases classified elsewhere without behavioral disturbance: Secondary | ICD-10-CM | POA: Diagnosis not present

## 2014-11-22 DIAGNOSIS — E785 Hyperlipidemia, unspecified: Secondary | ICD-10-CM | POA: Diagnosis not present

## 2014-11-22 DIAGNOSIS — F329 Major depressive disorder, single episode, unspecified: Secondary | ICD-10-CM | POA: Diagnosis not present

## 2014-11-23 DIAGNOSIS — I5032 Chronic diastolic (congestive) heart failure: Secondary | ICD-10-CM | POA: Diagnosis not present

## 2014-11-23 DIAGNOSIS — F0280 Dementia in other diseases classified elsewhere without behavioral disturbance: Secondary | ICD-10-CM | POA: Diagnosis not present

## 2014-11-23 DIAGNOSIS — F329 Major depressive disorder, single episode, unspecified: Secondary | ICD-10-CM | POA: Diagnosis not present

## 2014-11-23 DIAGNOSIS — E119 Type 2 diabetes mellitus without complications: Secondary | ICD-10-CM | POA: Diagnosis not present

## 2014-11-23 DIAGNOSIS — I10 Essential (primary) hypertension: Secondary | ICD-10-CM | POA: Diagnosis not present

## 2014-11-23 DIAGNOSIS — E785 Hyperlipidemia, unspecified: Secondary | ICD-10-CM | POA: Diagnosis not present

## 2014-11-24 DIAGNOSIS — F0280 Dementia in other diseases classified elsewhere without behavioral disturbance: Secondary | ICD-10-CM | POA: Diagnosis not present

## 2014-11-24 DIAGNOSIS — E785 Hyperlipidemia, unspecified: Secondary | ICD-10-CM | POA: Diagnosis not present

## 2014-11-24 DIAGNOSIS — E119 Type 2 diabetes mellitus without complications: Secondary | ICD-10-CM | POA: Diagnosis not present

## 2014-11-24 DIAGNOSIS — I10 Essential (primary) hypertension: Secondary | ICD-10-CM | POA: Diagnosis not present

## 2014-11-24 DIAGNOSIS — F329 Major depressive disorder, single episode, unspecified: Secondary | ICD-10-CM | POA: Diagnosis not present

## 2014-11-24 DIAGNOSIS — I5032 Chronic diastolic (congestive) heart failure: Secondary | ICD-10-CM | POA: Diagnosis not present

## 2014-11-25 ENCOUNTER — Encounter: Payer: Self-pay | Admitting: Internal Medicine

## 2014-11-25 NOTE — Progress Notes (Signed)
Patient ID: Sheila Flynn, female   DOB: 10-Jun-1946, 69 y.o.   MRN: [id number]    HISTORY AND PHYSICAL  Location:  [place] of Service: [phone])   Extended Emergency Contact Information Primary Emergency Contact: Lindsay,Teresa Address: [address], [place] Phone: [phone] Relation: Daughter Secondary Emergency Contact: Carlyle Basques Address: [address] [place], [place] [address] Johnnette Litter of [employer] Phone: [phone] Relation: Grandaughter  Advanced Directive information  Full Code  Chief Complaint  Patient presents with  . Readmit To SNF    HCAP, rectal CA hx s/p colostomy, dementia, HTN, chronic diastolic HF, DM type 2 with PVD, COPD and HTN    HPI:  69 yo female seen today as a readmission into SNF for above. She is taking levaquin for pneumonia every 2 days. She reports her SOB has improved. No f/c. Sleeping well. Appetite stable. No nursing issues. She has not had CBGs since arrival.  No low BS reactions.  Past Medical History  Diagnosis Date  . 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 Chronic diastolic CHF (congestive heart failure)   . CAD (coronary artery disease)     Dr. Matthew Saras  . Angina at rest   . Hyperlipidemia LDL goal < 100   . Aspiration pneumonia   . COPD (chronic obstructive pulmonary disease)   . Acute respiratory failure 12/21/10  . Pulmonary hypertension   . Chronic constipation   . Rectal cancer     s/p colostomy  . Thyroid goiter   . 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 Vascular dementia with depressed mood   . Pneumonia 12/21/10  . Delirium, induced by drug     polypharmacy  . Sacral fracture, closed     s/p kyphoplasty  . Fall     history of falls  . Dementia   . GERD (gastroesophageal reflux disease)   . Major depressive disorder   . CKD  (chronic kidney disease)   . Myocardial infarction     "I've had about 3" (11/06/2014)  . 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 On home oxygen therapy     "prn at the nursing home" (11/06/2014)  . Migraine     "monthly" (11/06/2014)  . Stroke 2011    postop  . Stroke ?2014    "left side sometimes weaker since" (11/06/2014)    Past Surgical History  Procedure Laterality Date  . Kyphoplasty      sacrum  . Colostomy      rectal ca  . Appendectomy    . Tonsillectomy    . Abdominal hysterectomy    . Tubal ligation    . Colon surgery      Patient Care Team: Gayland Curry, DO as PCP - General (Geriatric Medicine)  History   Social History  . Marital Status: Widowed    Spouse Name: N/A    Number of Children: N/A  . Years of Education: N/A   Occupational History  .      custodial   Social History Main Topics  . Smoking status: Former Smoker -- 0.50 packs/day for 2 years    Types: Cigarettes  . Smokeless tobacco: Never Used     Comment: "quit smoking cigarettes in 1969"  .  Alcohol Use: No  . Drug Use: Yes     Comment: previously smoked crack, has had positive UDS for amphetamines and narcotics  . Sexual Activity: No   Other Topics Concern  . Not on file   Social History Narrative   Born in [place], has some high school education, retired from custodian job, divorced, has children     reports that she has quit smoking. Her smoking use included Cigarettes. She has a 1 pack-year smoking history. She has never used smokeless tobacco. She reports that she uses illicit drugs. She reports that she does not drink alcohol.  No family history on file. Family Status  Relation Status Death Age  . Mother Deceased   . Father Deceased   . Sister Deceased   . Brother Alive   . Brother Alive   . Daughter Alive   . Daughter Alive   . Son Alive      There is no immunization history on file for this patient.  Allergies    Allergen Reactions  . Ace Inhibitors Swelling    Medications: Patient's Medications  New Prescriptions   No medications on file  Previous Medications   ACETAMINOPHEN (TYLENOL) 325 MG TABLET    Take 650 mg by mouth every 6 (six) hours as needed for mild pain.   AMLODIPINE (NORVASC) 10 MG TABLET    Take 10 mg by mouth daily.   ASPIRIN (ASPIRIN EC) 81 MG EC TABLET    Take 81 mg by mouth daily. Swallow whole.   ATORVASTATIN (LIPITOR) 20 MG TABLET    Take 20 mg by mouth at bedtime.    CARVEDILOL (COREG) 3.125 MG TABLET    Take 3.125 mg by mouth 2 (two) times daily with a meal.   CLONIDINE (CATAPRES) 0.1 MG TABLET    Take 0.1 mg by mouth 2 (two) times daily.    DOCUSATE SODIUM (COLACE) 100 MG CAPSULE    Take 100 mg by mouth 3 (three) times daily.   GLIPIZIDE (GLUCOTROL) 5 MG TABLET    Take by mouth daily before breakfast.   GUAIFENESIN (MUCINEX) 600 MG 12 HR TABLET    Take 1 tablet (600 mg total) by mouth 2 (two) times daily.   HYDRALAZINE (APRESOLINE) 25 MG TABLET    Take 25 mg by mouth 4 (four) times daily.    IPRATROPIUM-ALBUTEROL (DUONEB) 0.5-2.5 (3) MG/3ML SOLN    Take 3 mLs by nebulization every 6 (six) hours as needed (shortness of breathe/wheezing).   ISOSORBIDE MONONITRATE (ISMO,MONOKET) 20 MG TABLET    Take 60 mg by mouth daily.    LEVOFLOXACIN (LEVAQUIN) 750 MG TABLET    Take 1 tablet (750 mg total) by mouth every other day.   LOSARTAN (COZAAR) 25 MG TABLET    Take 12.5 mg by mouth daily.   NITROGLYCERIN (NITROSTAT) 0.4 MG SL TABLET    Place 0.4 mg under the tongue every 5 (five) minutes as needed for chest pain.   OMEPRAZOLE (PRILOSEC) 20 MG CAPSULE    Take 20 mg by mouth daily.   PAROXETINE (PAXIL) 20 MG TABLET    Take 30 mg by mouth daily.    POLYVINYL ALCOHOL (LIQUIFILM TEARS) 1.4 % OPHTHALMIC SOLUTION    Place 2 drops into both eyes 3 (three) times daily as needed for dry eyes.   PREDNISONE (DELTASONE) 20 MG TABLET    Take 1 tablet (20 mg total) by mouth daily with breakfast.    RANITIDINE (ZANTAC) 150 MG CAPSULE      Take 150 mg by mouth every evening.   SENNOSIDES-DOCUSATE SODIUM (SENOKOT-S) 8.6-50 MG TABLET    Take 2 tablets by mouth every evening.    TRAMADOL (ULTRAM) 50 MG TABLET    Take one tablet by mouth every 8 hours as needed for pain   VITAMIN D, ERGOCALCIFEROL, (DRISDOL) 50000 UNITS CAPS CAPSULE    Take 50,000 Units by mouth every 30 (thirty) days.  Modified Medications   No medications on file  Discontinued Medications   No medications on file    Review of Systems  As above. All other systems reviewed are negative  Filed Vitals:   11/25/14 2059  BP: 132/69  Pulse: 70  Temp: 97.8 F (36.6 C)  Weight: 175 lb (79.379 kg)   Body mass index is 26.31 kg/(m^2).  Physical Exam  CONSTITUTIONAL: Looks well in NAD. Awake and alert HEENT: PERRLA. Oropharynx clear and without exudate. MMM NECK: Supple. Nontender. No palpable cervical or supraclavicular lymph nodes. No carotid bruit b/l.   CVS: Regular rate with ectopy but no murmur, gallop or rub. LUNGS: reduced BS at base with congestion but no wheezing. ABDOMEN: Bowel sounds present x 4. Soft, nontender, nondistended. No palpable mass or bruit. Colostomy intact EXTREMITIES: R>LLE edema. Distal pulses palpable. No calf tenderness PSYCH: Affect, behavior and mood normal  Labs reviewed: Orders Only on 11/12/2014  Component Date Value Ref Range Status  . WBC 11/12/2014 5.5  4.0 - 10.5 K/uL Final  . RBC 11/12/2014 4.47  3.87 - 5.11 MIL/uL Final  . Hemoglobin 11/12/2014 11.5* 12.0 - 15.0 g/dL Final  . HCT 11/12/2014 36.4  36.0 - 46.0 % Final  . MCV 11/12/2014 81.4  78.0 - 100.0 fL Final  . MCH 11/12/2014 25.7* 26.0 - 34.0 pg Final  . MCHC 11/12/2014 31.7  30.0 - 36.0 g/dL Final  . RDW 11/12/2014 16.8* 11.5 - 15.5 % Final  . Platelets 11/12/2014 237  150 - 400 K/uL Final  . MPV 11/12/2014 Not Performed  8.6 - 12.4 fL Final   ** Please note change in reference range(s). **  . Hgb A1c MFr Bld  11/12/2014 6.1* <5.7 % Final   Comment:                                                                        According to the ADA Clinical Practice Recommendations for 2011, when HbA1c is used as a screening test:     >=6.5%   Diagnostic of Diabetes Mellitus            (if abnormal result is confirmed)   5.7-6.4%   Increased risk of developing Diabetes Mellitus   References:Diagnosis and Classification of Diabetes Mellitus,Diabetes FTDD,2202,54(YHCWC 1):S62-S69 and Standards of Medical Care in         Diabetes - 2011,Diabetes BJSE,8315,17 (Suppl 1):S11-S61.     . Mean Plasma Glucose 11/12/2014 128* <117 mg/dL Final  . T4, Total 11/12/2014 9.6  4.5 - 12.0 ug/dL Final  . T3 Uptake 11/12/2014 31  22 - 35 % Final  . Free Thyroxine Index 11/12/2014 3.0  1.4 - 3.8 Final  . TSH 11/12/2014 0.487  0.350 - 4.500 uIU/mL Final  . Sodium 11/12/2014 140  135 - 145 mEq/L Final  .  Potassium 11/12/2014 4.2  3.5 - 5.3 mEq/L Final  . Chloride 11/12/2014 102  96 - 112 mEq/L Final  . CO2 11/12/2014 24  19 - 32 mEq/L Final  . Glucose, Bld 11/12/2014 50* 70 - 99 mg/dL Final  . BUN 11/12/2014 21  6 - 23 mg/dL Final  . Creat 11/12/2014 1.40* 0.50 - 1.10 mg/dL Final  . Total Bilirubin 11/12/2014 0.3  0.2 - 1.2 mg/dL Final  . Alkaline Phosphatase 11/12/2014 58  39 - 117 U/L Final  . AST 11/12/2014 14  0 - 37 U/L Final  . ALT 11/12/2014 12  0 - 35 U/L Final  . Total Protein 11/12/2014 6.3  6.0 - 8.3 g/dL Final  . Albumin 11/12/2014 3.5  3.5 - 5.2 g/dL Final  . Calcium 11/12/2014 9.0  8.4 - 10.5 mg/dL Final  Admission on 11/06/2014, Discharged on 11/08/2014  Component Date Value Ref Range Status  . Sodium 11/06/2014 142  135 - 145 mmol/L Final   Please note change in reference range.  . Potassium 11/06/2014 3.3* 3.5 - 5.1 mmol/L Final   Please note change in reference range.  . Chloride 11/06/2014 103  96 - 112 mEq/L Final  . CO2 11/06/2014 27  19 - 32 mmol/L Final  . Glucose, Bld 11/06/2014 108* 70 -  99 mg/dL Final  . BUN 11/06/2014 15  6 - 23 mg/dL Final  . Creatinine, Ser 11/06/2014 1.42* 0.50 - 1.10 mg/dL Final  . Calcium 11/06/2014 9.0  8.4 - 10.5 mg/dL Final  . GFR calc non Af Amer 11/06/2014 37* >90 mL/min Final  . GFR calc Af Amer 11/06/2014 43* >90 mL/min Final   Comment: (NOTE) The eGFR has been calculated using the CKD EPI equation. This calculation has not been validated in all clinical situations. eGFR's persistently <90 mL/min signify possible Chronic Kidney Disease.   . Anion gap 11/06/2014 12  5 - 15 Final  . WBC 11/06/2014 7.3  4.0 - 10.5 K/uL Final  . RBC 11/06/2014 4.70  3.87 - 5.11 MIL/uL Final  . Hemoglobin 11/06/2014 12.1  12.0 - 15.0 g/dL Final  . HCT 11/06/2014 38.5  36.0 - 46.0 % Final  . MCV 11/06/2014 81.9  78.0 - 100.0 fL Final  . MCH 11/06/2014 25.7* 26.0 - 34.0 pg Final  . MCHC 11/06/2014 31.4  30.0 - 36.0 g/dL Final  . RDW 11/06/2014 16.0* 11.5 - 15.5 % Final  . Platelets 11/06/2014 227  150 - 400 K/uL Final  . Troponin i, poc 11/06/2014 0.01  0.00 - 0.08 ng/mL Final  . Comment 3 11/06/2014          Final   Comment: Due to the release kinetics of cTnI, a negative result within the first hours of the onset of symptoms does not rule out myocardial infarction with certainty. If myocardial infarction is still suspected, repeat the test at appropriate intervals.   . Troponin I 11/06/2014 <0.03  <0.031 ng/mL Final   Comment:        NO INDICATION OF MYOCARDIAL INJURY. Please note change in reference range.   . Specimen Description 11/07/2014 SPUTUM   Final  . Special Requests 11/07/2014 NONE   Final  . Sputum evaluation 11/07/2014 THIS SPECIMEN IS ACCEPTABLE. RESPIRATORY CULTURE REPORT TO FOLLOW.   Final  . Report Status 11/07/2014 11/08/2014 FINAL   Final  . Specimen Description 11/06/2014 URINE, RANDOM   Final  . Special Requests 11/06/2014 NONE   Final  . Legionella Antigen, Urine 11/06/2014      Final                   Value:Negative for  Legionella pneumophila serogroup 1                                                              Legionella pneumophila serogroup 1 antigen can be detected in urine within 2 to 3 days of infection and may persist even after treatment. This  assay does not detect other Legionella species or serogroups. Performed at [facility]   . Report Status 11/06/2014 11/07/2014 FINAL   Final  . Strep Pneumo Urinary Antigen 11/06/2014 NEGATIVE  NEGATIVE Final   Comment:        Infection due to S. pneumoniae cannot be absolutely ruled out since the antigen present may be below the detection limit of the test.   . Glucose-Capillary 11/06/2014 115* 70 - 99 mg/dL Final  . Comment 1 11/06/2014 Notify RN   Final  . WBC 11/07/2014 7.0  4.0 - 10.5 K/uL Final  . RBC 11/07/2014 4.40  3.87 - 5.11 MIL/uL Final  . Hemoglobin 11/07/2014 11.5* 12.0 - 15.0 g/dL Final  . HCT 11/07/2014 35.8* 36.0 - 46.0 % Final  . MCV 11/07/2014 81.4  78.0 - 100.0 fL Final  . MCH 11/07/2014 26.1  26.0 - 34.0 pg Final  . MCHC 11/07/2014 32.1  30.0 - 36.0 g/dL Final  . RDW 11/07/2014 16.1* 11.5 - 15.5 % Final  . Platelets 11/07/2014 237  150 - 400 K/uL Final  . Neutrophils Relative % 11/07/2014 90* 43 - 77 % Final  . Neutro Abs 11/07/2014 6.4  1.7 - 7.7 K/uL Final  . Lymphocytes Relative 11/07/2014 8* 12 - 46 % Final  . Lymphs Abs 11/07/2014 0.6* 0.7 - 4.0 K/uL Final  . Monocytes Relative 11/07/2014 2* 3 - 12 % Final  . Monocytes Absolute 11/07/2014 0.1  0.1 - 1.0 K/uL Final  . Eosinophils Relative 11/07/2014 0  0 - 5 % Final  . Eosinophils Absolute 11/07/2014 0.0  0.0 - 0.7 K/uL Final  . Basophils Relative 11/07/2014 0  0 - 1 % Final  . Basophils Absolute 11/07/2014 0.0  0.0 - 0.1 K/uL Final  . Sodium 11/07/2014 140  135 - 145 mmol/L Final   Please note change in reference range.  . Potassium 11/07/2014 3.8  3.5 - 5.1 mmol/L Final   Please note change in reference range.  . Chloride 11/07/2014 104  96 - 112 mEq/L Final   . CO2 11/07/2014 26  19 - 32 mmol/L Final  . Glucose, Bld 11/07/2014 145* 70 - 99 mg/dL Final  . BUN 11/07/2014 16  6 - 23 mg/dL Final  . Creatinine, Ser 11/07/2014 1.32* 0.50 - 1.10 mg/dL Final  . Calcium 11/07/2014 9.0  8.4 - 10.5 mg/dL Final  . Total Protein 11/07/2014 7.3  6.0 - 8.3 g/dL Final  . Albumin 11/07/2014 3.5  3.5 - 5.2 g/dL Final  . AST 11/07/2014 19  0 - 37 U/L Final  . ALT 11/07/2014 16  0 - 35 U/L Final  . Alkaline Phosphatase 11/07/2014 73  39 - 117 U/L Final  . Total Bilirubin 11/07/2014 0.4  0.3 - 1.2 mg/dL Final  . GFR calc non Af Amer 11/07/2014 41* >90 mL/min   Final  . GFR calc Af Amer 11/07/2014 47* >90 mL/min Final   Comment: (NOTE) The eGFR has been calculated using the CKD EPI equation. This calculation has not been validated in all clinical situations. eGFR's persistently <90 mL/min signify possible Chronic Kidney Disease.   . Anion gap 11/07/2014 10  5 - 15 Final  . Influenza A By PCR 11/07/2014 NEGATIVE  NEGATIVE Final  . Influenza B By PCR 11/07/2014 NEGATIVE  NEGATIVE Final  . H1N1 flu by pcr 11/07/2014 NOT DETECTED  NOT DETECTED Final   Comment:        The Xpert Flu assay (FDA approved for nasal aspirates or washes and nasopharyngeal swab specimens), is intended as an aid in the diagnosis of influenza and should not be used as a sole basis for treatment.   . Glucose-Capillary 11/07/2014 164* 70 - 99 mg/dL Final  . Glucose-Capillary 11/07/2014 138* 70 - 99 mg/dL Final  . Glucose-Capillary 11/07/2014 208* 70 - 99 mg/dL Final  . Glucose-Capillary 11/07/2014 156* 70 - 99 mg/dL Final  . Specimen Description 11/07/2014 SPUTUM   Final  . Special Requests 11/07/2014 NONE   Final  . Gram Stain 11/07/2014    Final                   Value:ABUNDANT WBC PRESENT,BOTH PMN AND MONONUCLEAR NO SQUAMOUS EPITHELIAL CELLS SEEN NO ORGANISMS SEEN Performed at [facility]   . Culture 11/07/2014    Final                   Value:NORMAL OROPHARYNGEAL  FLORA Performed at [facility]   . Report Status 11/07/2014 11/11/2014 FINAL   Final  . Glucose-Capillary 11/08/2014 154* 70 - 99 mg/dL Final  . Glucose-Capillary 11/08/2014 200* 70 - 99 mg/dL Final  . Glucose-Capillary 11/08/2014 171* 70 - 99 mg/dL Final    Dg Chest Portable 1 View  11/06/2014   CLINICAL DATA:  Shortness of breath, onset earlier today.  EXAM: PORTABLE CHEST - 1 VIEW  COMPARISON:  Radiograph 12/12/2010, CT 12/09/2010  FINDINGS: Lung volumes are low. Stable cardiomegaly. There is probable retrocardiac opacity. Thickening of the paratracheal stripes with mass effect on the trachea consistent with substernal extension of goiter, as seen on previous CT. No large pleural effusion or pneumothorax.  IMPRESSION: Probable left lower lobe retrocardiac opacity, atelectasis versus pneumonia. Grossly stable cardiomegaly allowing for differences in technique.   Electronically Signed   By: Jeb Levering M.D.   On: 11/06/2014 18:25   HOSPITAL RECORDS REVIEWED  Assessment/Plan    ICD-9-CM ICD-10-CM   1. HCAP (healthcare-associated pneumonia) - improving clinically 486 J18.9   2. Other emphysema 492.8 J43.8   3. DM (diabetes mellitus) type II controlled peripheral vascular disorder 250.70 E11.59    443.81    4. Chronic diastolic CHF (congestive heart failure) 428.32 I50.32    428.0    5. Essential hypertension 401.9 I10   6.  Rectal CA s/p resection; now with colostomy   --resume qAC and qHS CBGs and record  --finish levaquin  --continue current medications as ordered  --continue colostomy care as ordered   [facility] S. Perlie Gold  [facility] [address] [place], [place] [address] [phone] Office (Wednesdays and Fridays 8 AM - 5 PM) [phone] Cell (Monday-Friday 8 AM - 5 PM)

## 2014-11-26 ENCOUNTER — Non-Acute Institutional Stay (SKILLED_NURSING_FACILITY): Payer: Medicare Other | Admitting: Adult Health

## 2014-11-26 DIAGNOSIS — E119 Type 2 diabetes mellitus without complications: Secondary | ICD-10-CM | POA: Diagnosis not present

## 2014-11-26 DIAGNOSIS — I509 Heart failure, unspecified: Secondary | ICD-10-CM | POA: Diagnosis not present

## 2014-11-26 DIAGNOSIS — E1151 Type 2 diabetes mellitus with diabetic peripheral angiopathy without gangrene: Secondary | ICD-10-CM

## 2014-11-26 DIAGNOSIS — F329 Major depressive disorder, single episode, unspecified: Secondary | ICD-10-CM | POA: Diagnosis not present

## 2014-11-26 DIAGNOSIS — E1159 Type 2 diabetes mellitus with other circulatory complications: Secondary | ICD-10-CM

## 2014-11-26 DIAGNOSIS — E785 Hyperlipidemia, unspecified: Secondary | ICD-10-CM | POA: Diagnosis not present

## 2014-11-26 DIAGNOSIS — I10 Essential (primary) hypertension: Secondary | ICD-10-CM | POA: Diagnosis not present

## 2014-11-26 DIAGNOSIS — I5032 Chronic diastolic (congestive) heart failure: Secondary | ICD-10-CM | POA: Diagnosis not present

## 2014-11-26 DIAGNOSIS — I129 Hypertensive chronic kidney disease with stage 1 through stage 4 chronic kidney disease, or unspecified chronic kidney disease: Secondary | ICD-10-CM

## 2014-11-26 DIAGNOSIS — N189 Chronic kidney disease, unspecified: Secondary | ICD-10-CM | POA: Diagnosis not present

## 2014-11-26 DIAGNOSIS — F0280 Dementia in other diseases classified elsewhere without behavioral disturbance: Secondary | ICD-10-CM | POA: Diagnosis not present

## 2014-11-26 DIAGNOSIS — I130 Hypertensive heart and chronic kidney disease with heart failure and stage 1 through stage 4 chronic kidney disease, or unspecified chronic kidney disease: Secondary | ICD-10-CM

## 2014-11-27 DIAGNOSIS — I10 Essential (primary) hypertension: Secondary | ICD-10-CM | POA: Diagnosis not present

## 2014-11-27 DIAGNOSIS — E785 Hyperlipidemia, unspecified: Secondary | ICD-10-CM | POA: Diagnosis not present

## 2014-11-27 DIAGNOSIS — F0280 Dementia in other diseases classified elsewhere without behavioral disturbance: Secondary | ICD-10-CM | POA: Diagnosis not present

## 2014-11-27 DIAGNOSIS — F329 Major depressive disorder, single episode, unspecified: Secondary | ICD-10-CM | POA: Diagnosis not present

## 2014-11-27 DIAGNOSIS — E119 Type 2 diabetes mellitus without complications: Secondary | ICD-10-CM | POA: Diagnosis not present

## 2014-11-27 DIAGNOSIS — I5032 Chronic diastolic (congestive) heart failure: Secondary | ICD-10-CM | POA: Diagnosis not present

## 2014-11-28 DIAGNOSIS — I10 Essential (primary) hypertension: Secondary | ICD-10-CM | POA: Diagnosis not present

## 2014-11-28 DIAGNOSIS — E119 Type 2 diabetes mellitus without complications: Secondary | ICD-10-CM | POA: Diagnosis not present

## 2014-11-28 DIAGNOSIS — I5032 Chronic diastolic (congestive) heart failure: Secondary | ICD-10-CM | POA: Diagnosis not present

## 2014-11-28 DIAGNOSIS — E785 Hyperlipidemia, unspecified: Secondary | ICD-10-CM | POA: Diagnosis not present

## 2014-11-28 DIAGNOSIS — F329 Major depressive disorder, single episode, unspecified: Secondary | ICD-10-CM | POA: Diagnosis not present

## 2014-11-28 DIAGNOSIS — F0280 Dementia in other diseases classified elsewhere without behavioral disturbance: Secondary | ICD-10-CM | POA: Diagnosis not present

## 2014-11-29 DIAGNOSIS — E119 Type 2 diabetes mellitus without complications: Secondary | ICD-10-CM | POA: Diagnosis not present

## 2014-11-29 DIAGNOSIS — F329 Major depressive disorder, single episode, unspecified: Secondary | ICD-10-CM | POA: Diagnosis not present

## 2014-11-29 DIAGNOSIS — I10 Essential (primary) hypertension: Secondary | ICD-10-CM | POA: Diagnosis not present

## 2014-11-29 DIAGNOSIS — F0280 Dementia in other diseases classified elsewhere without behavioral disturbance: Secondary | ICD-10-CM | POA: Diagnosis not present

## 2014-11-29 DIAGNOSIS — E785 Hyperlipidemia, unspecified: Secondary | ICD-10-CM | POA: Diagnosis not present

## 2014-11-29 DIAGNOSIS — I5032 Chronic diastolic (congestive) heart failure: Secondary | ICD-10-CM | POA: Diagnosis not present

## 2014-12-10 ENCOUNTER — Non-Acute Institutional Stay (SKILLED_NURSING_FACILITY): Payer: Medicare Other | Admitting: Adult Health

## 2014-12-10 DIAGNOSIS — F329 Major depressive disorder, single episode, unspecified: Secondary | ICD-10-CM | POA: Diagnosis not present

## 2014-12-10 DIAGNOSIS — F0151 Vascular dementia with behavioral disturbance: Secondary | ICD-10-CM

## 2014-12-10 DIAGNOSIS — I6350 Cerebral infarction due to unspecified occlusion or stenosis of unspecified cerebral artery: Secondary | ICD-10-CM

## 2014-12-10 DIAGNOSIS — I509 Heart failure, unspecified: Secondary | ICD-10-CM | POA: Diagnosis not present

## 2014-12-10 DIAGNOSIS — I5032 Chronic diastolic (congestive) heart failure: Secondary | ICD-10-CM | POA: Diagnosis not present

## 2014-12-10 DIAGNOSIS — I639 Cerebral infarction, unspecified: Secondary | ICD-10-CM | POA: Diagnosis not present

## 2014-12-10 DIAGNOSIS — K219 Gastro-esophageal reflux disease without esophagitis: Secondary | ICD-10-CM | POA: Diagnosis not present

## 2014-12-10 DIAGNOSIS — E1159 Type 2 diabetes mellitus with other circulatory complications: Secondary | ICD-10-CM

## 2014-12-10 DIAGNOSIS — I110 Hypertensive heart disease with heart failure: Secondary | ICD-10-CM

## 2014-12-10 DIAGNOSIS — E1151 Type 2 diabetes mellitus with diabetic peripheral angiopathy without gangrene: Secondary | ICD-10-CM

## 2014-12-10 DIAGNOSIS — E785 Hyperlipidemia, unspecified: Secondary | ICD-10-CM | POA: Diagnosis not present

## 2014-12-10 DIAGNOSIS — I2510 Atherosclerotic heart disease of native coronary artery without angina pectoris: Secondary | ICD-10-CM

## 2014-12-10 DIAGNOSIS — J438 Other emphysema: Secondary | ICD-10-CM

## 2014-12-10 DIAGNOSIS — F0393 Unspecified dementia, unspecified severity, with mood disturbance: Secondary | ICD-10-CM

## 2014-12-10 DIAGNOSIS — F0280 Dementia in other diseases classified elsewhere without behavioral disturbance: Secondary | ICD-10-CM

## 2014-12-10 DIAGNOSIS — F0153 Vascular dementia, unspecified severity, with mood disturbance: Secondary | ICD-10-CM

## 2014-12-17 DIAGNOSIS — I639 Cerebral infarction, unspecified: Secondary | ICD-10-CM | POA: Diagnosis not present

## 2014-12-17 DIAGNOSIS — M79674 Pain in right toe(s): Secondary | ICD-10-CM | POA: Diagnosis not present

## 2014-12-17 DIAGNOSIS — B351 Tinea unguium: Secondary | ICD-10-CM | POA: Diagnosis not present

## 2014-12-17 DIAGNOSIS — M79675 Pain in left toe(s): Secondary | ICD-10-CM | POA: Diagnosis not present

## 2014-12-17 DIAGNOSIS — E119 Type 2 diabetes mellitus without complications: Secondary | ICD-10-CM | POA: Diagnosis not present

## 2014-12-17 DIAGNOSIS — I739 Peripheral vascular disease, unspecified: Secondary | ICD-10-CM | POA: Diagnosis not present

## 2014-12-23 DIAGNOSIS — F0280 Dementia in other diseases classified elsewhere without behavioral disturbance: Secondary | ICD-10-CM | POA: Diagnosis not present

## 2014-12-23 DIAGNOSIS — J189 Pneumonia, unspecified organism: Secondary | ICD-10-CM | POA: Diagnosis not present

## 2014-12-23 DIAGNOSIS — E785 Hyperlipidemia, unspecified: Secondary | ICD-10-CM | POA: Diagnosis not present

## 2014-12-23 DIAGNOSIS — N189 Chronic kidney disease, unspecified: Secondary | ICD-10-CM | POA: Diagnosis not present

## 2014-12-23 DIAGNOSIS — I5032 Chronic diastolic (congestive) heart failure: Secondary | ICD-10-CM | POA: Diagnosis not present

## 2014-12-23 DIAGNOSIS — E049 Nontoxic goiter, unspecified: Secondary | ICD-10-CM | POA: Diagnosis not present

## 2014-12-23 DIAGNOSIS — I6789 Other cerebrovascular disease: Secondary | ICD-10-CM | POA: Diagnosis not present

## 2014-12-23 DIAGNOSIS — J449 Chronic obstructive pulmonary disease, unspecified: Secondary | ICD-10-CM | POA: Diagnosis not present

## 2014-12-23 DIAGNOSIS — K219 Gastro-esophageal reflux disease without esophagitis: Secondary | ICD-10-CM | POA: Diagnosis not present

## 2014-12-23 DIAGNOSIS — E119 Type 2 diabetes mellitus without complications: Secondary | ICD-10-CM | POA: Diagnosis not present

## 2014-12-23 DIAGNOSIS — R1312 Dysphagia, oropharyngeal phase: Secondary | ICD-10-CM | POA: Diagnosis not present

## 2014-12-23 DIAGNOSIS — F329 Major depressive disorder, single episode, unspecified: Secondary | ICD-10-CM | POA: Diagnosis not present

## 2014-12-23 DIAGNOSIS — I10 Essential (primary) hypertension: Secondary | ICD-10-CM | POA: Diagnosis not present

## 2014-12-23 DIAGNOSIS — M6281 Muscle weakness (generalized): Secondary | ICD-10-CM | POA: Diagnosis not present

## 2014-12-25 ENCOUNTER — Other Ambulatory Visit (HOSPITAL_COMMUNITY): Payer: Self-pay | Admitting: Internal Medicine

## 2014-12-25 DIAGNOSIS — F0280 Dementia in other diseases classified elsewhere without behavioral disturbance: Secondary | ICD-10-CM | POA: Diagnosis not present

## 2014-12-25 DIAGNOSIS — I10 Essential (primary) hypertension: Secondary | ICD-10-CM | POA: Diagnosis not present

## 2014-12-25 DIAGNOSIS — F329 Major depressive disorder, single episode, unspecified: Secondary | ICD-10-CM | POA: Diagnosis not present

## 2014-12-25 DIAGNOSIS — F0390 Unspecified dementia without behavioral disturbance: Secondary | ICD-10-CM

## 2014-12-25 DIAGNOSIS — E119 Type 2 diabetes mellitus without complications: Secondary | ICD-10-CM | POA: Diagnosis not present

## 2014-12-25 DIAGNOSIS — E785 Hyperlipidemia, unspecified: Secondary | ICD-10-CM | POA: Diagnosis not present

## 2014-12-25 DIAGNOSIS — I6300 Cerebral infarction due to thrombosis of unspecified precerebral artery: Secondary | ICD-10-CM

## 2014-12-25 DIAGNOSIS — I5032 Chronic diastolic (congestive) heart failure: Secondary | ICD-10-CM | POA: Diagnosis not present

## 2014-12-25 MED ORDER — GLIPIZIDE 5 MG PO TABS: 2.5000 mg | ORAL_TABLET | Freq: Every day | ORAL | Status: DC

## 2014-12-25 MED ORDER — LOSARTAN POTASSIUM 25 MG PO TABS: 25.0000 mg | ORAL_TABLET | Freq: Every day | ORAL | Status: DC

## 2014-12-25 NOTE — Progress Notes (Signed)
Patient ID: Sheila Flynn, female   DOB: Mar 17, 1946, 69 y.o.   MRN: [id number]   [name] living [place]     Allergies  Allergen Reactions  . Ace Inhibitors Swelling       Chief Complaint  Patient presents with  . Acute Visit    follow up diabetes     HPI:  Her am cbg's have been less than 60. She is taking glipizide 5 mg daily for her diabetes. Her blood pressure is elevated at 162/74; she is taking norvasc; coreg; clinidine cozaar and hydralazine to manage her disease state. She will require her blood pressure medication to be increase and her diabetes medication to be lowered. She is not voicing any concerns today.    Past Medical History  Diagnosis Date  . 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 Chronic diastolic CHF (congestive heart failure)   . CAD (coronary artery disease)     Dr. Matthew Saras  . Angina at rest   . Hyperlipidemia LDL goal < 100   . Aspiration pneumonia   . COPD (chronic obstructive pulmonary disease)   . Acute respiratory failure 12/21/10  . Pulmonary hypertension   . Chronic constipation   . Rectal cancer     s/p colostomy  . Thyroid goiter   . 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 Vascular dementia with depressed mood   . Pneumonia 12/21/10  . Delirium, induced by drug     polypharmacy  . Sacral fracture, closed     s/p kyphoplasty  . Fall     history of falls  . Dementia   . GERD (gastroesophageal reflux disease)   . Major depressive disorder   . CKD (chronic kidney disease)   . Myocardial infarction     "I've had about 3" (11/06/2014)  . 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 On home oxygen therapy     "prn at the nursing home" (11/06/2014)  . Migraine     "monthly" (11/06/2014)  . Stroke 2011    postop  . Stroke ?2014    "left side sometimes weaker since" (11/06/2014)    Past Surgical History  Procedure  Laterality Date  . Kyphoplasty      sacrum  . Colostomy      rectal ca  . Appendectomy    . Tonsillectomy    . Abdominal hysterectomy    . Tubal ligation    . Colon surgery      VITAL SIGNS BP 162/74 mmHg  Pulse 60  Ht 5\' 7"  (1.702 m)  Wt 176 lb (79.833 kg)  BMI 27.56 kg/m2  SpO2 93%   Outpatient Encounter Prescriptions as of 11/26/2014  Medication Sig  . acetaminophen (TYLENOL) 325 MG tablet Take 650 mg by mouth every 6 (six) hours as needed for mild pain.  Marland Kitchen amLODipine (NORVASC) 10 MG tablet Take 10 mg by mouth daily.  Marland Kitchen aspirin (ASPIRIN EC) 81 MG EC tablet Take 81 mg by mouth daily. Swallow whole.  Marland Kitchen atorvastatin (LIPITOR) 20 MG tablet Take 20 mg by mouth at bedtime.   . carvedilol (COREG) 3.125 MG tablet Take 3.125 mg by mouth 2 (two) times daily with a meal.  . cloNIDine (CATAPRES) 0.1 MG tablet Take 0.1 mg by mouth 2 (two) times daily.   Marland Kitchen docusate sodium (COLACE) 100 MG capsule Take 100 mg by mouth 3 (three) times daily.  Marland Kitchen glipiZIDE (GLUCOTROL) 5 MG  tablet Take by mouth daily before breakfast.  . guaiFENesin (MUCINEX) 600 MG 12 hr tablet Take 1 tablet (600 mg total) by mouth 2 (two) times daily.  . hydrALAZINE (APRESOLINE) 25 MG tablet Take 25 mg by mouth 4 (four) times daily.   Marland Kitchen ipratropium-albuterol (DUONEB) 0.5-2.5 (3) MG/3ML SOLN Take 3 mLs by nebulization every 6 (six) hours as needed (shortness of breathe/wheezing).  . isosorbide mononitrate (ISMO,MONOKET) 20 MG tablet Take 60 mg by mouth daily.   Marland Kitchen levofloxacin (LEVAQUIN) 750 MG tablet Take 1 tablet (750 mg total) by mouth every other day.  . losartan (COZAAR) 25 MG tablet Take 12.5 mg by mouth daily.  . nitroGLYCERIN (NITROSTAT) 0.4 MG SL tablet Place 0.4 mg under the tongue every 5 (five) minutes as needed for chest pain.  Marland Kitchen omeprazole (PRILOSEC) 20 MG capsule Take 20 mg by mouth daily.  Marland Kitchen PARoxetine (PAXIL) 20 MG tablet Take 30 mg by mouth daily.   . polyvinyl alcohol (LIQUIFILM TEARS) 1.4 % ophthalmic  solution Place 2 drops into both eyes 3 (three) times daily as needed for dry eyes.  . predniSONE (DELTASONE) 20 MG tablet Take 1 tablet (20 mg total) by mouth daily with breakfast.  . ranitidine (ZANTAC) 150 MG capsule Take 150 mg by mouth every evening.  . sennosides-docusate sodium (SENOKOT-S) 8.6-50 MG tablet Take 2 tablets by mouth every evening.   . traMADol (ULTRAM) 50 MG tablet Take one tablet by mouth every 8 hours as needed for pain  . Vitamin D, Ergocalciferol, (DRISDOL) 50000 UNITS CAPS capsule Take 50,000 Units by mouth every 30 (thirty) days.     SIGNIFICANT DIAGNOSTIC EXAMS  11-06-14: chest x-ray: Probable left lower lobe retrocardiac opacity, atelectasis versuspneumonia. Grossly stable cardiomegaly allowing for differences in technique.   LABS REVIEWED:   07-02-14: wbc 5.7; hgb 11.7; hct 35.0; mcv 77.3; plt 281; glucose 37; bun 23; creat 1.48; k+3.6 ;na++141; liver normal albumin 4.1; tsh 0.487 08-13-14: chol 148; ldl 93; trig 104; hgb a1c 7.1; urine micro-albumin 41.5  11-06-14: wbc 7.3; hgb 12.1; hct 38.5; mcv 81.9; plt 227; glucose 108; bun 15; creat 1.42; k+3.3; na++142 11-12-14: wbc 5.5; hgb 11.5; hct 36.4; mcv 81.4; plt 237; glucose 50; bun 21; creat 1.4; k+4.2; na++140; liver normal albumin 3.5; tsh 0.487; hgb a1c 6.1         ROS Constitutional: Negative for malaise/fatigue.  Respiratory: Negative for cough and shortness of breath.   Cardiovascular: Negative for chest pain, palpitations and leg swelling.  Gastrointestinal: Negative for heartburn, abdominal pain and constipation.  Musculoskeletal: Negative for myalgias and joint pain.  Skin: Negative.   Neurological: Negative for headaches.  Psychiatric/Behavioral: Negative for depression. The patient is not nervous/anxious.      Physical Exam Constitutional: She appears well-developed and well-nourished. No distress.  Neck: Neck supple. No JVD present. No thyromegaly present.  Cardiovascular: Normal rate,  regular rhythm and intact distal pulses.   Respiratory: Effort normal and breath sounds normal. No respiratory distress.  GI: Soft. Bowel sounds are normal. She exhibits no distension. There is no tenderness.  Colostomy   Musculoskeletal:  Is able to move all extremities  No edema   Neurological: She is alert.  Skin: Skin is warm and dry. She is not diaphoretic.     ASSESSMENT/ PLAN:  1. Diabetes; her hgb a1c is 6.1; she has been low cbg's in the am; will lower her glipizide 2.5 mg daily will continue to check cbg ac and hs will monitor  2. Hypertension:  is worse; will increase her cozaar to 25 mg daily; will monitor her status.     Ok Edwards NP [facility]  Contact [phone] Monday through Friday 8am- 5pm  After hours call [phone]

## 2014-12-26 DIAGNOSIS — I5032 Chronic diastolic (congestive) heart failure: Secondary | ICD-10-CM | POA: Diagnosis not present

## 2014-12-26 DIAGNOSIS — E785 Hyperlipidemia, unspecified: Secondary | ICD-10-CM | POA: Diagnosis not present

## 2014-12-26 DIAGNOSIS — F329 Major depressive disorder, single episode, unspecified: Secondary | ICD-10-CM | POA: Diagnosis not present

## 2014-12-26 DIAGNOSIS — E119 Type 2 diabetes mellitus without complications: Secondary | ICD-10-CM | POA: Diagnosis not present

## 2014-12-26 DIAGNOSIS — F0280 Dementia in other diseases classified elsewhere without behavioral disturbance: Secondary | ICD-10-CM | POA: Diagnosis not present

## 2014-12-26 DIAGNOSIS — I10 Essential (primary) hypertension: Secondary | ICD-10-CM | POA: Diagnosis not present

## 2014-12-27 ENCOUNTER — Ambulatory Visit (HOSPITAL_COMMUNITY)
Admission: RE | Admit: 2014-12-27 | Discharge: 2014-12-27 | Disposition: A | Discharge status: Home / Self Care | Payer: Medicare Other | Source: Ambulatory Visit | Attending: Internal Medicine | Admitting: Internal Medicine

## 2014-12-27 DIAGNOSIS — E1151 Type 2 diabetes mellitus with diabetic peripheral angiopathy without gangrene: Secondary | ICD-10-CM | POA: Insufficient documentation

## 2014-12-27 DIAGNOSIS — Z8673 Personal history of transient ischemic attack (TIA), and cerebral infarction without residual deficits: Secondary | ICD-10-CM | POA: Insufficient documentation

## 2014-12-27 DIAGNOSIS — Z9981 Dependence on supplemental oxygen: Secondary | ICD-10-CM | POA: Diagnosis not present

## 2014-12-27 DIAGNOSIS — R1312 Dysphagia, oropharyngeal phase: Secondary | ICD-10-CM | POA: Diagnosis not present

## 2014-12-27 DIAGNOSIS — J449 Chronic obstructive pulmonary disease, unspecified: Secondary | ICD-10-CM | POA: Diagnosis not present

## 2014-12-27 DIAGNOSIS — R0989 Other specified symptoms and signs involving the circulatory and respiratory systems: Secondary | ICD-10-CM | POA: Diagnosis not present

## 2014-12-27 DIAGNOSIS — N189 Chronic kidney disease, unspecified: Secondary | ICD-10-CM | POA: Diagnosis not present

## 2014-12-27 DIAGNOSIS — I130 Hypertensive heart and chronic kidney disease with heart failure and stage 1 through stage 4 chronic kidney disease, or unspecified chronic kidney disease: Secondary | ICD-10-CM | POA: Diagnosis not present

## 2014-12-27 DIAGNOSIS — I2510 Atherosclerotic heart disease of native coronary artery without angina pectoris: Secondary | ICD-10-CM | POA: Insufficient documentation

## 2014-12-27 DIAGNOSIS — I5032 Chronic diastolic (congestive) heart failure: Secondary | ICD-10-CM | POA: Diagnosis not present

## 2014-12-27 DIAGNOSIS — K219 Gastro-esophageal reflux disease without esophagitis: Secondary | ICD-10-CM | POA: Diagnosis not present

## 2014-12-27 DIAGNOSIS — R1310 Dysphagia, unspecified: Secondary | ICD-10-CM | POA: Diagnosis not present

## 2014-12-27 DIAGNOSIS — I639 Cerebral infarction, unspecified: Secondary | ICD-10-CM | POA: Diagnosis not present

## 2014-12-27 DIAGNOSIS — F0390 Unspecified dementia without behavioral disturbance: Secondary | ICD-10-CM

## 2014-12-27 DIAGNOSIS — F0280 Dementia in other diseases classified elsewhere without behavioral disturbance: Secondary | ICD-10-CM | POA: Diagnosis not present

## 2014-12-27 DIAGNOSIS — E785 Hyperlipidemia, unspecified: Secondary | ICD-10-CM | POA: Diagnosis not present

## 2014-12-27 DIAGNOSIS — E119 Type 2 diabetes mellitus without complications: Secondary | ICD-10-CM | POA: Diagnosis not present

## 2014-12-27 DIAGNOSIS — I6300 Cerebral infarction due to thrombosis of unspecified precerebral artery: Secondary | ICD-10-CM

## 2014-12-27 DIAGNOSIS — I10 Essential (primary) hypertension: Secondary | ICD-10-CM | POA: Diagnosis not present

## 2014-12-27 DIAGNOSIS — F329 Major depressive disorder, single episode, unspecified: Secondary | ICD-10-CM | POA: Diagnosis not present

## 2014-12-27 NOTE — Procedures (Signed)
Objective Swallowing Evaluation: Modified Barium Swallowing Study  Patient Details  Name: Sheila Flynn MRN: [id number] Date of Birth: 1946-08-08  Today's Date: 12/27/2014 Time: SLP Start Time (ACUTE ONLY): 1125-SLP Stop Time (ACUTE ONLY): 1140 SLP Time Calculation (min) (ACUTE ONLY): 15 min  Past Medical History:  Past Medical History  Diagnosis Date  . 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 Chronic diastolic CHF (congestive heart failure)   . CAD (coronary artery disease)     Dr. Matthew Saras  . Angina at rest   . Hyperlipidemia LDL goal < 100   . Aspiration pneumonia   . COPD (chronic obstructive pulmonary disease)   . Acute respiratory failure 12/21/10  . Pulmonary hypertension   . Chronic constipation   . Rectal cancer     s/p colostomy  . Thyroid goiter   . 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 Vascular dementia with depressed mood   . Pneumonia 12/21/10  . Delirium, induced by drug     polypharmacy  . Sacral fracture, closed     s/p kyphoplasty  . Fall     history of falls  . Dementia   . GERD (gastroesophageal reflux disease)   . Major depressive disorder   . CKD (chronic kidney disease)   . Myocardial infarction     "I've had about 3" (11/06/2014)  . 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 On home oxygen therapy     "prn at the nursing home" (11/06/2014)  . Migraine     "monthly" (11/06/2014)  . Stroke 2011    postop  . Stroke ?2014    "left side sometimes weaker since" (11/06/2014)   Past Surgical History:  Past Surgical History  Procedure Laterality Date  . Kyphoplasty      sacrum  . Colostomy      rectal ca  . Appendectomy    . Tonsillectomy    . Abdominal hysterectomy    . Tubal ligation    . Colon surgery     HPI:  HPI: 69 year old female with PMH of dementia, HTN, CVA, DM 2, GERD, CKD referred for OP MBS due to recent need for diet  downgrade from regular to pureed solids due to inability to vocalize immediate after the swallow with cued weak cough attempt unsuccessful to clear the airway and slight drop in O2 post meals. Nursing also reports coughing/gagging with po intake.   No Data Recorded  Assessment / Plan / Recommendation CHL IP CLINICAL IMPRESSIONS 12/27/2014  Dysphagia Diagnosis Mild oral phase dysphagia;Mild pharyngeal phase dysphagia  Clinical impression Patient presents with a mild oropharyngeal dysphagia characterized by decreased bolus cohesion and delayed oral transit with minimal tongue pumping, followed by a delay in swallow initiation resulting in flash penetration of thin liquids. Otherwise, patient able to fully protect the airway although cannot f/o decreased airway protection with textured solids given delay to the pyriform sinuses. Did not observe changes in ability to vocalize post swallow as reported by primary SLP during this study and although no esophageal residuals or backflow of bolus noted, question if there is an esophageal component to this symptom.       CHL IP TREATMENT RECOMMENDATION 12/27/2014  Treatment Plan Recommendations Defer treatment plan to SLP at (Comment)     CHL IP DIET RECOMMENDATION 12/27/2014  Diet Recommendations Dysphagia 3 (Mechanical Soft);Thin liquid  Liquid Administration via Cup;Straw  Medication Administration Whole meds  with puree  Compensations Slow rate;Small sips/bites  Postural Changes and/or Swallow Maneuvers Seated upright 90 degrees;Upright 30-60 min after meal     CHL IP OTHER RECOMMENDATIONS 12/27/2014  Recommended Consults (None)  Oral Care Recommendations Oral care BID  Other Recommendations (None)     CHL IP FOLLOW UP RECOMMENDATIONS 12/27/2014  Follow up Recommendations Skilled Nursing facility     No flowsheet data found.       SLP Swallow Goals No flowsheet data found.  No flowsheet data found.    CHL IP REASON FOR REFERRAL 12/27/2014   Reason for Referral Objectively evaluate swallowing function     CHL IP ORAL PHASE 12/27/2014  Lips (None)  Tongue (None)  Mucous membranes (None)  Nutritional status (None)  Other (None)  Oxygen therapy (None)  Oral Phase Impaired  Oral - Pudding Teaspoon (None)  Oral - Pudding Cup (None)  Oral - Honey Teaspoon (None)  Oral - Honey Cup (None)  Oral - Honey Syringe (None)  Oral - Nectar Teaspoon (None)  Oral - Nectar Cup (None)  Oral - Nectar Straw (None)  Oral - Nectar Syringe (None)  Oral - Ice Chips (None)  Oral - Thin Teaspoon (None)  Oral - Thin Cup Lingual pumping;Delayed oral transit  Oral - Thin Straw Lingual pumping;Delayed oral transit  Oral - Thin Syringe (None)  Oral - Puree Delayed oral transit  Oral - Mechanical Soft Other (Comment)  Oral - Regular (None)  Oral - Multi-consistency (None)  Oral - Pill WFL  Oral Phase - Comment (None)      CHL IP PHARYNGEAL PHASE 12/27/2014  Pharyngeal Phase Impaired  Pharyngeal - Pudding Teaspoon (None)  Penetration/Aspiration details (pudding teaspoon) (None)  Pharyngeal - Pudding Cup (None)  Penetration/Aspiration details (pudding cup) (None)  Pharyngeal - Honey Teaspoon (None)  Penetration/Aspiration details (honey teaspoon) (None)  Pharyngeal - Honey Cup (None)  Penetration/Aspiration details (honey cup) (None)  Pharyngeal - Honey Syringe (None)  Penetration/Aspiration details (honey syringe) (None)  Pharyngeal - Nectar Teaspoon (None)  Penetration/Aspiration details (nectar teaspoon) (None)  Pharyngeal - Nectar Cup (None)  Penetration/Aspiration details (nectar cup) (None)  Pharyngeal - Nectar Straw (None)  Penetration/Aspiration details (nectar straw) (None)  Pharyngeal - Nectar Syringe (None)  Penetration/Aspiration details (nectar syringe) (None)  Pharyngeal - Ice Chips (None)  Penetration/Aspiration details (ice chips) (None)  Pharyngeal - Thin Teaspoon (None)  Penetration/Aspiration details (thin teaspoon)  (None)  Pharyngeal - Thin Cup Delayed swallow initiation;Premature spillage to pyriform sinuses;Penetration/Aspiration before swallow  Penetration/Aspiration details (thin cup) Material enters airway, remains ABOVE vocal cords then ejected out  Pharyngeal - Thin Straw Delayed swallow initiation;Premature spillage to pyriform sinuses;Penetration/Aspiration before swallow  Penetration/Aspiration details (thin straw) Material enters airway, remains ABOVE vocal cords then ejected out  Pharyngeal - Thin Syringe (None)  Penetration/Aspiration details (thin syringe') (None)  Pharyngeal - Puree Delayed swallow initiation;Premature spillage to valleculae  Penetration/Aspiration details (puree) (None)  Pharyngeal - Mechanical Soft Delayed swallow initiation;Premature spillage to valleculae  Penetration/Aspiration details (mechanical soft) (None)  Pharyngeal - Regular (None)  Penetration/Aspiration details (regular) (None)  Pharyngeal - Multi-consistency (None)  Penetration/Aspiration details (multi-consistency) (None)  Pharyngeal - Pill Delayed swallow initiation;Premature spillage to valleculae  Penetration/Aspiration details (pill) (None)  Pharyngeal Comment (None)     CHL IP CERVICAL ESOPHAGEAL PHASE 12/27/2014  Cervical Esophageal Phase WFL  Pudding Teaspoon (None)  Pudding Cup (None)  Honey Teaspoon (None)  Honey Cup (None)  Honey Syringe (None)  Nectar Teaspoon (None)  Nectar Cup (None)  Nectar Straw (None)  Nectar Syringe (None)  Thin Teaspoon (None)  Thin Cup (None)  Thin Straw (None)  Thin Syringe (None)  Cervical Esophageal Comment (None)    CHL IP GO 12/27/2014  Functional Assessment Tool Used skilled clinical judgement  Functional Limitations Swallowing  Swallow Current Status (I3382) CI  Swallow Goal Status (N0539) CI  Swallow Discharge Status (J6734) CI  Motor Speech Current Status (L9379) (None)  Motor Speech Goal Status (K2409) (None)  Motor Speech Goal Status (B3532)  (None)  Spoken Language Comprehension Current Status (D9242) (None)  Spoken Language Comprehension Goal Status (A8341) (None)  Spoken Language Comprehension Discharge Status [phone]) (None)  Spoken Language Expression Current Status [phone]) (None)  Spoken Language Expression Goal Status (Q1194) (None)  Spoken Language Expression Discharge Status [phone]) (None)  Attention Current Status (X4481) (None)  Attention Goal Status (E5631) (None)  Attention Discharge Status (S9702) (None)  Memory Current Status (O3785) (None)  Memory Goal Status (Y8502) (None)  Memory Discharge Status (D7412) (None)  Voice Current Status (I7867) (None)  Voice Goal Status (E7209) (None)  Voice Discharge Status (O7096) (None)  Other Speech-Language Pathology Functional Limitation [phone]) (None)  Other Speech-Language Pathology Functional Limitation Goal Status (Q9476) (None)  Other Speech-Language Pathology Functional Limitation Discharge Status [phone]) (None)   Gabriel Rainwater MA, CCC-SLP [phone]         Sheila Flynn Meryl 12/27/2014, 12:13 PM

## 2014-12-28 DIAGNOSIS — F0280 Dementia in other diseases classified elsewhere without behavioral disturbance: Secondary | ICD-10-CM | POA: Diagnosis not present

## 2014-12-28 DIAGNOSIS — E119 Type 2 diabetes mellitus without complications: Secondary | ICD-10-CM | POA: Diagnosis not present

## 2014-12-28 DIAGNOSIS — F329 Major depressive disorder, single episode, unspecified: Secondary | ICD-10-CM | POA: Diagnosis not present

## 2014-12-28 DIAGNOSIS — I10 Essential (primary) hypertension: Secondary | ICD-10-CM | POA: Diagnosis not present

## 2014-12-28 DIAGNOSIS — E785 Hyperlipidemia, unspecified: Secondary | ICD-10-CM | POA: Diagnosis not present

## 2014-12-28 DIAGNOSIS — I5032 Chronic diastolic (congestive) heart failure: Secondary | ICD-10-CM | POA: Diagnosis not present

## 2014-12-29 ENCOUNTER — Encounter: Payer: Self-pay | Admitting: Adult Health

## 2014-12-29 NOTE — Progress Notes (Signed)
Patient ID: Sheila Flynn, female   DOB: 1945-12-01, 69 y.o.   MRN: [id number]  Sheila Flynn living [place]     Allergies  Allergen Reactions  . Ace Inhibitors Swelling       Chief Complaint  Patient presents with  . Medical Management of Chronic Issues    HPI:    Past Medical History  Diagnosis Date  . 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 Chronic diastolic CHF (congestive heart failure)   . CAD (coronary artery disease)     Dr. Matthew Saras  . Angina at rest   . Hyperlipidemia LDL goal < 100   . Aspiration pneumonia   . COPD (chronic obstructive pulmonary disease)   . Acute respiratory failure 12/21/10  . Pulmonary hypertension   . Chronic constipation   . Rectal cancer     s/p colostomy  . Thyroid goiter   . 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 Vascular dementia with depressed mood   . Pneumonia 12/21/10  . Delirium, induced by drug     polypharmacy  . Sacral fracture, closed     s/p kyphoplasty  . Fall     history of falls  . Dementia   . GERD (gastroesophageal reflux disease)   . Major depressive disorder   . CKD (chronic kidney disease)   . Myocardial infarction     "I've had about 3" (11/06/2014)  . 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 On home oxygen therapy     "prn at the nursing home" (11/06/2014)  . Migraine     "monthly" (11/06/2014)  . Stroke 2011    postop  . Stroke ?2014    "left side sometimes weaker since" (11/06/2014)    Past Surgical History  Procedure Laterality Date  . Kyphoplasty      sacrum  . Colostomy      rectal ca  . Appendectomy    . Tonsillectomy    . Abdominal hysterectomy    . Tubal ligation    . Colon surgery      VITAL SIGNS BP 158/78 mmHg  Pulse 72  Ht 5\' 7"  (1.702 m)  Wt 177 lb (80.287 kg)  BMI 27.72 kg/m2  SpO2 93%   Outpatient Encounter Prescriptions as of 12/10/2014  Medication Sig  .  acetaminophen (TYLENOL) 325 MG tablet Take 650 mg by mouth every 6 (six) hours as needed for mild pain.  Marland Kitchen amLODipine (NORVASC) 10 MG tablet Take 10 mg by mouth daily.  Marland Kitchen aspirin (ASPIRIN EC) 81 MG EC tablet Take 81 mg by mouth daily. Swallow whole.  Marland Kitchen atorvastatin (LIPITOR) 20 MG tablet Take 20 mg by mouth at bedtime.   . carvedilol (COREG) 3.125 MG tablet Take 3.125 mg by mouth 2 (two) times daily with a meal.  . cloNIDine (CATAPRES) 0.1 MG tablet Take 0.1 mg by mouth 2 (two) times daily.   Marland Kitchen docusate sodium (COLACE) 100 MG capsule Take 100 mg by mouth 3 (three) times daily.  Marland Kitchen glipiZIDE (GLUCOTROL) 5 MG tablet Take 0.5 tablets (2.5 mg total) by mouth daily before breakfast.  . hydrALAZINE (APRESOLINE) 25 MG tablet Take 10 mg by mouth 4 (four) times daily.   Marland Kitchen ipratropium-albuterol (DUONEB) 0.5-2.5 (3) MG/3ML SOLN Take 3 mLs by nebulization every 6 (six) hours as needed (shortness of breathe/wheezing).  . isosorbide mononitrate (ISMO,MONOKET) 20 MG tablet Take 60 mg by mouth daily.   Marland Kitchen losartan (  COZAAR) 25 MG tablet Take 1 tablet (25 mg total) by mouth daily.  . nitroGLYCERIN (NITROSTAT) 0.4 MG SL tablet Place 0.4 mg under the tongue every 5 (five) minutes as needed for chest pain.  Marland Kitchen omeprazole (PRILOSEC) 20 MG capsule Take 20 mg by mouth daily.  Marland Kitchen PARoxetine (PAXIL) 20 MG tablet Take 30 mg by mouth daily.   . polyvinyl alcohol (LIQUIFILM TEARS) 1.4 % ophthalmic solution Place 2 drops into both eyes 3 (three) times daily as needed for dry eyes.  . predniSONE (DELTASONE) 20 MG tablet Take 1 tablet (20 mg total) by mouth daily with breakfast.  . ranitidine (ZANTAC) 150 MG capsule Take 150 mg by mouth every evening.  . sennosides-docusate sodium (SENOKOT-S) 8.6-50 MG tablet Take 2 tablets by mouth every evening.   . traMADol (ULTRAM) 50 MG tablet Take one tablet by mouth every 8 hours as needed for pain  . Vitamin D, Ergocalciferol, (DRISDOL) 50000 UNITS CAPS capsule Take 50,000 Units by mouth  every 30 (thirty) days.  . [DISCONTINUED] guaiFENesin (MUCINEX) 600 MG 12 hr tablet Take 1 tablet (600 mg total) by mouth 2 (two) times daily. (Patient not taking: Reported on 12/29/2014)  . [DISCONTINUED] levofloxacin (LEVAQUIN) 750 MG tablet Take 1 tablet (750 mg total) by mouth every other day. (Patient not taking: Reported on 12/29/2014)     SIGNIFICANT DIAGNOSTIC EXAMS   11-06-14: chest x-ray: Probable left lower lobe retrocardiac opacity, atelectasis versuspneumonia. Grossly stable cardiomegaly allowing for differences in technique.   LABS REVIEWED:   07-02-14: wbc 5.7; hgb 11.7; hct 35.0; mcv 77.3; plt 281; glucose 37; bun 23; creat 1.48; k+3.6 ;na++141; liver normal albumin 4.1; tsh 0.487 08-13-14: chol 148; ldl 93; trig 104; hgb a1c 7.1; urine micro-albumin 41.5  10-31-14: glucose 100 bun 14; creat 1.38; k+3.7; na++140; vit d 31  11-06-14: wbc 7.3; hgb 12.1; hct 38.5; mcv 81.9; plt 227; glucose 108; bun 15; creat 1.42; k+3.3; na++142 11-12-14: wbc 5.5; hgb 11.5; hct 36.4; mcv 81.4; plt 237; glucose 50; bun 21; creat 1.4; k+4.2; na++140; liver normal albumin 3.5; tsh 0.487; hgb a1c 6.1       ROS Constitutional: Negative for malaise/fatigue.  Respiratory: Negative for cough and shortness of breath.   Cardiovascular: Negative for chest pain, palpitations and leg swelling.  Gastrointestinal: Negative for heartburn, abdominal pain and constipation.  Musculoskeletal: Negative for myalgias and joint pain.  Skin: Negative.   Neurological: Negative for headaches.  Psychiatric/Behavioral: Negative for depression. The patient is not nervous/anxious.      Physical Exam Constitutional: She appears well-developed and well-nourished. No distress.  Neck: Neck supple. No JVD present. No thyromegaly present.  Cardiovascular: Normal rate, regular rhythm and intact distal pulses.   Respiratory: Effort normal and breath sounds normal. No respiratory distress.  GI: Soft. Bowel sounds are  normal. She exhibits no distension. There is no tenderness.  Colostomy   Musculoskeletal:  Is able to move all extremities  No edema   Neurological: She is alert.  Skin: Skin is warm and dry. She is not diaphoretic.       ASSESSMENT/ PLAN:  1. Hypertension: is not being controlled: will continue norvasc 10 mg daily; cozaar 25 mg daily; hydralazine 10 mg four times daily coreg 3.125 mg twice daily and will increase her clonidine to 0.1 mg three times daily and will have nursing check her blood pressure every shift   2. Dyslipidemia: will continue lipitor 20 mg daily ldl is 95  3. Diabetes: is presently stable will  continue glipizide 2.5 mg daily hgb a1c is 6.1  4. COPD: is stable will continue duoneb every 6 hours as needed; will continue prednisone 10 mg daily and will monitor his status.   5. CAD: no complaints of chest pain present will continue imdur 60 mg daily; asa 81 mg daily and prn ntg; will monitor  6. Gerd: will continue zantac 150 mg nightly and prilosec 20 mg daily   7. Constipation: will continue colace three times daily; and senna s 2 tabs daily   8. Depression: will continue paxil 30 mg daily   9.  CVA: is neurologically stable; will continue asa 81 mg daily   10. Vascular dementia: on change in her status; is presently not on medications; will not make changes will monitor her status.   11. Diastolic heart failure: will continue coreg 3.125 mg twice daily; hydralazine 10 mg four times daily; imdur 60 mg daily and will monitor her status.      Ok Edwards NP [facility]  Contact [phone] Monday through Friday 8am- 5pm  After hours call [phone]

## 2014-12-31 DIAGNOSIS — I10 Essential (primary) hypertension: Secondary | ICD-10-CM | POA: Diagnosis not present

## 2014-12-31 DIAGNOSIS — E119 Type 2 diabetes mellitus without complications: Secondary | ICD-10-CM | POA: Diagnosis not present

## 2014-12-31 DIAGNOSIS — F329 Major depressive disorder, single episode, unspecified: Secondary | ICD-10-CM | POA: Diagnosis not present

## 2014-12-31 DIAGNOSIS — I5032 Chronic diastolic (congestive) heart failure: Secondary | ICD-10-CM | POA: Diagnosis not present

## 2014-12-31 DIAGNOSIS — E785 Hyperlipidemia, unspecified: Secondary | ICD-10-CM | POA: Diagnosis not present

## 2014-12-31 DIAGNOSIS — F0280 Dementia in other diseases classified elsewhere without behavioral disturbance: Secondary | ICD-10-CM | POA: Diagnosis not present

## 2015-01-01 DIAGNOSIS — I5032 Chronic diastolic (congestive) heart failure: Secondary | ICD-10-CM | POA: Diagnosis not present

## 2015-01-01 DIAGNOSIS — E785 Hyperlipidemia, unspecified: Secondary | ICD-10-CM | POA: Diagnosis not present

## 2015-01-01 DIAGNOSIS — F329 Major depressive disorder, single episode, unspecified: Secondary | ICD-10-CM | POA: Diagnosis not present

## 2015-01-01 DIAGNOSIS — E119 Type 2 diabetes mellitus without complications: Secondary | ICD-10-CM | POA: Diagnosis not present

## 2015-01-01 DIAGNOSIS — F0280 Dementia in other diseases classified elsewhere without behavioral disturbance: Secondary | ICD-10-CM | POA: Diagnosis not present

## 2015-01-01 DIAGNOSIS — I10 Essential (primary) hypertension: Secondary | ICD-10-CM | POA: Diagnosis not present

## 2015-01-02 DIAGNOSIS — E119 Type 2 diabetes mellitus without complications: Secondary | ICD-10-CM | POA: Diagnosis not present

## 2015-01-02 DIAGNOSIS — I5032 Chronic diastolic (congestive) heart failure: Secondary | ICD-10-CM | POA: Diagnosis not present

## 2015-01-02 DIAGNOSIS — F329 Major depressive disorder, single episode, unspecified: Secondary | ICD-10-CM | POA: Diagnosis not present

## 2015-01-02 DIAGNOSIS — F0280 Dementia in other diseases classified elsewhere without behavioral disturbance: Secondary | ICD-10-CM | POA: Diagnosis not present

## 2015-01-02 DIAGNOSIS — I10 Essential (primary) hypertension: Secondary | ICD-10-CM | POA: Diagnosis not present

## 2015-01-02 DIAGNOSIS — E785 Hyperlipidemia, unspecified: Secondary | ICD-10-CM | POA: Diagnosis not present

## 2015-01-03 DIAGNOSIS — E119 Type 2 diabetes mellitus without complications: Secondary | ICD-10-CM | POA: Diagnosis not present

## 2015-01-03 DIAGNOSIS — E785 Hyperlipidemia, unspecified: Secondary | ICD-10-CM | POA: Diagnosis not present

## 2015-01-03 DIAGNOSIS — I10 Essential (primary) hypertension: Secondary | ICD-10-CM | POA: Diagnosis not present

## 2015-01-03 DIAGNOSIS — I5032 Chronic diastolic (congestive) heart failure: Secondary | ICD-10-CM | POA: Diagnosis not present

## 2015-01-03 DIAGNOSIS — F0280 Dementia in other diseases classified elsewhere without behavioral disturbance: Secondary | ICD-10-CM | POA: Diagnosis not present

## 2015-01-03 DIAGNOSIS — F329 Major depressive disorder, single episode, unspecified: Secondary | ICD-10-CM | POA: Diagnosis not present

## 2015-01-04 DIAGNOSIS — N2581 Secondary hyperparathyroidism of renal origin: Secondary | ICD-10-CM | POA: Diagnosis not present

## 2015-01-04 DIAGNOSIS — I5032 Chronic diastolic (congestive) heart failure: Secondary | ICD-10-CM | POA: Diagnosis not present

## 2015-01-04 DIAGNOSIS — I129 Hypertensive chronic kidney disease with stage 1 through stage 4 chronic kidney disease, or unspecified chronic kidney disease: Secondary | ICD-10-CM | POA: Diagnosis not present

## 2015-01-04 DIAGNOSIS — F0280 Dementia in other diseases classified elsewhere without behavioral disturbance: Secondary | ICD-10-CM | POA: Diagnosis not present

## 2015-01-04 DIAGNOSIS — N189 Chronic kidney disease, unspecified: Secondary | ICD-10-CM | POA: Diagnosis not present

## 2015-01-04 DIAGNOSIS — D631 Anemia in chronic kidney disease: Secondary | ICD-10-CM | POA: Diagnosis not present

## 2015-01-04 DIAGNOSIS — E785 Hyperlipidemia, unspecified: Secondary | ICD-10-CM | POA: Diagnosis not present

## 2015-01-04 DIAGNOSIS — F329 Major depressive disorder, single episode, unspecified: Secondary | ICD-10-CM | POA: Diagnosis not present

## 2015-01-04 DIAGNOSIS — E119 Type 2 diabetes mellitus without complications: Secondary | ICD-10-CM | POA: Diagnosis not present

## 2015-01-04 DIAGNOSIS — I10 Essential (primary) hypertension: Secondary | ICD-10-CM | POA: Diagnosis not present

## 2015-01-04 DIAGNOSIS — N183 Chronic kidney disease, stage 3 (moderate): Secondary | ICD-10-CM | POA: Diagnosis not present

## 2015-01-07 ENCOUNTER — Emergency Department (HOSPITAL_COMMUNITY): Payer: Medicare Other

## 2015-01-07 ENCOUNTER — Encounter (HOSPITAL_COMMUNITY): Payer: Self-pay

## 2015-01-07 ENCOUNTER — Emergency Department (HOSPITAL_COMMUNITY)
Admission: EM | Admit: 2015-01-07 | Discharge: 2015-01-07 | Disposition: A | Discharge status: Home / Self Care | Payer: Medicare Other | Attending: Emergency Medicine | Admitting: Emergency Medicine

## 2015-01-07 DIAGNOSIS — Z9181 History of falling: Secondary | ICD-10-CM | POA: Insufficient documentation

## 2015-01-07 DIAGNOSIS — R1030 Lower abdominal pain, unspecified: Secondary | ICD-10-CM | POA: Diagnosis not present

## 2015-01-07 DIAGNOSIS — I129 Hypertensive chronic kidney disease with stage 1 through stage 4 chronic kidney disease, or unspecified chronic kidney disease: Secondary | ICD-10-CM | POA: Diagnosis not present

## 2015-01-07 DIAGNOSIS — Z7982 Long term (current) use of aspirin: Secondary | ICD-10-CM | POA: Insufficient documentation

## 2015-01-07 DIAGNOSIS — Z8673 Personal history of transient ischemic attack (TIA), and cerebral infarction without residual deficits: Secondary | ICD-10-CM | POA: Insufficient documentation

## 2015-01-07 DIAGNOSIS — Z8701 Personal history of pneumonia (recurrent): Secondary | ICD-10-CM | POA: Insufficient documentation

## 2015-01-07 DIAGNOSIS — R109 Unspecified abdominal pain: Secondary | ICD-10-CM | POA: Diagnosis present

## 2015-01-07 DIAGNOSIS — Z85048 Personal history of other malignant neoplasm of rectum, rectosigmoid junction, and anus: Secondary | ICD-10-CM | POA: Insufficient documentation

## 2015-01-07 DIAGNOSIS — I5032 Chronic diastolic (congestive) heart failure: Secondary | ICD-10-CM | POA: Insufficient documentation

## 2015-01-07 DIAGNOSIS — N2889 Other specified disorders of kidney and ureter: Secondary | ICD-10-CM | POA: Insufficient documentation

## 2015-01-07 DIAGNOSIS — R1032 Left lower quadrant pain: Secondary | ICD-10-CM | POA: Diagnosis not present

## 2015-01-07 DIAGNOSIS — J449 Chronic obstructive pulmonary disease, unspecified: Secondary | ICD-10-CM | POA: Insufficient documentation

## 2015-01-07 DIAGNOSIS — N189 Chronic kidney disease, unspecified: Secondary | ICD-10-CM | POA: Insufficient documentation

## 2015-01-07 DIAGNOSIS — I252 Old myocardial infarction: Secondary | ICD-10-CM | POA: Insufficient documentation

## 2015-01-07 DIAGNOSIS — Z8781 Personal history of (healed) traumatic fracture: Secondary | ICD-10-CM | POA: Diagnosis not present

## 2015-01-07 DIAGNOSIS — B9689 Other specified bacterial agents as the cause of diseases classified elsewhere: Secondary | ICD-10-CM | POA: Diagnosis not present

## 2015-01-07 DIAGNOSIS — Z79899 Other long term (current) drug therapy: Secondary | ICD-10-CM | POA: Diagnosis not present

## 2015-01-07 DIAGNOSIS — F0390 Unspecified dementia without behavioral disturbance: Secondary | ICD-10-CM | POA: Insufficient documentation

## 2015-01-07 DIAGNOSIS — I959 Hypotension, unspecified: Secondary | ICD-10-CM | POA: Diagnosis not present

## 2015-01-07 DIAGNOSIS — K219 Gastro-esophageal reflux disease without esophagitis: Secondary | ICD-10-CM | POA: Insufficient documentation

## 2015-01-07 DIAGNOSIS — I25119 Atherosclerotic heart disease of native coronary artery with unspecified angina pectoris: Secondary | ICD-10-CM | POA: Diagnosis not present

## 2015-01-07 DIAGNOSIS — F329 Major depressive disorder, single episode, unspecified: Secondary | ICD-10-CM | POA: Diagnosis not present

## 2015-01-07 DIAGNOSIS — E785 Hyperlipidemia, unspecified: Secondary | ICD-10-CM | POA: Diagnosis not present

## 2015-01-07 DIAGNOSIS — Z7952 Long term (current) use of systemic steroids: Secondary | ICD-10-CM | POA: Diagnosis not present

## 2015-01-07 DIAGNOSIS — N390 Urinary tract infection, site not specified: Secondary | ICD-10-CM | POA: Insufficient documentation

## 2015-01-07 DIAGNOSIS — N289 Disorder of kidney and ureter, unspecified: Secondary | ICD-10-CM | POA: Diagnosis not present

## 2015-01-07 DIAGNOSIS — Z87891 Personal history of nicotine dependence: Secondary | ICD-10-CM | POA: Insufficient documentation

## 2015-01-07 DIAGNOSIS — K5900 Constipation, unspecified: Secondary | ICD-10-CM | POA: Diagnosis not present

## 2015-01-07 DIAGNOSIS — I723 Aneurysm of iliac artery: Secondary | ICD-10-CM | POA: Diagnosis not present

## 2015-01-07 DIAGNOSIS — J90 Pleural effusion, not elsewhere classified: Secondary | ICD-10-CM | POA: Diagnosis not present

## 2015-01-07 LAB — CBC WITH DIFFERENTIAL/PLATELET
BASOS ABS: 0 10*3/uL (ref 0.0–0.1)
BASOS PCT: 0 % (ref 0–1)
Eosinophils Absolute: 0 10*3/uL (ref 0.0–0.7)
Eosinophils Relative: 0 % (ref 0–5)
HEMATOCRIT: 40.5 % (ref 36.0–46.0)
HEMOGLOBIN: 13.1 g/dL (ref 12.0–15.0)
LYMPHS PCT: 8 % — AB (ref 12–46)
Lymphs Abs: 0.7 10*3/uL (ref 0.7–4.0)
MCH: 25.9 pg — ABNORMAL LOW (ref 26.0–34.0)
MCHC: 32.3 g/dL (ref 30.0–36.0)
MCV: 80.2 fL (ref 78.0–100.0)
MONO ABS: 0.2 10*3/uL (ref 0.1–1.0)
Monocytes Relative: 3 % (ref 3–12)
NEUTROS ABS: 7.9 10*3/uL — AB (ref 1.7–7.7)
NEUTROS PCT: 89 % — AB (ref 43–77)
PLATELETS: 205 10*3/uL (ref 150–400)
RBC: 5.05 MIL/uL (ref 3.87–5.11)
RDW: 15.8 % — AB (ref 11.5–15.5)
WBC: 8.9 10*3/uL (ref 4.0–10.5)

## 2015-01-07 LAB — COMPREHENSIVE METABOLIC PANEL
ALBUMIN: 3.4 g/dL — AB (ref 3.5–5.2)
ALT: 18 U/L (ref 0–35)
ANION GAP: 9 (ref 5–15)
AST: 19 U/L (ref 0–37)
Alkaline Phosphatase: 65 U/L (ref 39–117)
BUN: 15 mg/dL (ref 6–23)
CO2: 27 mmol/L (ref 19–32)
Calcium: 9.1 mg/dL (ref 8.4–10.5)
Chloride: 103 mmol/L (ref 96–112)
Creatinine, Ser: 1.37 mg/dL — ABNORMAL HIGH (ref 0.50–1.10)
GFR calc Af Amer: 45 mL/min — ABNORMAL LOW (ref >90)
GFR calc non Af Amer: 39 mL/min — ABNORMAL LOW (ref >90)
Glucose, Bld: 143 mg/dL — ABNORMAL HIGH (ref 70–99)
Potassium: 4 mmol/L (ref 3.5–5.1)
SODIUM: 139 mmol/L (ref 135–145)
TOTAL PROTEIN: 7.4 g/dL (ref 6.0–8.3)
Total Bilirubin: 0.5 mg/dL (ref 0.3–1.2)

## 2015-01-07 LAB — URINALYSIS, ROUTINE W REFLEX MICROSCOPIC
Bilirubin Urine: NEGATIVE
GLUCOSE, UA: NEGATIVE mg/dL
Hgb urine dipstick: NEGATIVE
Ketones, ur: NEGATIVE mg/dL
NITRITE: NEGATIVE
PH: 7 (ref 5.0–8.0)
Protein, ur: 100 mg/dL — AB
SPECIFIC GRAVITY, URINE: 1.012 (ref 1.005–1.030)
Urobilinogen, UA: 0.2 mg/dL (ref 0.0–1.0)

## 2015-01-07 LAB — URINE MICROSCOPIC-ADD ON

## 2015-01-07 LAB — I-STAT CG4 LACTIC ACID, ED
LACTIC ACID, VENOUS: 1.44 mmol/L (ref 0.5–2.0)
Lactic Acid, Venous: 2.26 mmol/L (ref 0.5–2.0)

## 2015-01-07 LAB — I-STAT TROPONIN, ED: Troponin i, poc: 0.03 ng/mL (ref 0.00–0.08)

## 2015-01-07 MED ORDER — SODIUM CHLORIDE 0.9 % IV BOLUS (SEPSIS)
1000.0000 mL | Freq: Once | INTRAVENOUS | Status: AC
  Administered 2015-01-07: 1000 mL via INTRAVENOUS

## 2015-01-07 MED ORDER — IOHEXOL 300 MG/ML  SOLN
25.0000 mL | INTRAMUSCULAR | Status: AC
  Administered 2015-01-07: 25 mL via ORAL

## 2015-01-07 MED ORDER — CEPHALEXIN 250 MG PO CAPS
500.0000 mg | ORAL_CAPSULE | Freq: Once | ORAL | Status: AC
  Administered 2015-01-07: 500 mg via ORAL
  Filled 2015-01-07: qty 2

## 2015-01-07 MED ORDER — CEPHALEXIN 500 MG PO CAPS: 500.0000 mg | ORAL_CAPSULE | Freq: Four times a day (QID) | ORAL | Status: DC

## 2015-01-07 MED ORDER — IOHEXOL 300 MG/ML  SOLN
100.0000 mL | Freq: Once | INTRAMUSCULAR | Status: AC | PRN
  Administered 2015-01-07: 100 mL via INTRAVENOUS

## 2015-01-07 NOTE — Discharge Instructions (Signed)
Patient with possible urinary tract infection. Keflex started. Cultures are being done. Renal nodule seen on CT scan will need follow-up. Patient feels improved here and has had a normal blood pressure with normal labs with the exception of one slightly elevated lactic acid which has normalized.

## 2015-01-07 NOTE — ED Notes (Signed)
[facility]- pt coming from [place] facility. Pt noted to have a significant decrease in blood pressure by staff to 60/40. Pt also noted to be altered. Unsure of how she was altered. Pt A&O X4 on arrival. Has hx of dementia. BP stable with EMS, BP lying: 130/90, sitting: 124/82, standing: 120/80.

## 2015-01-07 NOTE — ED Provider Notes (Signed)
CSN: [id number]     Arrival date & time 01/07/15  1131 History   First MD Initiated Contact with Patient 01/07/15 1210     Chief Complaint  Patient presents with  . Hypotension  . Altered Mental Status   Level 5 caveat   (Consider location/radiation/quality/duration/timing/severity/associated sxs/prior Treatment) HPI 69 y.o. Female from [facility] with h.o.dementia presents with report that she had a hypotensive episode this am and was unresponsive.  Returned to baseline prior to ems arrival without reported intervention.  Patient states she has some abdominal pain for about a week with some decreased appetite, no vomiting, fever, or diarrhea reported.  Patient with colostomy in place after surgery for rectal cancer.  Past Medical History  Diagnosis Date  . 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 Chronic diastolic CHF (congestive heart failure)   . CAD (coronary artery disease)     Dr. Matthew Saras  . Angina at rest   . Hyperlipidemia LDL goal < 100   . Aspiration pneumonia   . COPD (chronic obstructive pulmonary disease)   . Acute respiratory failure 12/21/10  . Pulmonary hypertension   . Chronic constipation   . Rectal cancer     s/p colostomy  . Thyroid goiter   . 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 Vascular dementia with depressed mood   . Pneumonia 12/21/10  . Delirium, induced by drug     polypharmacy  . Sacral fracture, closed     s/p kyphoplasty  . Fall     history of falls  . Dementia   . GERD (gastroesophageal reflux disease)   . Major depressive disorder   . CKD (chronic kidney disease)   . Myocardial infarction     "I've had about 3" (11/06/2014)  . 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 On home oxygen therapy     "prn at the nursing home" (11/06/2014)  . Migraine     "monthly" (11/06/2014)  . Stroke 2011    postop  . Stroke ?2014    "left side sometimes  weaker since" (11/06/2014)   Past Surgical History  Procedure Laterality Date  . Kyphoplasty      sacrum  . Colostomy      rectal ca  . Appendectomy    . Tonsillectomy    . Abdominal hysterectomy    . Tubal ligation    . Colon surgery     History reviewed. No pertinent family history. History  Substance Use Topics  . Smoking status: Former Smoker -- 0.50 packs/day for 2 years    Types: Cigarettes  . Smokeless tobacco: Never Used     Comment: "quit smoking cigarettes in 1969"  . Alcohol Use: No   OB History    No data available     Review of Systems    Allergies  Ace inhibitors  Home Medications   Prior to Admission medications   Medication Sig Start Date End Date Taking? Authorizing Provider  acetaminophen (TYLENOL) 325 MG tablet Take 650 mg by mouth every 6 (six) hours as needed for mild pain.    Historical Provider, MD  amLODipine (NORVASC) 10 MG tablet Take 10 mg by mouth daily.    Historical Provider, MD  aspirin (ASPIRIN EC) 81 MG EC tablet Take 81 mg by mouth daily. Swallow whole.    Historical Provider, MD  atorvastatin (LIPITOR) 20 MG tablet Take 20 mg by mouth at bedtime.  Historical Provider, MD  carvedilol (COREG) 3.125 MG tablet Take 3.125 mg by mouth 2 (two) times daily with a meal.    Historical Provider, MD  cloNIDine (CATAPRES) 0.1 MG tablet Take 0.1 mg by mouth 2 (two) times daily.     Historical Provider, MD  docusate sodium (COLACE) 100 MG capsule Take 100 mg by mouth 3 (three) times daily.    Historical Provider, MD  glipiZIDE (GLUCOTROL) 5 MG tablet Take 0.5 tablets (2.5 mg total) by mouth daily before breakfast. 12/25/14   Gerlene Fee, NP  hydrALAZINE (APRESOLINE) 25 MG tablet Take 10 mg by mouth 4 (four) times daily.     Historical Provider, MD  ipratropium-albuterol (DUONEB) 0.5-2.5 (3) MG/3ML SOLN Take 3 mLs by nebulization every 6 (six) hours as needed (shortness of breathe/wheezing).    Historical Provider, MD  isosorbide mononitrate  (ISMO,MONOKET) 20 MG tablet Take 60 mg by mouth daily.     Historical Provider, MD  losartan (COZAAR) 25 MG tablet Take 1 tablet (25 mg total) by mouth daily. 12/25/14   Gerlene Fee, NP  nitroGLYCERIN (NITROSTAT) 0.4 MG SL tablet Place 0.4 mg under the tongue every 5 (five) minutes as needed for chest pain.    Historical Provider, MD  omeprazole (PRILOSEC) 20 MG capsule Take 20 mg by mouth daily.    Historical Provider, MD  PARoxetine (PAXIL) 20 MG tablet Take 30 mg by mouth daily.     Historical Provider, MD  polyvinyl alcohol (LIQUIFILM TEARS) 1.4 % ophthalmic solution Place 2 drops into both eyes 3 (three) times daily as needed for dry eyes.    Historical Provider, MD  predniSONE (DELTASONE) 20 MG tablet Take 1 tablet (20 mg total) by mouth daily with breakfast. 11/08/14   Caren Griffins, MD  ranitidine (ZANTAC) 150 MG capsule Take 150 mg by mouth every evening.    Historical Provider, MD  sennosides-docusate sodium (SENOKOT-S) 8.6-50 MG tablet Take 2 tablets by mouth every evening.     Historical Provider, MD  traMADol (ULTRAM) 50 MG tablet Take one tablet by mouth every 8 hours as needed for pain 11/12/14   Lauree Chandler, NP  Vitamin D, Ergocalciferol, (DRISDOL) 50000 UNITS CAPS capsule Take 50,000 Units by mouth every 30 (thirty) days.    Historical Provider, MD   BP 122/72 mmHg  Pulse 83  Temp(Src) 98 F (36.7 C) (Oral)  Resp 20  SpO2 94% Physical Exam  Constitutional: She is oriented to person, place, and time. She appears well-developed and well-nourished.  HENT:  Head: Normocephalic and atraumatic.  Right Ear: External ear normal.  Left Ear: External ear normal.  Nose: Nose normal.  Mouth/Throat: Oropharynx is clear and moist.  Eyes: Conjunctivae and EOM are normal. Pupils are equal, round, and reactive to light.  Neck: Normal range of motion. Neck supple.  Cardiovascular: Normal rate, regular rhythm, normal heart sounds and intact distal pulses.   Pulmonary/Chest:  Effort normal and breath sounds normal.  Abdominal: Soft. Bowel sounds are normal. There is tenderness.    Colostomy llq  Musculoskeletal: Normal range of motion.  Neurological: She is alert and oriented to person, place, and time. She has normal reflexes.  Skin: Skin is warm and dry.  Nursing note and vitals reviewed.   ED Course  Procedures (including critical care time) Labs Review Labs Reviewed  CBC WITH DIFFERENTIAL/PLATELET - Abnormal; Notable for the following:    MCH 25.9 (*)    RDW 15.8 (*)    Neutrophils Relative %  89 (*)    Neutro Abs 7.9 (*)    Lymphocytes Relative 8 (*)    All other components within normal limits  COMPREHENSIVE METABOLIC PANEL - Abnormal; Notable for the following:    Glucose, Bld 143 (*)    Creatinine, Ser 1.37 (*)    Albumin 3.4 (*)    GFR calc non Af Amer 39 (*)    GFR calc Af Amer 45 (*)    All other components within normal limits  URINALYSIS, ROUTINE W REFLEX MICROSCOPIC - Abnormal; Notable for the following:    APPearance HAZY (*)    Protein, ur 100 (*)    Leukocytes, UA TRACE (*)    All other components within normal limits  URINE MICROSCOPIC-ADD ON - Abnormal; Notable for the following:    Squamous Epithelial / LPF FEW (*)    Bacteria, UA MANY (*)    All other components within normal limits  I-STAT CG4 LACTIC ACID, ED - Abnormal; Notable for the following:    Lactic Acid, Venous 2.26 (*)    All other components within normal limits  I-STAT TROPOININ, ED  I-STAT CG4 LACTIC ACID, ED    Imaging Review Dg Chest 1 View  01/07/2015   CLINICAL DATA:  Hypertension, altered mental status.  EXAM: CHEST  1 VIEW  COMPARISON:  November 06, 2014.  FINDINGS: Stable cardiomegaly. No pneumothorax or pleural effusion is noted. Bony thorax is intact. Mild central pulmonary vascular congestion is noted. Minimal right basilar subsegmental atelectasis is noted. Bony thorax is intact.  IMPRESSION: Stable cardiomegaly and mild central pulmonary vascular  congestion. Minimal right basilar subsegmental atelectasis.   Electronically Signed   By: Marijo Conception, M.D.   On: 01/07/2015 13:29   Ct Abdomen Pelvis W Contrast  01/07/2015   CLINICAL DATA:  Diffuse abdominal pain for 2 days, history malignant essential hypertension, chronic diastolic CHF, coronary artery disease post MI, hyperlipidemia, COPD, pulmonary hypertension, vascular dementia, diabetes mellitus type 2  EXAM: CT ABDOMEN AND PELVIS WITH CONTRAST  TECHNIQUE: Multidetector CT imaging of the abdomen and pelvis was performed using the standard protocol following bolus administration of intravenous contrast. Sagittal and coronal MPR images reconstructed from axial data set.  CONTRAST:  183mL OMNIPAQUE IOHEXOL 300 MG/ML SOLN IV. Dilute oral contrast.  COMPARISON:  12/09/2010 CT pelvis, CT abdomen/pelvis 08/02/2009  FINDINGS: Bibasilar atelectasis.  Minimal pericardial effusion.  Scattered atherosclerotic calcifications aorta and coronary arteries.  Liver, spleen, pancreas and adrenal glands normal.  BILATERAL renal cortical atrophy and lobulated renal contours.  Enlarging nodule with enhancing margins at upper pole of LEFT kidney concerning for a renal neoplasm 2.5 x 2.5 x 2.8 cm.  Scattered colonic diverticulosis.  Interval abdominoperineal resection with sigmoid colostomy LEFT lower quadrant.  Stranding in the presacral space which may be related to preceding surgery.  Stomach and bowel loops otherwise normal appearance.  Mild aneurysmal dilatation of the LEFT internal iliac artery image 66, 17 mm diameter.  Unremarkable bladder and ureters.  Normal appendix low in pelvis.  Multiple collaterals medial to LEFT kidney.  No mass, adenopathy, free air, or hernia.  Osseous demineralization with BILATERAL sacral fractures and sacral plasties.  IMPRESSION: Interval abdominoperineal resection without discrete pelvic mass or adenopathy.  Enlarging nodule at with enhancing margins at medial aspect upper pole LEFT  kidney concerning for a renal neoplasm ; this be further characterize by MR imaging with and without contrast.  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   Electronically Signed  By: Lavonia Dana M.D.   On: 01/07/2015 16:21     EKG Interpretation   Date/Time:  Monday January 07 2015 12:51:55 EST Ventricular Rate:  73 PR Interval:  159 QRS Duration: 133 QT Interval:  461 QTC Calculation: 508 R Axis:   16 Text Interpretation:  Sinus rhythm Consider left atrial enlargement Left  bundle branch block Confirmed by Johniya Durfee MD, Andee Poles ([id number]) on 01/07/2015  4:42:42 PM      MDM   Final diagnoses:  Lower abdominal pain  Nodule of kidney  UTI (lower urinary tract infection)    69 year old female history of metastatic colon cancer and dementia referred here with report of hypotension at the nursing home. She has been normotensive during her stay here. Her labs are normal. She did have a mildly elevated lactate acid which has cleared. Patient with many bacteria in urine 3-6 white blood cells and 02 red blood cells in urine. This was a catheterized specimen and will treat pending culture 1- hypotension- no hypotension here for 5 hours. 2- mental status - appears at baseline, patient with expressive aphasia but able to answer all questions.  3- kidney nodule - will need follow up 4- abdominal pain- no definite cause found.  Patient feels improved, no vomiting. Possible uti as source     Shaune Pollack, MD 01/08/15 [phone]

## 2015-01-07 NOTE — ED Notes (Signed)
Cannot perform rectal temp.

## 2015-01-08 DIAGNOSIS — J449 Chronic obstructive pulmonary disease, unspecified: Secondary | ICD-10-CM | POA: Diagnosis not present

## 2015-01-08 DIAGNOSIS — K219 Gastro-esophageal reflux disease without esophagitis: Secondary | ICD-10-CM | POA: Diagnosis not present

## 2015-01-08 DIAGNOSIS — E049 Nontoxic goiter, unspecified: Secondary | ICD-10-CM | POA: Diagnosis not present

## 2015-01-08 DIAGNOSIS — I5032 Chronic diastolic (congestive) heart failure: Secondary | ICD-10-CM | POA: Diagnosis not present

## 2015-01-08 DIAGNOSIS — J189 Pneumonia, unspecified organism: Secondary | ICD-10-CM | POA: Diagnosis not present

## 2015-01-08 DIAGNOSIS — I10 Essential (primary) hypertension: Secondary | ICD-10-CM | POA: Diagnosis not present

## 2015-01-08 DIAGNOSIS — R1312 Dysphagia, oropharyngeal phase: Secondary | ICD-10-CM | POA: Diagnosis not present

## 2015-01-08 DIAGNOSIS — F329 Major depressive disorder, single episode, unspecified: Secondary | ICD-10-CM | POA: Diagnosis not present

## 2015-01-08 DIAGNOSIS — E785 Hyperlipidemia, unspecified: Secondary | ICD-10-CM | POA: Diagnosis not present

## 2015-01-08 DIAGNOSIS — M6281 Muscle weakness (generalized): Secondary | ICD-10-CM | POA: Diagnosis not present

## 2015-01-08 DIAGNOSIS — F0280 Dementia in other diseases classified elsewhere without behavioral disturbance: Secondary | ICD-10-CM | POA: Diagnosis not present

## 2015-01-08 DIAGNOSIS — N189 Chronic kidney disease, unspecified: Secondary | ICD-10-CM | POA: Diagnosis not present

## 2015-01-08 DIAGNOSIS — E119 Type 2 diabetes mellitus without complications: Secondary | ICD-10-CM | POA: Diagnosis not present

## 2015-01-08 DIAGNOSIS — I6789 Other cerebrovascular disease: Secondary | ICD-10-CM | POA: Diagnosis not present

## 2015-01-09 DIAGNOSIS — E785 Hyperlipidemia, unspecified: Secondary | ICD-10-CM | POA: Diagnosis not present

## 2015-01-09 DIAGNOSIS — I10 Essential (primary) hypertension: Secondary | ICD-10-CM | POA: Diagnosis not present

## 2015-01-09 DIAGNOSIS — F329 Major depressive disorder, single episode, unspecified: Secondary | ICD-10-CM | POA: Diagnosis not present

## 2015-01-09 DIAGNOSIS — I5032 Chronic diastolic (congestive) heart failure: Secondary | ICD-10-CM | POA: Diagnosis not present

## 2015-01-09 DIAGNOSIS — E119 Type 2 diabetes mellitus without complications: Secondary | ICD-10-CM | POA: Diagnosis not present

## 2015-01-09 DIAGNOSIS — F0280 Dementia in other diseases classified elsewhere without behavioral disturbance: Secondary | ICD-10-CM | POA: Diagnosis not present

## 2015-01-10 ENCOUNTER — Other Ambulatory Visit: Payer: Self-pay | Admitting: Nephrology

## 2015-01-10 DIAGNOSIS — N183 Chronic kidney disease, stage 3 unspecified: Secondary | ICD-10-CM

## 2015-01-10 DIAGNOSIS — F0280 Dementia in other diseases classified elsewhere without behavioral disturbance: Secondary | ICD-10-CM | POA: Diagnosis not present

## 2015-01-10 DIAGNOSIS — F329 Major depressive disorder, single episode, unspecified: Secondary | ICD-10-CM | POA: Diagnosis not present

## 2015-01-10 DIAGNOSIS — I5032 Chronic diastolic (congestive) heart failure: Secondary | ICD-10-CM | POA: Diagnosis not present

## 2015-01-10 DIAGNOSIS — I10 Essential (primary) hypertension: Secondary | ICD-10-CM | POA: Diagnosis not present

## 2015-01-10 DIAGNOSIS — E119 Type 2 diabetes mellitus without complications: Secondary | ICD-10-CM | POA: Diagnosis not present

## 2015-01-10 DIAGNOSIS — E785 Hyperlipidemia, unspecified: Secondary | ICD-10-CM | POA: Diagnosis not present

## 2015-01-11 DIAGNOSIS — I5032 Chronic diastolic (congestive) heart failure: Secondary | ICD-10-CM | POA: Diagnosis not present

## 2015-01-11 DIAGNOSIS — E785 Hyperlipidemia, unspecified: Secondary | ICD-10-CM | POA: Diagnosis not present

## 2015-01-11 DIAGNOSIS — E119 Type 2 diabetes mellitus without complications: Secondary | ICD-10-CM | POA: Diagnosis not present

## 2015-01-11 DIAGNOSIS — F329 Major depressive disorder, single episode, unspecified: Secondary | ICD-10-CM | POA: Diagnosis not present

## 2015-01-11 DIAGNOSIS — F0280 Dementia in other diseases classified elsewhere without behavioral disturbance: Secondary | ICD-10-CM | POA: Diagnosis not present

## 2015-01-11 DIAGNOSIS — I10 Essential (primary) hypertension: Secondary | ICD-10-CM | POA: Diagnosis not present

## 2015-01-14 DIAGNOSIS — E785 Hyperlipidemia, unspecified: Secondary | ICD-10-CM | POA: Diagnosis not present

## 2015-01-14 DIAGNOSIS — I10 Essential (primary) hypertension: Secondary | ICD-10-CM | POA: Diagnosis not present

## 2015-01-14 DIAGNOSIS — F0280 Dementia in other diseases classified elsewhere without behavioral disturbance: Secondary | ICD-10-CM | POA: Diagnosis not present

## 2015-01-14 DIAGNOSIS — E119 Type 2 diabetes mellitus without complications: Secondary | ICD-10-CM | POA: Diagnosis not present

## 2015-01-14 DIAGNOSIS — I5032 Chronic diastolic (congestive) heart failure: Secondary | ICD-10-CM | POA: Diagnosis not present

## 2015-01-14 DIAGNOSIS — F329 Major depressive disorder, single episode, unspecified: Secondary | ICD-10-CM | POA: Diagnosis not present

## 2015-01-15 DIAGNOSIS — I10 Essential (primary) hypertension: Secondary | ICD-10-CM | POA: Diagnosis not present

## 2015-01-15 DIAGNOSIS — I5032 Chronic diastolic (congestive) heart failure: Secondary | ICD-10-CM | POA: Diagnosis not present

## 2015-01-15 DIAGNOSIS — E119 Type 2 diabetes mellitus without complications: Secondary | ICD-10-CM | POA: Diagnosis not present

## 2015-01-15 DIAGNOSIS — F0280 Dementia in other diseases classified elsewhere without behavioral disturbance: Secondary | ICD-10-CM | POA: Diagnosis not present

## 2015-01-15 DIAGNOSIS — F329 Major depressive disorder, single episode, unspecified: Secondary | ICD-10-CM | POA: Diagnosis not present

## 2015-01-15 DIAGNOSIS — E785 Hyperlipidemia, unspecified: Secondary | ICD-10-CM | POA: Diagnosis not present

## 2015-01-16 ENCOUNTER — Ambulatory Visit
Admission: RE | Admit: 2015-01-16 | Discharge: 2015-01-16 | Disposition: A | Discharge status: Home / Self Care | Payer: Medicare Other | Source: Ambulatory Visit | Attending: Nephrology | Admitting: Nephrology

## 2015-01-16 DIAGNOSIS — E119 Type 2 diabetes mellitus without complications: Secondary | ICD-10-CM | POA: Diagnosis not present

## 2015-01-16 DIAGNOSIS — F329 Major depressive disorder, single episode, unspecified: Secondary | ICD-10-CM | POA: Diagnosis not present

## 2015-01-16 DIAGNOSIS — N189 Chronic kidney disease, unspecified: Secondary | ICD-10-CM | POA: Diagnosis not present

## 2015-01-16 DIAGNOSIS — N183 Chronic kidney disease, stage 3 unspecified: Secondary | ICD-10-CM

## 2015-01-16 DIAGNOSIS — E785 Hyperlipidemia, unspecified: Secondary | ICD-10-CM | POA: Diagnosis not present

## 2015-01-16 DIAGNOSIS — F0280 Dementia in other diseases classified elsewhere without behavioral disturbance: Secondary | ICD-10-CM | POA: Diagnosis not present

## 2015-01-16 DIAGNOSIS — I10 Essential (primary) hypertension: Secondary | ICD-10-CM | POA: Diagnosis not present

## 2015-01-16 DIAGNOSIS — I5032 Chronic diastolic (congestive) heart failure: Secondary | ICD-10-CM | POA: Diagnosis not present

## 2015-01-17 DIAGNOSIS — F329 Major depressive disorder, single episode, unspecified: Secondary | ICD-10-CM | POA: Diagnosis not present

## 2015-01-17 DIAGNOSIS — I5032 Chronic diastolic (congestive) heart failure: Secondary | ICD-10-CM | POA: Diagnosis not present

## 2015-01-17 DIAGNOSIS — I10 Essential (primary) hypertension: Secondary | ICD-10-CM | POA: Diagnosis not present

## 2015-01-17 DIAGNOSIS — F0280 Dementia in other diseases classified elsewhere without behavioral disturbance: Secondary | ICD-10-CM | POA: Diagnosis not present

## 2015-01-17 DIAGNOSIS — E785 Hyperlipidemia, unspecified: Secondary | ICD-10-CM | POA: Diagnosis not present

## 2015-01-17 DIAGNOSIS — E119 Type 2 diabetes mellitus without complications: Secondary | ICD-10-CM | POA: Diagnosis not present

## 2015-01-18 DIAGNOSIS — I5032 Chronic diastolic (congestive) heart failure: Secondary | ICD-10-CM | POA: Diagnosis not present

## 2015-01-18 DIAGNOSIS — F0280 Dementia in other diseases classified elsewhere without behavioral disturbance: Secondary | ICD-10-CM | POA: Diagnosis not present

## 2015-01-18 DIAGNOSIS — E785 Hyperlipidemia, unspecified: Secondary | ICD-10-CM | POA: Diagnosis not present

## 2015-01-18 DIAGNOSIS — F329 Major depressive disorder, single episode, unspecified: Secondary | ICD-10-CM | POA: Diagnosis not present

## 2015-01-18 DIAGNOSIS — I10 Essential (primary) hypertension: Secondary | ICD-10-CM | POA: Diagnosis not present

## 2015-01-18 DIAGNOSIS — E119 Type 2 diabetes mellitus without complications: Secondary | ICD-10-CM | POA: Diagnosis not present

## 2015-01-21 DIAGNOSIS — E785 Hyperlipidemia, unspecified: Secondary | ICD-10-CM | POA: Diagnosis not present

## 2015-01-21 DIAGNOSIS — I10 Essential (primary) hypertension: Secondary | ICD-10-CM | POA: Diagnosis not present

## 2015-01-21 DIAGNOSIS — F0280 Dementia in other diseases classified elsewhere without behavioral disturbance: Secondary | ICD-10-CM | POA: Diagnosis not present

## 2015-01-21 DIAGNOSIS — E119 Type 2 diabetes mellitus without complications: Secondary | ICD-10-CM | POA: Diagnosis not present

## 2015-01-21 DIAGNOSIS — F329 Major depressive disorder, single episode, unspecified: Secondary | ICD-10-CM | POA: Diagnosis not present

## 2015-01-21 DIAGNOSIS — I5032 Chronic diastolic (congestive) heart failure: Secondary | ICD-10-CM | POA: Diagnosis not present

## 2015-01-22 DIAGNOSIS — E785 Hyperlipidemia, unspecified: Secondary | ICD-10-CM | POA: Diagnosis not present

## 2015-01-22 DIAGNOSIS — F329 Major depressive disorder, single episode, unspecified: Secondary | ICD-10-CM | POA: Diagnosis not present

## 2015-01-22 DIAGNOSIS — F0280 Dementia in other diseases classified elsewhere without behavioral disturbance: Secondary | ICD-10-CM | POA: Diagnosis not present

## 2015-01-22 DIAGNOSIS — I10 Essential (primary) hypertension: Secondary | ICD-10-CM | POA: Diagnosis not present

## 2015-01-22 DIAGNOSIS — I5032 Chronic diastolic (congestive) heart failure: Secondary | ICD-10-CM | POA: Diagnosis not present

## 2015-01-22 DIAGNOSIS — E119 Type 2 diabetes mellitus without complications: Secondary | ICD-10-CM | POA: Diagnosis not present

## 2015-01-23 DIAGNOSIS — I10 Essential (primary) hypertension: Secondary | ICD-10-CM | POA: Diagnosis not present

## 2015-01-23 DIAGNOSIS — I5032 Chronic diastolic (congestive) heart failure: Secondary | ICD-10-CM | POA: Diagnosis not present

## 2015-01-23 DIAGNOSIS — E785 Hyperlipidemia, unspecified: Secondary | ICD-10-CM | POA: Diagnosis not present

## 2015-01-23 DIAGNOSIS — F329 Major depressive disorder, single episode, unspecified: Secondary | ICD-10-CM | POA: Diagnosis not present

## 2015-01-23 DIAGNOSIS — E119 Type 2 diabetes mellitus without complications: Secondary | ICD-10-CM | POA: Diagnosis not present

## 2015-01-23 DIAGNOSIS — F0280 Dementia in other diseases classified elsewhere without behavioral disturbance: Secondary | ICD-10-CM | POA: Diagnosis not present

## 2015-01-24 DIAGNOSIS — E119 Type 2 diabetes mellitus without complications: Secondary | ICD-10-CM | POA: Diagnosis not present

## 2015-01-24 DIAGNOSIS — E785 Hyperlipidemia, unspecified: Secondary | ICD-10-CM | POA: Diagnosis not present

## 2015-01-24 DIAGNOSIS — I10 Essential (primary) hypertension: Secondary | ICD-10-CM | POA: Diagnosis not present

## 2015-01-24 DIAGNOSIS — I5032 Chronic diastolic (congestive) heart failure: Secondary | ICD-10-CM | POA: Diagnosis not present

## 2015-01-24 DIAGNOSIS — F0280 Dementia in other diseases classified elsewhere without behavioral disturbance: Secondary | ICD-10-CM | POA: Diagnosis not present

## 2015-01-24 DIAGNOSIS — F329 Major depressive disorder, single episode, unspecified: Secondary | ICD-10-CM | POA: Diagnosis not present

## 2015-01-25 ENCOUNTER — Non-Acute Institutional Stay (SKILLED_NURSING_FACILITY): Payer: Medicare Other | Admitting: Adult Health

## 2015-01-25 DIAGNOSIS — I2510 Atherosclerotic heart disease of native coronary artery without angina pectoris: Secondary | ICD-10-CM | POA: Diagnosis not present

## 2015-01-25 DIAGNOSIS — K219 Gastro-esophageal reflux disease without esophagitis: Secondary | ICD-10-CM

## 2015-01-25 DIAGNOSIS — I110 Hypertensive heart disease with heart failure: Secondary | ICD-10-CM | POA: Diagnosis not present

## 2015-01-25 DIAGNOSIS — E119 Type 2 diabetes mellitus without complications: Secondary | ICD-10-CM | POA: Diagnosis not present

## 2015-01-25 DIAGNOSIS — F0393 Unspecified dementia, unspecified severity, with mood disturbance: Secondary | ICD-10-CM

## 2015-01-25 DIAGNOSIS — F329 Major depressive disorder, single episode, unspecified: Secondary | ICD-10-CM | POA: Diagnosis not present

## 2015-01-25 DIAGNOSIS — J438 Other emphysema: Secondary | ICD-10-CM | POA: Diagnosis not present

## 2015-01-25 DIAGNOSIS — I10 Essential (primary) hypertension: Secondary | ICD-10-CM | POA: Diagnosis not present

## 2015-01-25 DIAGNOSIS — K5900 Constipation, unspecified: Secondary | ICD-10-CM | POA: Diagnosis not present

## 2015-01-25 DIAGNOSIS — I5032 Chronic diastolic (congestive) heart failure: Secondary | ICD-10-CM | POA: Diagnosis not present

## 2015-01-25 DIAGNOSIS — F0280 Dementia in other diseases classified elsewhere without behavioral disturbance: Secondary | ICD-10-CM | POA: Diagnosis not present

## 2015-01-25 DIAGNOSIS — I509 Heart failure, unspecified: Secondary | ICD-10-CM

## 2015-01-25 DIAGNOSIS — E785 Hyperlipidemia, unspecified: Secondary | ICD-10-CM

## 2015-01-25 DIAGNOSIS — F0153 Vascular dementia, unspecified severity, with mood disturbance: Secondary | ICD-10-CM

## 2015-01-25 DIAGNOSIS — K5909 Other constipation: Secondary | ICD-10-CM

## 2015-01-25 DIAGNOSIS — F0151 Vascular dementia with behavioral disturbance: Secondary | ICD-10-CM | POA: Diagnosis not present

## 2015-01-28 DIAGNOSIS — I10 Essential (primary) hypertension: Secondary | ICD-10-CM | POA: Diagnosis not present

## 2015-01-28 DIAGNOSIS — E119 Type 2 diabetes mellitus without complications: Secondary | ICD-10-CM | POA: Diagnosis not present

## 2015-01-28 DIAGNOSIS — N183 Chronic kidney disease, stage 3 (moderate): Secondary | ICD-10-CM | POA: Diagnosis not present

## 2015-01-28 DIAGNOSIS — I5032 Chronic diastolic (congestive) heart failure: Secondary | ICD-10-CM | POA: Diagnosis not present

## 2015-01-28 DIAGNOSIS — N2581 Secondary hyperparathyroidism of renal origin: Secondary | ICD-10-CM | POA: Diagnosis not present

## 2015-01-28 DIAGNOSIS — F329 Major depressive disorder, single episode, unspecified: Secondary | ICD-10-CM | POA: Diagnosis not present

## 2015-01-28 DIAGNOSIS — D631 Anemia in chronic kidney disease: Secondary | ICD-10-CM | POA: Diagnosis not present

## 2015-01-28 DIAGNOSIS — F0280 Dementia in other diseases classified elsewhere without behavioral disturbance: Secondary | ICD-10-CM | POA: Diagnosis not present

## 2015-01-28 DIAGNOSIS — I129 Hypertensive chronic kidney disease with stage 1 through stage 4 chronic kidney disease, or unspecified chronic kidney disease: Secondary | ICD-10-CM | POA: Diagnosis not present

## 2015-01-28 DIAGNOSIS — E785 Hyperlipidemia, unspecified: Secondary | ICD-10-CM | POA: Diagnosis not present

## 2015-01-29 DIAGNOSIS — E785 Hyperlipidemia, unspecified: Secondary | ICD-10-CM | POA: Diagnosis not present

## 2015-01-29 DIAGNOSIS — I5032 Chronic diastolic (congestive) heart failure: Secondary | ICD-10-CM | POA: Diagnosis not present

## 2015-01-29 DIAGNOSIS — E119 Type 2 diabetes mellitus without complications: Secondary | ICD-10-CM | POA: Diagnosis not present

## 2015-01-29 DIAGNOSIS — F329 Major depressive disorder, single episode, unspecified: Secondary | ICD-10-CM | POA: Diagnosis not present

## 2015-01-29 DIAGNOSIS — I10 Essential (primary) hypertension: Secondary | ICD-10-CM | POA: Diagnosis not present

## 2015-01-29 DIAGNOSIS — F0280 Dementia in other diseases classified elsewhere without behavioral disturbance: Secondary | ICD-10-CM | POA: Diagnosis not present

## 2015-01-30 DIAGNOSIS — I10 Essential (primary) hypertension: Secondary | ICD-10-CM | POA: Diagnosis not present

## 2015-01-30 DIAGNOSIS — E119 Type 2 diabetes mellitus without complications: Secondary | ICD-10-CM | POA: Diagnosis not present

## 2015-01-30 DIAGNOSIS — E785 Hyperlipidemia, unspecified: Secondary | ICD-10-CM | POA: Diagnosis not present

## 2015-01-30 DIAGNOSIS — F329 Major depressive disorder, single episode, unspecified: Secondary | ICD-10-CM | POA: Diagnosis not present

## 2015-01-30 DIAGNOSIS — I5032 Chronic diastolic (congestive) heart failure: Secondary | ICD-10-CM | POA: Diagnosis not present

## 2015-01-30 DIAGNOSIS — F0280 Dementia in other diseases classified elsewhere without behavioral disturbance: Secondary | ICD-10-CM | POA: Diagnosis not present

## 2015-01-30 IMAGING — US US SOFT TISSUE HEAD/NECK
1 series · 14 of 25 positions shown · non-contrast
Comparison: CT 12/09/2010 and earlier studies

CLINICAL DATA: Enlarged thyroid

EXAM:
THYROID ULTRASOUND
TECHNIQUE: Ultrasound examination of the thyroid gland and adjacent soft
tissues was performed.

[Series 1: us soft tissue head/neck · 0.07mm/px · 14 of 49 slices shown]
[im 1/49]
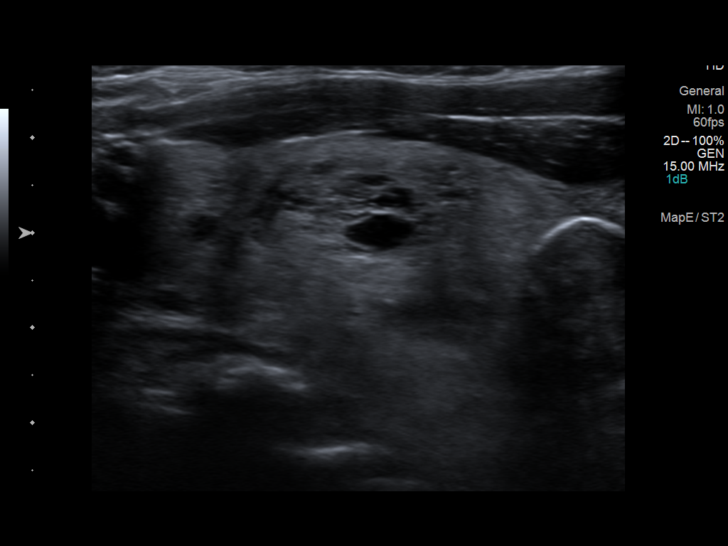
[im 5/49]
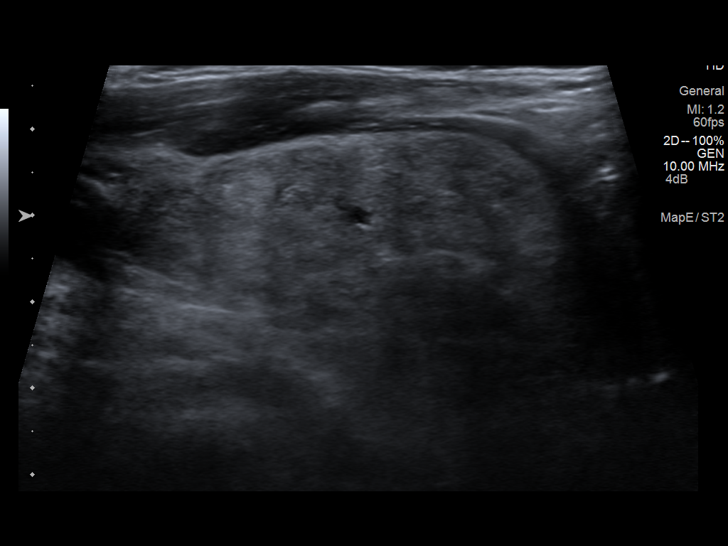
[im 9/49]
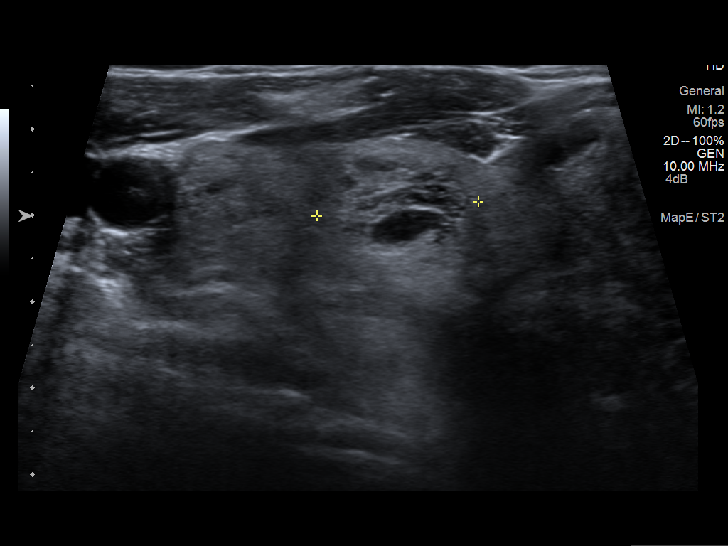
[im 13/49]
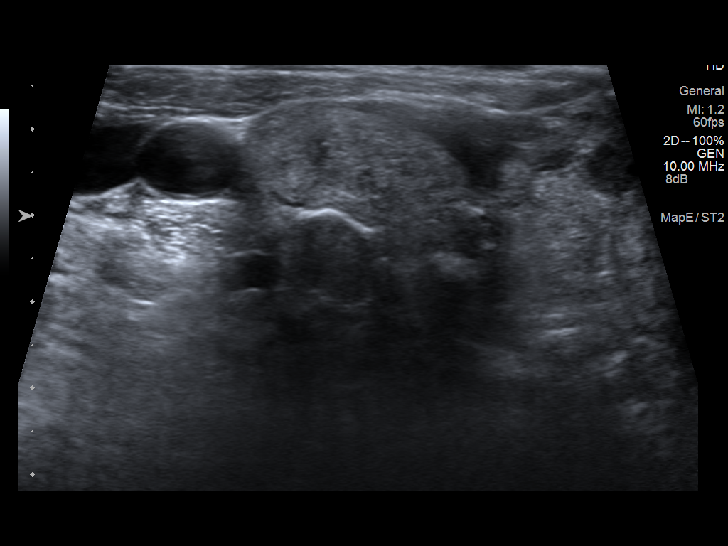
[im 17/49]
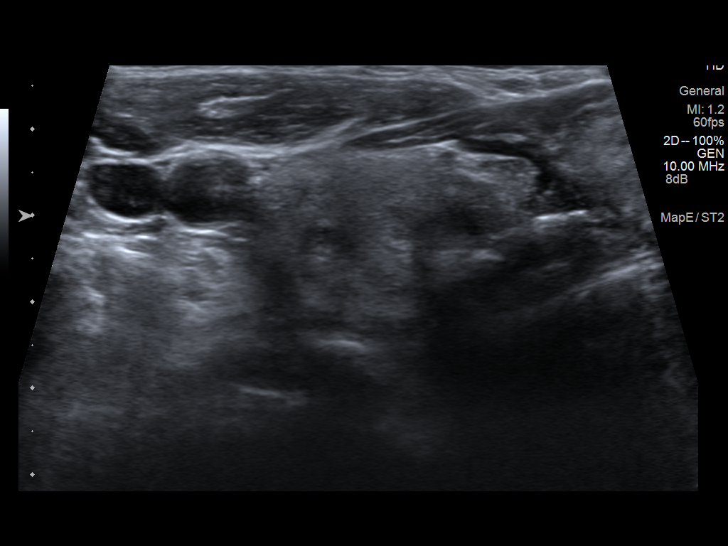
[im 19/49]
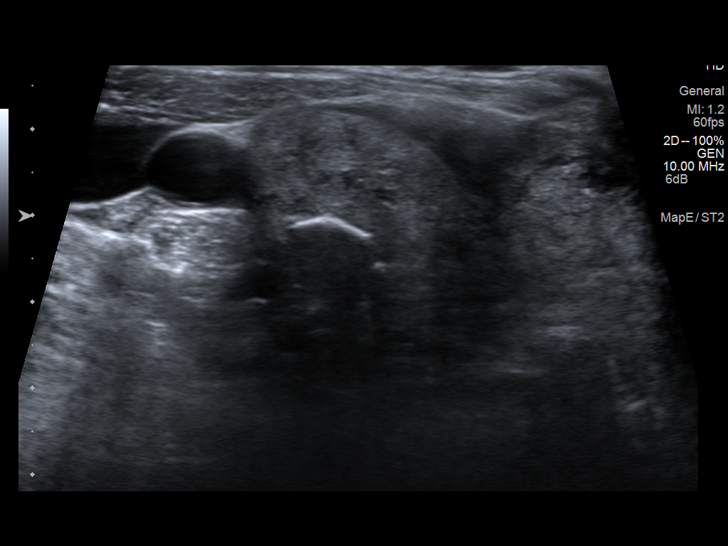
[im 23/49]
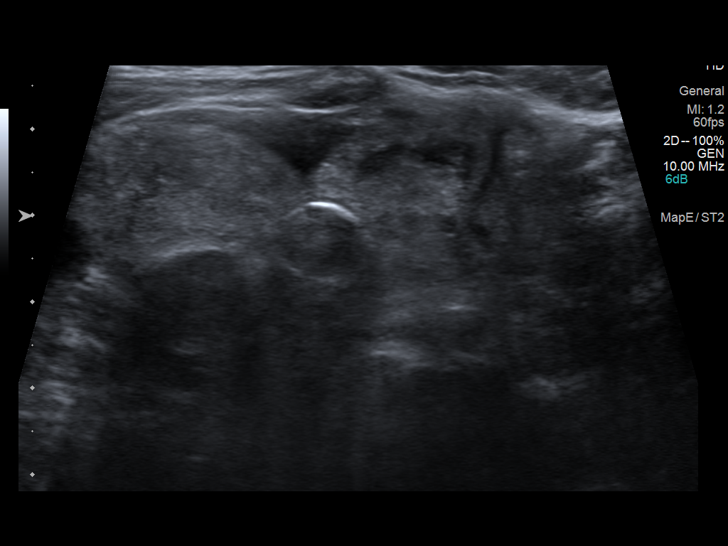
[im 27/49]
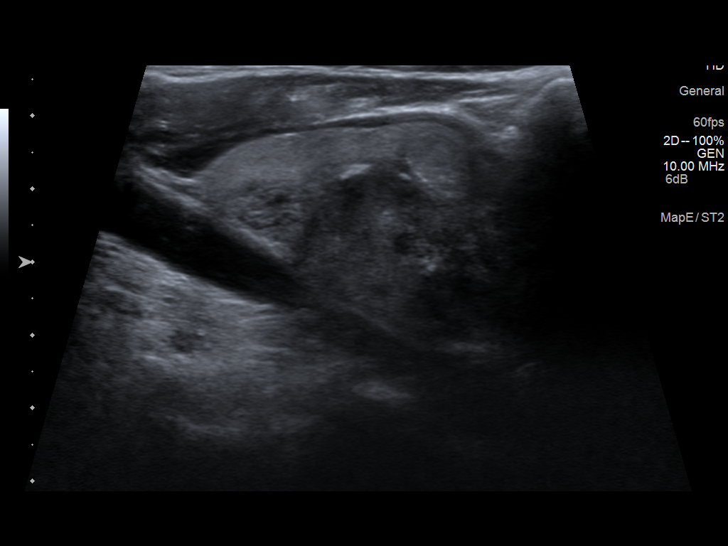
[im 31/49]
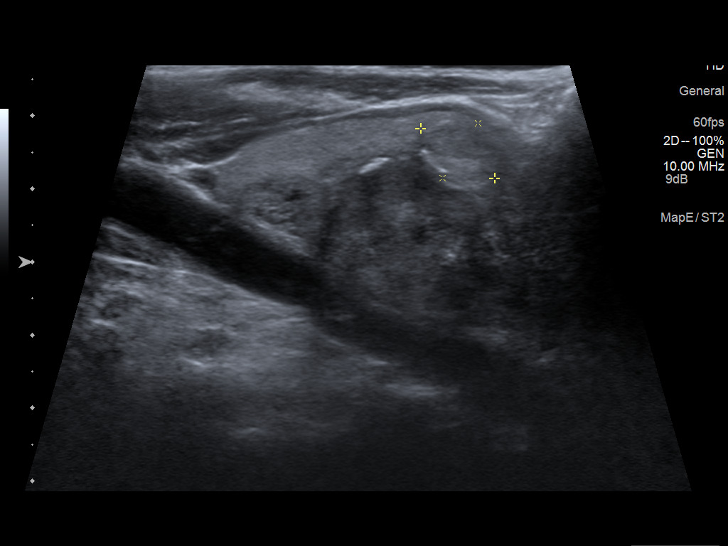
[im 33/49]
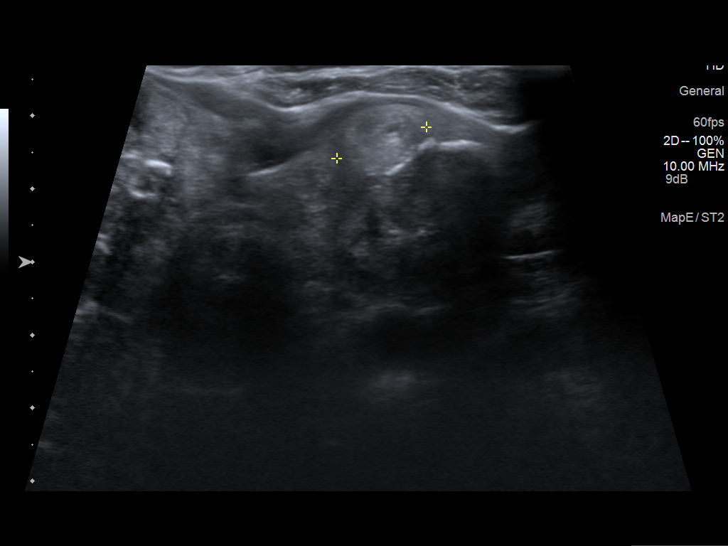
[im 37/49]
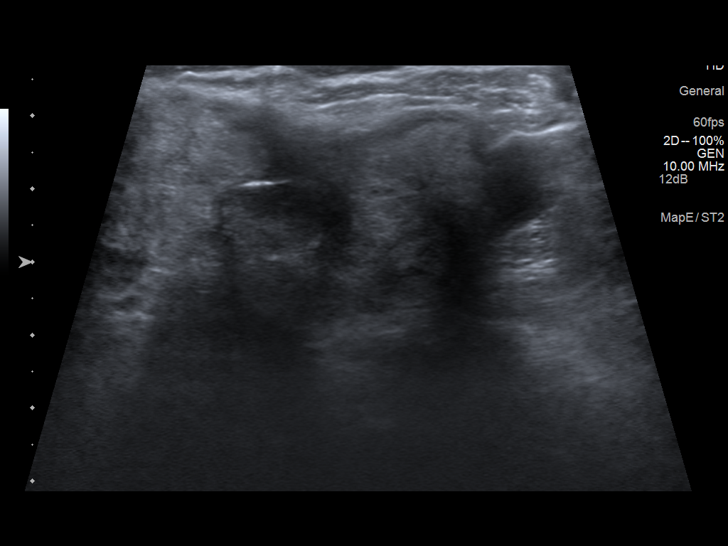
[im 41/49]
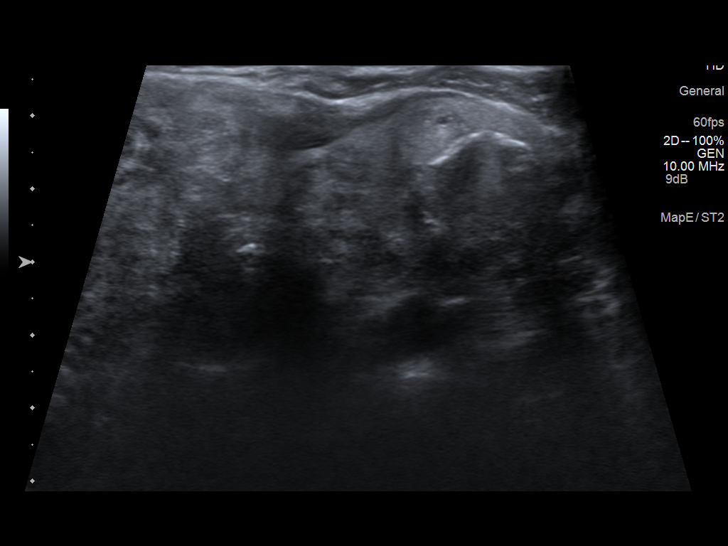
[im 45/49]
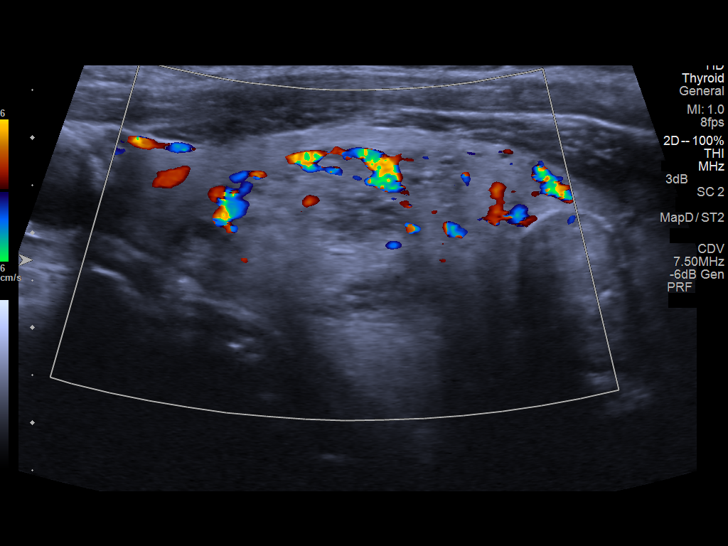
[im 49/49]
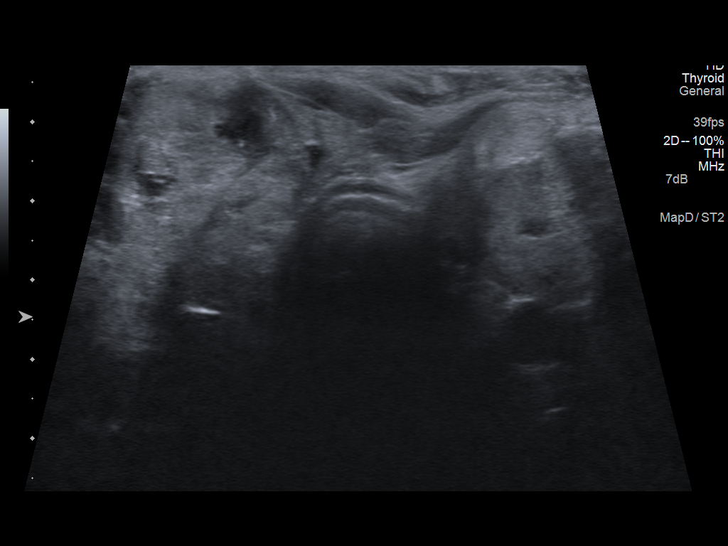

[14 of 25 positions shown; findings below may reference images not displayed]

FINDINGS: Right thyroid lobe

Measurements: 72 x 37 x 36 mm. Inhomogeneous background echotexture.
Dominant mid lobe nodule 20 x 16 x 19 mm. Deep inferior 14 x 12 mm
nodule with coarse peripheral calcification. 15 x 12 mm nodule deep
mid lobe near the junction with the isthmus, with coarse peripheral
calcification.

Left thyroid lobe

Measurements: 59 x 26 x 39 mm. Dominant 47 x 24 x 33 mm lower pole
nodule. Smaller 12 x 9 x 13 mm solid mid lobe nodule.

Isthmus

Thickness: 19 mm.  No nodules visualized.

Lymphadenopathy

None visualized.
IMPRESSION: 1. Thyromegaly with bilateral nodules. Dominant bilateral lesions
meet consensus criteria for biopsy. Ultrasound-guided fine needle
aspiration should be considered, as per the consensus statement:
Management of Thyroid Nodules Detected at US: Society of
Radiologists in Ultrasound Consensus Conference Statement. Radiology

## 2015-01-31 DIAGNOSIS — E785 Hyperlipidemia, unspecified: Secondary | ICD-10-CM | POA: Diagnosis not present

## 2015-01-31 DIAGNOSIS — I5032 Chronic diastolic (congestive) heart failure: Secondary | ICD-10-CM | POA: Diagnosis not present

## 2015-01-31 DIAGNOSIS — I10 Essential (primary) hypertension: Secondary | ICD-10-CM | POA: Diagnosis not present

## 2015-01-31 DIAGNOSIS — E119 Type 2 diabetes mellitus without complications: Secondary | ICD-10-CM | POA: Diagnosis not present

## 2015-01-31 DIAGNOSIS — F329 Major depressive disorder, single episode, unspecified: Secondary | ICD-10-CM | POA: Diagnosis not present

## 2015-01-31 DIAGNOSIS — F0280 Dementia in other diseases classified elsewhere without behavioral disturbance: Secondary | ICD-10-CM | POA: Diagnosis not present

## 2015-03-08 ENCOUNTER — Non-Acute Institutional Stay (SKILLED_NURSING_FACILITY): Payer: Medicare Other | Admitting: Adult Health

## 2015-03-08 DIAGNOSIS — F0153 Vascular dementia, unspecified severity, with mood disturbance: Secondary | ICD-10-CM

## 2015-03-08 DIAGNOSIS — K5900 Constipation, unspecified: Secondary | ICD-10-CM | POA: Diagnosis not present

## 2015-03-08 DIAGNOSIS — F0151 Vascular dementia with behavioral disturbance: Secondary | ICD-10-CM

## 2015-03-08 DIAGNOSIS — E785 Hyperlipidemia, unspecified: Secondary | ICD-10-CM

## 2015-03-08 DIAGNOSIS — I5032 Chronic diastolic (congestive) heart failure: Secondary | ICD-10-CM

## 2015-03-08 DIAGNOSIS — K219 Gastro-esophageal reflux disease without esophagitis: Secondary | ICD-10-CM | POA: Diagnosis not present

## 2015-03-08 DIAGNOSIS — F329 Major depressive disorder, single episode, unspecified: Secondary | ICD-10-CM

## 2015-03-08 DIAGNOSIS — F0393 Unspecified dementia, unspecified severity, with mood disturbance: Secondary | ICD-10-CM

## 2015-03-08 DIAGNOSIS — F0280 Dementia in other diseases classified elsewhere without behavioral disturbance: Secondary | ICD-10-CM

## 2015-03-08 DIAGNOSIS — I509 Heart failure, unspecified: Secondary | ICD-10-CM

## 2015-03-08 DIAGNOSIS — J438 Other emphysema: Secondary | ICD-10-CM

## 2015-03-08 DIAGNOSIS — I639 Cerebral infarction, unspecified: Secondary | ICD-10-CM | POA: Diagnosis not present

## 2015-03-08 DIAGNOSIS — I6350 Cerebral infarction due to unspecified occlusion or stenosis of unspecified cerebral artery: Secondary | ICD-10-CM

## 2015-03-08 DIAGNOSIS — I110 Hypertensive heart disease with heart failure: Secondary | ICD-10-CM

## 2015-03-08 DIAGNOSIS — K5909 Other constipation: Secondary | ICD-10-CM

## 2015-03-11 DIAGNOSIS — E559 Vitamin D deficiency, unspecified: Secondary | ICD-10-CM | POA: Diagnosis not present

## 2015-03-11 DIAGNOSIS — I10 Essential (primary) hypertension: Secondary | ICD-10-CM | POA: Diagnosis not present

## 2015-03-11 DIAGNOSIS — E119 Type 2 diabetes mellitus without complications: Secondary | ICD-10-CM | POA: Diagnosis not present

## 2015-03-11 DIAGNOSIS — D649 Anemia, unspecified: Secondary | ICD-10-CM | POA: Diagnosis not present

## 2015-04-02 DIAGNOSIS — R262 Difficulty in walking, not elsewhere classified: Secondary | ICD-10-CM | POA: Diagnosis not present

## 2015-04-02 DIAGNOSIS — M79676 Pain in unspecified toe(s): Secondary | ICD-10-CM | POA: Diagnosis not present

## 2015-04-02 DIAGNOSIS — B351 Tinea unguium: Secondary | ICD-10-CM | POA: Diagnosis not present

## 2015-04-02 DIAGNOSIS — E1159 Type 2 diabetes mellitus with other circulatory complications: Secondary | ICD-10-CM | POA: Diagnosis not present

## 2015-04-07 ENCOUNTER — Encounter: Payer: Self-pay | Admitting: Adult Health

## 2015-04-07 NOTE — Progress Notes (Signed)
Patient ID: Sheila Flynn, female   DOB: November 25, 1945, 69 y.o.   MRN: [id number]  Sheila Flynn living [facility]     Allergies  Allergen Reactions  . Ace Inhibitors Swelling       Chief Complaint  Patient presents with  . Medical Management of Chronic Issues    HPI:  She is a long term resident of this facility being seen for the management of her chronic illnesses. Overall her status remains stable. She is not voicing any concerns or complaints at this time. There are no nursing concerns at this time.     Past Medical History  Diagnosis Date  . 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 Chronic diastolic CHF (congestive heart failure)   . CAD (coronary artery disease)     Dr. Matthew Saras  . Angina at rest   . Hyperlipidemia LDL goal < 100   . Aspiration pneumonia   . COPD (chronic obstructive pulmonary disease)   . Acute respiratory failure 12/21/10  . Pulmonary hypertension   . Chronic constipation   . Rectal cancer     s/p colostomy  . Thyroid goiter   . 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 Vascular dementia with depressed mood   . Pneumonia 12/21/10  . Delirium, induced by drug     polypharmacy  . Sacral fracture, closed     s/p kyphoplasty  . Fall     history of falls  . Dementia   . GERD (gastroesophageal reflux disease)   . Major depressive disorder   . CKD (chronic kidney disease)   . Myocardial infarction     "I've had about 3" (11/06/2014)  . 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 On home oxygen therapy     "prn at the nursing home" (11/06/2014)  . Migraine     "monthly" (11/06/2014)  . Stroke 2011    postop  . Stroke ?2014    "left side sometimes weaker since" (11/06/2014)    Past Surgical History  Procedure Laterality Date  . Kyphoplasty      sacrum  . Colostomy      rectal ca  . Appendectomy    . Tonsillectomy    . Abdominal  hysterectomy    . Tubal ligation    . Colon surgery      VITAL SIGNS BP 133/54 mmHg  Pulse 62  Ht 5\' 7"  (1.702 m)  Wt 173 lb (78.472 kg)  BMI 27.09 kg/m2   Outpatient Encounter Prescriptions as of 01/25/2015  Medication Sig  . acetaminophen (TYLENOL) 325 MG tablet Take 650 mg by mouth every 6 (six) hours as needed for mild pain.  Marland Kitchen amLODipine (NORVASC) 10 MG tablet Take 10 mg by mouth daily.  Marland Kitchen aspirin (ASPIRIN EC) 81 MG EC tablet Take 81 mg by mouth daily. Swallow whole.  Marland Kitchen atorvastatin (LIPITOR) 20 MG tablet Take 20 mg by mouth at bedtime.   . carvedilol (COREG) 3.125 MG tablet Take 3.125 mg by mouth 2 (two) times daily with a meal.  . docusate sodium (COLACE) 100 MG capsule Take 100 mg by mouth 3 (three) times daily.  Marland Kitchen glipiZIDE (GLUCOTROL) 5 MG tablet Take 0.5 tablets (2.5 mg total) by mouth daily before breakfast.  . hydrALAZINE (APRESOLINE) 25 MG tablet Take 25 mg by mouth 2 (two) times daily.   Marland Kitchen ipratropium-albuterol (DUONEB) 0.5-2.5 (3) MG/3ML SOLN Take 3 mLs by nebulization every 6 (six)  hours as needed (shortness of breathe/wheezing).  . isosorbide mononitrate (ISMO,MONOKET) 20 MG tablet Take 60 mg by mouth daily.   Marland Kitchen losartan (COZAAR) 25 MG tablet Take 1 tablet (25 mg total) by mouth daily.  . nitroGLYCERIN (NITROSTAT) 0.4 MG SL tablet Place 0.4 mg under the tongue every 5 (five) minutes as needed for chest pain.  Marland Kitchen omeprazole (PRILOSEC) 20 MG capsule Take 30 mg by mouth daily.   Marland Kitchen PARoxetine (PAXIL) 20 MG tablet Take 30 mg by mouth daily.   . polyvinyl alcohol (LIQUIFILM TEARS) 1.4 % ophthalmic solution Place 2 drops into both eyes 3 (three) times daily as needed for dry eyes.  . predniSONE (DELTASONE) 20 MG tablet  Take 5 mg by mouth daily with breakfast.   . PRESCRIPTION MEDICATION Take 60 mLs by mouth 2 (two) times daily. 2 Cal feeding supplement  . ranitidine (ZANTAC) 150 MG capsule Take 150 mg by mouth every evening.  . sennosides-docusate sodium (SENOKOT-S) 8.6-50  MG tablet Take 2 tablets by mouth every evening.   . traMADol (ULTRAM) 50 MG tablet Take one tablet by mouth every 8 hours as needed for pain  . Vitamin D, Ergocalciferol, (DRISDOL) 50000 UNITS CAPS capsule Take 50,000 Units by mouth every 30 (thirty) days.      SIGNIFICANT DIAGNOSTIC EXAMS   11-06-14: chest x-ray: Probable left lower lobe retrocardiac opacity, atelectasis versuspneumonia. Grossly stable cardiomegaly allowing for differences in technique.   LABS REVIEWED:   07-02-14: wbc 5.7; hgb 11.7; hct 35.0; mcv 77.3; plt 281; glucose 37; bun 23; creat 1.48; k+3.6 ;na++141; liver normal albumin 4.1; tsh 0.487 08-13-14: chol 148; ldl 93; trig 104; hgb a1c 7.1; urine micro-albumin 41.5  10-31-14: glucose 100 bun 14; creat 1.38; k+3.7; na++140; vit d 31  11-06-14: wbc 7.3; hgb 12.1; hct 38.5; mcv 81.9; plt 227; glucose 108; bun 15; creat 1.42; k+3.3; na++142 11-12-14: wbc 5.5; hgb 11.5; hct 36.4; mcv 81.4; plt 237; glucose 50; bun 21; creat 1.4; k+4.2; na++140; liver normal albumin 3.5; tsh 0.487; hgb a1c 6.1      ROS Constitutional: Negative for malaise/fatigue.  Respiratory: Negative for cough and shortness of breath.   Cardiovascular: Negative for chest pain, palpitations and leg swelling.  Gastrointestinal: Negative for heartburn, abdominal pain and constipation.  Musculoskeletal: Negative for myalgias and joint pain.  Skin: Negative.   Neurological: Negative for headaches.  Psychiatric/Behavioral: Negative for depression. The patient is not nervous/anxious.        Physical Exam Constitutional: She appears well-developed and well-nourished. No distress.  Neck: Neck supple. No JVD present. No thyromegaly present.  Cardiovascular: Normal rate, regular rhythm and intact distal pulses.   Respiratory: Effort normal and breath sounds normal. No respiratory distress.  GI: Soft. Bowel sounds are normal. She exhibits no distension. There is no tenderness.  Colostomy     Musculoskeletal:  Is able to move all extremities  No edema   Neurological: She is alert.  Skin: Skin is warm and dry. She is not diaphoretic.       ASSESSMENT/ PLAN:   1. Hypertension: : will continue norvasc 10 mg daily; cozaar 25 mg daily; hydralazine 25 mg twice daily  coreg 3.125 mg twice daily and will monitor  2. Dyslipidemia: will continue lipitor 20 mg daily ldl is 95  3. Diabetes: is presently stable will continue glipizide 2.5 mg daily hgb a1c is 6.1  4. COPD: is stable will continue duoneb every 6 hours as needed; will continue prednisone 5 mg daily and will  monitor his status.   5. CAD: no complaints of chest pain present will continue imdur 60 mg daily; asa 81 mg daily and prn ntg; will monitor  6. Gerd: will continue zantac 150 mg nightly and prilosec 20 mg daily   7. Constipation: will continue colace three times daily; and senna s 2 tabs daily   8. Depression: will continue paxil 30 mg daily   9.  CVA: is neurologically stable; will continue asa 81 mg daily   10. Vascular dementia: on change in her status; is presently not on medications; will not make changes will monitor her status.   11. Diastolic heart failure: will continue coreg 3.125 mg twice daily; hydralazine 25 mg twice daily imdur 60 mg daily and will monitor her status.      Ok Edwards NP [facility]  Contact [phone] Monday through Friday 8am- 5pm  After hours call [phone]

## 2015-04-19 ENCOUNTER — Non-Acute Institutional Stay (SKILLED_NURSING_FACILITY): Payer: Medicare Other | Admitting: Adult Health

## 2015-04-19 DIAGNOSIS — I110 Hypertensive heart disease with heart failure: Secondary | ICD-10-CM

## 2015-04-19 DIAGNOSIS — F0280 Dementia in other diseases classified elsewhere without behavioral disturbance: Secondary | ICD-10-CM

## 2015-04-19 DIAGNOSIS — F0393 Unspecified dementia, unspecified severity, with mood disturbance: Secondary | ICD-10-CM

## 2015-04-19 DIAGNOSIS — I509 Heart failure, unspecified: Secondary | ICD-10-CM

## 2015-04-19 DIAGNOSIS — K5900 Constipation, unspecified: Secondary | ICD-10-CM | POA: Diagnosis not present

## 2015-04-19 DIAGNOSIS — K5909 Other constipation: Secondary | ICD-10-CM

## 2015-04-19 DIAGNOSIS — J438 Other emphysema: Secondary | ICD-10-CM | POA: Diagnosis not present

## 2015-04-19 DIAGNOSIS — I2510 Atherosclerotic heart disease of native coronary artery without angina pectoris: Secondary | ICD-10-CM

## 2015-04-19 DIAGNOSIS — E785 Hyperlipidemia, unspecified: Secondary | ICD-10-CM | POA: Diagnosis not present

## 2015-04-19 DIAGNOSIS — E1159 Type 2 diabetes mellitus with other circulatory complications: Secondary | ICD-10-CM

## 2015-04-19 DIAGNOSIS — F329 Major depressive disorder, single episode, unspecified: Secondary | ICD-10-CM

## 2015-04-19 DIAGNOSIS — K219 Gastro-esophageal reflux disease without esophagitis: Secondary | ICD-10-CM

## 2015-04-19 DIAGNOSIS — E1151 Type 2 diabetes mellitus with diabetic peripheral angiopathy without gangrene: Secondary | ICD-10-CM

## 2015-04-22 DIAGNOSIS — I10 Essential (primary) hypertension: Secondary | ICD-10-CM | POA: Diagnosis not present

## 2015-04-22 DIAGNOSIS — E119 Type 2 diabetes mellitus without complications: Secondary | ICD-10-CM | POA: Diagnosis not present

## 2015-05-06 DIAGNOSIS — F329 Major depressive disorder, single episode, unspecified: Secondary | ICD-10-CM | POA: Diagnosis not present

## 2015-05-08 NOTE — Progress Notes (Signed)
Patient ID: Sheila Flynn, female   DOB: 03-11-1946, 69 y.o.   MRN: [id number]  [name] living [place]     Allergies  Allergen Reactions  . Ace Inhibitors Swelling       Chief Complaint  Patient presents with  . Medical Management of Chronic Issues    HPI:  She is a long term resident of this facility being seen for hte management of her chronic illnesses. overall her status remains without change. She is not voicing any complaints or concerns today. There are no nursing concerns today.    Past Medical History  Diagnosis Date  . 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 Chronic diastolic CHF (congestive heart failure)   . CAD (coronary artery disease)     Dr. Matthew Saras  . Angina at rest   . Hyperlipidemia LDL goal < 100   . Aspiration pneumonia   . COPD (chronic obstructive pulmonary disease)   . Acute respiratory failure 12/21/10  . Pulmonary hypertension   . Chronic constipation   . Rectal cancer     s/p colostomy  . Thyroid goiter   . 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 Vascular dementia with depressed mood   . Pneumonia 12/21/10  . Delirium, induced by drug     polypharmacy  . Sacral fracture, closed     s/p kyphoplasty  . Fall     history of falls  . Dementia   . GERD (gastroesophageal reflux disease)   . Major depressive disorder   . CKD (chronic kidney disease)   . Myocardial infarction     "I've had about 3" (11/06/2014)  . 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 On home oxygen therapy     "prn at the nursing home" (11/06/2014)  . Migraine     "monthly" (11/06/2014)  . Stroke 2011    postop  . Stroke ?2014    "left side sometimes weaker since" (11/06/2014)    Past Surgical History  Procedure Laterality Date  . Kyphoplasty      sacrum  . Colostomy      rectal ca  . Appendectomy    . Tonsillectomy    . Abdominal hysterectomy      . Tubal ligation    . Colon surgery      VITAL SIGNS BP 144/81 mmHg  Pulse 79  Ht 5\' 7"  (1.702 m)  Wt 173 lb (78.472 kg)  BMI 27.09 kg/m2   Outpatient Encounter Prescriptions as of 03/08/2015  Medication Sig  . acetaminophen (TYLENOL) 325 MG tablet Take 650 mg by mouth every 6 (six) hours as needed for mild pain.  Marland Kitchen amLODipine (NORVASC) 10 MG tablet Take 10 mg by mouth daily.  Marland Kitchen aspirin (ASPIRIN EC) 81 MG EC tablet Take 81 mg by mouth daily. Swallow whole.  Marland Kitchen atorvastatin (LIPITOR) 20 MG tablet Take 20 mg by mouth at bedtime.   . carvedilol (COREG) 3.125 MG tablet Take 3.125 mg by mouth 2 (two) times daily with a meal.  . docusate sodium (COLACE) 100 MG capsule Take 100 mg by mouth 3 (three) times daily.  Marland Kitchen glipiZIDE (GLUCOTROL) 5 MG tablet Take 0.5 tablets (2.5 mg total) by mouth daily before breakfast.  . hydrALAZINE (APRESOLINE) 25 MG tablet Take 25 mg by mouth 2 (two) times daily.   Marland Kitchen ipratropium-albuterol (DUONEB) 0.5-2.5 (3) MG/3ML SOLN Take 3 mLs by nebulization every 6 (six) hours as needed (  shortness of breathe/wheezing).  . isosorbide mononitrate (ISMO,MONOKET) 20 MG tablet Take 60 mg by mouth daily.   Marland Kitchen losartan (COZAAR) 25 MG tablet Take 1 tablet (25 mg total) by mouth daily.  . nitroGLYCERIN (NITROSTAT) 0.4 MG SL tablet Place 0.4 mg under the tongue every 5 (five) minutes as needed for chest pain.  Marland Kitchen omeprazole (PRILOSEC) 20 MG capsule Take 30 mg by mouth daily.   Marland Kitchen PARoxetine (PAXIL) 20 MG tablet Take 30 mg by mouth daily.   . polyvinyl alcohol (LIQUIFILM TEARS) 1.4 % ophthalmic solution Place 2 drops into both eyes 3 (three) times daily as needed for dry eyes.  . predniSONE (DELTASONE) 20 MG tablet Take 5 mg by mouth daily with breakfast. )  . PRESCRIPTION MEDICATION Take 60 mLs by mouth 2 (two) times daily. 2 Cal feeding supplement  . ranitidine (ZANTAC) 150 MG capsule Take 150 mg by mouth every evening.  . sennosides-docusate sodium (SENOKOT-S) 8.6-50 MG tablet Take  2 tablets by mouth every evening.   . traMADol (ULTRAM) 50 MG tablet Take one tablet by mouth every 8 hours as needed for pain  . Vitamin D, Ergocalciferol, (DRISDOL) 50000 UNITS CAPS capsule Take 50,000 Units by mouth every 30 (thirty) days.      SIGNIFICANT DIAGNOSTIC EXAMS  11-06-14: chest x-ray: Probable left lower lobe retrocardiac opacity, atelectasis versuspneumonia. Grossly stable cardiomegaly allowing for differences in technique.   LABS REVIEWED:   07-02-14: wbc 5.7; hgb 11.7; hct 35.0; mcv 77.3; plt 281; glucose 37; bun 23; creat 1.48; k+3.6 ;na++141; liver normal albumin 4.1; tsh 0.487 08-13-14: chol 148; ldl 93; trig 104; hgb a1c 7.1; urine micro-albumin 41.5  10-31-14: glucose 100 bun 14; creat 1.38; k+3.7; na++140; vit d 31  11-06-14: wbc 7.3; hgb 12.1; hct 38.5; mcv 81.9; plt 227; glucose 108; bun 15; creat 1.42; k+3.3; na++142 11-12-14: wbc 5.5; hgb 11.5; hct 36.4; mcv 81.4; plt 237; glucose 50; bun 21; creat 1.4; k+4.2; na++140; liver normal albumin 3.5; tsh 0.487; hgb a1c 6.1     ROS Constitutional: Negative for malaise/fatigue.  Respiratory: Negative for cough and shortness of breath.   Cardiovascular: Negative for chest pain, palpitations and leg swelling.  Gastrointestinal: Negative for heartburn, abdominal pain and constipation.  Musculoskeletal: Negative for myalgias and joint pain.  Skin: Negative.   Neurological: Negative for headaches.  Psychiatric/Behavioral: Negative for depression. The patient is not nervous/anxious.       Physical Exam Constitutional: She appears well-developed and well-nourished. No distress.  Neck: Neck supple. No JVD present. No thyromegaly present.  Cardiovascular: Normal rate, regular rhythm and intact distal pulses.   Respiratory: Effort normal and breath sounds normal. No respiratory distress.  GI: Soft. Bowel sounds are normal. She exhibits no distension. There is no tenderness.  Colostomy   Musculoskeletal:  Is able to  move all extremities  No edema   Neurological: She is alert.  Skin: Skin is warm and dry. She is not diaphoretic.        ASSESSMENT/ PLAN:  1. Hypertension: : will continue norvasc 10 mg daily; cozaar 25 mg daily; hydralazine 25 mg twice daily  coreg 3.125 mg twice daily and will monitor  2. Dyslipidemia: will continue lipitor 20 mg daily ldl is 95  3. Diabetes: is presently stable will continue glipizide 2.5 mg daily hgb a1c is 6.1  4. COPD: is stable will continue duoneb every 6 hours as needed; will continue prednisone 5 mg daily and will monitor his status.   5. CAD: no  complaints of chest pain present will continue imdur 60 mg daily; asa 81 mg daily and prn ntg; will monitor  6. Jerrye Bushy: will continue  prilosec 20 mg daily and will stop the zantac   7. Constipation: will continue colace three times daily; and senna s 2 tabs daily   8. Depression: will continue paxil 30 mg daily   9.  CVA: is neurologically stable; will continue asa 81 mg daily   10. Vascular dementia: on change in her status; is presently not on medications; will not make changes will monitor her status.   11. Diastolic heart failure: will continue coreg 3.125 mg twice daily; hydralazine 25 mg twice daily imdur 60 mg daily and will monitor her status.    Will check cbc; cmp; lipids; hgb a1c; vit d       Ok Edwards NP [facility]  Contact [phone] Monday through Friday 8am- 5pm  After hours call [phone]

## 2015-05-20 ENCOUNTER — Non-Acute Institutional Stay (SKILLED_NURSING_FACILITY): Payer: Medicare Other | Admitting: Adult Health

## 2015-05-20 DIAGNOSIS — F329 Major depressive disorder, single episode, unspecified: Secondary | ICD-10-CM | POA: Diagnosis not present

## 2015-05-20 DIAGNOSIS — F0153 Vascular dementia, unspecified severity, with mood disturbance: Secondary | ICD-10-CM

## 2015-05-20 DIAGNOSIS — F0280 Dementia in other diseases classified elsewhere without behavioral disturbance: Secondary | ICD-10-CM | POA: Diagnosis not present

## 2015-05-20 DIAGNOSIS — I2510 Atherosclerotic heart disease of native coronary artery without angina pectoris: Secondary | ICD-10-CM | POA: Diagnosis not present

## 2015-05-20 DIAGNOSIS — E785 Hyperlipidemia, unspecified: Secondary | ICD-10-CM | POA: Diagnosis not present

## 2015-05-20 DIAGNOSIS — K219 Gastro-esophageal reflux disease without esophagitis: Secondary | ICD-10-CM | POA: Diagnosis not present

## 2015-05-20 DIAGNOSIS — J438 Other emphysema: Secondary | ICD-10-CM

## 2015-05-20 DIAGNOSIS — N189 Chronic kidney disease, unspecified: Secondary | ICD-10-CM

## 2015-05-20 DIAGNOSIS — I5032 Chronic diastolic (congestive) heart failure: Secondary | ICD-10-CM

## 2015-05-20 DIAGNOSIS — I129 Hypertensive chronic kidney disease with stage 1 through stage 4 chronic kidney disease, or unspecified chronic kidney disease: Secondary | ICD-10-CM

## 2015-05-20 DIAGNOSIS — K5909 Other constipation: Secondary | ICD-10-CM

## 2015-05-20 DIAGNOSIS — F0151 Vascular dementia with behavioral disturbance: Secondary | ICD-10-CM

## 2015-05-20 DIAGNOSIS — I130 Hypertensive heart and chronic kidney disease with heart failure and stage 1 through stage 4 chronic kidney disease, or unspecified chronic kidney disease: Secondary | ICD-10-CM

## 2015-05-20 DIAGNOSIS — K5900 Constipation, unspecified: Secondary | ICD-10-CM

## 2015-05-20 DIAGNOSIS — F0393 Unspecified dementia, unspecified severity, with mood disturbance: Secondary | ICD-10-CM

## 2015-05-20 DIAGNOSIS — E1159 Type 2 diabetes mellitus with other circulatory complications: Secondary | ICD-10-CM

## 2015-05-20 DIAGNOSIS — E1151 Type 2 diabetes mellitus with diabetic peripheral angiopathy without gangrene: Secondary | ICD-10-CM

## 2015-05-20 DIAGNOSIS — I509 Heart failure, unspecified: Secondary | ICD-10-CM | POA: Diagnosis not present

## 2015-05-27 DIAGNOSIS — E119 Type 2 diabetes mellitus without complications: Secondary | ICD-10-CM | POA: Diagnosis not present

## 2015-06-10 ENCOUNTER — Encounter: Payer: Self-pay | Admitting: Adult Health

## 2015-06-10 NOTE — Progress Notes (Signed)
Patient ID: Sheila Flynn, female   DOB: 1946/02/03, 69 y.o.   MRN: [id number]   Facility: [facility]      Allergies  Allergen Reactions  . Ace Inhibitors Swelling    Chief Complaint  Patient presents with  . Medical Management of Chronic Issues    HPI:  She is a long term resident of this facility being seen for the management of her chronic illnesses. Overall her status remains without change. She is not voicing any concerns or complaints today. There are no nursing concerns. Her cbg's are ok.     Past Medical History  Diagnosis Date  . 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 Chronic diastolic CHF (congestive heart failure)   . CAD (coronary artery disease)     Dr. Matthew Saras  . Angina at rest   . Hyperlipidemia LDL goal < 100   . Aspiration pneumonia   . COPD (chronic obstructive pulmonary disease)   . Acute respiratory failure 12/21/10  . Pulmonary hypertension   . Chronic constipation   . Rectal cancer     s/p colostomy  . Thyroid goiter   . 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 Vascular dementia with depressed mood   . Pneumonia 12/21/10  . Delirium, induced by drug     polypharmacy  . Sacral fracture, closed     s/p kyphoplasty  . Fall     history of falls  . Dementia   . GERD (gastroesophageal reflux disease)   . Major depressive disorder   . CKD (chronic kidney disease)   . Myocardial infarction     "I've had about 3" (11/06/2014)  . 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 On home oxygen therapy     "prn at the nursing home" (11/06/2014)  . Migraine     "monthly" (11/06/2014)  . Stroke 2011    postop  . Stroke ?2014    "left side sometimes weaker since" (11/06/2014)    Past Surgical History  Procedure Laterality Date  . Kyphoplasty      sacrum  . Colostomy      rectal ca  . Appendectomy    . Tonsillectomy    .  Abdominal hysterectomy    . Tubal ligation    . Colon surgery      VITAL SIGNS BP 140/79 mmHg  Pulse 98  Ht 5\' 7"  (1.702 m)  Wt 181 lb (82.101 kg)  BMI 28.34 kg/m2  Patient's Medications  New Prescriptions   No medications on file  Previous Medications   ACETAMINOPHEN (TYLENOL) 325 MG TABLET    Take 650 mg by mouth every 6 (six) hours as needed for mild pain.   AMLODIPINE (NORVASC) 10 MG TABLET    Take 10 mg by mouth daily.   ASPIRIN (ASPIRIN EC) 81 MG EC TABLET    Take 81 mg by mouth daily. Swallow whole.   ATORVASTATIN (LIPITOR) 20 MG TABLET    Take 20 mg by mouth at bedtime.    CARVEDILOL (COREG) 3.125 MG TABLET    Take 3.125 mg by mouth 2 (two) times daily with a meal.   DOCUSATE SODIUM (COLACE) 100 MG CAPSULE    Take 100 mg by mouth 3 (three) times daily.   GLIPIZIDE (GLUCOTROL) 5 MG TABLET    Take 0.5 tablets (2.5 mg total) by mouth daily before breakfast.   HYDRALAZINE (APRESOLINE) 25 MG TABLET  Take 25 mg by mouth 2 (two) times daily.    IPRATROPIUM-ALBUTEROL (DUONEB) 0.5-2.5 (3) MG/3ML SOLN    Take 3 mLs by nebulization every 6 (six) hours as needed (shortness of breathe/wheezing).   ISOSORBIDE MONONITRATE (ISMO,MONOKET) 20 MG TABLET    Take 60 mg by mouth daily.    LOSARTAN (COZAAR) 25 MG TABLET    Take 1 tablet (25 mg total) by mouth daily.   NITROGLYCERIN (NITROSTAT) 0.4 MG SL TABLET    Place 0.4 mg under the tongue every 5 (five) minutes as needed for chest pain.   OMEPRAZOLE (PRILOSEC) 20 MG CAPSULE    Take 30 mg by mouth daily.    PAROXETINE (PAXIL) 20 MG TABLET    Take 30 mg by mouth daily.    POLYVINYL ALCOHOL (LIQUIFILM TEARS) 1.4 % OPHTHALMIC SOLUTION    Place 2 drops into both eyes 3 (three) times daily as needed for dry eyes.   PREDNISONE (DELTASONE) 20 MG TABLET    Take 1 tablet (20 mg total) by mouth daily with breakfast.   SENNOSIDES-DOCUSATE SODIUM (SENOKOT-S) 8.6-50 MG TABLET    Take 2 tablets by mouth every evening.    TRAMADOL (ULTRAM) 50 MG TABLET     Take one tablet by mouth every 8 hours as needed for pain   VITAMIN D, ERGOCALCIFEROL, (DRISDOL) 50000 UNITS CAPS CAPSULE    Take 50,000 Units by mouth every 30 (thirty) days.  Modified Medications   No medications on file  Discontinued Medications   PRESCRIPTION MEDICATION    Take 60 mLs by mouth 2 (two) times daily. 2 Cal feeding supplement   RANITIDINE (ZANTAC) 150 MG CAPSULE    Take 150 mg by mouth every evening.     SIGNIFICANT DIAGNOSTIC EXAMS  11-06-14: chest x-ray: Probable left lower lobe retrocardiac opacity, atelectasis versuspneumonia. Grossly stable cardiomegaly allowing for differences in technique.   LABS REVIEWED:   07-02-14: wbc 5.7; hgb 11.7; hct 35.0; mcv 77.3; plt 281; glucose 37; bun 23; creat 1.48; k+3.6 ;na++141; liver normal albumin 4.1; tsh 0.487 08-13-14: chol 148; ldl 93; trig 104; hgb a1c 7.1; urine micro-albumin 41.5  10-31-14: glucose 100 bun 14; creat 1.38; k+3.7; na++140; vit d 31  11-06-14: wbc 7.3; hgb 12.1; hct 38.5; mcv 81.9; plt 227; glucose 108; bun 15; creat 1.42; k+3.3; na++142 11-12-14: wbc 5.5; hgb 11.5; hct 36.4; mcv 81.4; plt 237; glucose 50; bun 21; creat 1.4; k+4.2; na++140; liver normal albumin 3.5; tsh 0.487; hgb a1c 6.1  03-11-15: wbc 5.2; hgb 11.9; hct 37.2; mcv 81; plt 287; chol 198; ldl 127; trig 122; hdl 42; hgb a1c 6.7;  vit d 24     Review of Systems  Constitutional: Negative for appetite change and fatigue.  HENT: Negative for congestion.   Respiratory: Negative for cough, chest tightness and shortness of breath.   Cardiovascular: Negative for chest pain, palpitations and leg swelling.  Gastrointestinal: Negative for nausea, abdominal pain, diarrhea and constipation.  Musculoskeletal: Negative for myalgias and arthralgias.  Skin: Negative for pallor.  Neurological: Negative for dizziness.  Psychiatric/Behavioral: The patient is not nervous/anxious.       Physical Exam  Constitutional: No distress.  Eyes: Conjunctivae are  normal.  Neck: Neck supple. No JVD present. No thyromegaly present.  Cardiovascular: Normal rate, regular rhythm and intact distal pulses.   Respiratory: Effort normal and breath sounds normal. No respiratory distress. She has no wheezes.  GI: Soft. Bowel sounds are normal. She exhibits no distension. There is no tenderness.  Has colostomy   Musculoskeletal: She exhibits no edema.  Able to move all extremities   Lymphadenopathy:    She has no cervical adenopathy.  Neurological: She is alert.  Skin: Skin is warm and dry. She is not diaphoretic.  Psychiatric: She has a normal mood and affect.       ASSESSMENT/ PLAN:  1. Hypertension: : will continue norvasc 10 mg daily; cozaar 25 mg daily; hydralazine 25 mg twice daily  coreg 3.125 mg twice daily and will monitor  2. Dyslipidemia: will continue lipitor 20 mg daily ldl is 126  3. Diabetes: is presently stable will continue glipizide 2.5 mg daily hgb a1c is 6.7  4. COPD: is stable will continue duoneb every 6 hours as needed; will continue prednisone 5 mg daily and will monitor his status.   5. CAD: no complaints of chest pain present will continue imdur 60 mg daily; asa 81 mg daily and prn ntg; will monitor  6. Jerrye Bushy: will continue  prilosec 20 mg daily    7. Constipation: will continue colace three times daily; and senna s 2 tabs daily   8. Depression: will continue paxil 30 mg daily   9.  CVA: is neurologically stable; will continue asa 81 mg daily   10. Vascular dementia: on change in her status; is presently not on medications; will not make changes will monitor her status.   11. Diastolic heart failure: will continue coreg 3.125 mg twice daily; hydralazine 25 mg twice daily imdur 60 mg daily and will monitor her status.    Ok Edwards NP [facility]  Contact [phone] Monday through Friday 8am- 5pm  After hours call [phone]

## 2015-06-25 DIAGNOSIS — E1159 Type 2 diabetes mellitus with other circulatory complications: Secondary | ICD-10-CM | POA: Diagnosis not present

## 2015-06-25 DIAGNOSIS — M79675 Pain in left toe(s): Secondary | ICD-10-CM | POA: Diagnosis not present

## 2015-06-25 DIAGNOSIS — B351 Tinea unguium: Secondary | ICD-10-CM | POA: Diagnosis not present

## 2015-06-25 DIAGNOSIS — M79674 Pain in right toe(s): Secondary | ICD-10-CM | POA: Diagnosis not present

## 2015-07-19 ENCOUNTER — Non-Acute Institutional Stay (SKILLED_NURSING_FACILITY): Payer: Medicare Other | Admitting: Adult Health

## 2015-07-19 DIAGNOSIS — E1159 Type 2 diabetes mellitus with other circulatory complications: Secondary | ICD-10-CM | POA: Diagnosis not present

## 2015-07-19 DIAGNOSIS — F329 Major depressive disorder, single episode, unspecified: Secondary | ICD-10-CM | POA: Diagnosis not present

## 2015-07-19 DIAGNOSIS — K219 Gastro-esophageal reflux disease without esophagitis: Secondary | ICD-10-CM

## 2015-07-19 DIAGNOSIS — K5900 Constipation, unspecified: Secondary | ICD-10-CM

## 2015-07-19 DIAGNOSIS — I129 Hypertensive chronic kidney disease with stage 1 through stage 4 chronic kidney disease, or unspecified chronic kidney disease: Secondary | ICD-10-CM

## 2015-07-19 DIAGNOSIS — I130 Hypertensive heart and chronic kidney disease with heart failure and stage 1 through stage 4 chronic kidney disease, or unspecified chronic kidney disease: Secondary | ICD-10-CM

## 2015-07-19 DIAGNOSIS — K5909 Other constipation: Secondary | ICD-10-CM

## 2015-07-19 DIAGNOSIS — F0151 Vascular dementia with behavioral disturbance: Secondary | ICD-10-CM

## 2015-07-19 DIAGNOSIS — J438 Other emphysema: Secondary | ICD-10-CM | POA: Diagnosis not present

## 2015-07-19 DIAGNOSIS — F0280 Dementia in other diseases classified elsewhere without behavioral disturbance: Secondary | ICD-10-CM

## 2015-07-19 DIAGNOSIS — N189 Chronic kidney disease, unspecified: Secondary | ICD-10-CM | POA: Diagnosis not present

## 2015-07-19 DIAGNOSIS — E1151 Type 2 diabetes mellitus with diabetic peripheral angiopathy without gangrene: Secondary | ICD-10-CM

## 2015-07-19 DIAGNOSIS — E785 Hyperlipidemia, unspecified: Secondary | ICD-10-CM | POA: Diagnosis not present

## 2015-07-19 DIAGNOSIS — F0393 Unspecified dementia, unspecified severity, with mood disturbance: Secondary | ICD-10-CM

## 2015-07-19 DIAGNOSIS — I509 Heart failure, unspecified: Secondary | ICD-10-CM | POA: Diagnosis not present

## 2015-07-19 DIAGNOSIS — F0153 Vascular dementia, unspecified severity, with mood disturbance: Secondary | ICD-10-CM

## 2015-07-22 DIAGNOSIS — E119 Type 2 diabetes mellitus without complications: Secondary | ICD-10-CM | POA: Diagnosis not present

## 2015-07-30 DIAGNOSIS — H04123 Dry eye syndrome of bilateral lacrimal glands: Secondary | ICD-10-CM | POA: Diagnosis not present

## 2015-07-30 DIAGNOSIS — H2513 Age-related nuclear cataract, bilateral: Secondary | ICD-10-CM | POA: Diagnosis not present

## 2015-07-30 DIAGNOSIS — E119 Type 2 diabetes mellitus without complications: Secondary | ICD-10-CM | POA: Diagnosis not present

## 2015-08-01 IMAGING — US US RENAL
1 series · 14 of 25 positions shown · non-contrast
Comparison: CT 01/07/2015.

CLINICAL DATA: Diabetes.  Hypertension.

EXAM:
RENAL/URINARY TRACT ULTRASOUND COMPLETE

[Series 1: us renal · 0.20mm/px · 14 of 34 slices shown]
[im 1/34]
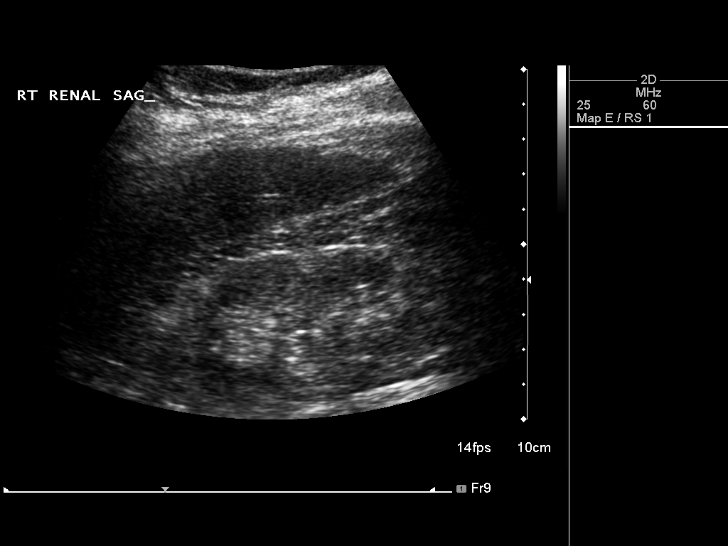
[im 3/34]
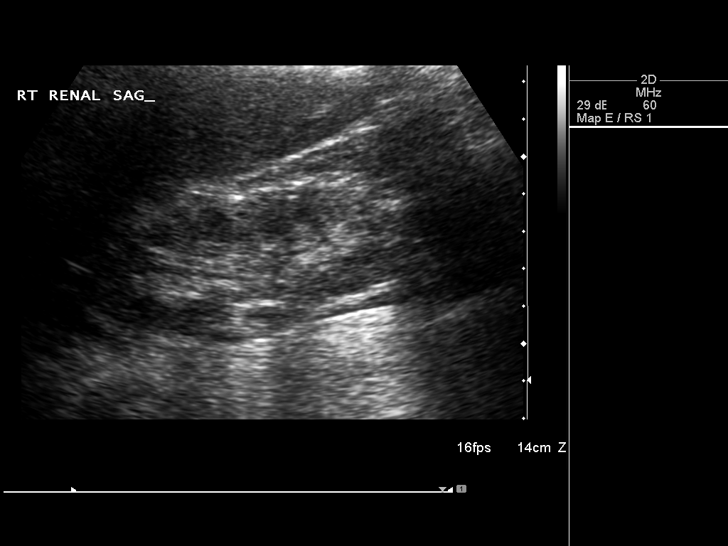
[im 6/34]
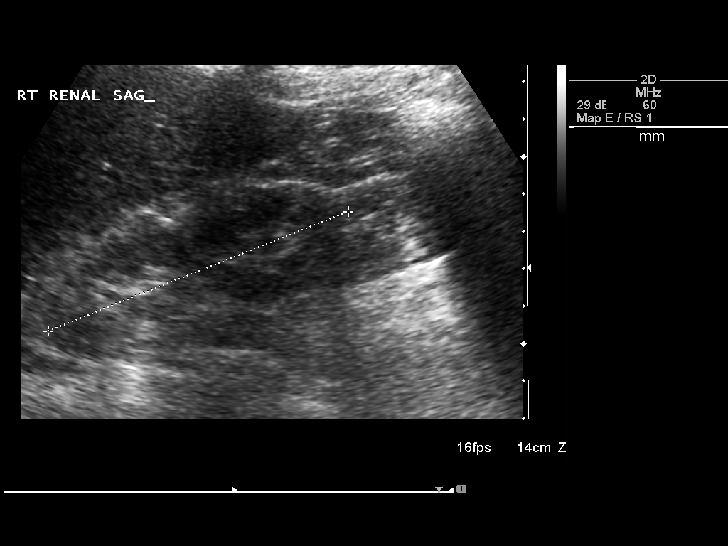
[im 9/34]
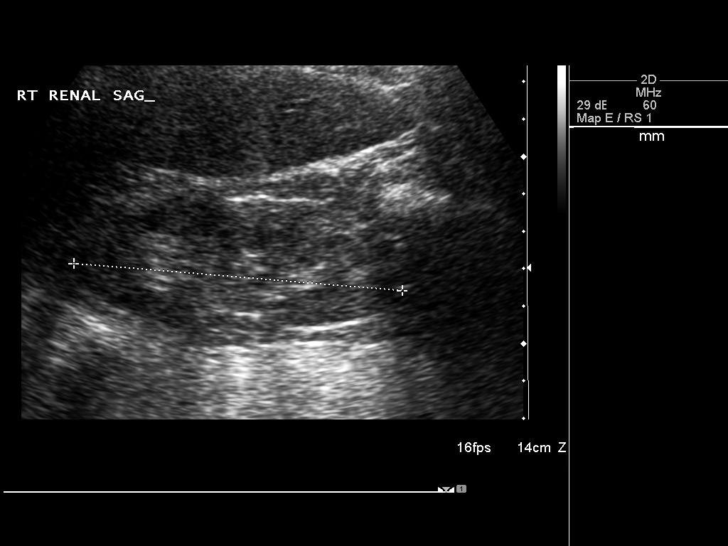
[im 12/34]
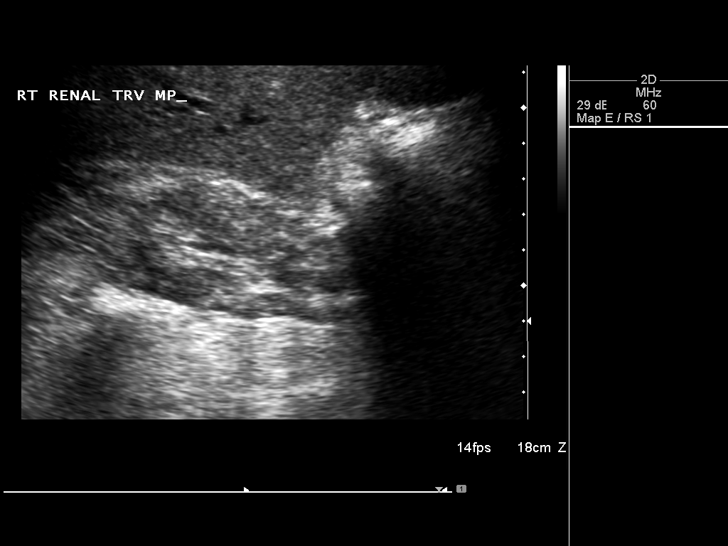
[im 13/34]
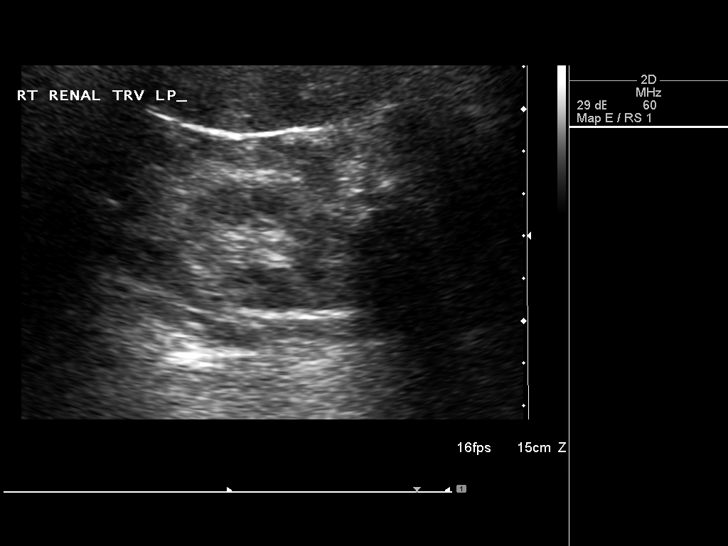
[im 16/34]
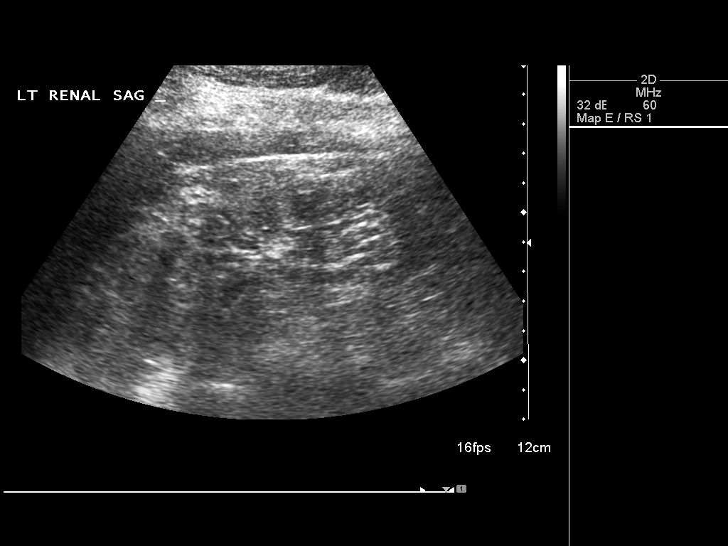
[im 18/34]
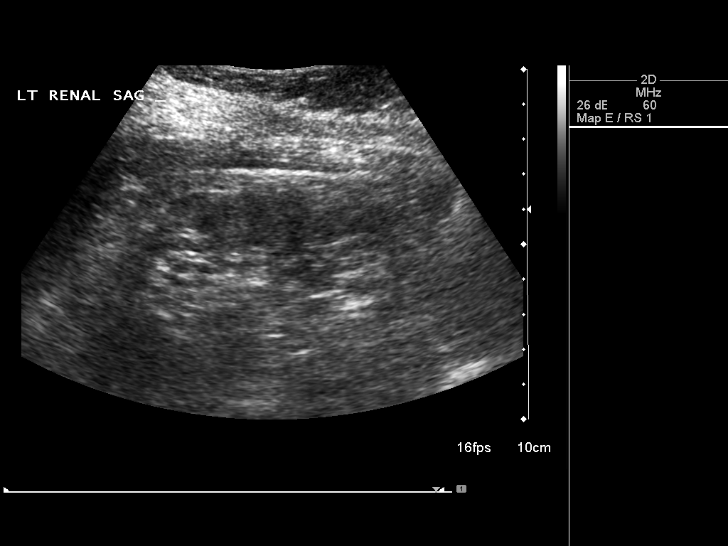
[im 21/34]
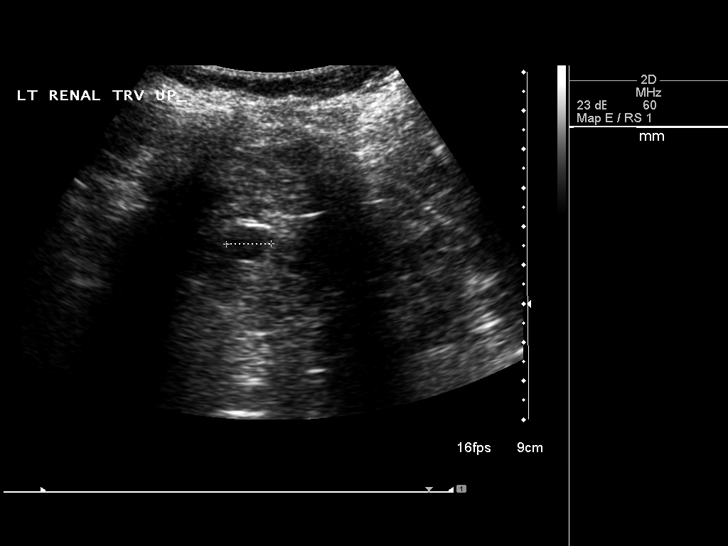
[im 23/34]
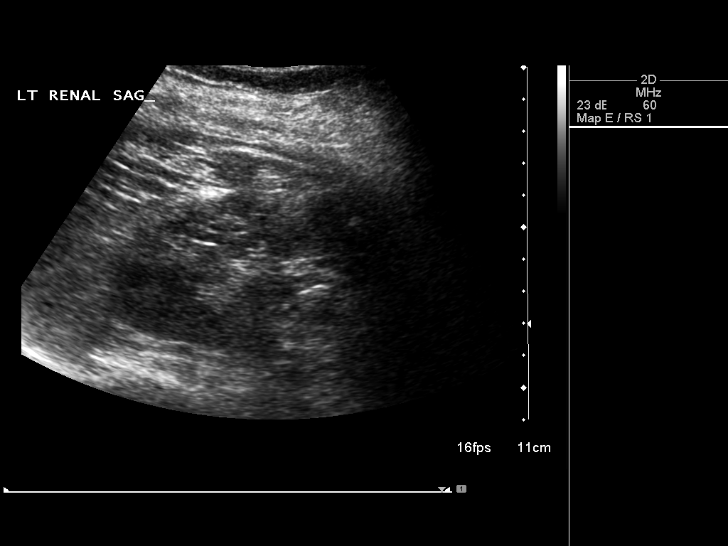
[im 25/34]
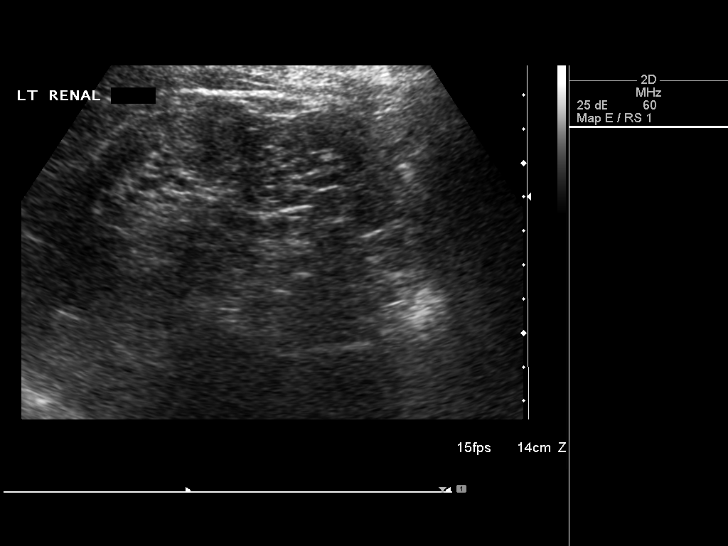
[im 28/34]
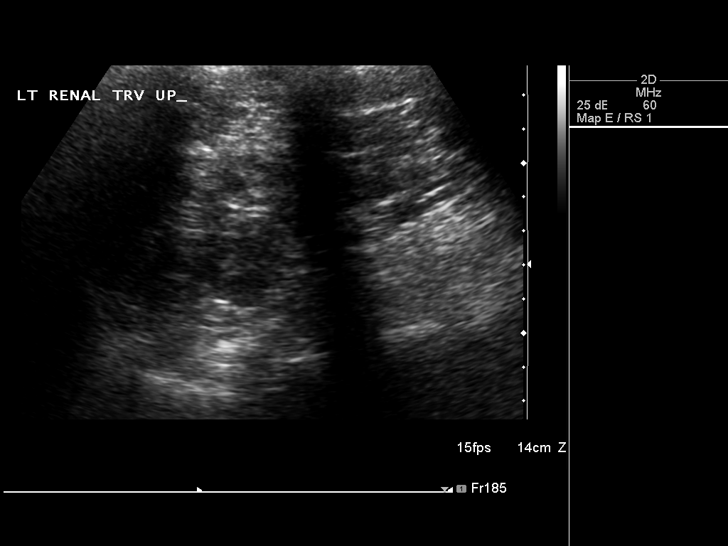
[im 31/34]
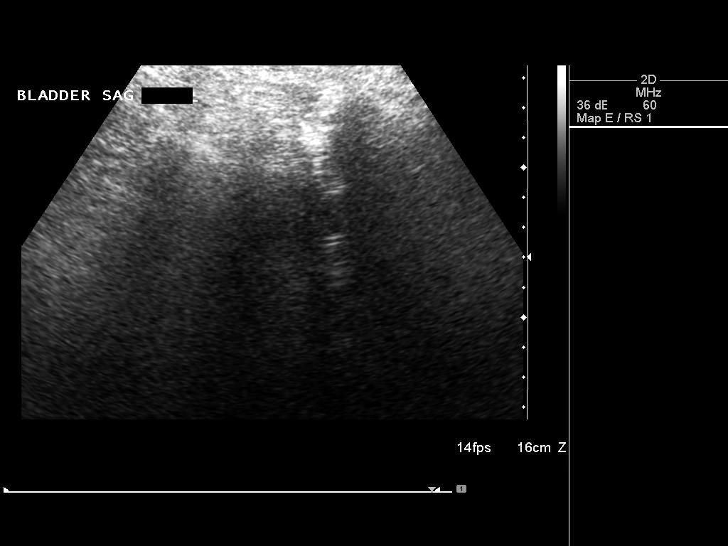
[im 34/34]
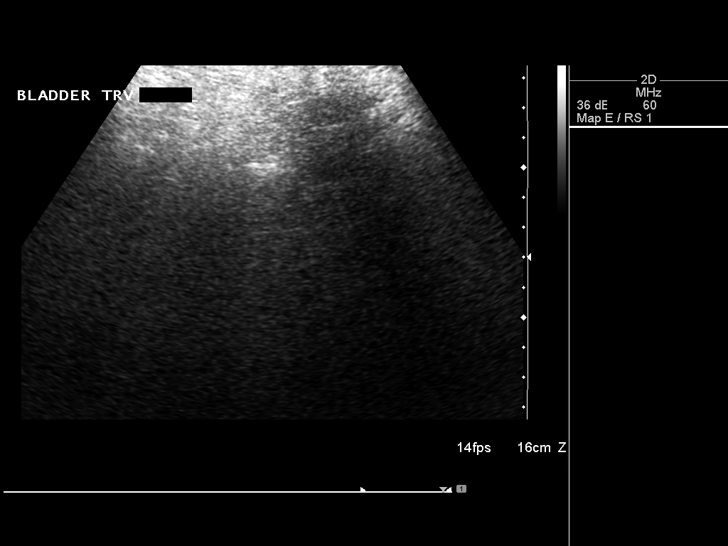

[14 of 25 positions shown; findings below may reference images not displayed]

FINDINGS: Right Kidney:

Length: 8.8 cm. Cortical thinning and increased echogenicity
consistent with chronic renal disease. No hydronephrosis. No mass.

Left Kidney:

Length: 10.3 cm. Cortical thinning and increased echogenicity
consistent with chronic renal disease. No hydronephrosis. 1.2 cm
hypoechoic lesion left upper renal pole. This appears to represent a
simple cyst by ultrasound. Reference is made to prior CT report
01/07/2015.

Bladder:

Bladder is empty.
IMPRESSION: 1. Bilateral renal cortical thinning and increased echogenicity
consistent with chronic medical renal disease.

2. 1.2 cm simple cyst left upper renal pole. No definite solid
lesion is identified however however given the recent CT findings of
an enlarging enhancing 2.8 cm lesion in the left kidney, renal MRI
is suggested to further evaluate.

## 2015-08-09 DIAGNOSIS — Z23 Encounter for immunization: Secondary | ICD-10-CM | POA: Diagnosis not present

## 2015-08-26 ENCOUNTER — Encounter: Payer: Self-pay | Admitting: Adult Health

## 2015-08-26 DIAGNOSIS — I129 Hypertensive chronic kidney disease with stage 1 through stage 4 chronic kidney disease, or unspecified chronic kidney disease: Secondary | ICD-10-CM | POA: Diagnosis not present

## 2015-08-26 DIAGNOSIS — D631 Anemia in chronic kidney disease: Secondary | ICD-10-CM | POA: Diagnosis not present

## 2015-08-26 DIAGNOSIS — N2581 Secondary hyperparathyroidism of renal origin: Secondary | ICD-10-CM | POA: Diagnosis not present

## 2015-08-26 DIAGNOSIS — E1129 Type 2 diabetes mellitus with other diabetic kidney complication: Secondary | ICD-10-CM | POA: Diagnosis not present

## 2015-08-26 DIAGNOSIS — N183 Chronic kidney disease, stage 3 (moderate): Secondary | ICD-10-CM | POA: Diagnosis not present

## 2015-08-26 MED ORDER — ATORVASTATIN CALCIUM 80 MG PO TABS: 80.0000 mg | ORAL_TABLET | Freq: Every day | ORAL | Status: DC

## 2015-08-26 MED ORDER — LOSARTAN POTASSIUM 25 MG PO TABS: 50.0000 mg | ORAL_TABLET | Freq: Every day | ORAL | Status: DC

## 2015-08-26 NOTE — Progress Notes (Signed)
Patient ID: Sheila Flynn, female   DOB: 1946-06-16, 69 y.o.   MRN: [id number]    Facility: [facility]      Allergies  Allergen Reactions  . Ace Inhibitors Swelling    Chief Complaint  Patient presents with  . Medical Management of Chronic Issues    HPI:  She is a long term resident of this facility being seen for the management of her chronic illnesses.  Overall her status is without change. She is not voicing any complaints or concerns today. There are no nursing concerns today.   Past Medical History  Diagnosis Date  . 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 Chronic diastolic CHF (congestive heart failure) ([place])   . CAD (coronary artery disease)     Dr. Matthew Saras  . Angina at rest [facility])   . Hyperlipidemia LDL goal < 100   . Aspiration pneumonia ([place])   . COPD (chronic obstructive pulmonary disease) ([place])   . Acute respiratory failure ([place]) 12/21/10  . Pulmonary hypertension ([place])   . Chronic constipation   . Rectal cancer ([place])     s/p colostomy  . Thyroid goiter   . 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 Vascular dementia with depressed mood   . Pneumonia 12/21/10  . Delirium, induced by drug ([place])     polypharmacy  . Sacral fracture, closed ([place])     s/p kyphoplasty  . Fall     history of falls  . Dementia   . GERD (gastroesophageal reflux disease)   . Major depressive disorder ([place])   . CKD (chronic kidney disease)   . Myocardial infarction ([place])     "I've had about 3" (11/06/2014)  . DM (diabetes mellitus) type II controlled peripheral vascular disorder ([place])   . On home oxygen therapy     "prn at the nursing home" (11/06/2014)  . Migraine     "monthly" (11/06/2014)  . Stroke [facility]) 2011    postop  . Stroke [facility]) ?2014    "left side sometimes weaker since" (11/06/2014)    Past Surgical History  Procedure Laterality Date  . Kyphoplasty      sacrum  .  Colostomy      rectal ca  . Appendectomy    . Tonsillectomy    . Abdominal hysterectomy    . Tubal ligation    . Colon surgery      VITAL SIGNS BP 144/77 mmHg  Pulse 87  Ht 5\' 7"  (1.702 m)  Wt 183 lb (83.008 kg)  BMI 28.66 kg/m2  Patient's Medications  New Prescriptions   No medications on file  Previous Medications   ACETAMINOPHEN (TYLENOL) 325 MG TABLET    Take 650 mg by mouth every 6 (six) hours as needed for mild pain.   AMLODIPINE (NORVASC) 10 MG TABLET    Take 10 mg by mouth daily.   ASPIRIN (ASPIRIN EC) 81 MG EC TABLET    Take 81 mg by mouth daily. Swallow whole.   ATORVASTATIN (LIPITOR) 20 MG TABLET    Take 40 mg by mouth at bedtime.    CARVEDILOL (COREG) 3.125 MG TABLET    Take 3.125 mg by mouth 2 (two) times daily with a meal.   DOCUSATE SODIUM (COLACE) 100 MG CAPSULE    Take 100 mg by mouth 3 (three) times daily.   GLIPIZIDE (GLUCOTROL) 5 MG TABLET    Take 0.5 tablets (2.5 mg total) by  mouth daily before breakfast.   HYDRALAZINE (APRESOLINE) 25 MG TABLET    Take 25 mg by mouth 2 (two) times daily.    IPRATROPIUM-ALBUTEROL (DUONEB) 0.5-2.5 (3) MG/3ML SOLN    Take 3 mLs by nebulization every 6 (six) hours as needed (shortness of breathe/wheezing).   ISOSORBIDE MONONITRATE (ISMO,MONOKET) 20 MG TABLET    Take 60 mg by mouth daily.    LOSARTAN (COZAAR) 25 MG TABLET    Take 1 tablet (25 mg total) by mouth daily.   NITROGLYCERIN (NITROSTAT) 0.4 MG SL TABLET    Place 0.4 mg under the tongue every 5 (five) minutes as needed for chest pain.   OMEPRAZOLE (PRILOSEC) 20 MG CAPSULE    Take 20 mg by mouth daily.    PAROXETINE (PAXIL) 20 MG TABLET    Take 30 mg by mouth daily.    POLYVINYL ALCOHOL (LIQUIFILM TEARS) 1.4 % OPHTHALMIC SOLUTION    Place 2 drops into both eyes 3 (three) times daily as needed for dry eyes.   PREDNISONE (DELTASONE) 20 MG TABLET    Take 1 tablet (20 mg total) by mouth daily with breakfast.   SENNOSIDES-DOCUSATE SODIUM (SENOKOT-S) 8.6-50 MG TABLET    Take 2  tablets by mouth every evening.    TRAMADOL (ULTRAM) 50 MG TABLET    Take one tablet by mouth every 8 hours as needed for pain   VITAMIN D, ERGOCALCIFEROL, (DRISDOL) 50000 UNITS CAPS CAPSULE    Take 50,000 Units by mouth every 30 (thirty) days.  Modified Medications   No medications on file  Discontinued Medications   No medications on file     SIGNIFICANT DIAGNOSTIC EXAMS   11-06-14: chest x-ray: Probable left lower lobe retrocardiac opacity, atelectasis versuspneumonia. Grossly stable cardiomegaly allowing for differences in technique.   LABS REVIEWED:   07-02-14: wbc 5.7; hgb 11.7; hct 35.0; mcv 77.3; plt 281; glucose 37; bun 23; creat 1.48; k+3.6 ;na++141; liver normal albumin 4.1; tsh 0.487 08-13-14: chol 148; ldl 93; trig 104; hgb a1c 7.1; urine micro-albumin 41.5  10-31-14: glucose 100 bun 14; creat 1.38; k+3.7; na++140; vit d 31  11-06-14: wbc 7.3; hgb 12.1; hct 38.5; mcv 81.9; plt 227; glucose 108; bun 15; creat 1.42; k+3.3; na++142 11-12-14: wbc 5.5; hgb 11.5; hct 36.4; mcv 81.4; plt 237; glucose 50; bun 21; creat 1.4; k+4.2; na++140; liver normal albumin 3.5; tsh 0.487; hgb a1c 6.1  03-11-15: wbc 5.2; hgb 11.9; hct 37.2; mcv 81; plt 287; chol 198; ldl 127; trig 122; hdl 42; hgb a1c 6.7;  vit d 24 04-22-15: glucose 62; bun 18; creat 1.40; k+ 3.5 ;na++ 141     Review of Systems Constitutional: Negative for appetite change and fatigue.  HENT: Negative for congestion.   Respiratory: Negative for cough, chest tightness and shortness of breath.   Cardiovascular: Negative for chest pain, palpitations and leg swelling.  Gastrointestinal: Negative for nausea, abdominal pain, diarrhea and constipation.  Musculoskeletal: Negative for myalgias and arthralgias.  Skin: Negative for pallor.  Neurological: Negative for dizziness.  Psychiatric/Behavioral: The patient is not nervous/anxious.       Physical Exam Constitutional: No distress.  Eyes: Conjunctivae are normal.  Neck: Neck  supple. No JVD present. No thyromegaly present.  Cardiovascular: Normal rate, regular rhythm and intact distal pulses.   Respiratory: Effort normal and breath sounds normal. No respiratory distress. She has no wheezes.  GI: Soft. Bowel sounds are normal. She exhibits no distension. There is no tenderness.  Has colostomy   Musculoskeletal: She exhibits  no edema.  Able to move all extremities   Lymphadenopathy:    She has no cervical adenopathy.  Neurological: She is alert.  Skin: Skin is warm and dry. She is not diaphoretic.  Psychiatric: She has a normal mood and affect.     ASSESSMENT/ PLAN:  1. Hypertension: : will continue norvasc 10 mg daily;  hydralazine 25 mg twice daily  coreg 3.125 mg twice daily; will increase her cozaar to 50 mg daily and will check bmp in one week  and will monitor  2. Dyslipidemia: will increase lipitor to 80 mg daily will check lipids and liver function in 2 months ldl is 126  3. Diabetes: is presently stable will continue glipizide 2.5 mg daily hgb a1c is 6.7  4. COPD: is stable will continue duoneb every 6 hours as needed; will continue prednisone 5 mg daily and will monitor his status.   5. CAD: no complaints of chest pain present will continue imdur 60 mg daily; asa 81 mg daily and prn ntg; will monitor  6. Jerrye Bushy: will continue  prilosec 20 mg daily    7. Constipation: will continue colace three times daily; and senna s 2 tabs daily   8. Depression: will continue paxil 30 mg daily   9.  CVA: is neurologically stable; will continue asa 81 mg daily   10. Vascular dementia: on change in her status; is presently not on medications; will not make changes will monitor her status.   11. Diastolic heart failure: will continue coreg 3.125 mg twice daily; hydralazine 25 mg twice daily imdur 60 mg daily and will monitor her status.   12. Vit d deficiency: will change vit d to 50,000 units weekly for 3 months then return to monthly      Ok Edwards  NP [facility]  Contact [phone] Monday through Friday 8am- 5pm  After hours call [phone]

## 2015-08-27 DIAGNOSIS — N189 Chronic kidney disease, unspecified: Secondary | ICD-10-CM | POA: Diagnosis not present

## 2015-09-02 ENCOUNTER — Non-Acute Institutional Stay (SKILLED_NURSING_FACILITY): Payer: Medicare Other | Admitting: Adult Health

## 2015-09-02 DIAGNOSIS — F329 Major depressive disorder, single episode, unspecified: Secondary | ICD-10-CM | POA: Diagnosis not present

## 2015-09-02 DIAGNOSIS — I110 Hypertensive heart disease with heart failure: Secondary | ICD-10-CM

## 2015-09-02 DIAGNOSIS — J438 Other emphysema: Secondary | ICD-10-CM | POA: Diagnosis not present

## 2015-09-02 DIAGNOSIS — K219 Gastro-esophageal reflux disease without esophagitis: Secondary | ICD-10-CM

## 2015-09-02 DIAGNOSIS — E785 Hyperlipidemia, unspecified: Secondary | ICD-10-CM | POA: Diagnosis not present

## 2015-09-02 DIAGNOSIS — F0151 Vascular dementia with behavioral disturbance: Secondary | ICD-10-CM

## 2015-09-02 DIAGNOSIS — E1169 Type 2 diabetes mellitus with other specified complication: Secondary | ICD-10-CM

## 2015-09-02 DIAGNOSIS — E1151 Type 2 diabetes mellitus with diabetic peripheral angiopathy without gangrene: Secondary | ICD-10-CM | POA: Diagnosis not present

## 2015-09-02 DIAGNOSIS — K5900 Constipation, unspecified: Secondary | ICD-10-CM

## 2015-09-02 DIAGNOSIS — K5909 Other constipation: Secondary | ICD-10-CM

## 2015-09-02 DIAGNOSIS — I25119 Atherosclerotic heart disease of native coronary artery with unspecified angina pectoris: Secondary | ICD-10-CM

## 2015-09-02 DIAGNOSIS — I5032 Chronic diastolic (congestive) heart failure: Secondary | ICD-10-CM

## 2015-09-02 DIAGNOSIS — F0153 Vascular dementia, unspecified severity, with mood disturbance: Secondary | ICD-10-CM

## 2015-09-03 DIAGNOSIS — G4700 Insomnia, unspecified: Secondary | ICD-10-CM | POA: Diagnosis not present

## 2015-09-03 DIAGNOSIS — F329 Major depressive disorder, single episode, unspecified: Secondary | ICD-10-CM | POA: Diagnosis not present

## 2015-09-04 DIAGNOSIS — E089 Diabetes mellitus due to underlying condition without complications: Secondary | ICD-10-CM | POA: Diagnosis not present

## 2015-09-04 LAB — MICROALBUMIN, URINE
MICROALB UR: 11.3
Microalb, Ur: 11.3

## 2015-09-04 LAB — CBC AND DIFFERENTIAL: Hemoglobin: 7.2 g/dL — AB (ref 12.0–16.0)

## 2015-09-04 LAB — HEMOGLOBIN A1C: Hemoglobin A1C: 7.2

## 2015-09-10 ENCOUNTER — Encounter: Payer: Self-pay | Admitting: Adult Health

## 2015-09-10 NOTE — Progress Notes (Signed)
Patient ID: Sheila Flynn, female   DOB: [birth year], 70 y.o.   MRN: [id number]    Facility: [facility]      Allergies  Allergen Reactions  . Ace Inhibitors Swelling    Chief Complaint  Patient presents with  . Medical Management of Chronic Issues    HPI:  She is a long term resident of this facility being seen for the management of her chronic illnesses. Overall her status remains without change. She is not voicing any complaints or concerns states that she is feeling good. There are no nursing concerns at this time   Past Medical History  Diagnosis Date  . 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 Chronic diastolic CHF (congestive heart failure) ([place])   . CAD (coronary artery disease)     Dr. Matthew Saras  . Angina at rest [facility])   . Hyperlipidemia LDL goal < 100   . Aspiration pneumonia ([place])   . COPD (chronic obstructive pulmonary disease) ([place])   . Acute respiratory failure ([place]) 12/21/10  . Pulmonary hypertension ([place])   . Chronic constipation   . Rectal cancer ([place])     s/p colostomy  . Thyroid goiter   . 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 Vascular dementia with depressed mood   . Pneumonia 12/21/10  . Delirium, induced by drug ([place])     polypharmacy  . Sacral fracture, closed ([place])     s/p kyphoplasty  . Fall     history of falls  . Dementia   . GERD (gastroesophageal reflux disease)   . Major depressive disorder ([place])   . CKD (chronic kidney disease)   . Myocardial infarction ([place])     "I've had about 3" (11/06/2014)  . DM (diabetes mellitus) type II controlled peripheral vascular disorder ([place])   . On home oxygen therapy     "prn at the nursing home" (11/06/2014)  . Migraine     "monthly" (11/06/2014)  . Stroke [facility]) 2011    postop  . Stroke [facility]) ?2014    "left side sometimes weaker since" (11/06/2014)    Past Surgical History  Procedure Laterality  Date  . Kyphoplasty      sacrum  . Colostomy      rectal ca  . Appendectomy    . Tonsillectomy    . Abdominal hysterectomy    . Tubal ligation    . Colon surgery      VITAL SIGNS BP 140/67 mmHg  Pulse 84  Ht 5\' 7"  (1.702 m)  Wt 186 lb (84.369 kg)  BMI 29.12 kg/m2  Patient's Medications  New Prescriptions   No medications on file  Previous Medications   ACETAMINOPHEN (TYLENOL) 325 MG TABLET    Take 650 mg by mouth every 6 (six) hours as needed for mild pain.   AMLODIPINE (NORVASC) 10 MG TABLET    Take 10 mg by mouth daily.   ASPIRIN (ASPIRIN EC) 81 MG EC TABLET    Take 81 mg by mouth daily. Swallow whole.   ATORVASTATIN (LIPITOR) 80 MG TABLET    Take 1 tablet (80 mg total) by mouth daily.   CARVEDILOL (COREG) 3.125 MG TABLET    Take 3.125 mg by mouth 2 (two) times daily with a meal.   DOCUSATE SODIUM (COLACE) 100 MG CAPSULE    Take 100 mg by mouth 3 (three) times daily.   GLIPIZIDE (GLUCOTROL) 5 MG TABLET  Take 0.5 tablets (2.5 mg total) by mouth daily before breakfast.   HYDRALAZINE (APRESOLINE) 25 MG TABLET    Take 25 mg by mouth 2 (two) times daily.    IPRATROPIUM-ALBUTEROL (DUONEB) 0.5-2.5 (3) MG/3ML SOLN    Take 3 mLs by nebulization every 6 (six) hours as needed (shortness of breathe/wheezing).   ISOSORBIDE MONONITRATE (ISMO,MONOKET) 20 MG TABLET    Take 60 mg by mouth daily.    LOSARTAN (COZAAR) 25 MG TABLET    Take 2 tablets (50 mg total) by mouth daily.   NITROGLYCERIN (NITROSTAT) 0.4 MG SL TABLET    Place 0.4 mg under the tongue every 5 (five) minutes as needed for chest pain.   OMEPRAZOLE (PRILOSEC) 20 MG CAPSULE    Take 20 mg by mouth daily.    PAROXETINE (PAXIL) 20 MG TABLET    Take 30 mg by mouth daily.    PREDNISONE (DELTASONE) 20 MG TABLET    Take 1 tablet (20 mg total) by mouth daily with breakfast.   SENNOSIDES-DOCUSATE SODIUM (SENOKOT-S) 8.6-50 MG TABLET    Take 2 tablets by mouth every evening.    TRAMADOL (ULTRAM) 50 MG TABLET    Take one tablet by mouth  every 8 hours as needed for pain   VITAMIN D, ERGOCALCIFEROL, (DRISDOL) 50000 UNITS CAPS CAPSULE    Take 50,000 Units by mouth every 30 (thirty) days.  Modified Medications   No medications on file  Discontinued Medications     SIGNIFICANT DIAGNOSTIC EXAMS  11-06-14: chest x-ray: Probable left lower lobe retrocardiac opacity, atelectasis versuspneumonia. Grossly stable cardiomegaly allowing for differences in technique.   LABS REVIEWED:   08-13-14: chol 148; ldl 93; trig 104; hgb a1c 7.1; urine micro-albumin 41.5  10-31-14: glucose 100 bun 14; creat 1.38; k+3.7; na++140; vit d 31  11-06-14: wbc 7.3; hgb 12.1; hct 38.5; mcv 81.9; plt 227; glucose 108; bun 15; creat 1.42; k+3.3; na++142 11-12-14: wbc 5.5; hgb 11.5; hct 36.4; mcv 81.4; plt 237; glucose 50; bun 21; creat 1.4; k+4.2; na++140; liver normal albumin 3.5; tsh 0.487; hgb a1c 6.1  03-11-15: wbc 5.2; hgb 11.9; hct 37.2; mcv 81; plt 287; chol 198; ldl 127; trig 122; hdl 42; hgb a1c 6.7;  vit d 24 04-22-15: glucose 62; bun 18; creat 1.40; k+ 3.5 ;na++ 141       Review of Systems Constitutional: Negative for appetite change and fatigue.  HENT: Negative for congestion.   Respiratory: Negative for cough, chest tightness and shortness of breath.   Cardiovascular: Negative for chest pain, palpitations and leg swelling.  Gastrointestinal: Negative for nausea, abdominal pain, diarrhea and constipation.  Musculoskeletal: Negative for myalgias and arthralgias.  Skin: Negative for pallor.  Neurological: Negative for dizziness.  Psychiatric/Behavioral: The patient is not nervous/anxious.       Physical Exam Constitutional: No distress.  Eyes: Conjunctivae are normal.  Neck: Neck supple. No JVD present. No thyromegaly present.  Cardiovascular: Normal rate, regular rhythm and intact distal pulses.   Respiratory: Effort normal and breath sounds normal. No respiratory distress. She has no wheezes.  GI: Soft. Bowel sounds are normal. She  exhibits no distension. There is no tenderness.  Has colostomy   Musculoskeletal: She exhibits no edema.  Able to move all extremities   Lymphadenopathy:    She has no cervical adenopathy.  Neurological: She is alert.  Skin: Skin is warm and dry. She is not diaphoretic.  Psychiatric: She has a normal mood and affect.      ASSESSMENT/ PLAN:  1. Hypertension: : will continue norvasc 10 mg daily;  hydralazine 25 mg twice daily  coreg 3.125 mg twice daily; will continue cozaar  50 mg daily   2. Dyslipidemia: will continue lipitor  80 mg daily  ldl is 126  3. Diabetes: is presently stable will continue glipizide 2.5 mg daily hgb a1c is 6.7  4. COPD: is stable will continue duoneb every 6 hours as needed; will continue prednisone 5 mg daily and will monitor his status.   5. CAD: no complaints of chest pain present will continue imdur 60 mg daily; asa 81 mg daily and prn ntg; will monitor  6. Jerrye Bushy: will continue  prilosec 20 mg daily    7. Constipation: will continue colace three times daily; and senna s 2 tabs daily   8. Depression: will continue paxil 30 mg daily   9.  CVA: is neurologically stable; will continue asa 81 mg daily   10. Vascular dementia: on change in her status; is presently not on medications; will not make changes will monitor her status.   11. Diastolic heart failure: will continue coreg 3.125 mg twice daily; hydralazine 25 mg twice daily imdur 60 mg daily and will monitor her status.   12. Vit d deficiency: will continue vit d  50,000 units weekly y   Will check hgb a1c     Ok Edwards NP [facility]  Contact [phone] Monday through Friday 8am- 5pm  After hours call [phone]

## 2015-09-18 DIAGNOSIS — G4700 Insomnia, unspecified: Secondary | ICD-10-CM | POA: Diagnosis not present

## 2015-09-18 DIAGNOSIS — F329 Major depressive disorder, single episode, unspecified: Secondary | ICD-10-CM | POA: Diagnosis not present

## 2015-09-23 DIAGNOSIS — N189 Chronic kidney disease, unspecified: Secondary | ICD-10-CM | POA: Diagnosis not present

## 2015-09-23 DIAGNOSIS — E089 Diabetes mellitus due to underlying condition without complications: Secondary | ICD-10-CM | POA: Diagnosis not present

## 2015-09-23 LAB — BASIC METABOLIC PANEL
BUN: 21 mg/dL (ref 4–21)
CREATININE: 1.4 mg/dL — AB (ref 0.5–1.1)
Glucose: 51 mg/dL
POTASSIUM: 3.7 mmol/L (ref 3.4–5.3)
SODIUM: 144 mmol/L (ref 137–147)

## 2015-10-09 DIAGNOSIS — I2510 Atherosclerotic heart disease of native coronary artery without angina pectoris: Secondary | ICD-10-CM | POA: Diagnosis not present

## 2015-10-09 DIAGNOSIS — B351 Tinea unguium: Secondary | ICD-10-CM | POA: Diagnosis not present

## 2015-10-09 DIAGNOSIS — M79672 Pain in left foot: Secondary | ICD-10-CM | POA: Diagnosis not present

## 2015-10-09 DIAGNOSIS — G4700 Insomnia, unspecified: Secondary | ICD-10-CM | POA: Diagnosis not present

## 2015-10-09 DIAGNOSIS — M79671 Pain in right foot: Secondary | ICD-10-CM | POA: Diagnosis not present

## 2015-10-09 DIAGNOSIS — F329 Major depressive disorder, single episode, unspecified: Secondary | ICD-10-CM | POA: Diagnosis not present

## 2015-10-14 DIAGNOSIS — F0280 Dementia in other diseases classified elsewhere without behavioral disturbance: Secondary | ICD-10-CM | POA: Diagnosis not present

## 2015-10-14 DIAGNOSIS — F329 Major depressive disorder, single episode, unspecified: Secondary | ICD-10-CM | POA: Diagnosis not present

## 2015-10-14 DIAGNOSIS — N183 Chronic kidney disease, stage 3 (moderate): Secondary | ICD-10-CM | POA: Diagnosis not present

## 2015-10-14 DIAGNOSIS — I5031 Acute diastolic (congestive) heart failure: Secondary | ICD-10-CM | POA: Diagnosis not present

## 2015-10-14 DIAGNOSIS — I10 Essential (primary) hypertension: Secondary | ICD-10-CM | POA: Diagnosis not present

## 2015-10-15 ENCOUNTER — Encounter: Payer: Self-pay | Admitting: Internal Medicine

## 2015-10-15 ENCOUNTER — Non-Acute Institutional Stay (SKILLED_NURSING_FACILITY): Payer: Medicare Other | Admitting: Internal Medicine

## 2015-10-15 DIAGNOSIS — F0151 Vascular dementia with behavioral disturbance: Secondary | ICD-10-CM | POA: Diagnosis not present

## 2015-10-15 DIAGNOSIS — Z933 Colostomy status: Secondary | ICD-10-CM | POA: Diagnosis not present

## 2015-10-15 DIAGNOSIS — F0280 Dementia in other diseases classified elsewhere without behavioral disturbance: Secondary | ICD-10-CM | POA: Diagnosis not present

## 2015-10-15 DIAGNOSIS — F0153 Vascular dementia, unspecified severity, with mood disturbance: Secondary | ICD-10-CM

## 2015-10-15 DIAGNOSIS — K219 Gastro-esophageal reflux disease without esophagitis: Secondary | ICD-10-CM | POA: Diagnosis not present

## 2015-10-15 DIAGNOSIS — J438 Other emphysema: Secondary | ICD-10-CM | POA: Diagnosis not present

## 2015-10-15 DIAGNOSIS — G4700 Insomnia, unspecified: Secondary | ICD-10-CM | POA: Diagnosis not present

## 2015-10-15 DIAGNOSIS — E1151 Type 2 diabetes mellitus with diabetic peripheral angiopathy without gangrene: Secondary | ICD-10-CM | POA: Diagnosis not present

## 2015-10-15 DIAGNOSIS — I10 Essential (primary) hypertension: Secondary | ICD-10-CM

## 2015-10-15 DIAGNOSIS — I5032 Chronic diastolic (congestive) heart failure: Secondary | ICD-10-CM

## 2015-10-15 DIAGNOSIS — N183 Chronic kidney disease, stage 3 (moderate): Secondary | ICD-10-CM | POA: Diagnosis not present

## 2015-10-15 DIAGNOSIS — F329 Major depressive disorder, single episode, unspecified: Secondary | ICD-10-CM

## 2015-10-15 DIAGNOSIS — I5031 Acute diastolic (congestive) heart failure: Secondary | ICD-10-CM | POA: Diagnosis not present

## 2015-10-15 NOTE — Progress Notes (Signed)
Patient ID: Sheila Flynn, female   DOB: [birth year], 69 y.o.   MRN: [id number]    DATE:10/15/15  Location:  [place] of Service: SNF [phone])   Extended Emergency Contact Information Primary Emergency Contact: Jeral Pinch Address: [address], [place] Phone: [phone] Relation: Daughter Secondary Emergency Contact: Carlyle Basques Address: [address] [place], [place] [address] Johnnette Litter of [employer] Phone: [phone] Relation: Grandaughter  Advanced Directive information  FULL CODE  Chief Complaint  Patient presents with  . Medical Management of Chronic Issues    HPI:  69 yo female long term resident seen today for f/u. She c/o insomnia on trazodone 30m qhs. She is followed by psych services. No longer on melatonin. No other concerns. She is a poor historian due to dementia. Hx obtained from chart. Labs 09/23/15 shows Na 144, K+ 3.7, Cr 1.41 with albumin 3.8. A1c 7.2% on 09/04/15. No nursing issues. No falls  Hypertension - stable on norvasc 10 mg daily;  hydralazine 25 mg twice daily  coreg 3.125 mg twice daily;  cozaar  50 mg daily   Hyperlipidemia - stable on lipitor 80 mg daily. LDL 126  DM -  stable on glipizide 2.5 mg daily. A1c 7.2%. CBG 98  COPD - stable on duoneb every 6 hours as needed; prednisone 5 mg daily. No exacerbations  CAD - currently asymptomatic. Takes imdur 60 mg daily; asa 81 mg daily and has not req'd prn ntg  GERD - controlled on prilosec 20 mg daily. Constipation stable on colace three times daily; senna s 2 tabs daily   Depression - mood stable on paxil 30 mg daily. Followed by psych services.  Hx CVA - stable; takes asa 81 mg daily   Vascular dementia - stable. She does not take any medications  Diastolic heart failure - stable on coreg 3.125 mg twice daily; hydralazine 25 mg twice daily; imdur 60 mg daily; losartan 557m daily  Vit d deficiency - stable on vit d  50,000 units weekly   Past Medical History  Diagnosis Date  . 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 Chronic diastolic CHF (congestive heart failure) ([facility]  . CAD (coronary artery disease)     Dr. HoMatthew Saras. Angina at rest ([facility]  . Hyperlipidemia LDL goal < 100   . Aspiration pneumonia ([facility]  . COPD (chronic obstructive pulmonary disease) (HC[facility]  . Acute respiratory failure (HCTustin2/12/12  . Pulmonary hypertension (HCHop[facility]  . Chronic constipation   . Rectal cancer ([facility]    s/p colostomy  . Thyroid goiter   . 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 Vascular dementia with depressed mood   . Pneumonia 12/21/10  . Delirium, induced by drug (HCWhiteville    polypharmacy  . Sacral fracture, closed (HCForest[facility]    s/p kyphoplasty  . Fall     history of falls  . Dementia   . GERD (gastroesophageal reflux disease)   . Major depressive disorder (HCOr[facility]  . CKD (chronic kidney disease)   . Myocardial infarction (H[facility]    "I've had about 3" (11/06/2014)  . DM (diabetes mellitus) type II controlled peripheral vascular disorder (HCTripp  . On home oxygen therapy     "  prn at the nursing home" (11/06/2014)  . Migraine     "monthly" (11/06/2014)  . Stroke [facility]) 2011    postop  . Stroke [facility]) ?2014    "left side sometimes weaker since" (11/06/2014)    Past Surgical History  Procedure Laterality Date  . Kyphoplasty      sacrum  . Colostomy      rectal ca  . Appendectomy    . Tonsillectomy    . Abdominal hysterectomy    . Tubal ligation    . Colon surgery      Patient Care Team: Gildardo Cranker, DO as PCP - General (Internal Medicine) Gerlene Fee, NP as Nurse Practitioner (Nurse Practitioner)  Social History   Social History  . Marital Status: Widowed    Spouse Name: N/A  . Number of Children: N/A  . Years of Education: N/A   Occupational History  .       custodial   Social History Main Topics  . Smoking status: Former Smoker -- 0.50 packs/day for 2 years    Types: Cigarettes  . Smokeless tobacco: Never Used     Comment: "quit smoking cigarettes in 1969"  . Alcohol Use: No  . Drug Use: Yes     Comment: previously smoked crack, has had positive UDS for amphetamines and narcotics  . Sexual Activity: No   Other Topics Concern  . Not on file   Social History Narrative   Born in [place], has some high school education, retired from custodian job, divorced, has children     reports that she has quit smoking. Her smoking use included Cigarettes. She has a 1 pack-year smoking history. She has never used smokeless tobacco. She reports that she uses illicit drugs. She reports that she does not drink alcohol.  Immunization History  Administered Date(s) Administered  . Influenza-Unspecified 08/09/2015    Allergies  Allergen Reactions  . Ace Inhibitors Swelling    Medications: Patient's Medications  New Prescriptions   No medications on file  Previous Medications   ACETAMINOPHEN (TYLENOL) 325 MG TABLET    Take 650 mg by mouth every 6 (six) hours as needed for mild pain.   AMLODIPINE (NORVASC) 10 MG TABLET    Take 10 mg by mouth daily.   ASPIRIN (ASPIRIN EC) 81 MG EC TABLET    Take 81 mg by mouth daily. Swallow whole.   ATORVASTATIN (LIPITOR) 80 MG TABLET    Take 1 tablet (80 mg total) by mouth daily.   CARVEDILOL (COREG) 3.125 MG TABLET    Take 3.125 mg by mouth 2 (two) times daily with a meal.   DOCUSATE SODIUM (COLACE) 100 MG CAPSULE    Take 100 mg by mouth 3 (three) times daily.   GLIPIZIDE (GLUCOTROL) 5 MG TABLET    Take 0.5 tablets (2.5 mg total) by mouth daily before breakfast.   HYDRALAZINE (APRESOLINE) 25 MG TABLET    Take 25 mg by mouth 2 (two) times daily.    IPRATROPIUM-ALBUTEROL (DUONEB) 0.5-2.5 (3) MG/3ML SOLN    Take 3 mLs by nebulization every 6 (six) hours as needed (shortness of breathe/wheezing).   ISOSORBIDE  MONONITRATE (ISMO,MONOKET) 20 MG TABLET    Take 60 mg by mouth daily.    LOSARTAN (COZAAR) 25 MG TABLET    Take 2 tablets (50 mg total) by mouth daily.   NITROGLYCERIN (NITROSTAT) 0.4 MG SL TABLET    Place 0.4 mg under the tongue every 5 (five) minutes as needed for chest pain.  OMEPRAZOLE (PRILOSEC) 20 MG CAPSULE    Take 20 mg by mouth daily.    PAROXETINE (PAXIL) 20 MG TABLET    Take 30 mg by mouth daily.    PREDNISONE (DELTASONE) 20 MG TABLET    Take 1 tablet (20 mg total) by mouth daily with breakfast.   SENNOSIDES-DOCUSATE SODIUM (SENOKOT-S) 8.6-50 MG TABLET    Take 2 tablets by mouth every evening.    TRAMADOL (ULTRAM) 50 MG TABLET    Take one tablet by mouth every 8 hours as needed for pain   VITAMIN D, ERGOCALCIFEROL, (DRISDOL) 50000 UNITS CAPS CAPSULE    Take 50,000 Units by mouth every 30 (thirty) days.  Modified Medications   No medications on file  Discontinued Medications   No medications on file    Review of Systems  Unable to perform ROS: Dementia    Filed Vitals:   10/15/15 1448  BP: 150/78  Pulse: 70  Temp: 97.3 F (36.3 C)  Weight: 182 lb (82.555 kg)  SpO2: 98%   Body mass index is 28.5 kg/(m^2).  Physical Exam  Constitutional: She appears well-developed and well-nourished.  Sitting in w/c in NAD  HENT:  Mouth/Throat: Oropharynx is clear and moist. No oropharyngeal exudate.  Eyes: Pupils are equal, round, and reactive to light. No scleral icterus.  Neck: Neck supple. Carotid bruit is not present. No tracheal deviation present.  Cardiovascular: Normal rate, regular rhythm and intact distal pulses.  Exam reveals no gallop and no friction rub.   Murmur (2/6 SEM) heard. No LE edema b/l. no calf TTP.   Pulmonary/Chest: Effort normal and breath sounds normal. No stridor. No respiratory distress. She has no wheezes. She has no rales.  Abdominal: Soft. Bowel sounds are normal. She exhibits no distension and no mass. There is no hepatomegaly. There is no tenderness.  There is no rebound and no guarding.  (+) colostomy  Musculoskeletal: She exhibits edema and tenderness.  (+) joint contractures  Lymphadenopathy:    She has no cervical adenopathy.  Neurological: She is alert.  Skin: Skin is warm and dry. No rash noted.  Psychiatric: She has a normal mood and affect. Her behavior is normal.     Labs reviewed: No visits with results within 3 Month(s) from this visit. Latest known visit with results is:  Admission on 01/07/2015, Discharged on 01/07/2015  Component Date Value Ref Range Status  . WBC 01/07/2015 8.9  4.0 - 10.5 K/uL Final  . RBC 01/07/2015 5.05  3.87 - 5.11 MIL/uL Final  . Hemoglobin 01/07/2015 13.1  12.0 - 15.0 g/dL Final  . HCT 01/07/2015 40.5  36.0 - 46.0 % Final  . MCV 01/07/2015 80.2  78.0 - 100.0 fL Final  . MCH 01/07/2015 25.9* 26.0 - 34.0 pg Final  . MCHC 01/07/2015 32.3  30.0 - 36.0 g/dL Final  . RDW 01/07/2015 15.8* 11.5 - 15.5 % Final  . Platelets 01/07/2015 205  150 - 400 K/uL Final  . Neutrophils Relative % 01/07/2015 89* 43 - 77 % Final  . Neutro Abs 01/07/2015 7.9* 1.7 - 7.7 K/uL Final  . Lymphocytes Relative 01/07/2015 8* 12 - 46 % Final  . Lymphs Abs 01/07/2015 0.7  0.7 - 4.0 K/uL Final  . Monocytes Relative 01/07/2015 3  3 - 12 % Final  . Monocytes Absolute 01/07/2015 0.2  0.1 - 1.0 K/uL Final  . Eosinophils Relative 01/07/2015 0  0 - 5 % Final  . Eosinophils Absolute 01/07/2015 0.0  0.0 - 0.7 K/uL  Final  . Basophils Relative 01/07/2015 0  0 - 1 % Final  . Basophils Absolute 01/07/2015 0.0  0.0 - 0.1 K/uL Final  . Sodium 01/07/2015 139  135 - 145 mmol/L Final  . Potassium 01/07/2015 4.0  3.5 - 5.1 mmol/L Final  . Chloride 01/07/2015 103  96 - 112 mmol/L Final  . CO2 01/07/2015 27  19 - 32 mmol/L Final  . Glucose, Bld 01/07/2015 143* 70 - 99 mg/dL Final  . BUN 01/07/2015 15  6 - 23 mg/dL Final  . Creatinine, Ser 01/07/2015 1.37* 0.50 - 1.10 mg/dL Final  . Calcium 01/07/2015 9.1  8.4 - 10.5 mg/dL Final  .  Total Protein 01/07/2015 7.4  6.0 - 8.3 g/dL Final  . Albumin 01/07/2015 3.4* 3.5 - 5.2 g/dL Final  . AST 01/07/2015 19  0 - 37 U/L Final  . ALT 01/07/2015 18  0 - 35 U/L Final  . Alkaline Phosphatase 01/07/2015 65  39 - 117 U/L Final  . Total Bilirubin 01/07/2015 0.5  0.3 - 1.2 mg/dL Final  . GFR calc non Af Amer 01/07/2015 39* >90 mL/min Final  . GFR calc Af Amer 01/07/2015 45* >90 mL/min Final   Comment: (NOTE) The eGFR has been calculated using the CKD EPI equation. This calculation has not been validated in all clinical situations. eGFR's persistently <90 mL/min signify possible Chronic Kidney Disease.   . Anion gap 01/07/2015 9  5 - 15 Final  . Lactic Acid, Venous 01/07/2015 2.26* 0.5 - 2.0 mmol/L Final  . Comment 01/07/2015 NOTIFIED PHYSICIAN   Final  . Color, Urine 01/07/2015 YELLOW  YELLOW Final  . APPearance 01/07/2015 HAZY* CLEAR Final  . Specific Gravity, Urine 01/07/2015 1.012  1.005 - 1.030 Final  . pH 01/07/2015 7.0  5.0 - 8.0 Final  . Glucose, UA 01/07/2015 NEGATIVE  NEGATIVE mg/dL Final  . Hgb urine dipstick 01/07/2015 NEGATIVE  NEGATIVE Final  . Bilirubin Urine 01/07/2015 NEGATIVE  NEGATIVE Final  . Ketones, ur 01/07/2015 NEGATIVE  NEGATIVE mg/dL Final  . Protein, ur 01/07/2015 100* NEGATIVE mg/dL Final  . Urobilinogen, UA 01/07/2015 0.2  0.0 - 1.0 mg/dL Final  . Nitrite 01/07/2015 NEGATIVE  NEGATIVE Final  . Leukocytes, UA 01/07/2015 TRACE* NEGATIVE Final  . Troponin i, poc 01/07/2015 0.03  0.00 - 0.08 ng/mL Final  . Comment 3 01/07/2015          Final   Comment: Due to the release kinetics of cTnI, a negative result within the first hours of the onset of symptoms does not rule out myocardial infarction with certainty. If myocardial infarction is still suspected, repeat the test at appropriate intervals.   . Lactic Acid, Venous 01/07/2015 1.44  0.5 - 2.0 mmol/L Final  . Squamous Epithelial / LPF 01/07/2015 FEW* RARE Final  . WBC, UA 01/07/2015 3-6  <3  WBC/hpf Final  . RBC / HPF 01/07/2015 0-2  <3 RBC/hpf Final  . Bacteria, UA 01/07/2015 MANY* RARE Final    No results found.   Assessment/Plan   ICD-9-CM ICD-10-CM   1. Insomnia 780.52 G47.00   2. Vascular dementia with depressed mood 290.43 F01.51     F32.9   3. Chronic diastolic CHF (congestive heart failure) (HCC) 428.32 I50.32    428.0    4. DM (diabetes mellitus) type II controlled peripheral vascular disorder (HCC) 250.70 E11.51    443.81    5. Other emphysema (HCC) 492.8 J43.8   6. Essential hypertension 401.9 I10   7. Colostomy in  place [facility]) V44.3 Z93.3   8. Gastroesophageal reflux disease, esophagitis presence not specified 530.81 K21.9    Increase trazodone 45m qhs  Psych services to follow  Cont other meds as ordered  F/u with specialists as indicated  PT/OT/ST as indicated  Will follow  Elita Dame S. CPerlie Gold PCrisp Regional Hospitaland Adult Medicine [address] [phone] (Monday-Friday 8 AM - 5 PM) ([phone] 5 PM and follow prompts

## 2015-10-16 DIAGNOSIS — F0280 Dementia in other diseases classified elsewhere without behavioral disturbance: Secondary | ICD-10-CM | POA: Diagnosis not present

## 2015-10-16 DIAGNOSIS — N183 Chronic kidney disease, stage 3 (moderate): Secondary | ICD-10-CM | POA: Diagnosis not present

## 2015-10-16 DIAGNOSIS — I10 Essential (primary) hypertension: Secondary | ICD-10-CM | POA: Diagnosis not present

## 2015-10-16 DIAGNOSIS — I5031 Acute diastolic (congestive) heart failure: Secondary | ICD-10-CM | POA: Diagnosis not present

## 2015-10-16 DIAGNOSIS — F329 Major depressive disorder, single episode, unspecified: Secondary | ICD-10-CM | POA: Diagnosis not present

## 2015-10-17 DIAGNOSIS — F329 Major depressive disorder, single episode, unspecified: Secondary | ICD-10-CM | POA: Diagnosis not present

## 2015-10-17 DIAGNOSIS — I5031 Acute diastolic (congestive) heart failure: Secondary | ICD-10-CM | POA: Diagnosis not present

## 2015-10-17 DIAGNOSIS — F0280 Dementia in other diseases classified elsewhere without behavioral disturbance: Secondary | ICD-10-CM | POA: Diagnosis not present

## 2015-10-17 DIAGNOSIS — N183 Chronic kidney disease, stage 3 (moderate): Secondary | ICD-10-CM | POA: Diagnosis not present

## 2015-10-17 DIAGNOSIS — G4700 Insomnia, unspecified: Secondary | ICD-10-CM | POA: Diagnosis not present

## 2015-10-17 DIAGNOSIS — I10 Essential (primary) hypertension: Secondary | ICD-10-CM | POA: Diagnosis not present

## 2015-10-21 DIAGNOSIS — I10 Essential (primary) hypertension: Secondary | ICD-10-CM | POA: Diagnosis not present

## 2015-10-21 DIAGNOSIS — N183 Chronic kidney disease, stage 3 (moderate): Secondary | ICD-10-CM | POA: Diagnosis not present

## 2015-10-21 DIAGNOSIS — F329 Major depressive disorder, single episode, unspecified: Secondary | ICD-10-CM | POA: Diagnosis not present

## 2015-10-21 DIAGNOSIS — F0280 Dementia in other diseases classified elsewhere without behavioral disturbance: Secondary | ICD-10-CM | POA: Diagnosis not present

## 2015-10-21 DIAGNOSIS — I5031 Acute diastolic (congestive) heart failure: Secondary | ICD-10-CM | POA: Diagnosis not present

## 2015-10-22 ENCOUNTER — Encounter: Payer: Self-pay | Admitting: Internal Medicine

## 2015-10-22 ENCOUNTER — Non-Acute Institutional Stay (SKILLED_NURSING_FACILITY): Payer: Medicare Other | Admitting: Internal Medicine

## 2015-10-22 DIAGNOSIS — I10 Essential (primary) hypertension: Secondary | ICD-10-CM | POA: Diagnosis not present

## 2015-10-22 DIAGNOSIS — M25551 Pain in right hip: Secondary | ICD-10-CM

## 2015-10-22 DIAGNOSIS — F0151 Vascular dementia with behavioral disturbance: Secondary | ICD-10-CM | POA: Diagnosis not present

## 2015-10-22 DIAGNOSIS — F0153 Vascular dementia, unspecified severity, with mood disturbance: Secondary | ICD-10-CM

## 2015-10-22 DIAGNOSIS — F0280 Dementia in other diseases classified elsewhere without behavioral disturbance: Secondary | ICD-10-CM | POA: Diagnosis not present

## 2015-10-22 DIAGNOSIS — F329 Major depressive disorder, single episode, unspecified: Secondary | ICD-10-CM

## 2015-10-22 DIAGNOSIS — Z9181 History of falling: Secondary | ICD-10-CM | POA: Diagnosis not present

## 2015-10-22 DIAGNOSIS — W06XXXA Fall from bed, initial encounter: Secondary | ICD-10-CM

## 2015-10-22 DIAGNOSIS — N183 Chronic kidney disease, stage 3 (moderate): Secondary | ICD-10-CM | POA: Diagnosis not present

## 2015-10-22 DIAGNOSIS — I5031 Acute diastolic (congestive) heart failure: Secondary | ICD-10-CM | POA: Diagnosis not present

## 2015-10-22 NOTE — Progress Notes (Signed)
Patient ID: Sheila Flynn, female   DOB: 1946-08-08, 69 y.o.   MRN: [id number]    DATE: 10/22/15  Location:  [place] of Service: SNF [phone])   Extended Emergency Contact Information Primary Emergency Contact: Lindsay,Teresa Address: [address]          [place] Phone: [phone] Relation: Daughter Secondary Emergency Contact: Carlyle Basques Address: [address] [place], [place] [address] Johnnette Litter of [employer] Phone: [phone] Relation: Grandaughter  Advanced Directive information  FULL CODE  Chief Complaint  Patient presents with  . Acute Visit    HPI:  69 yo long term resident seen today for right hip pain. She fell OOB onto right hip 2 days ago when trying to transfer into her w/c. She reports severe pain. No numbness. She is unable to sleep on right side. Pain interrupts sleep.  She has not taken any tramadol. She also injured right knee 2 weeks ago when she fell. She is receiving PT for that.  HTN - BP elevated due to uncontrolled pain  She is a poor historian due to dementia. Hx obtained from nursing  Past Medical History  Diagnosis Date  . 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 Chronic diastolic CHF (congestive heart failure) ([place])   . CAD (coronary artery disease)     Dr. Matthew Saras  . Angina at rest [facility])   . Hyperlipidemia LDL goal < 100   . Aspiration pneumonia ([place])   . COPD (chronic obstructive pulmonary disease) ([place])   . Acute respiratory failure ([place]) 12/21/10  . Pulmonary hypertension ([place])   . Chronic constipation   . Rectal cancer ([place])     s/p colostomy  . Thyroid goiter   . 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 Vascular dementia with depressed mood   . Pneumonia 12/21/10  . Delirium, induced by drug ([place])     polypharmacy  . Sacral fracture, closed ([employer])    s/p kyphoplasty  . Fall     history of falls  . Dementia   . GERD (gastroesophageal reflux disease)   . Major depressive disorder ([place])   . CKD (chronic kidney disease)   . Myocardial infarction ([place])     "I've had about 3" (11/06/2014)  . DM (diabetes mellitus) type II controlled peripheral vascular disorder ([place])   . On home oxygen therapy     "prn at the nursing home" (11/06/2014)  . Migraine     "monthly" (11/06/2014)  . Stroke [facility]) 2011    postop  . Stroke [facility]) ?2014    "left side sometimes weaker since" (11/06/2014)    Past Surgical History  Procedure Laterality Date  . Kyphoplasty      sacrum  . Colostomy      rectal ca  . Appendectomy    . Tonsillectomy    . Abdominal hysterectomy    . Tubal ligation    . Colon surgery      Patient Care Team: Gildardo Cranker, DO as PCP - General (Internal Medicine) Gerlene Fee, NP as Nurse Practitioner (Nurse Practitioner)  Social History   Social History  . Marital Status: Widowed    Spouse Name: N/A  . Number of Children: N/A  . Years of Education: N/A   Occupational History  .      custodial  Social History Main Topics  . Smoking status: Former Smoker -- 0.50 packs/day for 2 years    Types: Cigarettes  . Smokeless tobacco: Never Used     Comment: "quit smoking cigarettes in 1969"  . Alcohol Use: No  . Drug Use: Yes     Comment: previously smoked crack, has had positive UDS for amphetamines and narcotics  . Sexual Activity: No   Other Topics Concern  . Not on file   Social History Narrative   Born in [place], has some high school education, retired from custodian job, divorced, has children     reports that she has quit smoking. Her smoking use included Cigarettes. She has a 1 pack-year smoking history. She has never used smokeless tobacco. She reports that she uses illicit drugs. She reports that she does not drink alcohol.  Immunization History  Administered Date(s) Administered  .  Influenza-Unspecified 08/09/2015    Allergies  Allergen Reactions  . Ace Inhibitors Swelling    Medications: Patient's Medications  New Prescriptions   No medications on file  Previous Medications   ACETAMINOPHEN (TYLENOL) 325 MG TABLET    Take 650 mg by mouth every 6 (six) hours as needed for mild pain.   AMLODIPINE (NORVASC) 10 MG TABLET    Take 10 mg by mouth daily.   ASPIRIN (ASPIRIN EC) 81 MG EC TABLET    Take 81 mg by mouth daily. Swallow whole.   ATORVASTATIN (LIPITOR) 80 MG TABLET    Take 1 tablet (80 mg total) by mouth daily.   CARVEDILOL (COREG) 3.125 MG TABLET    Take 3.125 mg by mouth 2 (two) times daily with a meal.   DOCUSATE SODIUM (COLACE) 100 MG CAPSULE    Take 100 mg by mouth 3 (three) times daily.   GLIPIZIDE (GLUCOTROL) 5 MG TABLET    Take 0.5 tablets (2.5 mg total) by mouth daily before breakfast.   HYDRALAZINE (APRESOLINE) 25 MG TABLET    Take 25 mg by mouth 2 (two) times daily.    IPRATROPIUM-ALBUTEROL (DUONEB) 0.5-2.5 (3) MG/3ML SOLN    Take 3 mLs by nebulization every 6 (six) hours as needed (shortness of breathe/wheezing).   ISOSORBIDE MONONITRATE (ISMO,MONOKET) 20 MG TABLET    Take 60 mg by mouth daily.    LOSARTAN (COZAAR) 25 MG TABLET    Take 2 tablets (50 mg total) by mouth daily.   NITROGLYCERIN (NITROSTAT) 0.4 MG SL TABLET    Place 0.4 mg under the tongue every 5 (five) minutes as needed for chest pain.   OMEPRAZOLE (PRILOSEC) 20 MG CAPSULE    Take 20 mg by mouth daily.    PAROXETINE (PAXIL) 20 MG TABLET    Take 30 mg by mouth daily.    PREDNISONE (DELTASONE) 20 MG TABLET    Take 1 tablet (20 mg total) by mouth daily with breakfast.   SENNOSIDES-DOCUSATE SODIUM (SENOKOT-S) 8.6-50 MG TABLET    Take 2 tablets by mouth every evening.    TRAMADOL (ULTRAM) 50 MG TABLET    Take one tablet by mouth every 8 hours as needed for pain   VITAMIN D, ERGOCALCIFEROL, (DRISDOL) 50000 UNITS CAPS CAPSULE    Take 50,000 Units by mouth every 30 (thirty) days.  Modified  Medications   No medications on file  Discontinued Medications   No medications on file    Review of Systems  Unable to perform ROS: Dementia    Filed Vitals:   10/22/15 1059  BP: 175/87  Pulse: 87  Temp: 97.3 F (36.3 C)  Weight: 182 lb (82.555 kg)  SpO2: 98%   Body mass index is 28.5 kg/(m^2).  Physical Exam  Constitutional: She appears well-developed and well-nourished.  Looks uncomfortable in NAD, lying in bed onto left side  Musculoskeletal: She exhibits edema and tenderness.  R>L knee medial swelling with crepitus on ROM; right hip reduced internal rotation with pain on movement; reduced right hip flexion; right greater trochanteric TTP with iliotibial band hypertrophy  Neurological: She is alert.  Skin: Skin is warm and dry. No rash noted.  Psychiatric: She has a normal mood and affect. Her behavior is normal.     Labs reviewed: No visits with results within 3 Month(s) from this visit. Latest known visit with results is:  Admission on 01/07/2015, Discharged on 01/07/2015  Component Date Value Ref Range Status  . WBC 01/07/2015 8.9  4.0 - 10.5 K/uL Final  . RBC 01/07/2015 5.05  3.87 - 5.11 MIL/uL Final  . Hemoglobin 01/07/2015 13.1  12.0 - 15.0 g/dL Final  . HCT 01/07/2015 40.5  36.0 - 46.0 % Final  . MCV 01/07/2015 80.2  78.0 - 100.0 fL Final  . MCH 01/07/2015 25.9* 26.0 - 34.0 pg Final  . MCHC 01/07/2015 32.3  30.0 - 36.0 g/dL Final  . RDW 01/07/2015 15.8* 11.5 - 15.5 % Final  . Platelets 01/07/2015 205  150 - 400 K/uL Final  . Neutrophils Relative % 01/07/2015 89* 43 - 77 % Final  . Neutro Abs 01/07/2015 7.9* 1.7 - 7.7 K/uL Final  . Lymphocytes Relative 01/07/2015 8* 12 - 46 % Final  . Lymphs Abs 01/07/2015 0.7  0.7 - 4.0 K/uL Final  . Monocytes Relative 01/07/2015 3  3 - 12 % Final  . Monocytes Absolute 01/07/2015 0.2  0.1 - 1.0 K/uL Final  . Eosinophils Relative 01/07/2015 0  0 - 5 % Final  . Eosinophils Absolute 01/07/2015 0.0  0.0 - 0.7 K/uL Final    . Basophils Relative 01/07/2015 0  0 - 1 % Final  . Basophils Absolute 01/07/2015 0.0  0.0 - 0.1 K/uL Final  . Sodium 01/07/2015 139  135 - 145 mmol/L Final  . Potassium 01/07/2015 4.0  3.5 - 5.1 mmol/L Final  . Chloride 01/07/2015 103  96 - 112 mmol/L Final  . CO2 01/07/2015 27  19 - 32 mmol/L Final  . Glucose, Bld 01/07/2015 143* 70 - 99 mg/dL Final  . BUN 01/07/2015 15  6 - 23 mg/dL Final  . Creatinine, Ser 01/07/2015 1.37* 0.50 - 1.10 mg/dL Final  . Calcium 01/07/2015 9.1  8.4 - 10.5 mg/dL Final  . Total Protein 01/07/2015 7.4  6.0 - 8.3 g/dL Final  . Albumin 01/07/2015 3.4* 3.5 - 5.2 g/dL Final  . AST 01/07/2015 19  0 - 37 U/L Final  . ALT 01/07/2015 18  0 - 35 U/L Final  . Alkaline Phosphatase 01/07/2015 65  39 - 117 U/L Final  . Total Bilirubin 01/07/2015 0.5  0.3 - 1.2 mg/dL Final  . GFR calc non Af Amer 01/07/2015 39* >90 mL/min Final  . GFR calc Af Amer 01/07/2015 45* >90 mL/min Final   Comment: (NOTE) The eGFR has been calculated using the CKD EPI equation. This calculation has not been validated in all clinical situations. eGFR's persistently <90 mL/min signify possible Chronic Kidney Disease.   . Anion gap 01/07/2015 9  5 - 15 Final  . Lactic Acid, Venous 01/07/2015 2.26* 0.5 - 2.0 mmol/L Final  . Comment  01/07/2015 NOTIFIED PHYSICIAN   Final  . Color, Urine 01/07/2015 YELLOW  YELLOW Final  . APPearance 01/07/2015 HAZY* CLEAR Final  . Specific Gravity, Urine 01/07/2015 1.012  1.005 - 1.030 Final  . pH 01/07/2015 7.0  5.0 - 8.0 Final  . Glucose, UA 01/07/2015 NEGATIVE  NEGATIVE mg/dL Final  . Hgb urine dipstick 01/07/2015 NEGATIVE  NEGATIVE Final  . Bilirubin Urine 01/07/2015 NEGATIVE  NEGATIVE Final  . Ketones, ur 01/07/2015 NEGATIVE  NEGATIVE mg/dL Final  . Protein, ur 01/07/2015 100* NEGATIVE mg/dL Final  . Urobilinogen, UA 01/07/2015 0.2  0.0 - 1.0 mg/dL Final  . Nitrite 01/07/2015 NEGATIVE  NEGATIVE Final  . Leukocytes, UA 01/07/2015 TRACE* NEGATIVE Final  .  Troponin i, poc 01/07/2015 0.03  0.00 - 0.08 ng/mL Final  . Comment 3 01/07/2015          Final   Comment: Due to the release kinetics of cTnI, a negative result within the first hours of the onset of symptoms does not rule out myocardial infarction with certainty. If myocardial infarction is still suspected, repeat the test at appropriate intervals.   . Lactic Acid, Venous 01/07/2015 1.44  0.5 - 2.0 mmol/L Final  . Squamous Epithelial / LPF 01/07/2015 FEW* RARE Final  . WBC, UA 01/07/2015 3-6  <3 WBC/hpf Final  . RBC / HPF 01/07/2015 0-2  <3 RBC/hpf Final  . Bacteria, UA 01/07/2015 MANY* RARE Final    No results found.   Assessment/Plan   ICD-9-CM ICD-10-CM   1. Acute right hip pain (DOI 10/20/15) 719.45 M25.551   2. Fall from bed, initial encounter [phone] W06.XXXA   3. Vascular dementia with depressed mood - stable 290.43 F01.51     F32.9   4. Essential hypertension - elevated due to #1 401.9 I10     Stat right hip xray to r/o fx  Cont tramadol prn pain  Cont other meds as ordered  Fall precautions  Will follow  Gershon Shorten S. Perlie Gold  [facility] and Adult Medicine [address] [place], [place] [address] [phone] Cell (Monday-Friday 8 AM - 5 PM) [phone] After 5 PM and follow prompts

## 2015-10-23 DIAGNOSIS — F329 Major depressive disorder, single episode, unspecified: Secondary | ICD-10-CM | POA: Diagnosis not present

## 2015-10-23 DIAGNOSIS — I10 Essential (primary) hypertension: Secondary | ICD-10-CM | POA: Diagnosis not present

## 2015-10-23 DIAGNOSIS — F0280 Dementia in other diseases classified elsewhere without behavioral disturbance: Secondary | ICD-10-CM | POA: Diagnosis not present

## 2015-10-23 DIAGNOSIS — I5031 Acute diastolic (congestive) heart failure: Secondary | ICD-10-CM | POA: Diagnosis not present

## 2015-10-23 DIAGNOSIS — N183 Chronic kidney disease, stage 3 (moderate): Secondary | ICD-10-CM | POA: Diagnosis not present

## 2015-10-24 DIAGNOSIS — F329 Major depressive disorder, single episode, unspecified: Secondary | ICD-10-CM | POA: Diagnosis not present

## 2015-10-24 DIAGNOSIS — I10 Essential (primary) hypertension: Secondary | ICD-10-CM | POA: Diagnosis not present

## 2015-10-24 DIAGNOSIS — F0280 Dementia in other diseases classified elsewhere without behavioral disturbance: Secondary | ICD-10-CM | POA: Diagnosis not present

## 2015-10-24 DIAGNOSIS — N183 Chronic kidney disease, stage 3 (moderate): Secondary | ICD-10-CM | POA: Diagnosis not present

## 2015-10-24 DIAGNOSIS — I5031 Acute diastolic (congestive) heart failure: Secondary | ICD-10-CM | POA: Diagnosis not present

## 2015-10-25 DIAGNOSIS — F0280 Dementia in other diseases classified elsewhere without behavioral disturbance: Secondary | ICD-10-CM | POA: Diagnosis not present

## 2015-10-25 DIAGNOSIS — I10 Essential (primary) hypertension: Secondary | ICD-10-CM | POA: Diagnosis not present

## 2015-10-25 DIAGNOSIS — N183 Chronic kidney disease, stage 3 (moderate): Secondary | ICD-10-CM | POA: Diagnosis not present

## 2015-10-25 DIAGNOSIS — F329 Major depressive disorder, single episode, unspecified: Secondary | ICD-10-CM | POA: Diagnosis not present

## 2015-10-25 DIAGNOSIS — I5031 Acute diastolic (congestive) heart failure: Secondary | ICD-10-CM | POA: Diagnosis not present

## 2015-10-28 DIAGNOSIS — N183 Chronic kidney disease, stage 3 (moderate): Secondary | ICD-10-CM | POA: Diagnosis not present

## 2015-10-28 DIAGNOSIS — H2513 Age-related nuclear cataract, bilateral: Secondary | ICD-10-CM | POA: Diagnosis not present

## 2015-10-28 DIAGNOSIS — H02839 Dermatochalasis of unspecified eye, unspecified eyelid: Secondary | ICD-10-CM | POA: Diagnosis not present

## 2015-10-28 DIAGNOSIS — I10 Essential (primary) hypertension: Secondary | ICD-10-CM | POA: Diagnosis not present

## 2015-10-28 DIAGNOSIS — H11151 Pinguecula, right eye: Secondary | ICD-10-CM | POA: Diagnosis not present

## 2015-10-28 DIAGNOSIS — I5031 Acute diastolic (congestive) heart failure: Secondary | ICD-10-CM | POA: Diagnosis not present

## 2015-10-28 DIAGNOSIS — F0280 Dementia in other diseases classified elsewhere without behavioral disturbance: Secondary | ICD-10-CM | POA: Diagnosis not present

## 2015-10-28 DIAGNOSIS — F329 Major depressive disorder, single episode, unspecified: Secondary | ICD-10-CM | POA: Diagnosis not present

## 2015-10-28 DIAGNOSIS — H0289 Other specified disorders of eyelid: Secondary | ICD-10-CM | POA: Diagnosis not present

## 2015-10-28 DIAGNOSIS — H25013 Cortical age-related cataract, bilateral: Secondary | ICD-10-CM | POA: Diagnosis not present

## 2015-10-28 DIAGNOSIS — H18413 Arcus senilis, bilateral: Secondary | ICD-10-CM | POA: Diagnosis not present

## 2015-10-29 DIAGNOSIS — F0280 Dementia in other diseases classified elsewhere without behavioral disturbance: Secondary | ICD-10-CM | POA: Diagnosis not present

## 2015-10-29 DIAGNOSIS — I10 Essential (primary) hypertension: Secondary | ICD-10-CM | POA: Diagnosis not present

## 2015-10-29 DIAGNOSIS — N183 Chronic kidney disease, stage 3 (moderate): Secondary | ICD-10-CM | POA: Diagnosis not present

## 2015-10-29 DIAGNOSIS — I5031 Acute diastolic (congestive) heart failure: Secondary | ICD-10-CM | POA: Diagnosis not present

## 2015-10-29 DIAGNOSIS — F329 Major depressive disorder, single episode, unspecified: Secondary | ICD-10-CM | POA: Diagnosis not present

## 2015-11-04 ENCOUNTER — Encounter: Payer: Self-pay | Admitting: Adult Health

## 2015-11-04 DIAGNOSIS — F329 Major depressive disorder, single episode, unspecified: Secondary | ICD-10-CM | POA: Diagnosis not present

## 2015-11-04 DIAGNOSIS — I5031 Acute diastolic (congestive) heart failure: Secondary | ICD-10-CM | POA: Diagnosis not present

## 2015-11-04 DIAGNOSIS — F0280 Dementia in other diseases classified elsewhere without behavioral disturbance: Secondary | ICD-10-CM | POA: Diagnosis not present

## 2015-11-04 DIAGNOSIS — E785 Hyperlipidemia, unspecified: Secondary | ICD-10-CM

## 2015-11-04 DIAGNOSIS — I10 Essential (primary) hypertension: Secondary | ICD-10-CM | POA: Diagnosis not present

## 2015-11-04 DIAGNOSIS — N183 Chronic kidney disease, stage 3 (moderate): Secondary | ICD-10-CM | POA: Diagnosis not present

## 2015-11-04 DIAGNOSIS — E1169 Type 2 diabetes mellitus with other specified complication: Secondary | ICD-10-CM | POA: Insufficient documentation

## 2015-11-04 NOTE — Progress Notes (Signed)
Patient ID: Sheila Flynn, female   DOB: 1946/06/11, 69 y.o.   MRN: [id number]    Facility: [facility]      Allergies  Allergen Reactions  . Ace Inhibitors Swelling    Chief Complaint  Patient presents with  . Medical Management of Chronic Issues    HPI:  She is a long term resident of this facility being seen for the management of her chronic illnesses. Overall there is little change in her status. Her blood pressure has been elevated; and her cbg's are elevated as well. She is not voicing any concerns or complaints at this time.     Past Medical History  Diagnosis Date  . 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 Chronic diastolic CHF (congestive heart failure) ([place])   . CAD (coronary artery disease)     Dr. Matthew Saras  . Angina at rest [facility])   . Hyperlipidemia LDL goal < 100   . Aspiration pneumonia ([place])   . COPD (chronic obstructive pulmonary disease) ([place])   . Acute respiratory failure ([place]) 12/21/10  . Pulmonary hypertension ([place])   . Chronic constipation   . Rectal cancer ([place])     s/p colostomy  . Thyroid goiter   . 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 Vascular dementia with depressed mood   . Pneumonia 12/21/10  . Delirium, induced by drug ([place])     polypharmacy  . Sacral fracture, closed ([place])     s/p kyphoplasty  . Fall     history of falls  . Dementia   . GERD (gastroesophageal reflux disease)   . Major depressive disorder ([place])   . CKD (chronic kidney disease)   . Myocardial infarction ([place])     "I've had about 3" (11/06/2014)  . DM (diabetes mellitus) type II controlled peripheral vascular disorder ([place])   . On home oxygen therapy     "prn at the nursing home" (11/06/2014)  . Migraine     "monthly" (11/06/2014)  . Stroke [facility]) 2011    postop  . Stroke [facility]) ?2014    "left side sometimes weaker since" (11/06/2014)    Past Surgical History  Procedure  Laterality Date  . Kyphoplasty      sacrum  . Colostomy      rectal ca  . Appendectomy    . Tonsillectomy    . Abdominal hysterectomy    . Tubal ligation    . Colon surgery      VITAL SIGNS BP 156/72 mmHg  Pulse 100  Ht 5\' 7"  (1.702 m)  Wt 182 lb (82.555 kg)  BMI 28.50 kg/m2  Patient's Medications  New Prescriptions   No medications on file  Previous Medications   ACETAMINOPHEN (TYLENOL) 325 MG TABLET    Take 650 mg by mouth every 6 (six) hours as needed for mild pain.   AMLODIPINE (NORVASC) 10 MG TABLET    Take 10 mg by mouth daily.   ASPIRIN (ASPIRIN EC) 81 MG EC TABLET    Take 81 mg by mouth daily. Swallow whole.   ATORVASTATIN (LIPITOR) 80 MG TABLET    Take 1 tablet (80 mg total) by mouth daily.   CALCITRIOL (ROCALTROL) 0.25 MCG CAPSULE    Take 0.25 mcg by mouth daily.   CARVEDILOL (COREG) 3.125 MG TABLET    Take 3.125 mg by mouth 2 (two) times daily with a meal.   DOCUSATE SODIUM (COLACE) 100 MG CAPSULE  Take 100 mg by mouth 3 (three) times daily.   GLIPIZIDE (GLUCOTROL) 5 MG TABLET    Take 0.5 tablets (2.5 mg total) by mouth daily before breakfast.   HYDRALAZINE (APRESOLINE) 25 MG TABLET    Take 25 mg by mouth 2 (two) times daily.    IPRATROPIUM-ALBUTEROL (DUONEB) 0.5-2.5 (3) MG/3ML SOLN    Take 3 mLs by nebulization every 6 (six) hours as needed (shortness of breathe/wheezing).   ISOSORBIDE MONONITRATE (ISMO,MONOKET) 20 MG TABLET    Take 60 mg by mouth daily.    LOSARTAN (COZAAR) 25 MG TABLET    Take 2 tablets (50 mg total) by mouth daily.   NITROGLYCERIN (NITROSTAT) 0.4 MG SL TABLET    Place 0.4 mg under the tongue every 5 (five) minutes as needed for chest pain.   OMEPRAZOLE (PRILOSEC) 20 MG CAPSULE    Take 20 mg by mouth daily.    PAROXETINE (PAXIL) 20 MG TABLET    Take 30 mg by mouth daily.    PREDNISONE (DELTASONE) 20 MG TABLET    Take 1 tablet (20 mg total) by mouth daily with breakfast.   SENNOSIDES-DOCUSATE SODIUM (SENOKOT-S) 8.6-50 MG TABLET    Take 2 tablets  by mouth every evening.    TRAMADOL (ULTRAM) 50 MG TABLET    Take one tablet by mouth every 8 hours as needed for pain  Modified Medications   No medications on file  Discontinued Medications     SIGNIFICANT DIAGNOSTIC EXAMS   11-06-14: chest x-ray: Probable left lower lobe retrocardiac opacity, atelectasis versuspneumonia. Grossly stable cardiomegaly allowing for differences in technique.   LABS REVIEWED:   10-31-14: glucose 100 bun 14; creat 1.38; k+3.7; na++140; vit d 31  11-06-14: wbc 7.3; hgb 12.1; hct 38.5; mcv 81.9; plt 227; glucose 108; bun 15; creat 1.42; k+3.3; na++142 11-12-14: wbc 5.5; hgb 11.5; hct 36.4; mcv 81.4; plt 237; glucose 50; bun 21; creat 1.4; k+4.2; na++140; liver normal albumin 3.5; tsh 0.487; hgb a1c 6.1  03-11-15: wbc 5.2; hgb 11.9; hct 37.2; mcv 81; plt 287; chol 198; ldl 127; trig 122; hdl 42; hgb a1c 6.7;  vit d 24 04-22-15: glucose 62; bun 18; creat 1.40; k+ 3.5 ;na++ 141 07-22-15: chol 179; ldl 104; trig 112; hdl 53; liver normal albumin 4.0        Review of Systems Constitutional: Negative for appetite change and fatigue.  HENT: Negative for congestion.   Respiratory: Negative for cough, chest tightness and shortness of breath.   Cardiovascular: Negative for chest pain, palpitations and leg swelling.  Gastrointestinal: Negative for nausea, abdominal pain, diarrhea and constipation.  Musculoskeletal: Negative for myalgias and arthralgias.  Skin: Negative for pallor.  Neurological: Negative for dizziness.  Psychiatric/Behavioral: The patient is not nervous/anxious.       Physical Exam Constitutional: No distress.  Eyes: Conjunctivae are normal.  Neck: Neck supple. No JVD present. No thyromegaly present.  Cardiovascular: Normal rate, regular rhythm and intact distal pulses.   Respiratory: Effort normal and breath sounds normal. No respiratory distress. She has no wheezes.  GI: Soft. Bowel sounds are normal. She exhibits no distension. There is no  tenderness.  Has colostomy   Musculoskeletal: She exhibits no edema.  Able to move all extremities   Lymphadenopathy:    She has no cervical adenopathy.  Neurological: She is alert.  Skin: Skin is warm and dry. She is not diaphoretic.  Psychiatric: She has a normal mood and affect.      ASSESSMENT/ PLAN:  1.  Hypertension: will continue norvasc 10 mg daily;  hydralazine 25 mg twice daily cozaar  50 mg daily  will increase her coreg to 6.25 mg twice daily   2. Dyslipidemia: will continue lipitor  80 mg daily  ldl is 104  3. Diabetes: will increase her  glipizide to 2.5 mg twice daily hgb a1c is 6.7  4. COPD: is stable will continue duoneb every 6 hours as needed; will continue prednisone 5 mg daily and will monitor his status.   5. CAD: no complaints of chest pain present will continue imdur 60 mg daily; asa 81 mg daily and prn ntg; will monitor  6. Jerrye Bushy: will continue  prilosec 20 mg daily    7. Constipation: will continue colace three times daily; and senna s 2 tabs daily   8. Depression: will continue paxil 30 mg daily   9.  CVA: is neurologically stable; will continue asa 81 mg daily   10. Vascular dementia: on change in her status; is presently not on medications; will not make changes will monitor her status.   11. Diastolic heart failure: will continue  hydralazine 25 mg twice daily imdur 60 mg daily  Will increase her coreg to 6.25 mg twice daily and will monitor her status.    Will check hgb a1c and urine micro-albumin       Ok Edwards NP [facility]  Contact [phone] Monday through Friday 8am- 5pm  After hours call [phone]

## 2015-11-05 DIAGNOSIS — I5031 Acute diastolic (congestive) heart failure: Secondary | ICD-10-CM | POA: Diagnosis not present

## 2015-11-05 DIAGNOSIS — G4700 Insomnia, unspecified: Secondary | ICD-10-CM | POA: Diagnosis not present

## 2015-11-05 DIAGNOSIS — N183 Chronic kidney disease, stage 3 (moderate): Secondary | ICD-10-CM | POA: Diagnosis not present

## 2015-11-05 DIAGNOSIS — F329 Major depressive disorder, single episode, unspecified: Secondary | ICD-10-CM | POA: Diagnosis not present

## 2015-11-05 DIAGNOSIS — F0280 Dementia in other diseases classified elsewhere without behavioral disturbance: Secondary | ICD-10-CM | POA: Diagnosis not present

## 2015-11-05 DIAGNOSIS — I10 Essential (primary) hypertension: Secondary | ICD-10-CM | POA: Diagnosis not present

## 2015-11-07 DIAGNOSIS — F329 Major depressive disorder, single episode, unspecified: Secondary | ICD-10-CM | POA: Diagnosis not present

## 2015-11-07 DIAGNOSIS — N183 Chronic kidney disease, stage 3 (moderate): Secondary | ICD-10-CM | POA: Diagnosis not present

## 2015-11-07 DIAGNOSIS — I10 Essential (primary) hypertension: Secondary | ICD-10-CM | POA: Diagnosis not present

## 2015-11-07 DIAGNOSIS — I5031 Acute diastolic (congestive) heart failure: Secondary | ICD-10-CM | POA: Diagnosis not present

## 2015-11-07 DIAGNOSIS — F0280 Dementia in other diseases classified elsewhere without behavioral disturbance: Secondary | ICD-10-CM | POA: Diagnosis not present

## 2015-11-08 DIAGNOSIS — I5031 Acute diastolic (congestive) heart failure: Secondary | ICD-10-CM | POA: Diagnosis not present

## 2015-11-08 DIAGNOSIS — F0280 Dementia in other diseases classified elsewhere without behavioral disturbance: Secondary | ICD-10-CM | POA: Diagnosis not present

## 2015-11-08 DIAGNOSIS — I10 Essential (primary) hypertension: Secondary | ICD-10-CM | POA: Diagnosis not present

## 2015-11-08 DIAGNOSIS — F329 Major depressive disorder, single episode, unspecified: Secondary | ICD-10-CM | POA: Diagnosis not present

## 2015-11-08 DIAGNOSIS — N183 Chronic kidney disease, stage 3 (moderate): Secondary | ICD-10-CM | POA: Diagnosis not present

## 2015-11-12 DIAGNOSIS — R262 Difficulty in walking, not elsewhere classified: Secondary | ICD-10-CM | POA: Diagnosis not present

## 2015-11-12 DIAGNOSIS — M6281 Muscle weakness (generalized): Secondary | ICD-10-CM | POA: Diagnosis not present

## 2015-11-12 DIAGNOSIS — N183 Chronic kidney disease, stage 3 (moderate): Secondary | ICD-10-CM | POA: Diagnosis not present

## 2015-11-13 DIAGNOSIS — N183 Chronic kidney disease, stage 3 (moderate): Secondary | ICD-10-CM | POA: Diagnosis not present

## 2015-11-13 DIAGNOSIS — R262 Difficulty in walking, not elsewhere classified: Secondary | ICD-10-CM | POA: Diagnosis not present

## 2015-11-13 DIAGNOSIS — M6281 Muscle weakness (generalized): Secondary | ICD-10-CM | POA: Diagnosis not present

## 2015-11-14 DIAGNOSIS — H2511 Age-related nuclear cataract, right eye: Secondary | ICD-10-CM | POA: Diagnosis not present

## 2015-11-14 DIAGNOSIS — H25811 Combined forms of age-related cataract, right eye: Secondary | ICD-10-CM | POA: Diagnosis not present

## 2015-11-15 ENCOUNTER — Non-Acute Institutional Stay (SKILLED_NURSING_FACILITY): Payer: Medicare Other | Admitting: Adult Health

## 2015-11-15 DIAGNOSIS — E1169 Type 2 diabetes mellitus with other specified complication: Secondary | ICD-10-CM

## 2015-11-15 DIAGNOSIS — K5900 Constipation, unspecified: Secondary | ICD-10-CM

## 2015-11-15 DIAGNOSIS — F0151 Vascular dementia with behavioral disturbance: Secondary | ICD-10-CM | POA: Diagnosis not present

## 2015-11-15 DIAGNOSIS — I25119 Atherosclerotic heart disease of native coronary artery with unspecified angina pectoris: Secondary | ICD-10-CM | POA: Diagnosis not present

## 2015-11-15 DIAGNOSIS — F329 Major depressive disorder, single episode, unspecified: Secondary | ICD-10-CM | POA: Diagnosis not present

## 2015-11-15 DIAGNOSIS — E785 Hyperlipidemia, unspecified: Secondary | ICD-10-CM | POA: Diagnosis not present

## 2015-11-15 DIAGNOSIS — I6350 Cerebral infarction due to unspecified occlusion or stenosis of unspecified cerebral artery: Secondary | ICD-10-CM

## 2015-11-15 DIAGNOSIS — I110 Hypertensive heart disease with heart failure: Secondary | ICD-10-CM

## 2015-11-15 DIAGNOSIS — K219 Gastro-esophageal reflux disease without esophagitis: Secondary | ICD-10-CM | POA: Diagnosis not present

## 2015-11-15 DIAGNOSIS — K5909 Other constipation: Secondary | ICD-10-CM

## 2015-11-15 DIAGNOSIS — I5032 Chronic diastolic (congestive) heart failure: Secondary | ICD-10-CM | POA: Diagnosis not present

## 2015-11-15 DIAGNOSIS — F0153 Vascular dementia, unspecified severity, with mood disturbance: Secondary | ICD-10-CM

## 2015-11-15 DIAGNOSIS — Z933 Colostomy status: Secondary | ICD-10-CM | POA: Diagnosis not present

## 2015-11-15 DIAGNOSIS — M6281 Muscle weakness (generalized): Secondary | ICD-10-CM | POA: Diagnosis not present

## 2015-11-15 DIAGNOSIS — R262 Difficulty in walking, not elsewhere classified: Secondary | ICD-10-CM | POA: Diagnosis not present

## 2015-11-15 DIAGNOSIS — N183 Chronic kidney disease, stage 3 (moderate): Secondary | ICD-10-CM | POA: Diagnosis not present

## 2015-11-15 DIAGNOSIS — H2512 Age-related nuclear cataract, left eye: Secondary | ICD-10-CM | POA: Diagnosis not present

## 2015-11-18 DIAGNOSIS — M6281 Muscle weakness (generalized): Secondary | ICD-10-CM | POA: Diagnosis not present

## 2015-11-18 DIAGNOSIS — N183 Chronic kidney disease, stage 3 (moderate): Secondary | ICD-10-CM | POA: Diagnosis not present

## 2015-11-18 DIAGNOSIS — R262 Difficulty in walking, not elsewhere classified: Secondary | ICD-10-CM | POA: Diagnosis not present

## 2015-11-19 DIAGNOSIS — M6281 Muscle weakness (generalized): Secondary | ICD-10-CM | POA: Diagnosis not present

## 2015-11-19 DIAGNOSIS — N183 Chronic kidney disease, stage 3 (moderate): Secondary | ICD-10-CM | POA: Diagnosis not present

## 2015-11-19 DIAGNOSIS — R262 Difficulty in walking, not elsewhere classified: Secondary | ICD-10-CM | POA: Diagnosis not present

## 2015-11-19 HISTORY — PX: OTHER SURGICAL HISTORY: SHX169

## 2015-11-20 DIAGNOSIS — M6281 Muscle weakness (generalized): Secondary | ICD-10-CM | POA: Diagnosis not present

## 2015-11-20 DIAGNOSIS — R262 Difficulty in walking, not elsewhere classified: Secondary | ICD-10-CM | POA: Diagnosis not present

## 2015-11-20 DIAGNOSIS — N183 Chronic kidney disease, stage 3 (moderate): Secondary | ICD-10-CM | POA: Diagnosis not present

## 2015-11-21 DIAGNOSIS — R262 Difficulty in walking, not elsewhere classified: Secondary | ICD-10-CM | POA: Diagnosis not present

## 2015-11-21 DIAGNOSIS — M6281 Muscle weakness (generalized): Secondary | ICD-10-CM | POA: Diagnosis not present

## 2015-11-21 DIAGNOSIS — N183 Chronic kidney disease, stage 3 (moderate): Secondary | ICD-10-CM | POA: Diagnosis not present

## 2015-11-22 DIAGNOSIS — R262 Difficulty in walking, not elsewhere classified: Secondary | ICD-10-CM | POA: Diagnosis not present

## 2015-11-22 DIAGNOSIS — N183 Chronic kidney disease, stage 3 (moderate): Secondary | ICD-10-CM | POA: Diagnosis not present

## 2015-11-22 DIAGNOSIS — M6281 Muscle weakness (generalized): Secondary | ICD-10-CM | POA: Diagnosis not present

## 2015-11-25 DIAGNOSIS — N183 Chronic kidney disease, stage 3 (moderate): Secondary | ICD-10-CM | POA: Diagnosis not present

## 2015-11-25 DIAGNOSIS — M6281 Muscle weakness (generalized): Secondary | ICD-10-CM | POA: Diagnosis not present

## 2015-11-25 DIAGNOSIS — R262 Difficulty in walking, not elsewhere classified: Secondary | ICD-10-CM | POA: Diagnosis not present

## 2015-11-26 DIAGNOSIS — N183 Chronic kidney disease, stage 3 (moderate): Secondary | ICD-10-CM | POA: Diagnosis not present

## 2015-11-26 DIAGNOSIS — R262 Difficulty in walking, not elsewhere classified: Secondary | ICD-10-CM | POA: Diagnosis not present

## 2015-11-26 DIAGNOSIS — M6281 Muscle weakness (generalized): Secondary | ICD-10-CM | POA: Diagnosis not present

## 2015-11-27 DIAGNOSIS — R262 Difficulty in walking, not elsewhere classified: Secondary | ICD-10-CM | POA: Diagnosis not present

## 2015-11-27 DIAGNOSIS — M6281 Muscle weakness (generalized): Secondary | ICD-10-CM | POA: Diagnosis not present

## 2015-11-27 DIAGNOSIS — N183 Chronic kidney disease, stage 3 (moderate): Secondary | ICD-10-CM | POA: Diagnosis not present

## 2015-11-29 DIAGNOSIS — M6281 Muscle weakness (generalized): Secondary | ICD-10-CM | POA: Diagnosis not present

## 2015-11-29 DIAGNOSIS — R262 Difficulty in walking, not elsewhere classified: Secondary | ICD-10-CM | POA: Diagnosis not present

## 2015-11-29 DIAGNOSIS — N183 Chronic kidney disease, stage 3 (moderate): Secondary | ICD-10-CM | POA: Diagnosis not present

## 2015-11-30 DIAGNOSIS — R262 Difficulty in walking, not elsewhere classified: Secondary | ICD-10-CM | POA: Diagnosis not present

## 2015-11-30 DIAGNOSIS — M6281 Muscle weakness (generalized): Secondary | ICD-10-CM | POA: Diagnosis not present

## 2015-11-30 DIAGNOSIS — N183 Chronic kidney disease, stage 3 (moderate): Secondary | ICD-10-CM | POA: Diagnosis not present

## 2015-12-02 DIAGNOSIS — N183 Chronic kidney disease, stage 3 (moderate): Secondary | ICD-10-CM | POA: Diagnosis not present

## 2015-12-02 DIAGNOSIS — M6281 Muscle weakness (generalized): Secondary | ICD-10-CM | POA: Diagnosis not present

## 2015-12-02 DIAGNOSIS — R262 Difficulty in walking, not elsewhere classified: Secondary | ICD-10-CM | POA: Diagnosis not present

## 2015-12-03 DIAGNOSIS — M6281 Muscle weakness (generalized): Secondary | ICD-10-CM | POA: Diagnosis not present

## 2015-12-03 DIAGNOSIS — R262 Difficulty in walking, not elsewhere classified: Secondary | ICD-10-CM | POA: Diagnosis not present

## 2015-12-03 DIAGNOSIS — N183 Chronic kidney disease, stage 3 (moderate): Secondary | ICD-10-CM | POA: Diagnosis not present

## 2015-12-05 DIAGNOSIS — H25812 Combined forms of age-related cataract, left eye: Secondary | ICD-10-CM | POA: Diagnosis not present

## 2015-12-05 DIAGNOSIS — H2512 Age-related nuclear cataract, left eye: Secondary | ICD-10-CM | POA: Diagnosis not present

## 2015-12-23 ENCOUNTER — Encounter: Payer: Self-pay | Admitting: Adult Health

## 2015-12-23 ENCOUNTER — Non-Acute Institutional Stay (SKILLED_NURSING_FACILITY): Payer: Medicare Other | Admitting: Adult Health

## 2015-12-23 DIAGNOSIS — F0151 Vascular dementia with behavioral disturbance: Secondary | ICD-10-CM

## 2015-12-23 DIAGNOSIS — J438 Other emphysema: Secondary | ICD-10-CM | POA: Diagnosis not present

## 2015-12-23 DIAGNOSIS — F329 Major depressive disorder, single episode, unspecified: Secondary | ICD-10-CM

## 2015-12-23 DIAGNOSIS — K5900 Constipation, unspecified: Secondary | ICD-10-CM

## 2015-12-23 DIAGNOSIS — E1151 Type 2 diabetes mellitus with diabetic peripheral angiopathy without gangrene: Secondary | ICD-10-CM

## 2015-12-23 DIAGNOSIS — I5032 Chronic diastolic (congestive) heart failure: Secondary | ICD-10-CM

## 2015-12-23 DIAGNOSIS — E1169 Type 2 diabetes mellitus with other specified complication: Secondary | ICD-10-CM

## 2015-12-23 DIAGNOSIS — K219 Gastro-esophageal reflux disease without esophagitis: Secondary | ICD-10-CM | POA: Diagnosis not present

## 2015-12-23 DIAGNOSIS — E785 Hyperlipidemia, unspecified: Secondary | ICD-10-CM

## 2015-12-23 DIAGNOSIS — F0393 Unspecified dementia, unspecified severity, with mood disturbance: Secondary | ICD-10-CM

## 2015-12-23 DIAGNOSIS — F0153 Vascular dementia, unspecified severity, with mood disturbance: Secondary | ICD-10-CM

## 2015-12-23 DIAGNOSIS — I110 Hypertensive heart disease with heart failure: Secondary | ICD-10-CM

## 2015-12-23 DIAGNOSIS — Z933 Colostomy status: Secondary | ICD-10-CM | POA: Diagnosis not present

## 2015-12-23 DIAGNOSIS — F0280 Dementia in other diseases classified elsewhere without behavioral disturbance: Secondary | ICD-10-CM

## 2015-12-23 DIAGNOSIS — K5909 Other constipation: Secondary | ICD-10-CM

## 2015-12-23 NOTE — Progress Notes (Signed)
Patient ID: Sheila Flynn, female   DOB: [birth year], 70 y.o.   MRN: [id number]   [facility]       Allergies  Allergen Reactions  . Ace Inhibitors Swelling    Chief Complaint  Patient presents with  . Annual Exam    Yearly exam    HPI:  She is a long term resident of this facility being seen for her annual exam. She has done well over the past year. She has not been hospitalized. She tells me that she is feeling good and has no complaints. There are no nursing concerns at this time.   Past Medical History  Diagnosis Date  . 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 Chronic diastolic CHF (congestive heart failure) ([place])   . CAD (coronary artery disease)     Dr. Matthew Saras  . Angina at rest [facility])   . Hyperlipidemia LDL goal < 100   . Aspiration pneumonia ([place])   . COPD (chronic obstructive pulmonary disease) ([place])   . Acute respiratory failure ([place]) 12/21/10  . Pulmonary hypertension ([place])   . Chronic constipation   . Rectal cancer ([place])     s/p colostomy  . Thyroid goiter   . 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 Vascular dementia with depressed mood   . Pneumonia 12/21/10  . Delirium, induced by drug ([place])     polypharmacy  . Sacral fracture, closed ([place])     s/p kyphoplasty  . Fall     history of falls  . Dementia   . GERD (gastroesophageal reflux disease)   . Major depressive disorder ([place])   . CKD (chronic kidney disease)   . Myocardial infarction ([place])     "I've had about 3" (11/06/2014)  . DM (diabetes mellitus) type II controlled peripheral vascular disorder ([place])   . On home oxygen therapy     "prn at the nursing home" (11/06/2014)  . Migraine     "monthly" (11/06/2014)  . Stroke [facility]) 2011    postop  . Stroke [facility]) ?2014    "left side sometimes weaker since" (11/06/2014)    Past Surgical History  Procedure Laterality Date  . Kyphoplasty      sacrum  .  Colostomy      rectal ca  . Appendectomy    . Tonsillectomy    . Abdominal hysterectomy    . Tubal ligation    . Colon surgery      History reviewed. No pertinent family history.  Social History   Social History  . Marital Status: Widowed    Spouse Name: N/A  . Number of Children: N/A  . Years of Education: N/A   Occupational History  .      custodial   Social History Main Topics  . Smoking status: Former Smoker -- 0.50 packs/day for 2 years    Types: Cigarettes  . Smokeless tobacco: Never Used     Comment: "quit smoking cigarettes in 1969"  . Alcohol Use: No  . Drug Use: Yes     Comment: previously smoked crack, has had positive UDS for amphetamines and narcotics  . Sexual Activity: No   Other Topics Concern  . Not on file   Social History Narrative   Born in [place], has some high school education, retired from custodian job, divorced, has children     VITAL SIGNS BP 133/71 mmHg  Pulse 87  Temp(Src) 97.7 F (  36.5 C) (Oral)  Resp 18  Ht 5\' 7"  (1.702 m)  Wt 180 lb (81.647 kg)  BMI 28.19 kg/m2  SpO2 95%  Patient's Medications  New Prescriptions   No medications on file  Previous Medications   ACETAMINOPHEN (TYLENOL) 325 MG TABLET    Take 650 mg by mouth every 6 (six) hours as needed for mild pain.   AMLODIPINE (NORVASC) 10 MG TABLET    Take 10 mg by mouth daily.   ASPIRIN (ASPIRIN EC) 81 MG EC TABLET    Take 81 mg by mouth daily. Swallow whole.   ATORVASTATIN (LIPITOR) 80 MG TABLET    Take 1 tablet (80 mg total) by mouth daily.   BUSPIRONE (BUSPAR) 5 MG TABLET    Take 5 mg by mouth. Take 1 tablet by mouth every 12 hrs prn.   CALCITRIOL (ROCALTROL) 0.25 MCG CAPSULE    Take 0.25 mcg by mouth daily.   CARVEDILOL (COREG) 6.25 MG TABLET    Take 6.25 mg by mouth 2 (two) times daily with a meal.   CHOLECALCIFEROL 50000 UNITS TABS    Take 50,000 Units by mouth. Take one tablet by mouth one time a day  Starting on the 14 th and ending on the 14 th every  month.   DIFLUPREDNATE (DUREZOL OP)    Apply 1 drop to eye. Instill 1 drop in left eye three times a day.   DOCUSATE SODIUM (COLACE) 100 MG CAPSULE    Take 100 mg by mouth 3 (three) times daily.   GLIPIZIDE (GLUCOTROL) 5 MG TABLET    Take 0.5 tablets (2.5 mg total) by mouth daily before breakfast.   HYDRALAZINE (APRESOLINE) 25 MG TABLET    Take 25 mg by mouth 2 (two) times daily.    IPRATROPIUM-ALBUTEROL (DUONEB) 0.5-2.5 (3) MG/3ML SOLN    Take 3 mLs by nebulization every 6 (six) hours as needed (shortness of breathe/wheezing).   ISOSORBIDE MONONITRATE (ISMO,MONOKET) 20 MG TABLET    Take 60 mg by mouth daily.    LOSARTAN (COZAAR) 25 MG TABLET    Take 2 tablets (50 mg total) by mouth daily.   NEPAFENAC (ILEVRO) 0.3 % OPHTHALMIC SUSPENSION    Place 1 drop into the left eye at bedtime.   NITROGLYCERIN (NITROSTAT) 0.4 MG SL TABLET    Place 0.4 mg under the tongue every 5 (five) minutes as needed for chest pain.   OMEPRAZOLE (PRILOSEC) 20 MG CAPSULE    Take 20 mg by mouth daily.    PAROXETINE (PAXIL) 20 MG TABLET    Take by mouth. Take 1.5 tablet by mouth daily.   PREDNISOLONE 5 MG TABS TABLET    Take 5 mg by mouth daily.   PROMETHAZINE (PHENERGAN) 25 MG TABLET    Take 25 mg by mouth every 6 (six) hours as needed for nausea or vomiting.   PROMETHAZINE (PHENERGAN) 25 MG/ML INJECTION    Inject 25 mg into the vein every 6 (six) hours as needed for nausea or vomiting.   SENNOSIDES-DOCUSATE SODIUM (SENOKOT-S) 8.6-50 MG TABLET    Take 2 tablets by mouth every evening.    TRAMADOL (ULTRAM) 50 MG TABLET    Take one tablet by mouth every 8 hours as needed for pain   TRAZODONE (DESYREL) 50 MG TABLET    Take 50 mg by mouth at bedtime.  Modified Medications   No medications on file  Discontinued Medications     SIGNIFICANT DIAGNOSTIC EXAMS  11-06-14: chest x-ray: Probable left lower lobe retrocardiac  opacity, atelectasis versuspneumonia. Grossly stable cardiomegaly allowing for differences in  technique.   LABS REVIEWED:   03-11-15: wbc 5.2; hgb 11.9; hct 37.2; mcv 81; plt 287; chol 198; ldl 127; trig 122; hdl 42; hgb a1c 6.7;  vit d 24 04-22-15: glucose 62; bun 18; creat 1.40; k+ 3.5 ;na++ 141 07-22-15: chol 179; ldl 104; trig 112; hdl 53; liver normal albumin 4.0 09-04-15: hgb a1c 7.2; urine micro-albumin 11.3 11-22-14: glucose 61; bun 21; creat 1.41; k+ 3.7; na++144; phos 4.7; pth 67; ca++9.2       Review of Systems Constitutional: Negative for appetite change and fatigue.  HENT: Negative for congestion.   Respiratory: Negative for cough, chest tightness and shortness of breath.   Cardiovascular: Negative for chest pain, palpitations and leg swelling.  Gastrointestinal: Negative for nausea, abdominal pain, diarrhea and constipation.  Musculoskeletal: Negative for myalgias and arthralgias.  Skin: Negative for pallor.  Neurological: Negative for dizziness.  Psychiatric/Behavioral: The patient is not nervous/anxious.       Physical Exam Constitutional: No distress.  Eyes: Conjunctivae are normal.  Neck: Neck supple. No JVD present. No thyromegaly present.  Cardiovascular: Normal rate, regular rhythm and intact distal pulses.   Respiratory: Effort normal and breath sounds normal. No respiratory distress. She has no wheezes.  GI: Soft. Bowel sounds are normal. She exhibits no distension. There is no tenderness.  Has colostomy   Musculoskeletal: She exhibits no edema.  Able to move all extremities   Lymphadenopathy:    She has no cervical adenopathy.  Neurological: She is alert.  Skin: Skin is warm and dry. She is not diaphoretic.  Psychiatric: She has a normal mood and affect.      ASSESSMENT/ PLAN:  1. Hypertension: will continue norvasc 10 mg daily;  hydralazine 25 mg twice daily cozaar  50 mg daily  will continue coreg  6.25 mg twice daily   2. Dyslipidemia: will continue lipitor  80 mg daily  ldl is 104  3. Diabetes: will increase her  glipizide to 2.5 mg  twice daily hgb a1c is 6.7  4. COPD: is stable will continue duoneb every 6 hours as needed; will continue prednisone 5 mg daily and will monitor his status.   5. CAD: no complaints of chest pain present will continue imdur 60 mg daily; asa 81 mg daily and prn ntg; will monitor  6. Jerrye Bushy: will continue  prilosec 20 mg daily    7. Constipation: will continue colace three times daily; and senna s 2 tabs daily   8. Depression: will continue paxil 30 mg daily   9.  CVA: is neurologically stable; will continue asa 81 mg daily   10. Vascular dementia: on change in her status; is presently not on medications; will not make changes will monitor her status.   11. Diastolic heart failure: will continue  hydralazine 25 mg twice daily imdur 60 mg daily  Will continue coreg  6.25 mg twice daily and will monitor her status.    Time spent with patient  40  minutes >50% time spent counseling; reviewing medical record; tests; labs; and developing future plan of care   Ok Edwards NP [facility]  Contact [phone] Monday through Friday 8am- 5pm  After hours call [phone]

## 2016-01-02 DIAGNOSIS — E119 Type 2 diabetes mellitus without complications: Secondary | ICD-10-CM | POA: Diagnosis not present

## 2016-01-02 DIAGNOSIS — B351 Tinea unguium: Secondary | ICD-10-CM | POA: Diagnosis not present

## 2016-01-02 DIAGNOSIS — I2510 Atherosclerotic heart disease of native coronary artery without angina pectoris: Secondary | ICD-10-CM | POA: Diagnosis not present

## 2016-01-02 DIAGNOSIS — M79671 Pain in right foot: Secondary | ICD-10-CM | POA: Diagnosis not present

## 2016-01-03 ENCOUNTER — Encounter: Payer: Self-pay | Admitting: Adult Health

## 2016-01-03 DIAGNOSIS — Z933 Colostomy status: Secondary | ICD-10-CM | POA: Insufficient documentation

## 2016-01-03 NOTE — Progress Notes (Signed)
Patient ID: Sheila Flynn, female   DOB: [birth year], 70 y.o.   MRN: [id number]    Facility: [facility]       Allergies  Allergen Reactions  . Ace Inhibitors Swelling    Chief Complaint  Patient presents with  . Medical Management of Chronic Issues    HPI:  She is a long term resident of this facility being seen for the management of her chronic illnesses. Overall there is little change in her status. She is not voicing any complaints or concerns. She tells me that she is feeling good. There are nursing concerns at this time. She has had right cataract surgery yesterday.     Past Medical History  Diagnosis Date  . 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 Chronic diastolic CHF (congestive heart failure) ([place])   . CAD (coronary artery disease)     Dr. Matthew Saras  . Angina at rest [facility])   . Hyperlipidemia LDL goal < 100   . Aspiration pneumonia ([place])   . COPD (chronic obstructive pulmonary disease) ([place])   . Acute respiratory failure ([place]) 12/21/10  . Pulmonary hypertension ([place])   . Chronic constipation   . Rectal cancer ([place])     s/p colostomy  . Thyroid goiter   . 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 Vascular dementia with depressed mood   . Pneumonia 12/21/10  . Delirium, induced by drug ([place])     polypharmacy  . Sacral fracture, closed ([place])     s/p kyphoplasty  . Fall     history of falls  . Dementia   . GERD (gastroesophageal reflux disease)   . Major depressive disorder ([place])   . CKD (chronic kidney disease)   . Myocardial infarction ([place])     "I've had about 3" (11/06/2014)  . DM (diabetes mellitus) type II controlled peripheral vascular disorder ([place])   . On home oxygen therapy     "prn at the nursing home" (11/06/2014)  . Migraine     "monthly" (11/06/2014)  . Stroke [facility]) 2011    postop  . Stroke [facility]) ?2014    "left side sometimes weaker since" (11/06/2014)     Past Surgical History  Procedure Laterality Date  . Kyphoplasty      sacrum  . Colostomy      rectal ca  . Appendectomy    . Tonsillectomy    . Abdominal hysterectomy    . Tubal ligation    . Colon surgery    . Right cataract  11-14-2015      VITAL SIGNS BP 132/68 mmHg  Pulse 60  Ht 5\' 7"  (1.702 m)  Wt 182 lb (82.555 kg)  BMI 28.50 kg/m2  SpO2 95%  Patient's Medications  New Prescriptions   No medications on file  Previous Medications   ACETAMINOPHEN (TYLENOL) 325 MG TABLET    Take 650 mg by mouth every 6 (six) hours as needed for mild pain.   AMLODIPINE (NORVASC) 10 MG TABLET    Take 10 mg by mouth daily.   ASPIRIN (ASPIRIN EC) 81 MG EC TABLET    Take 81 mg by mouth daily. Swallow whole.   ATORVASTATIN (LIPITOR) 80 MG TABLET    Take 1 tablet (80 mg total) by mouth daily.   BUSPIRONE (BUSPAR) 5 MG TABLET    Take 5 mg by mouth. Take 1 tablet by mouth every 12 hrs prn.   CALCITRIOL (ROCALTROL) 0.25  MCG CAPSULE    Take 0.25 mcg by mouth daily.   CARVEDILOL (COREG) 6.25 MG TABLET    Take 6.25 mg by mouth 2 (two) times daily with a meal.   CHOLECALCIFEROL 50000 UNITS TABS    Take 50,000 Units by mouth. Take one tablet by mouth one time a day  Starting on the 14 th and ending on the 14 th every month.   DIFLUPREDNATE (DUREZOL OP)    Apply 1 drop to eye. Instill 1 drop in left eye three times a day.   DOCUSATE SODIUM (COLACE) 100 MG CAPSULE    Take 100 mg by mouth 3 (three) times daily.   GLIPIZIDE (GLUCOTROL) 5 MG TABLET    Take 0.5 tablets (2.5 mg total) by mouth daily before breakfast.   HYDRALAZINE (APRESOLINE) 25 MG TABLET    Take 25 mg by mouth 2 (two) times daily.    IPRATROPIUM-ALBUTEROL (DUONEB) 0.5-2.5 (3) MG/3ML SOLN    Take 3 mLs by nebulization every 6 (six) hours as needed (shortness of breathe/wheezing).   ISOSORBIDE MONONITRATE (ISMO,MONOKET) 20 MG TABLET    Take 60 mg by mouth daily.    LOSARTAN (COZAAR) 25 MG TABLET    Take 2 tablets (50 mg total) by mouth  daily.   NEPAFENAC (ILEVRO) 0.3 % OPHTHALMIC SUSPENSION    Place 1 drop into the left eye at bedtime.   NITROGLYCERIN (NITROSTAT) 0.4 MG SL TABLET    Place 0.4 mg under the tongue every 5 (five) minutes as needed for chest pain.   OMEPRAZOLE (PRILOSEC) 20 MG CAPSULE    Take 20 mg by mouth daily.    PAROXETINE (PAXIL) 20 MG TABLET    Take by mouth. Take 1.5 tablet by mouth daily.   PREDNISOLONE 5 MG TABS TABLET    Take 5 mg by mouth daily.   PROMETHAZINE (PHENERGAN) 25 MG TABLET    Take 25 mg by mouth every 6 (six) hours as needed for nausea or vomiting.   PROMETHAZINE (PHENERGAN) 25 MG/ML INJECTION    Inject 25 mg into the vein every 6 (six) hours as needed for nausea or vomiting.   SENNOSIDES-DOCUSATE SODIUM (SENOKOT-S) 8.6-50 MG TABLET    Take 2 tablets by mouth every evening.    TRAMADOL (ULTRAM) 50 MG TABLET    Take one tablet by mouth every 8 hours as needed for pain   TRAZODONE (DESYREL) 50 MG TABLET    Take 50 mg by mouth at bedtime.  Modified Medications   No medications on file  Discontinued Medications   No medications on file     SIGNIFICANT DIAGNOSTIC EXAMS   11-06-14: chest x-ray: Probable left lower lobe retrocardiac opacity, atelectasis versuspneumonia. Grossly stable cardiomegaly allowing for differences in technique.   LABS REVIEWED:   11-12-14: wbc 5.5; hgb 11.5; hct 36.4; mcv 81.4; plt 237; glucose 50; bun 21; creat 1.4; k+4.2; na++140; liver normal albumin 3.5; tsh 0.487; hgb a1c 6.1  03-11-15: wbc 5.2; hgb 11.9; hct 37.2; mcv 81; plt 287; chol 198; ldl 127; trig 122; hdl 42; hgb a1c 6.7;  vit d 24 04-22-15: glucose 62; bun 18; creat 1.40; k+ 3.5 ;na++ 141 07-22-15: chol 179; ldl 104; trig 112; hdl 53; liver normal albumin 4.0   09-04-15: hgb a1c 7.2; urine micro-albumin 11.3 11-22-14: glucose 61; bun 21; creat 1.41; k+ 3.7; na++144; phos 4.7; pth 67; ca++9.2   Review of Systems Constitutional: Negative for appetite change and fatigue.  HENT: Negative for congestion.    Respiratory:  Negative for cough, chest tightness and shortness of breath.   Cardiovascular: Negative for chest pain, palpitations and leg swelling.  Gastrointestinal: Negative for nausea, abdominal pain, diarrhea and constipation.  Musculoskeletal: Negative for myalgias and arthralgias.  Skin: Negative for pallor.  Neurological: Negative for dizziness.  Psychiatric/Behavioral: The patient is not nervous/anxious.       Physical Exam Constitutional: No distress.  Eyes: Conjunctivae are normal.  Neck: Neck supple. No JVD present. No thyromegaly present.  Cardiovascular: Normal rate, regular rhythm and intact distal pulses.   Respiratory: Effort normal and breath sounds normal. No respiratory distress. She has no wheezes.  GI: Soft. Bowel sounds are normal. She exhibits no distension. There is no tenderness.  Has colostomy   Musculoskeletal: She exhibits no edema.  Able to move all extremities   Lymphadenopathy:    She has no cervical adenopathy.  Neurological: She is alert.  Skin: Skin is warm and dry. She is not diaphoretic.  Psychiatric: She has a normal mood and affect.      ASSESSMENT/ PLAN:  1. Hypertension: will continue norvasc 10 mg daily;  hydralazine 25 mg twice daily cozaar  50 mg daily  will continue coreg  6.25 mg twice daily   2. Dyslipidemia: will continue lipitor  80 mg daily  ldl is 104  3. Diabetes: will continue glipizide xl 5 mg daily  hgb a1c is 7.2 urine micro-albumin 11.3 is on arb; statin asa   4. COPD: is stable will continue duoneb every 6 hours as needed; will continue prednisone 5 mg daily and will monitor his status.   5. CAD: no complaints of chest pain present will continue imdur 60 mg daily; asa 81 mg daily and prn ntg; will monitor  6. Jerrye Bushy: will continue  prilosec 20 mg daily    7. Constipation: will continue colace three times daily; and senna s 2 tabs daily   8. Depression: will continue paxil 30 mg daily   9.  CVA: is neurologically  stable; will continue asa 81 mg daily   10. Vascular dementia: on change in her status; is presently not on medications; will not make changes will monitor her status.   11. Diastolic heart failure: will continue  hydralazine 25 mg twice daily imdur 60 mg daily  Will continue  coreg  6.25 mg twice daily and will monitor her status.          Ok Edwards NP [facility]  Contact [phone] Monday through Friday 8am- 5pm  After hours call [phone]

## 2016-01-23 ENCOUNTER — Non-Acute Institutional Stay (SKILLED_NURSING_FACILITY): Payer: Medicare Other | Admitting: Adult Health

## 2016-01-23 ENCOUNTER — Encounter: Payer: Self-pay | Admitting: Adult Health

## 2016-01-23 DIAGNOSIS — E785 Hyperlipidemia, unspecified: Secondary | ICD-10-CM | POA: Diagnosis not present

## 2016-01-23 DIAGNOSIS — I110 Hypertensive heart disease with heart failure: Secondary | ICD-10-CM

## 2016-01-23 DIAGNOSIS — I6350 Cerebral infarction due to unspecified occlusion or stenosis of unspecified cerebral artery: Secondary | ICD-10-CM

## 2016-01-23 DIAGNOSIS — E1151 Type 2 diabetes mellitus with diabetic peripheral angiopathy without gangrene: Secondary | ICD-10-CM | POA: Diagnosis not present

## 2016-01-23 DIAGNOSIS — F0153 Vascular dementia, unspecified severity, with mood disturbance: Secondary | ICD-10-CM

## 2016-01-23 DIAGNOSIS — I5032 Chronic diastolic (congestive) heart failure: Secondary | ICD-10-CM

## 2016-01-23 DIAGNOSIS — K219 Gastro-esophageal reflux disease without esophagitis: Secondary | ICD-10-CM | POA: Diagnosis not present

## 2016-01-23 DIAGNOSIS — K5900 Constipation, unspecified: Secondary | ICD-10-CM

## 2016-01-23 DIAGNOSIS — F329 Major depressive disorder, single episode, unspecified: Secondary | ICD-10-CM | POA: Diagnosis not present

## 2016-01-23 DIAGNOSIS — F0151 Vascular dementia with behavioral disturbance: Secondary | ICD-10-CM

## 2016-01-23 DIAGNOSIS — J438 Other emphysema: Secondary | ICD-10-CM

## 2016-01-23 DIAGNOSIS — K5909 Other constipation: Secondary | ICD-10-CM

## 2016-01-23 DIAGNOSIS — E1169 Type 2 diabetes mellitus with other specified complication: Secondary | ICD-10-CM

## 2016-01-23 NOTE — Progress Notes (Signed)
Patient ID: Sheila Flynn, female   DOB: 14-Feb-1946, 70 y.o.   MRN: [id number]   [facility]       Allergies  Allergen Reactions  . Ace Inhibitors Swelling    Chief Complaint  Patient presents with  . Medical Management of Chronic Issues    Follow up    HPI:  She is a long term resident of this facility being seen for the management of her chronic illnesses. Overall her status is stable. She tells me that she is doing well. There are no nursing concerns at this time.    Past Medical History  Diagnosis Date  . 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 Chronic diastolic CHF (congestive heart failure) ([place])   . CAD (coronary artery disease)     Dr. Matthew Saras  . Angina at rest [facility])   . Hyperlipidemia LDL goal < 100   . Aspiration pneumonia ([place])   . COPD (chronic obstructive pulmonary disease) ([place])   . Acute respiratory failure ([place]) 12/21/10  . Pulmonary hypertension ([place])   . Chronic constipation   . Rectal cancer ([place])     s/p colostomy  . Thyroid goiter   . 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 Vascular dementia with depressed mood   . Pneumonia 12/21/10  . Delirium, induced by drug ([place])     polypharmacy  . Sacral fracture, closed ([place])     s/p kyphoplasty  . Fall     history of falls  . Dementia   . GERD (gastroesophageal reflux disease)   . Major depressive disorder ([place])   . CKD (chronic kidney disease)   . Myocardial infarction ([place])     "I've had about 3" (11/06/2014)  . DM (diabetes mellitus) type II controlled peripheral vascular disorder ([place])   . On home oxygen therapy     "prn at the nursing home" (11/06/2014)  . Migraine     "monthly" (11/06/2014)  . Stroke [facility]) 2011    postop  . Stroke [facility]) ?2014    "left side sometimes weaker since" (11/06/2014)    Past Surgical History  Procedure Laterality Date  . Kyphoplasty      sacrum  . Colostomy     rectal ca  . Appendectomy    . Tonsillectomy    . Abdominal hysterectomy    . Tubal ligation    . Colon surgery    . Right cataract  11-19-2015    VITAL SIGNS BP 161/79 mmHg  Pulse 99  Temp(Src) 98.5 F (36.9 C) (Oral)  Resp 18  Ht 5\' 7"  (1.702 m)  Wt 162 lb 8 oz (73.71 kg)  BMI 25.45 kg/m2  SpO2 95%  Patient's Medications  New Prescriptions   No medications on file  Previous Medications   ACETAMINOPHEN (TYLENOL) 325 MG TABLET    Take 650 mg by mouth every 6 (six) hours as needed for mild pain.   AMLODIPINE (NORVASC) 10 MG TABLET    Take 10 mg by mouth daily.   ASPIRIN (ASPIRIN EC) 81 MG EC TABLET    Take 81 mg by mouth daily. Swallow whole.   ATORVASTATIN (LIPITOR) 80 MG TABLET    Take 1 tablet (80 mg total) by mouth daily.   BUSPIRONE (BUSPAR) 5 MG TABLET    Take 5 mg by mouth. Take 1 tablet by mouth every 12 hrs prn.   CALCITRIOL (ROCALTROL) 0.25 MCG CAPSULE    Take  0.25 mcg by mouth daily.   CARVEDILOL (COREG) 6.25 MG TABLET    Take 6.25 mg by mouth 2 (two) times daily with a meal.   CHOLECALCIFEROL 50000 UNITS TABS    Take 50,000 Units by mouth. Take one tablet by mouth one time a day  Starting on the 14 th and ending on the 14 th every month.   DOCUSATE SODIUM (COLACE) 100 MG CAPSULE    Take 100 mg by mouth 3 (three) times daily.   GLIPIZIDE (GLUCOTROL) 5 MG TABLET    Take 0.5 tablets (2.5 mg total) by mouth daily before breakfast.   HYDRALAZINE (APRESOLINE) 25 MG TABLET    Take 25 mg by mouth 2 (two) times daily.    IPRATROPIUM-ALBUTEROL (DUONEB) 0.5-2.5 (3) MG/3ML SOLN    Take 3 mLs by nebulization every 6 (six) hours as needed (shortness of breathe/wheezing).   ISOSORBIDE MONONITRATE (ISMO,MONOKET) 20 MG TABLET    Take 60 mg by mouth daily.    LOSARTAN (COZAAR) 25 MG TABLET    Take 2 tablets (50 mg total) by mouth daily.   NEPAFENAC (ILEVRO) 0.3 % OPHTHALMIC SUSPENSION    Place 1 drop into the left eye at bedtime.   NITROGLYCERIN (NITROSTAT) 0.4 MG SL TABLET    Place  0.4 mg under the tongue every 5 (five) minutes as needed for chest pain.   OMEPRAZOLE (PRILOSEC) 20 MG CAPSULE    Take 20 mg by mouth daily.    PAROXETINE (PAXIL) 20 MG TABLET    Take by mouth. Take 1.5 tablet by mouth daily.   PREDNISOLONE 5 MG TABS TABLET    Take 5 mg by mouth daily. Reported on 01/23/2016   PROMETHAZINE (PHENERGAN) 25 MG TABLET    Take 25 mg by mouth every 6 (six) hours as needed for nausea or vomiting. Reported on 01/23/2016   PROMETHAZINE (PHENERGAN) 25 MG/ML INJECTION    Inject 25 mg into the vein every 6 (six) hours as needed for nausea or vomiting.   SENNOSIDES-DOCUSATE SODIUM (SENOKOT-S) 8.6-50 MG TABLET    Take 2 tablets by mouth every evening.    TRAMADOL (ULTRAM) 50 MG TABLET    Take one tablet by mouth every 8 hours as needed for pain   TRAZODONE (DESYREL) 50 MG TABLET    Take 50 mg by mouth at bedtime.  Modified Medications   No medications on file  Discontinued Medications     SIGNIFICANT DIAGNOSTIC EXAMS  11-06-14: chest x-ray: Probable left lower lobe retrocardiac opacity, atelectasis versuspneumonia. Grossly stable cardiomegaly allowing for differences in technique.   LABS REVIEWED:   03-11-15: wbc 5.2; hgb 11.9; hct 37.2; mcv 81; plt 287; chol 198; ldl 127; trig 122; hdl 42; hgb a1c 6.7;  vit d 24 04-22-15: glucose 62; bun 18; creat 1.40; k+ 3.5 ;na++ 141 07-22-15: chol 179; ldl 104; trig 112; hdl 53; liver normal albumin 4.0 09-04-15: hgb a1c 7.2; urine micro-albumin 11.3 11-22-14: glucose 61; bun 21; creat 1.41; k+ 3.7; na++144; phos 4.7; pth 67; ca++9.2       Review of Systems Constitutional: Negative for appetite change and fatigue.  HENT: Negative for congestion.   Respiratory: Negative for cough, chest tightness and shortness of breath.   Cardiovascular: Negative for chest pain, palpitations and leg swelling.  Gastrointestinal: Negative for nausea, abdominal pain, diarrhea and constipation.  Musculoskeletal: Negative for myalgias and  arthralgias.  Skin: Negative for pallor.  Neurological: Negative for dizziness.  Psychiatric/Behavioral: The patient is not nervous/anxious.  Physical Exam Constitutional: No distress.  Eyes: Conjunctivae are normal.  Neck: Neck supple. No JVD present. No thyromegaly present.  Cardiovascular: Normal rate, regular rhythm and intact distal pulses.   Respiratory: Effort normal and breath sounds normal. No respiratory distress. She has no wheezes.  GI: Soft. Bowel sounds are normal. She exhibits no distension. There is no tenderness.  Has colostomy   Musculoskeletal: She exhibits no edema.  Able to move all extremities   Lymphadenopathy:    She has no cervical adenopathy.  Neurological: She is alert.  Skin: Skin is warm and dry. She is not diaphoretic.  Psychiatric: She has a normal mood and affect.      ASSESSMENT/ PLAN:  1. Hypertension: will continue norvasc 10 mg daily;  hydralazine 25 mg twice daily cozaar  50 mg daily  will continue coreg  6.25 mg twice daily   2. Dyslipidemia: will continue lipitor  80 mg daily  ldl is 104  3. Diabetes: will increase her  glipizide to 2.5 mg twice daily hgb a1c is 6.7  4. COPD: is stable will continue duoneb every 6 hours as needed; will continue prednisone 5 mg daily and will monitor his status.   5. CAD: no complaints of chest pain present will continue imdur 60 mg daily; asa 81 mg daily and prn ntg; will monitor  6. Jerrye Bushy: will continue  prilosec 20 mg daily    7. Constipation: will continue colace three times daily; and senna s 2 tabs daily   8. Depression: will continue paxil 30 mg daily   9.  CVA: is neurologically stable; will continue asa 81 mg daily   10. Vascular dementia: on change in her status; is presently not on medications; will not make changes will monitor her status.   11. Diastolic heart failure: will continue  hydralazine 25 mg twice daily imdur 60 mg daily  Will continue coreg  6.25 mg twice daily and will  monitor her status.      Will check cbc; cmp; lipids tsh hgb a1c; pth    Ok Edwards NP [facility]  Contact [phone] Monday through Friday 8am- 5pm  After hours call [phone]

## 2016-01-24 DIAGNOSIS — E039 Hypothyroidism, unspecified: Secondary | ICD-10-CM | POA: Diagnosis not present

## 2016-01-24 DIAGNOSIS — N189 Chronic kidney disease, unspecified: Secondary | ICD-10-CM | POA: Diagnosis not present

## 2016-01-24 DIAGNOSIS — E089 Diabetes mellitus due to underlying condition without complications: Secondary | ICD-10-CM | POA: Diagnosis not present

## 2016-01-24 LAB — TSH: TSH: 0.54 u[IU]/mL (ref 0.41–5.90)

## 2016-01-24 LAB — CBC AND DIFFERENTIAL
HCT: 38 % (ref 36–46)
Hemoglobin: 12.4 g/dL (ref 12.0–16.0)
Platelets: 246 10*3/uL (ref 150–399)
WBC: 4.2 10^3/mL

## 2016-01-24 LAB — HEPATIC FUNCTION PANEL
ALK PHOS: 59 U/L (ref 25–125)
ALT: 13 U/L (ref 7–35)
AST: 17 U/L (ref 13–35)
BILIRUBIN, TOTAL: 0.4 mg/dL

## 2016-01-24 LAB — LIPID PANEL
Cholesterol: 145 mg/dL (ref 0–200)
HDL: 48 mg/dL (ref 35–70)
LDL CALC: 78 mg/dL
TRIGLYCERIDES: 94 mg/dL (ref 40–160)

## 2016-01-24 LAB — HEMOGLOBIN A1C: Hemoglobin A1C: 6.8

## 2016-01-24 LAB — BASIC METABOLIC PANEL
BUN: 12 mg/dL (ref 4–21)
CREATININE: 1.3 mg/dL — AB (ref 0.5–1.1)
Glucose: 74 mg/dL
Potassium: 3.3 mmol/L — AB (ref 3.4–5.3)
SODIUM: 143 mmol/L (ref 137–147)

## 2016-02-03 DIAGNOSIS — F329 Major depressive disorder, single episode, unspecified: Secondary | ICD-10-CM | POA: Diagnosis not present

## 2016-02-03 DIAGNOSIS — G4700 Insomnia, unspecified: Secondary | ICD-10-CM | POA: Diagnosis not present

## 2016-02-27 ENCOUNTER — Encounter: Payer: Self-pay | Admitting: Adult Health

## 2016-02-27 ENCOUNTER — Non-Acute Institutional Stay (SKILLED_NURSING_FACILITY): Payer: Medicare Other | Admitting: Adult Health

## 2016-02-27 DIAGNOSIS — I5032 Chronic diastolic (congestive) heart failure: Secondary | ICD-10-CM

## 2016-02-27 DIAGNOSIS — F329 Major depressive disorder, single episode, unspecified: Secondary | ICD-10-CM

## 2016-02-27 DIAGNOSIS — J438 Other emphysema: Secondary | ICD-10-CM | POA: Diagnosis not present

## 2016-02-27 DIAGNOSIS — Z933 Colostomy status: Secondary | ICD-10-CM | POA: Diagnosis not present

## 2016-02-27 DIAGNOSIS — I110 Hypertensive heart disease with heart failure: Secondary | ICD-10-CM

## 2016-02-27 DIAGNOSIS — F0153 Vascular dementia, unspecified severity, with mood disturbance: Secondary | ICD-10-CM

## 2016-02-27 DIAGNOSIS — E785 Hyperlipidemia, unspecified: Secondary | ICD-10-CM | POA: Diagnosis not present

## 2016-02-27 DIAGNOSIS — F0393 Unspecified dementia, unspecified severity, with mood disturbance: Secondary | ICD-10-CM

## 2016-02-27 DIAGNOSIS — F0151 Vascular dementia with behavioral disturbance: Secondary | ICD-10-CM

## 2016-02-27 DIAGNOSIS — I25119 Atherosclerotic heart disease of native coronary artery with unspecified angina pectoris: Secondary | ICD-10-CM | POA: Diagnosis not present

## 2016-02-27 DIAGNOSIS — E1151 Type 2 diabetes mellitus with diabetic peripheral angiopathy without gangrene: Secondary | ICD-10-CM

## 2016-02-27 DIAGNOSIS — K5909 Other constipation: Secondary | ICD-10-CM

## 2016-02-27 DIAGNOSIS — K219 Gastro-esophageal reflux disease without esophagitis: Secondary | ICD-10-CM

## 2016-02-27 DIAGNOSIS — K5900 Constipation, unspecified: Secondary | ICD-10-CM | POA: Diagnosis not present

## 2016-02-27 DIAGNOSIS — E1169 Type 2 diabetes mellitus with other specified complication: Secondary | ICD-10-CM | POA: Diagnosis not present

## 2016-02-27 DIAGNOSIS — F0280 Dementia in other diseases classified elsewhere without behavioral disturbance: Secondary | ICD-10-CM

## 2016-02-27 NOTE — Progress Notes (Signed)
Patient ID: Sheila Flynn, female   DOB: March 05, 1946, 70 y.o.   MRN: [id number]   Facility: [facility]       Allergies  Allergen Reactions  . Ace Inhibitors Swelling    Chief Complaint  Patient presents with  . Medical Management of Chronic Issues    Follow up    HPI:  She is a long term resident of this facility being seen for the management of her chronic illnesses. Overall her status is without significant change. She tells me that she is feeling good; and has no complaints or concerns. There are nursing concerns at this time.  Past Medical History  Diagnosis Date  . 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 Chronic diastolic CHF (congestive heart failure) ([place])   . CAD (coronary artery disease)     Dr. Matthew Saras  . Angina at rest [facility])   . Hyperlipidemia LDL goal < 100   . Aspiration pneumonia ([place])   . COPD (chronic obstructive pulmonary disease) ([place])   . Acute respiratory failure ([place]) 12/21/10  . Pulmonary hypertension ([place])   . Chronic constipation   . Rectal cancer ([place])     s/p colostomy  . Thyroid goiter   . 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 Vascular dementia with depressed mood   . Pneumonia 12/21/10  . Delirium, induced by drug ([place])     polypharmacy  . Sacral fracture, closed ([place])     s/p kyphoplasty  . Fall     history of falls  . Dementia   . GERD (gastroesophageal reflux disease)   . Major depressive disorder ([place])   . CKD (chronic kidney disease)   . Myocardial infarction ([place])     "I've had about 3" (11/06/2014)  . DM (diabetes mellitus) type II controlled peripheral vascular disorder ([place])   . On home oxygen therapy     "prn at the nursing home" (11/06/2014)  . Migraine     "monthly" (11/06/2014)  . Stroke [facility]) 2011    postop  . Stroke [facility]) ?2014    "left side sometimes weaker since" (11/06/2014)    Past Surgical History  Procedure Laterality Date   . Kyphoplasty      sacrum  . Colostomy      rectal ca  . Appendectomy    . Tonsillectomy    . Abdominal hysterectomy    . Tubal ligation    . Colon surgery    . Right cataract  11-19-2015    VITAL SIGNS BP 167/84 mmHg  Pulse 66  Temp(Src) 98.2 F (36.8 C) (Oral)  Resp 18  Ht 5\' 7"  (1.702 m)  Wt 172 lb (78.019 kg)  BMI 26.93 kg/m2  SpO2 98%  Patient's Medications  New Prescriptions   No medications on file  Previous Medications   ACETAMINOPHEN (TYLENOL) 325 MG TABLET    Take 650 mg by mouth every 6 (six) hours as needed for mild pain.   AMLODIPINE (NORVASC) 10 MG TABLET    Take 10 mg by mouth daily.   ARTIFICIAL TEAR SOLUTION (TEARS PURE) 0.1-0.3 % SOLN    Apply 1 drop to eye every 8 (eight) hours as needed.   ASPIRIN (ASPIRIN EC) 81 MG EC TABLET    Take 81 mg by mouth daily. Swallow whole.   ATORVASTATIN (LIPITOR) 80 MG TABLET    Take 1 tablet (80 mg total) by mouth daily.   BUSPIRONE (BUSPAR) 5 MG  TABLET    Take 5 mg by mouth. Take 1 tablet by mouth every 12 hrs prn.   CALCITRIOL (ROCALTROL) 0.25 MCG CAPSULE    Take 0.25 mcg by mouth daily.   CARVEDILOL (COREG) 6.25 MG TABLET    Take 6.25 mg by mouth 2 (two) times daily with a meal.   CHOLECALCIFEROL 50000 UNITS TABS    Take 50,000 Units by mouth. Take one tablet by mouth one time a day  Starting on the 14 th and ending on the 14 th every month.   DOCUSATE SODIUM (COLACE) 100 MG CAPSULE    Take 100 mg by mouth 3 (three) times daily.   GLIPIZIDE (GLUCOTROL) 5 MG TABLET    Take 0.5 tablets (2.5 mg total) by mouth daily before breakfast.   HYDRALAZINE (APRESOLINE) 25 MG TABLET    Take 25 mg by mouth 2 (two) times daily.    IPRATROPIUM-ALBUTEROL (DUONEB) 0.5-2.5 (3) MG/3ML SOLN    Take 3 mLs by nebulization every 6 (six) hours as needed (shortness of breathe/wheezing).   ISOSORBIDE MONONITRATE (ISMO,MONOKET) 20 MG TABLET    Take 60 mg by mouth daily.    LOSARTAN (COZAAR) 25 MG TABLET    Take 2 tablets (50 mg total) by mouth  daily.   NEPAFENAC (ILEVRO) 0.3 % OPHTHALMIC SUSPENSION    Place 1 drop into the left eye at bedtime.   NITROGLYCERIN (NITROSTAT) 0.4 MG SL TABLET    Place 0.4 mg under the tongue every 5 (five) minutes as needed for chest pain.   OMEPRAZOLE (PRILOSEC) 20 MG CAPSULE    Take 20 mg by mouth daily.    PAROXETINE (PAXIL) 20 MG TABLET    Take by mouth. Take 1.5 tablet by mouth daily.   PREDNISOLONE 5 MG TABS TABLET    Take 5 mg by mouth daily. Reported on 01/23/2016   PROMETHAZINE (PHENERGAN) 25 MG TABLET    Take 25 mg by mouth every 6 (six) hours as needed for nausea or vomiting. Reported on 01/23/2016   PROMETHAZINE (PHENERGAN) 25 MG/ML INJECTION    Inject 25 mg into the vein every 6 (six) hours as needed for nausea or vomiting.   SENNOSIDES-DOCUSATE SODIUM (SENOKOT-S) 8.6-50 MG TABLET    Take 2 tablets by mouth every evening.    TRAMADOL (ULTRAM) 50 MG TABLET    Take one tablet by mouth every 8 hours as needed for pain   TRAZODONE (DESYREL) 50 MG TABLET    Take 50 mg by mouth at bedtime.  Modified Medications   No medications on file  Discontinued Medications   No medications on file     SIGNIFICANT DIAGNOSTIC EXAMS  11-06-14: chest x-ray: Probable left lower lobe retrocardiac opacity, atelectasis versuspneumonia. Grossly stable cardiomegaly allowing for differences in technique.   LABS REVIEWED:   03-11-15: wbc 5.2; hgb 11.9; hct 37.2; mcv 81; plt 287; chol 198; ldl 127; trig 122; hdl 42; hgb a1c 6.7;  vit d 24 04-22-15: glucose 62; bun 18; creat 1.40; k+ 3.5 ;na++ 141 07-22-15: chol 179; ldl 104; trig 112; hdl 53; liver normal albumin 4.0 09-04-15: hgb a1c 7.2; urine micro-albumin 11.3 11-22-14: glucose 61; bun 21; creat 1.41; k+ 3.7; na++144; phos 4.7; pth 67; ca++9.2  01-24-16: wbc 4.2; hgb 12.;4 hct 38.4; mcv 80.3 plt 246; glucose 74; bun 12; creat 1.3; k+ 3.3; na++ 143; liver normal albumin 3.6; chol 145; ldl 78; tri 94; hdl 48; tsh 0.54; hb a1c 6.8;       Review of  Systems Constitutional: Negative for appetite change and fatigue.  HENT: Negative for congestion.   Respiratory: Negative for cough, chest tightness and shortness of breath.   Cardiovascular: Negative for chest pain, palpitations and leg swelling.  Gastrointestinal: Negative for nausea, abdominal pain, diarrhea and constipation.  Musculoskeletal: Negative for myalgias and arthralgias.  Skin: Negative for pallor.  Neurological: Negative for dizziness.  Psychiatric/Behavioral: The patient is not nervous/anxious.       Physical Exam Constitutional: No distress.  Eyes: Conjunctivae are normal.  Neck: Neck supple. No JVD present. No thyromegaly present.  Cardiovascular: Normal rate, regular rhythm and intact distal pulses.   Respiratory: Effort normal and breath sounds normal. No respiratory distress. She has no wheezes.  GI: Soft. Bowel sounds are normal. She exhibits no distension. There is no tenderness.  Has colostomy   Musculoskeletal: She exhibits no edema.  Able to move all extremities   Lymphadenopathy:    She has no cervical adenopathy.  Neurological: She is alert.  Skin: Skin is warm and dry. She is not diaphoretic.  Psychiatric: She has a normal mood and affect.      ASSESSMENT/ PLAN:  1. Hypertension: will continue norvasc 10 mg daily;  hydralazine 25 mg twice daily  coreg  6.25 mg twice daily will increase cozaar to 50 mg twice daily   2. Dyslipidemia: will continue lipitor  80 mg daily  ldl is 78  3. Diabetes: will increase her  glipizide to 2.5 mg  daily hgb a1c is 6.8  4. COPD: is stable will continue duoneb every 6 hours as needed; will continue prednisone 5 mg daily and will monitor her status.   5. CAD: no complaints of chest pain present will continue imdur 60 mg daily; asa 81 mg daily and prn ntg; will monitor  6. Jerrye Bushy: will continue  prilosec 20 mg daily    7. Constipation: will continue colace three times daily; and senna s 2 tabs daily   8.  Depression: will continue paxil 30 mg daily has buspar 5 mg twice daily as needed.   9.  CVA: is neurologically stable; will continue asa 81 mg daily   10. Vascular dementia: on change in her status; is presently not on medications; will not make changes will monitor her status.   11. Diastolic heart failure: will continue  hydralazine 25 mg twice daily imdur 60 mg daily  Will continue coreg  6.25 mg twice daily and will monitor her status.     In 2 weeks will check bmp    Ok Edwards NP [facility]  Contact [phone] Monday through Friday 8am- 5pm  After hours call [phone]

## 2016-03-13 DIAGNOSIS — E089 Diabetes mellitus due to underlying condition without complications: Secondary | ICD-10-CM | POA: Diagnosis not present

## 2016-03-16 DIAGNOSIS — E876 Hypokalemia: Secondary | ICD-10-CM | POA: Diagnosis not present

## 2016-03-16 LAB — BASIC METABOLIC PANEL
BUN: 18 mg/dL (ref 4–21)
CREATININE: 1.4 mg/dL — AB (ref 0.5–1.1)
GLUCOSE: 61 mg/dL
Potassium: 3.5 mmol/L (ref 3.4–5.3)
SODIUM: 142 mmol/L (ref 137–147)

## 2016-03-24 DIAGNOSIS — I6340 Cerebral infarction due to embolism of unspecified cerebral artery: Secondary | ICD-10-CM | POA: Diagnosis not present

## 2016-03-24 DIAGNOSIS — I272 Other secondary pulmonary hypertension: Secondary | ICD-10-CM | POA: Diagnosis not present

## 2016-03-24 DIAGNOSIS — D631 Anemia in chronic kidney disease: Secondary | ICD-10-CM | POA: Diagnosis not present

## 2016-03-24 DIAGNOSIS — M6281 Muscle weakness (generalized): Secondary | ICD-10-CM | POA: Diagnosis not present

## 2016-03-24 DIAGNOSIS — E782 Mixed hyperlipidemia: Secondary | ICD-10-CM | POA: Diagnosis not present

## 2016-03-24 DIAGNOSIS — I129 Hypertensive chronic kidney disease with stage 1 through stage 4 chronic kidney disease, or unspecified chronic kidney disease: Secondary | ICD-10-CM | POA: Diagnosis not present

## 2016-03-24 DIAGNOSIS — E1159 Type 2 diabetes mellitus with other circulatory complications: Secondary | ICD-10-CM | POA: Diagnosis not present

## 2016-03-24 DIAGNOSIS — B351 Tinea unguium: Secondary | ICD-10-CM | POA: Diagnosis not present

## 2016-03-24 DIAGNOSIS — N2581 Secondary hyperparathyroidism of renal origin: Secondary | ICD-10-CM | POA: Diagnosis not present

## 2016-03-24 DIAGNOSIS — E118 Type 2 diabetes mellitus with unspecified complications: Secondary | ICD-10-CM | POA: Diagnosis not present

## 2016-03-24 DIAGNOSIS — F0390 Unspecified dementia without behavioral disturbance: Secondary | ICD-10-CM | POA: Diagnosis not present

## 2016-03-24 DIAGNOSIS — E1129 Type 2 diabetes mellitus with other diabetic kidney complication: Secondary | ICD-10-CM | POA: Diagnosis not present

## 2016-03-24 DIAGNOSIS — N189 Chronic kidney disease, unspecified: Secondary | ICD-10-CM | POA: Diagnosis not present

## 2016-03-24 DIAGNOSIS — N183 Chronic kidney disease, stage 3 (moderate): Secondary | ICD-10-CM | POA: Diagnosis not present

## 2016-03-24 DIAGNOSIS — I2510 Atherosclerotic heart disease of native coronary artery without angina pectoris: Secondary | ICD-10-CM | POA: Diagnosis not present

## 2016-03-24 DIAGNOSIS — N390 Urinary tract infection, site not specified: Secondary | ICD-10-CM | POA: Diagnosis not present

## 2016-03-24 DIAGNOSIS — I5042 Chronic combined systolic (congestive) and diastolic (congestive) heart failure: Secondary | ICD-10-CM | POA: Diagnosis not present

## 2016-03-25 DIAGNOSIS — F329 Major depressive disorder, single episode, unspecified: Secondary | ICD-10-CM | POA: Diagnosis not present

## 2016-03-25 DIAGNOSIS — F0280 Dementia in other diseases classified elsewhere without behavioral disturbance: Secondary | ICD-10-CM | POA: Diagnosis not present

## 2016-03-25 DIAGNOSIS — I10 Essential (primary) hypertension: Secondary | ICD-10-CM | POA: Diagnosis not present

## 2016-03-25 DIAGNOSIS — E119 Type 2 diabetes mellitus without complications: Secondary | ICD-10-CM | POA: Diagnosis not present

## 2016-03-25 DIAGNOSIS — E785 Hyperlipidemia, unspecified: Secondary | ICD-10-CM | POA: Diagnosis not present

## 2016-03-25 DIAGNOSIS — K219 Gastro-esophageal reflux disease without esophagitis: Secondary | ICD-10-CM | POA: Diagnosis not present

## 2016-03-25 DIAGNOSIS — M6281 Muscle weakness (generalized): Secondary | ICD-10-CM | POA: Diagnosis not present

## 2016-03-25 DIAGNOSIS — I5032 Chronic diastolic (congestive) heart failure: Secondary | ICD-10-CM | POA: Diagnosis not present

## 2016-03-25 DIAGNOSIS — J449 Chronic obstructive pulmonary disease, unspecified: Secondary | ICD-10-CM | POA: Diagnosis not present

## 2016-03-25 DIAGNOSIS — R2689 Other abnormalities of gait and mobility: Secondary | ICD-10-CM | POA: Diagnosis not present

## 2016-03-25 DIAGNOSIS — N390 Urinary tract infection, site not specified: Secondary | ICD-10-CM | POA: Diagnosis not present

## 2016-03-25 DIAGNOSIS — I6789 Other cerebrovascular disease: Secondary | ICD-10-CM | POA: Diagnosis not present

## 2016-03-25 DIAGNOSIS — N183 Chronic kidney disease, stage 3 (moderate): Secondary | ICD-10-CM | POA: Diagnosis not present

## 2016-03-26 DIAGNOSIS — M6281 Muscle weakness (generalized): Secondary | ICD-10-CM | POA: Diagnosis not present

## 2016-03-26 DIAGNOSIS — I10 Essential (primary) hypertension: Secondary | ICD-10-CM | POA: Diagnosis not present

## 2016-03-26 DIAGNOSIS — F0280 Dementia in other diseases classified elsewhere without behavioral disturbance: Secondary | ICD-10-CM | POA: Diagnosis not present

## 2016-03-26 DIAGNOSIS — N183 Chronic kidney disease, stage 3 (moderate): Secondary | ICD-10-CM | POA: Diagnosis not present

## 2016-03-26 DIAGNOSIS — R2689 Other abnormalities of gait and mobility: Secondary | ICD-10-CM | POA: Diagnosis not present

## 2016-03-26 DIAGNOSIS — I5032 Chronic diastolic (congestive) heart failure: Secondary | ICD-10-CM | POA: Diagnosis not present

## 2016-03-27 DIAGNOSIS — I10 Essential (primary) hypertension: Secondary | ICD-10-CM | POA: Diagnosis not present

## 2016-03-27 DIAGNOSIS — N183 Chronic kidney disease, stage 3 (moderate): Secondary | ICD-10-CM | POA: Diagnosis not present

## 2016-03-27 DIAGNOSIS — I5032 Chronic diastolic (congestive) heart failure: Secondary | ICD-10-CM | POA: Diagnosis not present

## 2016-03-27 DIAGNOSIS — F0280 Dementia in other diseases classified elsewhere without behavioral disturbance: Secondary | ICD-10-CM | POA: Diagnosis not present

## 2016-03-27 DIAGNOSIS — M6281 Muscle weakness (generalized): Secondary | ICD-10-CM | POA: Diagnosis not present

## 2016-03-27 DIAGNOSIS — R2689 Other abnormalities of gait and mobility: Secondary | ICD-10-CM | POA: Diagnosis not present

## 2016-03-30 ENCOUNTER — Encounter: Payer: Self-pay | Admitting: Adult Health

## 2016-03-30 ENCOUNTER — Non-Acute Institutional Stay (SKILLED_NURSING_FACILITY): Payer: Medicare Other | Admitting: Adult Health

## 2016-03-30 DIAGNOSIS — I110 Hypertensive heart disease with heart failure: Secondary | ICD-10-CM | POA: Diagnosis not present

## 2016-03-30 DIAGNOSIS — E785 Hyperlipidemia, unspecified: Secondary | ICD-10-CM | POA: Diagnosis not present

## 2016-03-30 DIAGNOSIS — E1169 Type 2 diabetes mellitus with other specified complication: Secondary | ICD-10-CM

## 2016-03-30 DIAGNOSIS — Z933 Colostomy status: Secondary | ICD-10-CM | POA: Diagnosis not present

## 2016-03-30 DIAGNOSIS — F329 Major depressive disorder, single episode, unspecified: Secondary | ICD-10-CM | POA: Diagnosis not present

## 2016-03-30 DIAGNOSIS — J438 Other emphysema: Secondary | ICD-10-CM

## 2016-03-30 DIAGNOSIS — I5032 Chronic diastolic (congestive) heart failure: Secondary | ICD-10-CM

## 2016-03-30 DIAGNOSIS — E1151 Type 2 diabetes mellitus with diabetic peripheral angiopathy without gangrene: Secondary | ICD-10-CM

## 2016-03-30 DIAGNOSIS — F0151 Vascular dementia with behavioral disturbance: Secondary | ICD-10-CM

## 2016-03-30 DIAGNOSIS — I25119 Atherosclerotic heart disease of native coronary artery with unspecified angina pectoris: Secondary | ICD-10-CM | POA: Diagnosis not present

## 2016-03-30 DIAGNOSIS — F0153 Vascular dementia, unspecified severity, with mood disturbance: Secondary | ICD-10-CM

## 2016-03-30 NOTE — Progress Notes (Signed)
Patient ID: Sheila Flynn, female   DOB: 1946-04-18, 70 y.o.   MRN: [id number]   Location:  [place] Room Number: 128-A Place of Service:  SNF (31)   CODE STATUS: Full Code  Allergies  Allergen Reactions  . Ace Inhibitors Swelling    Chief Complaint  Patient presents with  . Medical Management of Chronic Issues    Follow up    HPI:  She is a long term resident of this facility being seen for the management of her chronic illnesses. Overall her status is stable. She tells me that she is feeling good. She denies any concerns or complaints. There are no nursing concerns at this time.   Past Medical History  Diagnosis Date  . 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 Chronic diastolic CHF (congestive heart failure) ([place])   . CAD (coronary artery disease)     Dr. Matthew Saras  . Angina at rest [facility])   . Hyperlipidemia LDL goal < 100   . Aspiration pneumonia ([place])   . COPD (chronic obstructive pulmonary disease) ([place])   . Acute respiratory failure ([place]) 12/21/10  . Pulmonary hypertension ([place])   . Chronic constipation   . Rectal cancer ([place])     s/p colostomy  . Thyroid goiter   . 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 Vascular dementia with depressed mood   . Pneumonia 12/21/10  . Delirium, induced by drug ([place])     polypharmacy  . Sacral fracture, closed ([place])     s/p kyphoplasty  . Fall     history of falls  . Dementia   . GERD (gastroesophageal reflux disease)   . Major depressive disorder ([place])   . CKD (chronic kidney disease)   . Myocardial infarction ([place])     "I've had about 3" (11/06/2014)  . DM (diabetes mellitus) type II controlled peripheral vascular disorder ([place])   . On home oxygen therapy     "prn at the nursing home" (11/06/2014)  . Migraine     "monthly" (11/06/2014)  . Stroke [facility]) 2011    postop  . Stroke [facility]) ?2014    "left side  sometimes weaker since" (11/06/2014)    Past Surgical History  Procedure Laterality Date  . Kyphoplasty      sacrum  . Colostomy      rectal ca  . Appendectomy    . Tonsillectomy    . Abdominal hysterectomy    . Tubal ligation    . Colon surgery    . Right cataract  11-19-2015    Social History   Social History  . Marital Status: Widowed    Spouse Name: N/A  . Number of Children: N/A  . Years of Education: N/A   Occupational History  .      custodial   Social History Main Topics  . Smoking status: Former Smoker -- 0.50 packs/day for 2 years    Types: Cigarettes  . Smokeless tobacco: Never Used     Comment: "quit smoking cigarettes in 1969"  . Alcohol Use: No  . Drug Use: Yes     Comment: previously smoked crack, has had positive UDS for amphetamines and narcotics  . Sexual Activity: No   Other Topics Concern  . Not on file   Social History Narrative   Born in [place], has some high school education, retired from custodian job, divorced, has children  History reviewed. No pertinent family history.    VITAL SIGNS BP 144/82 mmHg  Pulse 80  Temp(Src) 98.2 F (36.8 C) (Oral)  Resp 18  Ht 5\' 7"  (1.702 m)  Wt 174 lb 6 oz (79.096 kg)  BMI 27.30 kg/m2  SpO2 96%  Patient's Medications  New Prescriptions   No medications on file  Previous Medications   ACETAMINOPHEN (TYLENOL) 325 MG TABLET    Take 650 mg by mouth every 6 (six) hours as needed for mild pain.   AMLODIPINE (NORVASC) 10 MG TABLET    Take 10 mg by mouth daily.   ARTIFICIAL TEAR SOLUTION (TEARS PURE) 0.1-0.3 % SOLN    Apply 1 drop to eye every 8 (eight) hours as needed.   ASPIRIN (ASPIRIN EC) 81 MG EC TABLET    Take 81 mg by mouth daily. Swallow whole.   ATORVASTATIN (LIPITOR) 80 MG TABLET    Take 1 tablet (80 mg total) by mouth daily.   BUSPIRONE (BUSPAR) 5 MG TABLET    Take 5 mg by mouth. Take 1 tablet by mouth every 12 hrs prn.   CALCITRIOL (ROCALTROL) 0.25 MCG CAPSULE    Take 0.25 mcg by  mouth daily.   CARVEDILOL (COREG) 6.25 MG TABLET    Take 6.25 mg by mouth 2 (two) times daily with a meal.   CHOLECALCIFEROL 50000 UNITS TABS    Take 50,000 Units by mouth. Take one tablet by mouth one time a day  Starting on the 14 th and ending on the 14 th every month.   CIPROFLOXACIN (CIPRO) 500 MG TABLET    Take 500 mg by mouth 2 (two) times daily.   DOCUSATE SODIUM (COLACE) 100 MG CAPSULE    Take 100 mg by mouth 3 (three) times daily.   GLIPIZIDE (GLUCOTROL) 5 MG TABLET    Take 0.5 tablets (2.5 mg total) by mouth daily before breakfast.   HYDRALAZINE (APRESOLINE) 25 MG TABLET    Take 25 mg by mouth 2 (two) times daily.    IPRATROPIUM-ALBUTEROL (DUONEB) 0.5-2.5 (3) MG/3ML SOLN    Take 3 mLs by nebulization every 6 (six) hours as needed (shortness of breathe/wheezing).   ISOSORBIDE MONONITRATE (ISMO,MONOKET) 20 MG TABLET    Take 60 mg by mouth daily.    LOSARTAN (COZAAR) 25 MG TABLET    Take 2 tablets (50 mg total) by mouth daily.   NEPAFENAC (ILEVRO) 0.3 % OPHTHALMIC SUSPENSION    Place 1 drop into the left eye at bedtime.   NITROGLYCERIN (NITROSTAT) 0.4 MG SL TABLET    Place 0.4 mg under the tongue every 5 (five) minutes as needed for chest pain.   OMEPRAZOLE (PRILOSEC) 20 MG CAPSULE    Take 20 mg by mouth daily.    PAROXETINE (PAXIL) 20 MG TABLET    Take by mouth. Take 1.5 tablet by mouth daily.   POTASSIUM CHLORIDE SA (K-DUR,KLOR-CON) 20 MEQ TABLET    Take 20 mEq by mouth daily.   PREDNISOLONE 5 MG TABS TABLET    Take 5 mg by mouth daily. Reported on 01/23/2016   PROMETHAZINE (PHENERGAN) 25 MG TABLET    Take 25 mg by mouth every 6 (six) hours as needed for nausea or vomiting. Reported on 01/23/2016   PROMETHAZINE (PHENERGAN) 25 MG/ML INJECTION    Inject 25 mg into the vein every 6 (six) hours as needed for nausea or vomiting.   SENNOSIDES-DOCUSATE SODIUM (SENOKOT-S) 8.6-50 MG TABLET    Take 2 tablets by mouth every evening.  TRAMADOL (ULTRAM) 50 MG TABLET    Take one tablet by mouth every  8 hours as needed for pain   TRAZODONE (DESYREL) 50 MG TABLET    Take 50 mg by mouth at bedtime.  Modified Medications   No medications on file  Discontinued Medications   No medications on file     SIGNIFICANT DIAGNOSTIC EXAMS   11-06-14: chest x-ray: Probable left lower lobe retrocardiac opacity, atelectasis versuspneumonia. Grossly stable cardiomegaly allowing for differences in technique.   LABS REVIEWED:   04-22-15: glucose 62; bun 18; creat 1.40; k+ 3.5 ;na++ 141 07-22-15: chol 179; ldl 104; trig 112; hdl 53; liver normal albumin 4.0 09-04-15: hgb a1c 7.2; urine micro-albumin 11.3 11-22-14: glucose 61; bun 21; creat 1.41; k+ 3.7; na++144; phos 4.7; pth 67; ca++9.2  01-24-16: wbc 4.2; hgb 12.;4 hct 38.4; mcv 80.3 plt 246; glucose 74; bun 12; creat 1.3; k+ 3.3; na++ 143; liver normal albumin 3.6; chol 145; ldl 78; tri 94; hdl 48; tsh 0.54; hb a1c 6.8;  03-13-16: glucose 101; bun 13; creat 1.35; k+ 2.8; na++141 03-16-16: glucose 61; bun 18; creat 1.35; k+ 3.5; na++ 142 03-28-16: urine culture: e-coli: cipro       Review of Systems Constitutional: Negative for appetite change and fatigue.  HENT: Negative for congestion.   Respiratory: Negative for cough, chest tightness and shortness of breath.   Cardiovascular: Negative for chest pain, palpitations and leg swelling.  Gastrointestinal: Negative for nausea, abdominal pain, diarrhea and constipation.  Musculoskeletal: Negative for myalgias and arthralgias.  Skin: Negative for pallor.  Neurological: Negative for dizziness.  Psychiatric/Behavioral: The patient is not nervous/anxious.       Physical Exam Constitutional: No distress.  Eyes: Conjunctivae are normal.  Neck: Neck supple. No JVD present. No thyromegaly present.  Cardiovascular: Normal rate, regular rhythm and intact distal pulses.   Respiratory: Effort normal and breath sounds normal. No respiratory distress. She has no wheezes.  GI: Soft. Bowel sounds are normal. She  exhibits no distension. There is no tenderness.  Has colostomy   Musculoskeletal: She exhibits no edema.  Able to move all extremities   Lymphadenopathy:    She has no cervical adenopathy.  Neurological: She is alert.  Skin: Skin is warm and dry. She is not diaphoretic.  Psychiatric: She has a normal mood and affect.      ASSESSMENT/ PLAN:  1. Hypertension: will continue norvasc 10 mg daily;  hydralazine 25 mg twice daily  coreg  6.25 mg twice daily will increase cozaar to 50 mg twice daily   2. Dyslipidemia: will continue lipitor  80 mg daily  ldl is 78  3. Diabetes: will continue  glipizide to 2.5 mg  daily hgb a1c is 6.8  4. COPD: is stable will continue duoneb every 6 hours as needed; will continue prednisone 5 mg daily and will monitor her status.   5. CAD: no complaints of chest pain present will continue imdur 60 mg daily; asa 81 mg daily and prn ntg; will monitor  6. Jerrye Bushy: will continue  prilosec 20 mg daily    7. Constipation: will continue colace three times daily; and senna s 2 tabs daily   8. Depression: will continue paxil 30 mg daily has buspar 5 mg twice daily as needed. Take trazodone 50 mg nightly   9.  CVA: is neurologically stable; will continue asa 81 mg daily   10. Vascular dementia: on change in her status; is presently not on medications; will not make changes  will monitor her status.   11. Diastolic heart failure: will continue  hydralazine 25 mg twice daily imdur 60 mg daily  Will continue coreg  6.25 mg twice daily and will monitor her status.         Ok Edwards NP [facility]  Contact [phone] Monday through Friday 8am- 5pm  After hours call [phone]

## 2016-03-31 DIAGNOSIS — F0280 Dementia in other diseases classified elsewhere without behavioral disturbance: Secondary | ICD-10-CM | POA: Diagnosis not present

## 2016-03-31 DIAGNOSIS — R2689 Other abnormalities of gait and mobility: Secondary | ICD-10-CM | POA: Diagnosis not present

## 2016-03-31 DIAGNOSIS — M6281 Muscle weakness (generalized): Secondary | ICD-10-CM | POA: Diagnosis not present

## 2016-03-31 DIAGNOSIS — I10 Essential (primary) hypertension: Secondary | ICD-10-CM | POA: Diagnosis not present

## 2016-03-31 DIAGNOSIS — I5032 Chronic diastolic (congestive) heart failure: Secondary | ICD-10-CM | POA: Diagnosis not present

## 2016-03-31 DIAGNOSIS — N183 Chronic kidney disease, stage 3 (moderate): Secondary | ICD-10-CM | POA: Diagnosis not present

## 2016-04-01 DIAGNOSIS — M6281 Muscle weakness (generalized): Secondary | ICD-10-CM | POA: Diagnosis not present

## 2016-04-01 DIAGNOSIS — I5032 Chronic diastolic (congestive) heart failure: Secondary | ICD-10-CM | POA: Diagnosis not present

## 2016-04-01 DIAGNOSIS — R2689 Other abnormalities of gait and mobility: Secondary | ICD-10-CM | POA: Diagnosis not present

## 2016-04-01 DIAGNOSIS — I10 Essential (primary) hypertension: Secondary | ICD-10-CM | POA: Diagnosis not present

## 2016-04-01 DIAGNOSIS — N183 Chronic kidney disease, stage 3 (moderate): Secondary | ICD-10-CM | POA: Diagnosis not present

## 2016-04-01 DIAGNOSIS — F0280 Dementia in other diseases classified elsewhere without behavioral disturbance: Secondary | ICD-10-CM | POA: Diagnosis not present

## 2016-04-02 DIAGNOSIS — R2689 Other abnormalities of gait and mobility: Secondary | ICD-10-CM | POA: Diagnosis not present

## 2016-04-02 DIAGNOSIS — F0280 Dementia in other diseases classified elsewhere without behavioral disturbance: Secondary | ICD-10-CM | POA: Diagnosis not present

## 2016-04-02 DIAGNOSIS — N183 Chronic kidney disease, stage 3 (moderate): Secondary | ICD-10-CM | POA: Diagnosis not present

## 2016-04-02 DIAGNOSIS — I10 Essential (primary) hypertension: Secondary | ICD-10-CM | POA: Diagnosis not present

## 2016-04-02 DIAGNOSIS — M6281 Muscle weakness (generalized): Secondary | ICD-10-CM | POA: Diagnosis not present

## 2016-04-02 DIAGNOSIS — I5032 Chronic diastolic (congestive) heart failure: Secondary | ICD-10-CM | POA: Diagnosis not present

## 2016-04-03 DIAGNOSIS — F0280 Dementia in other diseases classified elsewhere without behavioral disturbance: Secondary | ICD-10-CM | POA: Diagnosis not present

## 2016-04-03 DIAGNOSIS — M6281 Muscle weakness (generalized): Secondary | ICD-10-CM | POA: Diagnosis not present

## 2016-04-03 DIAGNOSIS — N183 Chronic kidney disease, stage 3 (moderate): Secondary | ICD-10-CM | POA: Diagnosis not present

## 2016-04-03 DIAGNOSIS — R2689 Other abnormalities of gait and mobility: Secondary | ICD-10-CM | POA: Diagnosis not present

## 2016-04-03 DIAGNOSIS — I5032 Chronic diastolic (congestive) heart failure: Secondary | ICD-10-CM | POA: Diagnosis not present

## 2016-04-03 DIAGNOSIS — I10 Essential (primary) hypertension: Secondary | ICD-10-CM | POA: Diagnosis not present

## 2016-04-04 DIAGNOSIS — M6281 Muscle weakness (generalized): Secondary | ICD-10-CM | POA: Diagnosis not present

## 2016-04-04 DIAGNOSIS — I10 Essential (primary) hypertension: Secondary | ICD-10-CM | POA: Diagnosis not present

## 2016-04-04 DIAGNOSIS — F0280 Dementia in other diseases classified elsewhere without behavioral disturbance: Secondary | ICD-10-CM | POA: Diagnosis not present

## 2016-04-04 DIAGNOSIS — N183 Chronic kidney disease, stage 3 (moderate): Secondary | ICD-10-CM | POA: Diagnosis not present

## 2016-04-04 DIAGNOSIS — I5032 Chronic diastolic (congestive) heart failure: Secondary | ICD-10-CM | POA: Diagnosis not present

## 2016-04-04 DIAGNOSIS — R2689 Other abnormalities of gait and mobility: Secondary | ICD-10-CM | POA: Diagnosis not present

## 2016-04-06 DIAGNOSIS — I5032 Chronic diastolic (congestive) heart failure: Secondary | ICD-10-CM | POA: Diagnosis not present

## 2016-04-06 DIAGNOSIS — N183 Chronic kidney disease, stage 3 (moderate): Secondary | ICD-10-CM | POA: Diagnosis not present

## 2016-04-06 DIAGNOSIS — I10 Essential (primary) hypertension: Secondary | ICD-10-CM | POA: Diagnosis not present

## 2016-04-06 DIAGNOSIS — R2689 Other abnormalities of gait and mobility: Secondary | ICD-10-CM | POA: Diagnosis not present

## 2016-04-06 DIAGNOSIS — F0280 Dementia in other diseases classified elsewhere without behavioral disturbance: Secondary | ICD-10-CM | POA: Diagnosis not present

## 2016-04-06 DIAGNOSIS — M6281 Muscle weakness (generalized): Secondary | ICD-10-CM | POA: Diagnosis not present

## 2016-04-07 DIAGNOSIS — R2689 Other abnormalities of gait and mobility: Secondary | ICD-10-CM | POA: Diagnosis not present

## 2016-04-07 DIAGNOSIS — I10 Essential (primary) hypertension: Secondary | ICD-10-CM | POA: Diagnosis not present

## 2016-04-07 DIAGNOSIS — M6281 Muscle weakness (generalized): Secondary | ICD-10-CM | POA: Diagnosis not present

## 2016-04-07 DIAGNOSIS — N183 Chronic kidney disease, stage 3 (moderate): Secondary | ICD-10-CM | POA: Diagnosis not present

## 2016-04-07 DIAGNOSIS — F0280 Dementia in other diseases classified elsewhere without behavioral disturbance: Secondary | ICD-10-CM | POA: Diagnosis not present

## 2016-04-07 DIAGNOSIS — I5032 Chronic diastolic (congestive) heart failure: Secondary | ICD-10-CM | POA: Diagnosis not present

## 2016-04-08 DIAGNOSIS — N183 Chronic kidney disease, stage 3 (moderate): Secondary | ICD-10-CM | POA: Diagnosis not present

## 2016-04-08 DIAGNOSIS — M6281 Muscle weakness (generalized): Secondary | ICD-10-CM | POA: Diagnosis not present

## 2016-04-08 DIAGNOSIS — I5032 Chronic diastolic (congestive) heart failure: Secondary | ICD-10-CM | POA: Diagnosis not present

## 2016-04-08 DIAGNOSIS — I10 Essential (primary) hypertension: Secondary | ICD-10-CM | POA: Diagnosis not present

## 2016-04-08 DIAGNOSIS — F0280 Dementia in other diseases classified elsewhere without behavioral disturbance: Secondary | ICD-10-CM | POA: Diagnosis not present

## 2016-04-08 DIAGNOSIS — R2689 Other abnormalities of gait and mobility: Secondary | ICD-10-CM | POA: Diagnosis not present

## 2016-04-09 DIAGNOSIS — N183 Chronic kidney disease, stage 3 (moderate): Secondary | ICD-10-CM | POA: Diagnosis not present

## 2016-04-09 DIAGNOSIS — F0280 Dementia in other diseases classified elsewhere without behavioral disturbance: Secondary | ICD-10-CM | POA: Diagnosis not present

## 2016-04-09 DIAGNOSIS — E119 Type 2 diabetes mellitus without complications: Secondary | ICD-10-CM | POA: Diagnosis not present

## 2016-04-09 DIAGNOSIS — K219 Gastro-esophageal reflux disease without esophagitis: Secondary | ICD-10-CM | POA: Diagnosis not present

## 2016-04-09 DIAGNOSIS — F329 Major depressive disorder, single episode, unspecified: Secondary | ICD-10-CM | POA: Diagnosis not present

## 2016-04-09 DIAGNOSIS — J449 Chronic obstructive pulmonary disease, unspecified: Secondary | ICD-10-CM | POA: Diagnosis not present

## 2016-04-09 DIAGNOSIS — M6281 Muscle weakness (generalized): Secondary | ICD-10-CM | POA: Diagnosis not present

## 2016-04-09 DIAGNOSIS — E785 Hyperlipidemia, unspecified: Secondary | ICD-10-CM | POA: Diagnosis not present

## 2016-04-09 DIAGNOSIS — I6789 Other cerebrovascular disease: Secondary | ICD-10-CM | POA: Diagnosis not present

## 2016-04-09 DIAGNOSIS — I10 Essential (primary) hypertension: Secondary | ICD-10-CM | POA: Diagnosis not present

## 2016-04-09 DIAGNOSIS — I5032 Chronic diastolic (congestive) heart failure: Secondary | ICD-10-CM | POA: Diagnosis not present

## 2016-04-09 DIAGNOSIS — R2689 Other abnormalities of gait and mobility: Secondary | ICD-10-CM | POA: Diagnosis not present

## 2016-04-09 DIAGNOSIS — N390 Urinary tract infection, site not specified: Secondary | ICD-10-CM | POA: Diagnosis not present

## 2016-04-10 DIAGNOSIS — N183 Chronic kidney disease, stage 3 (moderate): Secondary | ICD-10-CM | POA: Diagnosis not present

## 2016-04-10 DIAGNOSIS — R2689 Other abnormalities of gait and mobility: Secondary | ICD-10-CM | POA: Diagnosis not present

## 2016-04-10 DIAGNOSIS — F0280 Dementia in other diseases classified elsewhere without behavioral disturbance: Secondary | ICD-10-CM | POA: Diagnosis not present

## 2016-04-10 DIAGNOSIS — M6281 Muscle weakness (generalized): Secondary | ICD-10-CM | POA: Diagnosis not present

## 2016-04-10 DIAGNOSIS — I5032 Chronic diastolic (congestive) heart failure: Secondary | ICD-10-CM | POA: Diagnosis not present

## 2016-04-10 DIAGNOSIS — I10 Essential (primary) hypertension: Secondary | ICD-10-CM | POA: Diagnosis not present

## 2016-04-13 DIAGNOSIS — I10 Essential (primary) hypertension: Secondary | ICD-10-CM | POA: Diagnosis not present

## 2016-04-13 DIAGNOSIS — N183 Chronic kidney disease, stage 3 (moderate): Secondary | ICD-10-CM | POA: Diagnosis not present

## 2016-04-13 DIAGNOSIS — M6281 Muscle weakness (generalized): Secondary | ICD-10-CM | POA: Diagnosis not present

## 2016-04-13 DIAGNOSIS — F0280 Dementia in other diseases classified elsewhere without behavioral disturbance: Secondary | ICD-10-CM | POA: Diagnosis not present

## 2016-04-13 DIAGNOSIS — R2689 Other abnormalities of gait and mobility: Secondary | ICD-10-CM | POA: Diagnosis not present

## 2016-04-13 DIAGNOSIS — I5032 Chronic diastolic (congestive) heart failure: Secondary | ICD-10-CM | POA: Diagnosis not present

## 2016-04-14 DIAGNOSIS — I5032 Chronic diastolic (congestive) heart failure: Secondary | ICD-10-CM | POA: Diagnosis not present

## 2016-04-14 DIAGNOSIS — N183 Chronic kidney disease, stage 3 (moderate): Secondary | ICD-10-CM | POA: Diagnosis not present

## 2016-04-14 DIAGNOSIS — M6281 Muscle weakness (generalized): Secondary | ICD-10-CM | POA: Diagnosis not present

## 2016-04-14 DIAGNOSIS — F0280 Dementia in other diseases classified elsewhere without behavioral disturbance: Secondary | ICD-10-CM | POA: Diagnosis not present

## 2016-04-14 DIAGNOSIS — I10 Essential (primary) hypertension: Secondary | ICD-10-CM | POA: Diagnosis not present

## 2016-04-14 DIAGNOSIS — R2689 Other abnormalities of gait and mobility: Secondary | ICD-10-CM | POA: Diagnosis not present

## 2016-04-15 DIAGNOSIS — I5032 Chronic diastolic (congestive) heart failure: Secondary | ICD-10-CM | POA: Diagnosis not present

## 2016-04-15 DIAGNOSIS — R2689 Other abnormalities of gait and mobility: Secondary | ICD-10-CM | POA: Diagnosis not present

## 2016-04-15 DIAGNOSIS — I10 Essential (primary) hypertension: Secondary | ICD-10-CM | POA: Diagnosis not present

## 2016-04-15 DIAGNOSIS — F0280 Dementia in other diseases classified elsewhere without behavioral disturbance: Secondary | ICD-10-CM | POA: Diagnosis not present

## 2016-04-15 DIAGNOSIS — M6281 Muscle weakness (generalized): Secondary | ICD-10-CM | POA: Diagnosis not present

## 2016-04-15 DIAGNOSIS — N183 Chronic kidney disease, stage 3 (moderate): Secondary | ICD-10-CM | POA: Diagnosis not present

## 2016-04-16 DIAGNOSIS — N183 Chronic kidney disease, stage 3 (moderate): Secondary | ICD-10-CM | POA: Diagnosis not present

## 2016-04-16 DIAGNOSIS — I10 Essential (primary) hypertension: Secondary | ICD-10-CM | POA: Diagnosis not present

## 2016-04-16 DIAGNOSIS — M6281 Muscle weakness (generalized): Secondary | ICD-10-CM | POA: Diagnosis not present

## 2016-04-16 DIAGNOSIS — F0280 Dementia in other diseases classified elsewhere without behavioral disturbance: Secondary | ICD-10-CM | POA: Diagnosis not present

## 2016-04-16 DIAGNOSIS — I5032 Chronic diastolic (congestive) heart failure: Secondary | ICD-10-CM | POA: Diagnosis not present

## 2016-04-16 DIAGNOSIS — R2689 Other abnormalities of gait and mobility: Secondary | ICD-10-CM | POA: Diagnosis not present

## 2016-04-17 DIAGNOSIS — N189 Chronic kidney disease, unspecified: Secondary | ICD-10-CM | POA: Diagnosis not present

## 2016-04-17 DIAGNOSIS — I5032 Chronic diastolic (congestive) heart failure: Secondary | ICD-10-CM | POA: Diagnosis not present

## 2016-04-17 DIAGNOSIS — N183 Chronic kidney disease, stage 3 (moderate): Secondary | ICD-10-CM | POA: Diagnosis not present

## 2016-04-17 DIAGNOSIS — F0280 Dementia in other diseases classified elsewhere without behavioral disturbance: Secondary | ICD-10-CM | POA: Diagnosis not present

## 2016-04-17 DIAGNOSIS — I10 Essential (primary) hypertension: Secondary | ICD-10-CM | POA: Diagnosis not present

## 2016-04-17 DIAGNOSIS — R2689 Other abnormalities of gait and mobility: Secondary | ICD-10-CM | POA: Diagnosis not present

## 2016-04-17 DIAGNOSIS — E119 Type 2 diabetes mellitus without complications: Secondary | ICD-10-CM | POA: Diagnosis not present

## 2016-04-17 DIAGNOSIS — M6281 Muscle weakness (generalized): Secondary | ICD-10-CM | POA: Diagnosis not present

## 2016-04-17 LAB — HEMOGLOBIN A1C: Hemoglobin A1C: 6.3

## 2016-04-20 DIAGNOSIS — R2689 Other abnormalities of gait and mobility: Secondary | ICD-10-CM | POA: Diagnosis not present

## 2016-04-20 DIAGNOSIS — N183 Chronic kidney disease, stage 3 (moderate): Secondary | ICD-10-CM | POA: Diagnosis not present

## 2016-04-20 DIAGNOSIS — F0280 Dementia in other diseases classified elsewhere without behavioral disturbance: Secondary | ICD-10-CM | POA: Diagnosis not present

## 2016-04-20 DIAGNOSIS — I5032 Chronic diastolic (congestive) heart failure: Secondary | ICD-10-CM | POA: Diagnosis not present

## 2016-04-20 DIAGNOSIS — M6281 Muscle weakness (generalized): Secondary | ICD-10-CM | POA: Diagnosis not present

## 2016-04-20 DIAGNOSIS — I10 Essential (primary) hypertension: Secondary | ICD-10-CM | POA: Diagnosis not present

## 2016-04-21 DIAGNOSIS — R2689 Other abnormalities of gait and mobility: Secondary | ICD-10-CM | POA: Diagnosis not present

## 2016-04-21 DIAGNOSIS — I5032 Chronic diastolic (congestive) heart failure: Secondary | ICD-10-CM | POA: Diagnosis not present

## 2016-04-21 DIAGNOSIS — F0280 Dementia in other diseases classified elsewhere without behavioral disturbance: Secondary | ICD-10-CM | POA: Diagnosis not present

## 2016-04-21 DIAGNOSIS — N183 Chronic kidney disease, stage 3 (moderate): Secondary | ICD-10-CM | POA: Diagnosis not present

## 2016-04-21 DIAGNOSIS — M6281 Muscle weakness (generalized): Secondary | ICD-10-CM | POA: Diagnosis not present

## 2016-04-21 DIAGNOSIS — I10 Essential (primary) hypertension: Secondary | ICD-10-CM | POA: Diagnosis not present

## 2016-04-22 DIAGNOSIS — R2689 Other abnormalities of gait and mobility: Secondary | ICD-10-CM | POA: Diagnosis not present

## 2016-04-22 DIAGNOSIS — I10 Essential (primary) hypertension: Secondary | ICD-10-CM | POA: Diagnosis not present

## 2016-04-22 DIAGNOSIS — F0280 Dementia in other diseases classified elsewhere without behavioral disturbance: Secondary | ICD-10-CM | POA: Diagnosis not present

## 2016-04-22 DIAGNOSIS — I5032 Chronic diastolic (congestive) heart failure: Secondary | ICD-10-CM | POA: Diagnosis not present

## 2016-04-22 DIAGNOSIS — M6281 Muscle weakness (generalized): Secondary | ICD-10-CM | POA: Diagnosis not present

## 2016-04-22 DIAGNOSIS — N183 Chronic kidney disease, stage 3 (moderate): Secondary | ICD-10-CM | POA: Diagnosis not present

## 2016-04-23 DIAGNOSIS — R2689 Other abnormalities of gait and mobility: Secondary | ICD-10-CM | POA: Diagnosis not present

## 2016-04-23 DIAGNOSIS — I5032 Chronic diastolic (congestive) heart failure: Secondary | ICD-10-CM | POA: Diagnosis not present

## 2016-04-23 DIAGNOSIS — F0280 Dementia in other diseases classified elsewhere without behavioral disturbance: Secondary | ICD-10-CM | POA: Diagnosis not present

## 2016-04-23 DIAGNOSIS — N183 Chronic kidney disease, stage 3 (moderate): Secondary | ICD-10-CM | POA: Diagnosis not present

## 2016-04-23 DIAGNOSIS — I10 Essential (primary) hypertension: Secondary | ICD-10-CM | POA: Diagnosis not present

## 2016-04-23 DIAGNOSIS — M6281 Muscle weakness (generalized): Secondary | ICD-10-CM | POA: Diagnosis not present

## 2016-04-24 DIAGNOSIS — I5032 Chronic diastolic (congestive) heart failure: Secondary | ICD-10-CM | POA: Diagnosis not present

## 2016-04-24 DIAGNOSIS — R2689 Other abnormalities of gait and mobility: Secondary | ICD-10-CM | POA: Diagnosis not present

## 2016-04-24 DIAGNOSIS — F0280 Dementia in other diseases classified elsewhere without behavioral disturbance: Secondary | ICD-10-CM | POA: Diagnosis not present

## 2016-04-24 DIAGNOSIS — N183 Chronic kidney disease, stage 3 (moderate): Secondary | ICD-10-CM | POA: Diagnosis not present

## 2016-04-24 DIAGNOSIS — I10 Essential (primary) hypertension: Secondary | ICD-10-CM | POA: Diagnosis not present

## 2016-04-24 DIAGNOSIS — M6281 Muscle weakness (generalized): Secondary | ICD-10-CM | POA: Diagnosis not present

## 2016-04-27 DIAGNOSIS — I5032 Chronic diastolic (congestive) heart failure: Secondary | ICD-10-CM | POA: Diagnosis not present

## 2016-04-27 DIAGNOSIS — F0280 Dementia in other diseases classified elsewhere without behavioral disturbance: Secondary | ICD-10-CM | POA: Diagnosis not present

## 2016-04-27 DIAGNOSIS — I10 Essential (primary) hypertension: Secondary | ICD-10-CM | POA: Diagnosis not present

## 2016-04-27 DIAGNOSIS — R2689 Other abnormalities of gait and mobility: Secondary | ICD-10-CM | POA: Diagnosis not present

## 2016-04-27 DIAGNOSIS — N183 Chronic kidney disease, stage 3 (moderate): Secondary | ICD-10-CM | POA: Diagnosis not present

## 2016-04-27 DIAGNOSIS — M6281 Muscle weakness (generalized): Secondary | ICD-10-CM | POA: Diagnosis not present

## 2016-04-28 DIAGNOSIS — M6281 Muscle weakness (generalized): Secondary | ICD-10-CM | POA: Diagnosis not present

## 2016-04-28 DIAGNOSIS — F0280 Dementia in other diseases classified elsewhere without behavioral disturbance: Secondary | ICD-10-CM | POA: Diagnosis not present

## 2016-04-28 DIAGNOSIS — I5032 Chronic diastolic (congestive) heart failure: Secondary | ICD-10-CM | POA: Diagnosis not present

## 2016-04-28 DIAGNOSIS — R2689 Other abnormalities of gait and mobility: Secondary | ICD-10-CM | POA: Diagnosis not present

## 2016-04-28 DIAGNOSIS — N183 Chronic kidney disease, stage 3 (moderate): Secondary | ICD-10-CM | POA: Diagnosis not present

## 2016-04-28 DIAGNOSIS — I10 Essential (primary) hypertension: Secondary | ICD-10-CM | POA: Diagnosis not present

## 2016-04-29 DIAGNOSIS — F0280 Dementia in other diseases classified elsewhere without behavioral disturbance: Secondary | ICD-10-CM | POA: Diagnosis not present

## 2016-04-29 DIAGNOSIS — R2689 Other abnormalities of gait and mobility: Secondary | ICD-10-CM | POA: Diagnosis not present

## 2016-04-29 DIAGNOSIS — M6281 Muscle weakness (generalized): Secondary | ICD-10-CM | POA: Diagnosis not present

## 2016-04-29 DIAGNOSIS — I5032 Chronic diastolic (congestive) heart failure: Secondary | ICD-10-CM | POA: Diagnosis not present

## 2016-04-29 DIAGNOSIS — N183 Chronic kidney disease, stage 3 (moderate): Secondary | ICD-10-CM | POA: Diagnosis not present

## 2016-04-29 DIAGNOSIS — I10 Essential (primary) hypertension: Secondary | ICD-10-CM | POA: Diagnosis not present

## 2016-04-30 DIAGNOSIS — R2689 Other abnormalities of gait and mobility: Secondary | ICD-10-CM | POA: Diagnosis not present

## 2016-04-30 DIAGNOSIS — N183 Chronic kidney disease, stage 3 (moderate): Secondary | ICD-10-CM | POA: Diagnosis not present

## 2016-04-30 DIAGNOSIS — I5032 Chronic diastolic (congestive) heart failure: Secondary | ICD-10-CM | POA: Diagnosis not present

## 2016-04-30 DIAGNOSIS — M6281 Muscle weakness (generalized): Secondary | ICD-10-CM | POA: Diagnosis not present

## 2016-04-30 DIAGNOSIS — F0280 Dementia in other diseases classified elsewhere without behavioral disturbance: Secondary | ICD-10-CM | POA: Diagnosis not present

## 2016-04-30 DIAGNOSIS — I10 Essential (primary) hypertension: Secondary | ICD-10-CM | POA: Diagnosis not present

## 2016-05-01 DIAGNOSIS — M6281 Muscle weakness (generalized): Secondary | ICD-10-CM | POA: Diagnosis not present

## 2016-05-01 DIAGNOSIS — N183 Chronic kidney disease, stage 3 (moderate): Secondary | ICD-10-CM | POA: Diagnosis not present

## 2016-05-01 DIAGNOSIS — R2689 Other abnormalities of gait and mobility: Secondary | ICD-10-CM | POA: Diagnosis not present

## 2016-05-01 DIAGNOSIS — I5032 Chronic diastolic (congestive) heart failure: Secondary | ICD-10-CM | POA: Diagnosis not present

## 2016-05-01 DIAGNOSIS — I10 Essential (primary) hypertension: Secondary | ICD-10-CM | POA: Diagnosis not present

## 2016-05-01 DIAGNOSIS — F0280 Dementia in other diseases classified elsewhere without behavioral disturbance: Secondary | ICD-10-CM | POA: Diagnosis not present

## 2016-05-04 DIAGNOSIS — M6281 Muscle weakness (generalized): Secondary | ICD-10-CM | POA: Diagnosis not present

## 2016-05-04 DIAGNOSIS — N183 Chronic kidney disease, stage 3 (moderate): Secondary | ICD-10-CM | POA: Diagnosis not present

## 2016-05-04 DIAGNOSIS — R2689 Other abnormalities of gait and mobility: Secondary | ICD-10-CM | POA: Diagnosis not present

## 2016-05-04 DIAGNOSIS — I5032 Chronic diastolic (congestive) heart failure: Secondary | ICD-10-CM | POA: Diagnosis not present

## 2016-05-04 DIAGNOSIS — I10 Essential (primary) hypertension: Secondary | ICD-10-CM | POA: Diagnosis not present

## 2016-05-04 DIAGNOSIS — F0280 Dementia in other diseases classified elsewhere without behavioral disturbance: Secondary | ICD-10-CM | POA: Diagnosis not present

## 2016-05-05 DIAGNOSIS — I5032 Chronic diastolic (congestive) heart failure: Secondary | ICD-10-CM | POA: Diagnosis not present

## 2016-05-05 DIAGNOSIS — I10 Essential (primary) hypertension: Secondary | ICD-10-CM | POA: Diagnosis not present

## 2016-05-05 DIAGNOSIS — F0280 Dementia in other diseases classified elsewhere without behavioral disturbance: Secondary | ICD-10-CM | POA: Diagnosis not present

## 2016-05-05 DIAGNOSIS — M6281 Muscle weakness (generalized): Secondary | ICD-10-CM | POA: Diagnosis not present

## 2016-05-05 DIAGNOSIS — N183 Chronic kidney disease, stage 3 (moderate): Secondary | ICD-10-CM | POA: Diagnosis not present

## 2016-05-05 DIAGNOSIS — R2689 Other abnormalities of gait and mobility: Secondary | ICD-10-CM | POA: Diagnosis not present

## 2016-05-06 DIAGNOSIS — M6281 Muscle weakness (generalized): Secondary | ICD-10-CM | POA: Diagnosis not present

## 2016-05-06 DIAGNOSIS — I10 Essential (primary) hypertension: Secondary | ICD-10-CM | POA: Diagnosis not present

## 2016-05-06 DIAGNOSIS — I5032 Chronic diastolic (congestive) heart failure: Secondary | ICD-10-CM | POA: Diagnosis not present

## 2016-05-06 DIAGNOSIS — F0280 Dementia in other diseases classified elsewhere without behavioral disturbance: Secondary | ICD-10-CM | POA: Diagnosis not present

## 2016-05-06 DIAGNOSIS — N183 Chronic kidney disease, stage 3 (moderate): Secondary | ICD-10-CM | POA: Diagnosis not present

## 2016-05-06 DIAGNOSIS — R2689 Other abnormalities of gait and mobility: Secondary | ICD-10-CM | POA: Diagnosis not present

## 2016-05-07 DIAGNOSIS — I10 Essential (primary) hypertension: Secondary | ICD-10-CM | POA: Diagnosis not present

## 2016-05-07 DIAGNOSIS — F0280 Dementia in other diseases classified elsewhere without behavioral disturbance: Secondary | ICD-10-CM | POA: Diagnosis not present

## 2016-05-07 DIAGNOSIS — M6281 Muscle weakness (generalized): Secondary | ICD-10-CM | POA: Diagnosis not present

## 2016-05-07 DIAGNOSIS — I5032 Chronic diastolic (congestive) heart failure: Secondary | ICD-10-CM | POA: Diagnosis not present

## 2016-05-07 DIAGNOSIS — R2689 Other abnormalities of gait and mobility: Secondary | ICD-10-CM | POA: Diagnosis not present

## 2016-05-07 DIAGNOSIS — N183 Chronic kidney disease, stage 3 (moderate): Secondary | ICD-10-CM | POA: Diagnosis not present

## 2016-05-08 DIAGNOSIS — F0280 Dementia in other diseases classified elsewhere without behavioral disturbance: Secondary | ICD-10-CM | POA: Diagnosis not present

## 2016-05-08 DIAGNOSIS — I10 Essential (primary) hypertension: Secondary | ICD-10-CM | POA: Diagnosis not present

## 2016-05-08 DIAGNOSIS — I5032 Chronic diastolic (congestive) heart failure: Secondary | ICD-10-CM | POA: Diagnosis not present

## 2016-05-08 DIAGNOSIS — R2689 Other abnormalities of gait and mobility: Secondary | ICD-10-CM | POA: Diagnosis not present

## 2016-05-08 DIAGNOSIS — M6281 Muscle weakness (generalized): Secondary | ICD-10-CM | POA: Diagnosis not present

## 2016-05-08 DIAGNOSIS — N183 Chronic kidney disease, stage 3 (moderate): Secondary | ICD-10-CM | POA: Diagnosis not present

## 2016-05-11 ENCOUNTER — Non-Acute Institutional Stay (SKILLED_NURSING_FACILITY): Payer: Medicare Other | Admitting: Adult Health

## 2016-05-11 ENCOUNTER — Encounter: Payer: Self-pay | Admitting: Adult Health

## 2016-05-11 DIAGNOSIS — Z933 Colostomy status: Secondary | ICD-10-CM | POA: Diagnosis not present

## 2016-05-11 DIAGNOSIS — J438 Other emphysema: Secondary | ICD-10-CM | POA: Diagnosis not present

## 2016-05-11 DIAGNOSIS — F0280 Dementia in other diseases classified elsewhere without behavioral disturbance: Secondary | ICD-10-CM | POA: Diagnosis not present

## 2016-05-11 DIAGNOSIS — J449 Chronic obstructive pulmonary disease, unspecified: Secondary | ICD-10-CM | POA: Diagnosis not present

## 2016-05-11 DIAGNOSIS — E785 Hyperlipidemia, unspecified: Secondary | ICD-10-CM

## 2016-05-11 DIAGNOSIS — E119 Type 2 diabetes mellitus without complications: Secondary | ICD-10-CM | POA: Diagnosis not present

## 2016-05-11 DIAGNOSIS — I6789 Other cerebrovascular disease: Secondary | ICD-10-CM | POA: Diagnosis not present

## 2016-05-11 DIAGNOSIS — I110 Hypertensive heart disease with heart failure: Secondary | ICD-10-CM

## 2016-05-11 DIAGNOSIS — F0151 Vascular dementia with behavioral disturbance: Secondary | ICD-10-CM

## 2016-05-11 DIAGNOSIS — E1151 Type 2 diabetes mellitus with diabetic peripheral angiopathy without gangrene: Secondary | ICD-10-CM | POA: Diagnosis not present

## 2016-05-11 DIAGNOSIS — F329 Major depressive disorder, single episode, unspecified: Secondary | ICD-10-CM | POA: Diagnosis not present

## 2016-05-11 DIAGNOSIS — I25119 Atherosclerotic heart disease of native coronary artery with unspecified angina pectoris: Secondary | ICD-10-CM | POA: Diagnosis not present

## 2016-05-11 DIAGNOSIS — I6350 Cerebral infarction due to unspecified occlusion or stenosis of unspecified cerebral artery: Secondary | ICD-10-CM | POA: Diagnosis not present

## 2016-05-11 DIAGNOSIS — I5032 Chronic diastolic (congestive) heart failure: Secondary | ICD-10-CM

## 2016-05-11 DIAGNOSIS — N183 Chronic kidney disease, stage 3 (moderate): Secondary | ICD-10-CM | POA: Diagnosis not present

## 2016-05-11 DIAGNOSIS — F0153 Vascular dementia, unspecified severity, with mood disturbance: Secondary | ICD-10-CM

## 2016-05-11 DIAGNOSIS — M6281 Muscle weakness (generalized): Secondary | ICD-10-CM | POA: Diagnosis not present

## 2016-05-11 DIAGNOSIS — N390 Urinary tract infection, site not specified: Secondary | ICD-10-CM | POA: Diagnosis not present

## 2016-05-11 DIAGNOSIS — I10 Essential (primary) hypertension: Secondary | ICD-10-CM | POA: Diagnosis not present

## 2016-05-11 DIAGNOSIS — R2689 Other abnormalities of gait and mobility: Secondary | ICD-10-CM | POA: Diagnosis not present

## 2016-05-11 DIAGNOSIS — E1169 Type 2 diabetes mellitus with other specified complication: Secondary | ICD-10-CM

## 2016-05-11 DIAGNOSIS — K219 Gastro-esophageal reflux disease without esophagitis: Secondary | ICD-10-CM | POA: Diagnosis not present

## 2016-05-11 HISTORY — DX: Hypertensive heart disease with heart failure: I11.0

## 2016-05-11 NOTE — Progress Notes (Signed)
Patient ID: Sheila Flynn, female   DOB: February 06, 1946, 70 y.o.   MRN: [id number]   Location:   [place] Room Number: 128-A Place of Service:  SNF (31)   CODE STATUS: Full Cose  Allergies  Allergen Reactions  . Ace Inhibitors Swelling    Chief Complaint  Patient presents with  . Medical Management of Chronic Issues    Follow up    HPI:  She is a long term resident of this facility being seen for the management of her chronic illnesses. Overall her status is stable. She tells me that she is feeling good. She does get out of bed on a daily basis. There are no nursing concerns at this time.   Past Medical History  Diagnosis Date  . 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 Chronic diastolic CHF (congestive heart failure) ([place])   . CAD (coronary artery disease)     Dr. Matthew Saras  . Angina at rest [facility])   . Hyperlipidemia LDL goal < 100   . Aspiration pneumonia ([place])   . COPD (chronic obstructive pulmonary disease) ([place])   . Acute respiratory failure ([place]) 12/21/10  . Pulmonary hypertension ([place])   . Chronic constipation   . Rectal cancer ([place])     s/p colostomy  . Thyroid goiter   . 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 Vascular dementia with depressed mood   . Pneumonia 12/21/10  . Delirium, induced by drug ([place])     polypharmacy  . Sacral fracture, closed ([place])     s/p kyphoplasty  . Fall     history of falls  . Dementia   . GERD (gastroesophageal reflux disease)   . Major depressive disorder ([place])   . CKD (chronic kidney disease)   . Myocardial infarction ([place])     "I've had about 3" (11/06/2014)  . DM (diabetes mellitus) type II controlled peripheral vascular disorder ([place])   . On home oxygen therapy     "prn at the nursing home" (11/06/2014)  . Migraine     "monthly" (11/06/2014)  . Stroke [facility]) 2011    postop  . Stroke [facility]) ?2014    "left side sometimes weaker  since" (11/06/2014)    Past Surgical History  Procedure Laterality Date  . Kyphoplasty      sacrum  . Colostomy      rectal ca  . Appendectomy    . Tonsillectomy    . Abdominal hysterectomy    . Tubal ligation    . Colon surgery    . Right cataract  11-19-2015    Social History   Social History  . Marital Status: Widowed    Spouse Name: N/A  . Number of Children: N/A  . Years of Education: N/A   Occupational History  .      custodial   Social History Main Topics  . Smoking status: Former Smoker -- 0.50 packs/day for 2 years    Types: Cigarettes  . Smokeless tobacco: Never Used     Comment: "quit smoking cigarettes in 1969"  . Alcohol Use: No  . Drug Use: Yes     Comment: previously smoked crack, has had positive UDS for amphetamines and narcotics  . Sexual Activity: No   Other Topics Concern  . 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 No pertinent family history.    VITAL SIGNS BP 121/62 mmHg  Pulse 69  Temp(Src) 96.9 F (36.1 C) (Oral)  Resp 17  Ht 5\' 7"  (1.702 m)  Wt 177 lb (80.287 kg)  BMI 27.72 kg/m2  SpO2 98%  Patient's Medications  New Prescriptions   No medications on file  Previous Medications   ACETAMINOPHEN (TYLENOL) 325 MG TABLET    Take 650 mg by mouth every 6 (six) hours as needed for mild pain.   AMLODIPINE (NORVASC) 10 MG TABLET    Take 10 mg by mouth daily.   ARTIFICIAL TEAR SOLUTION (TEARS PURE) 0.1-0.3 % SOLN    Apply 1 drop to eye every 8 (eight) hours as needed.   ASPIRIN (ASPIRIN EC) 81 MG EC TABLET    Take 81 mg by mouth daily. Swallow whole.   ATORVASTATIN (LIPITOR) 80 MG TABLET    Take 1 tablet (80 mg total) by mouth daily.   CALCITRIOL (ROCALTROL) 0.25 MCG CAPSULE    Take 0.25 mcg by mouth daily.   CARVEDILOL (COREG) 6.25 MG TABLET    Take 6.25 mg by mouth 2 (two) times daily with a meal.    CHOLECALCIFEROL 50000 UNITS TABS    Take 50,000 Units by mouth. Take one tablet by mouth one time a day  Starting on the 14 th and ending on the 14 th every month.   DOCUSATE SODIUM (COLACE) 100 MG CAPSULE    Take 100 mg by mouth 3 (three) times daily.   GLIPIZIDE (GLUCOTROL XL) 5 MG 24 HR TABLET    Take 5 mg by mouth daily with breakfast.   GLIPIZIDE (GLUCOTROL) 5 MG TABLET    Take 0.5 tablets (2.5 mg total) by mouth daily before breakfast.   HYDRALAZINE (APRESOLINE) 25 MG TABLET    Take 25 mg by mouth 2 (two) times daily.    IPRATROPIUM-ALBUTEROL (DUONEB) 0.5-2.5 (3) MG/3ML SOLN    Take 3 mLs by nebulization every 6 (six) hours as needed (shortness of breathe/wheezing).   ISOSORBIDE MONONITRATE (ISMO,MONOKET) 20 MG TABLET    Take 60 mg by mouth daily.    LOSARTAN (COZAAR) 50 MG TABLET    Take 50 mg by mouth 2 (two) times daily.   NITROGLYCERIN (NITROSTAT) 0.4 MG SL TABLET    Place 0.4 mg under the tongue every 5 (five) minutes as needed for chest pain.   OMEPRAZOLE (PRILOSEC) 20 MG CAPSULE    Take 20 mg by mouth daily.    PAROXETINE (PAXIL) 20 MG TABLET    Take by mouth. Take 1.5 tablet by mouth daily.   POTASSIUM CHLORIDE SA (K-DUR,KLOR-CON) 20 MEQ TABLET    Take 20 mEq by mouth daily.   PREDNISOLONE 5 MG TABS TABLET    Take 5 mg by mouth daily. Reported on 01/23/2016   PROMETHAZINE (PHENERGAN) 25 MG TABLET    Take 25 mg by mouth every 6 (six) hours as needed for nausea or vomiting. Reported on 01/23/2016   PROMETHAZINE (PHENERGAN) 25 MG/ML INJECTION    Inject 25 mg into the vein every 6 (six) hours as needed for nausea or vomiting.   SENNOSIDES-DOCUSATE SODIUM (SENOKOT-S) 8.6-50 MG TABLET    Take 2 tablets by mouth every evening.    TRAZODONE (DESYREL) 50 MG TABLET    Take 50 mg by mouth at bedtime.  Modified Medications   No medications on file  Discontinued Medications   BUSPIRONE (BUSPAR) 5 MG TABLET    Take 5  mg by mouth. Reported on 05/11/2016   LOSARTAN (COZAAR) 25 MG TABLET    Take 2  tablets (50 mg total) by mouth daily.   NEPAFENAC (ILEVRO) 0.3 % OPHTHALMIC SUSPENSION    Place 1 drop into the left eye at bedtime. Reported on 05/11/2016   TRAMADOL (ULTRAM) 50 MG TABLET    Take one tablet by mouth every 8 hours as needed for pain     SIGNIFICANT DIAGNOSTIC EXAMS  11-06-14: chest x-ray: Probable left lower lobe retrocardiac opacity, atelectasis versuspneumonia. Grossly stable cardiomegaly allowing for differences in technique.   LABS REVIEWED:   07-22-15: chol 179; ldl 104; trig 112; hdl 53; liver normal albumin 4.0 09-04-15: hgb a1c 7.2; urine micro-albumin 11.3 11-22-14: glucose 61; bun 21; creat 1.41; k+ 3.7; na++144; phos 4.7; pth 67; ca++9.2  01-24-16: wbc 4.2; hgb 12.;4 hct 38.4; mcv 80.3 plt 246; glucose 74; bun 12; creat 1.3; k+ 3.3; na++ 143; liver normal albumin 3.6; chol 145; ldl 78; tri 94; hdl 48; tsh 0.54; hb a1c 6.8;  03-13-16: glucose 101; bun 13; creat 1.35; k+ 2.8; na++141 03-16-16: glucose 61; bun 18; creat 1.35; k+ 3.5; na++ 142 03-28-16: urine culture: e-coli: cipro  04-17-16: hgb a1c 6.3       Review of Systems Constitutional: Negative for appetite change and fatigue.  HENT: Negative for congestion.   Respiratory: Negative for cough, chest tightness and shortness of breath.   Cardiovascular: Negative for chest pain, palpitations and leg swelling.  Gastrointestinal: Negative for nausea, abdominal pain, diarrhea and constipation.  Musculoskeletal: Negative for myalgias and arthralgias.  Skin: Negative for pallor.  Neurological: Negative for dizziness.  Psychiatric/Behavioral: The patient is not nervous/anxious.       Physical Exam Constitutional: No distress.  Eyes: Conjunctivae are normal.  Neck: Neck supple. No JVD present. No thyromegaly present.  Cardiovascular: Normal rate, regular rhythm and intact distal pulses.   Respiratory: Effort normal and breath sounds normal. No respiratory distress. She has no wheezes.  GI: Soft. Bowel sounds are  normal. She exhibits no distension. There is no tenderness.  Has colostomy   Musculoskeletal: She exhibits no edema.  Able to move all extremities   Lymphadenopathy:    She has no cervical adenopathy.  Neurological: She is alert.  Skin: Skin is warm and dry. She is not diaphoretic.  Psychiatric: She has a normal mood and affect.      ASSESSMENT/ PLAN:  1. Hypertension: will continue norvasc 10 mg daily;  hydralazine 25 mg twice daily  coreg  6.25 mg twice daily cozaar  50 mg twice daily   2. Dyslipidemia: will continue lipitor  80 mg daily  ldl is 78  3. Diabetes: will continue  glipizide to 2.5 mg  daily hgb a1c is 6.3  4. COPD: is stable will continue duoneb every 6 hours as needed; will continue prednisone 5 mg daily and will monitor her status.   5. CAD: no complaints of chest pain present will continue imdur 60 mg daily; asa 81 mg daily and prn ntg; will monitor  6. Jerrye Bushy: will continue  prilosec 20 mg daily    7. Constipation: will continue colace three times daily; and senna s 2 tabs daily   8. Depression: will continue paxil 30 mg daily. Take trazodone 50 mg nightly   9.  CVA: is neurologically stable; will continue asa 81 mg daily   10. Vascular dementia: on change in her status; is presently not on medications; will not make changes will monitor  her status.   11. Diastolic heart failure: will continue  hydralazine 25 mg twice daily imdur 60 mg daily  Will continue coreg  6.25 mg twice daily and will monitor her status.           Ok Edwards NP [facility]  Contact [phone] Monday through Friday 8am- 5pm  After hours call [phone]

## 2016-05-12 DIAGNOSIS — R2689 Other abnormalities of gait and mobility: Secondary | ICD-10-CM | POA: Diagnosis not present

## 2016-05-12 DIAGNOSIS — I5032 Chronic diastolic (congestive) heart failure: Secondary | ICD-10-CM | POA: Diagnosis not present

## 2016-05-12 DIAGNOSIS — F0280 Dementia in other diseases classified elsewhere without behavioral disturbance: Secondary | ICD-10-CM | POA: Diagnosis not present

## 2016-05-12 DIAGNOSIS — M6281 Muscle weakness (generalized): Secondary | ICD-10-CM | POA: Diagnosis not present

## 2016-05-12 DIAGNOSIS — I10 Essential (primary) hypertension: Secondary | ICD-10-CM | POA: Diagnosis not present

## 2016-05-12 DIAGNOSIS — N183 Chronic kidney disease, stage 3 (moderate): Secondary | ICD-10-CM | POA: Diagnosis not present

## 2016-05-13 DIAGNOSIS — I5032 Chronic diastolic (congestive) heart failure: Secondary | ICD-10-CM | POA: Diagnosis not present

## 2016-05-13 DIAGNOSIS — N183 Chronic kidney disease, stage 3 (moderate): Secondary | ICD-10-CM | POA: Diagnosis not present

## 2016-05-13 DIAGNOSIS — R2689 Other abnormalities of gait and mobility: Secondary | ICD-10-CM | POA: Diagnosis not present

## 2016-05-13 DIAGNOSIS — F0280 Dementia in other diseases classified elsewhere without behavioral disturbance: Secondary | ICD-10-CM | POA: Diagnosis not present

## 2016-05-13 DIAGNOSIS — M6281 Muscle weakness (generalized): Secondary | ICD-10-CM | POA: Diagnosis not present

## 2016-05-13 DIAGNOSIS — I10 Essential (primary) hypertension: Secondary | ICD-10-CM | POA: Diagnosis not present

## 2016-05-14 DIAGNOSIS — I5032 Chronic diastolic (congestive) heart failure: Secondary | ICD-10-CM | POA: Diagnosis not present

## 2016-05-14 DIAGNOSIS — F0280 Dementia in other diseases classified elsewhere without behavioral disturbance: Secondary | ICD-10-CM | POA: Diagnosis not present

## 2016-05-14 DIAGNOSIS — N183 Chronic kidney disease, stage 3 (moderate): Secondary | ICD-10-CM | POA: Diagnosis not present

## 2016-05-14 DIAGNOSIS — R2689 Other abnormalities of gait and mobility: Secondary | ICD-10-CM | POA: Diagnosis not present

## 2016-05-14 DIAGNOSIS — M6281 Muscle weakness (generalized): Secondary | ICD-10-CM | POA: Diagnosis not present

## 2016-05-14 DIAGNOSIS — I10 Essential (primary) hypertension: Secondary | ICD-10-CM | POA: Diagnosis not present

## 2016-05-15 DIAGNOSIS — M6281 Muscle weakness (generalized): Secondary | ICD-10-CM | POA: Diagnosis not present

## 2016-05-15 DIAGNOSIS — N183 Chronic kidney disease, stage 3 (moderate): Secondary | ICD-10-CM | POA: Diagnosis not present

## 2016-05-15 DIAGNOSIS — I10 Essential (primary) hypertension: Secondary | ICD-10-CM | POA: Diagnosis not present

## 2016-05-15 DIAGNOSIS — I5032 Chronic diastolic (congestive) heart failure: Secondary | ICD-10-CM | POA: Diagnosis not present

## 2016-05-15 DIAGNOSIS — F0280 Dementia in other diseases classified elsewhere without behavioral disturbance: Secondary | ICD-10-CM | POA: Diagnosis not present

## 2016-05-15 DIAGNOSIS — R2689 Other abnormalities of gait and mobility: Secondary | ICD-10-CM | POA: Diagnosis not present

## 2016-05-18 DIAGNOSIS — I10 Essential (primary) hypertension: Secondary | ICD-10-CM | POA: Diagnosis not present

## 2016-05-18 DIAGNOSIS — R2689 Other abnormalities of gait and mobility: Secondary | ICD-10-CM | POA: Diagnosis not present

## 2016-05-18 DIAGNOSIS — I5032 Chronic diastolic (congestive) heart failure: Secondary | ICD-10-CM | POA: Diagnosis not present

## 2016-05-18 DIAGNOSIS — F0280 Dementia in other diseases classified elsewhere without behavioral disturbance: Secondary | ICD-10-CM | POA: Diagnosis not present

## 2016-05-18 DIAGNOSIS — N183 Chronic kidney disease, stage 3 (moderate): Secondary | ICD-10-CM | POA: Diagnosis not present

## 2016-05-18 DIAGNOSIS — M6281 Muscle weakness (generalized): Secondary | ICD-10-CM | POA: Diagnosis not present

## 2016-05-19 DIAGNOSIS — F0280 Dementia in other diseases classified elsewhere without behavioral disturbance: Secondary | ICD-10-CM | POA: Diagnosis not present

## 2016-05-19 DIAGNOSIS — I10 Essential (primary) hypertension: Secondary | ICD-10-CM | POA: Diagnosis not present

## 2016-05-19 DIAGNOSIS — N183 Chronic kidney disease, stage 3 (moderate): Secondary | ICD-10-CM | POA: Diagnosis not present

## 2016-05-19 DIAGNOSIS — I5032 Chronic diastolic (congestive) heart failure: Secondary | ICD-10-CM | POA: Diagnosis not present

## 2016-05-19 DIAGNOSIS — M6281 Muscle weakness (generalized): Secondary | ICD-10-CM | POA: Diagnosis not present

## 2016-05-19 DIAGNOSIS — R2689 Other abnormalities of gait and mobility: Secondary | ICD-10-CM | POA: Diagnosis not present

## 2016-05-20 DIAGNOSIS — R2689 Other abnormalities of gait and mobility: Secondary | ICD-10-CM | POA: Diagnosis not present

## 2016-05-20 DIAGNOSIS — F0280 Dementia in other diseases classified elsewhere without behavioral disturbance: Secondary | ICD-10-CM | POA: Diagnosis not present

## 2016-05-20 DIAGNOSIS — I10 Essential (primary) hypertension: Secondary | ICD-10-CM | POA: Diagnosis not present

## 2016-05-20 DIAGNOSIS — I5032 Chronic diastolic (congestive) heart failure: Secondary | ICD-10-CM | POA: Diagnosis not present

## 2016-05-20 DIAGNOSIS — N183 Chronic kidney disease, stage 3 (moderate): Secondary | ICD-10-CM | POA: Diagnosis not present

## 2016-05-20 DIAGNOSIS — M6281 Muscle weakness (generalized): Secondary | ICD-10-CM | POA: Diagnosis not present

## 2016-05-21 DIAGNOSIS — F0280 Dementia in other diseases classified elsewhere without behavioral disturbance: Secondary | ICD-10-CM | POA: Diagnosis not present

## 2016-05-21 DIAGNOSIS — R2689 Other abnormalities of gait and mobility: Secondary | ICD-10-CM | POA: Diagnosis not present

## 2016-05-21 DIAGNOSIS — N183 Chronic kidney disease, stage 3 (moderate): Secondary | ICD-10-CM | POA: Diagnosis not present

## 2016-05-21 DIAGNOSIS — M6281 Muscle weakness (generalized): Secondary | ICD-10-CM | POA: Diagnosis not present

## 2016-05-21 DIAGNOSIS — I5032 Chronic diastolic (congestive) heart failure: Secondary | ICD-10-CM | POA: Diagnosis not present

## 2016-05-21 DIAGNOSIS — I10 Essential (primary) hypertension: Secondary | ICD-10-CM | POA: Diagnosis not present

## 2016-05-22 DIAGNOSIS — F0280 Dementia in other diseases classified elsewhere without behavioral disturbance: Secondary | ICD-10-CM | POA: Diagnosis not present

## 2016-05-22 DIAGNOSIS — N183 Chronic kidney disease, stage 3 (moderate): Secondary | ICD-10-CM | POA: Diagnosis not present

## 2016-05-22 DIAGNOSIS — I5032 Chronic diastolic (congestive) heart failure: Secondary | ICD-10-CM | POA: Diagnosis not present

## 2016-05-22 DIAGNOSIS — M6281 Muscle weakness (generalized): Secondary | ICD-10-CM | POA: Diagnosis not present

## 2016-05-22 DIAGNOSIS — I10 Essential (primary) hypertension: Secondary | ICD-10-CM | POA: Diagnosis not present

## 2016-05-22 DIAGNOSIS — R2689 Other abnormalities of gait and mobility: Secondary | ICD-10-CM | POA: Diagnosis not present

## 2016-05-25 DIAGNOSIS — F0280 Dementia in other diseases classified elsewhere without behavioral disturbance: Secondary | ICD-10-CM | POA: Diagnosis not present

## 2016-05-25 DIAGNOSIS — I10 Essential (primary) hypertension: Secondary | ICD-10-CM | POA: Diagnosis not present

## 2016-05-25 DIAGNOSIS — M6281 Muscle weakness (generalized): Secondary | ICD-10-CM | POA: Diagnosis not present

## 2016-05-25 DIAGNOSIS — N183 Chronic kidney disease, stage 3 (moderate): Secondary | ICD-10-CM | POA: Diagnosis not present

## 2016-05-25 DIAGNOSIS — I5032 Chronic diastolic (congestive) heart failure: Secondary | ICD-10-CM | POA: Diagnosis not present

## 2016-05-25 DIAGNOSIS — R2689 Other abnormalities of gait and mobility: Secondary | ICD-10-CM | POA: Diagnosis not present

## 2016-05-26 DIAGNOSIS — N183 Chronic kidney disease, stage 3 (moderate): Secondary | ICD-10-CM | POA: Diagnosis not present

## 2016-05-26 DIAGNOSIS — F0280 Dementia in other diseases classified elsewhere without behavioral disturbance: Secondary | ICD-10-CM | POA: Diagnosis not present

## 2016-05-26 DIAGNOSIS — I5032 Chronic diastolic (congestive) heart failure: Secondary | ICD-10-CM | POA: Diagnosis not present

## 2016-05-26 DIAGNOSIS — R2689 Other abnormalities of gait and mobility: Secondary | ICD-10-CM | POA: Diagnosis not present

## 2016-05-26 DIAGNOSIS — M6281 Muscle weakness (generalized): Secondary | ICD-10-CM | POA: Diagnosis not present

## 2016-05-26 DIAGNOSIS — I10 Essential (primary) hypertension: Secondary | ICD-10-CM | POA: Diagnosis not present

## 2016-05-27 DIAGNOSIS — R2689 Other abnormalities of gait and mobility: Secondary | ICD-10-CM | POA: Diagnosis not present

## 2016-05-27 DIAGNOSIS — M6281 Muscle weakness (generalized): Secondary | ICD-10-CM | POA: Diagnosis not present

## 2016-05-27 DIAGNOSIS — N183 Chronic kidney disease, stage 3 (moderate): Secondary | ICD-10-CM | POA: Diagnosis not present

## 2016-05-27 DIAGNOSIS — I5032 Chronic diastolic (congestive) heart failure: Secondary | ICD-10-CM | POA: Diagnosis not present

## 2016-05-27 DIAGNOSIS — I10 Essential (primary) hypertension: Secondary | ICD-10-CM | POA: Diagnosis not present

## 2016-05-27 DIAGNOSIS — F0280 Dementia in other diseases classified elsewhere without behavioral disturbance: Secondary | ICD-10-CM | POA: Diagnosis not present

## 2016-05-29 DIAGNOSIS — I5032 Chronic diastolic (congestive) heart failure: Secondary | ICD-10-CM | POA: Diagnosis not present

## 2016-05-29 DIAGNOSIS — I10 Essential (primary) hypertension: Secondary | ICD-10-CM | POA: Diagnosis not present

## 2016-05-29 DIAGNOSIS — M6281 Muscle weakness (generalized): Secondary | ICD-10-CM | POA: Diagnosis not present

## 2016-05-29 DIAGNOSIS — N183 Chronic kidney disease, stage 3 (moderate): Secondary | ICD-10-CM | POA: Diagnosis not present

## 2016-05-29 DIAGNOSIS — R2689 Other abnormalities of gait and mobility: Secondary | ICD-10-CM | POA: Diagnosis not present

## 2016-05-29 DIAGNOSIS — F0280 Dementia in other diseases classified elsewhere without behavioral disturbance: Secondary | ICD-10-CM | POA: Diagnosis not present

## 2016-05-30 DIAGNOSIS — F0280 Dementia in other diseases classified elsewhere without behavioral disturbance: Secondary | ICD-10-CM | POA: Diagnosis not present

## 2016-05-30 DIAGNOSIS — N183 Chronic kidney disease, stage 3 (moderate): Secondary | ICD-10-CM | POA: Diagnosis not present

## 2016-05-30 DIAGNOSIS — M6281 Muscle weakness (generalized): Secondary | ICD-10-CM | POA: Diagnosis not present

## 2016-05-30 DIAGNOSIS — R2689 Other abnormalities of gait and mobility: Secondary | ICD-10-CM | POA: Diagnosis not present

## 2016-05-30 DIAGNOSIS — I10 Essential (primary) hypertension: Secondary | ICD-10-CM | POA: Diagnosis not present

## 2016-05-30 DIAGNOSIS — I5032 Chronic diastolic (congestive) heart failure: Secondary | ICD-10-CM | POA: Diagnosis not present

## 2016-06-01 DIAGNOSIS — R2689 Other abnormalities of gait and mobility: Secondary | ICD-10-CM | POA: Diagnosis not present

## 2016-06-01 DIAGNOSIS — N183 Chronic kidney disease, stage 3 (moderate): Secondary | ICD-10-CM | POA: Diagnosis not present

## 2016-06-01 DIAGNOSIS — I5032 Chronic diastolic (congestive) heart failure: Secondary | ICD-10-CM | POA: Diagnosis not present

## 2016-06-01 DIAGNOSIS — F0280 Dementia in other diseases classified elsewhere without behavioral disturbance: Secondary | ICD-10-CM | POA: Diagnosis not present

## 2016-06-01 DIAGNOSIS — I10 Essential (primary) hypertension: Secondary | ICD-10-CM | POA: Diagnosis not present

## 2016-06-01 DIAGNOSIS — M6281 Muscle weakness (generalized): Secondary | ICD-10-CM | POA: Diagnosis not present

## 2016-06-02 DIAGNOSIS — N183 Chronic kidney disease, stage 3 (moderate): Secondary | ICD-10-CM | POA: Diagnosis not present

## 2016-06-02 DIAGNOSIS — F0280 Dementia in other diseases classified elsewhere without behavioral disturbance: Secondary | ICD-10-CM | POA: Diagnosis not present

## 2016-06-02 DIAGNOSIS — R2689 Other abnormalities of gait and mobility: Secondary | ICD-10-CM | POA: Diagnosis not present

## 2016-06-02 DIAGNOSIS — M6281 Muscle weakness (generalized): Secondary | ICD-10-CM | POA: Diagnosis not present

## 2016-06-02 DIAGNOSIS — I5032 Chronic diastolic (congestive) heart failure: Secondary | ICD-10-CM | POA: Diagnosis not present

## 2016-06-02 DIAGNOSIS — I10 Essential (primary) hypertension: Secondary | ICD-10-CM | POA: Diagnosis not present

## 2016-06-03 DIAGNOSIS — M6281 Muscle weakness (generalized): Secondary | ICD-10-CM | POA: Diagnosis not present

## 2016-06-03 DIAGNOSIS — I5032 Chronic diastolic (congestive) heart failure: Secondary | ICD-10-CM | POA: Diagnosis not present

## 2016-06-03 DIAGNOSIS — R2689 Other abnormalities of gait and mobility: Secondary | ICD-10-CM | POA: Diagnosis not present

## 2016-06-03 DIAGNOSIS — I10 Essential (primary) hypertension: Secondary | ICD-10-CM | POA: Diagnosis not present

## 2016-06-03 DIAGNOSIS — N183 Chronic kidney disease, stage 3 (moderate): Secondary | ICD-10-CM | POA: Diagnosis not present

## 2016-06-03 DIAGNOSIS — F0280 Dementia in other diseases classified elsewhere without behavioral disturbance: Secondary | ICD-10-CM | POA: Diagnosis not present

## 2016-06-04 DIAGNOSIS — M6281 Muscle weakness (generalized): Secondary | ICD-10-CM | POA: Diagnosis not present

## 2016-06-04 DIAGNOSIS — N183 Chronic kidney disease, stage 3 (moderate): Secondary | ICD-10-CM | POA: Diagnosis not present

## 2016-06-04 DIAGNOSIS — F0280 Dementia in other diseases classified elsewhere without behavioral disturbance: Secondary | ICD-10-CM | POA: Diagnosis not present

## 2016-06-04 DIAGNOSIS — I10 Essential (primary) hypertension: Secondary | ICD-10-CM | POA: Diagnosis not present

## 2016-06-04 DIAGNOSIS — I5032 Chronic diastolic (congestive) heart failure: Secondary | ICD-10-CM | POA: Diagnosis not present

## 2016-06-04 DIAGNOSIS — R2689 Other abnormalities of gait and mobility: Secondary | ICD-10-CM | POA: Diagnosis not present

## 2016-06-05 DIAGNOSIS — N183 Chronic kidney disease, stage 3 (moderate): Secondary | ICD-10-CM | POA: Diagnosis not present

## 2016-06-05 DIAGNOSIS — I10 Essential (primary) hypertension: Secondary | ICD-10-CM | POA: Diagnosis not present

## 2016-06-05 DIAGNOSIS — R2689 Other abnormalities of gait and mobility: Secondary | ICD-10-CM | POA: Diagnosis not present

## 2016-06-05 DIAGNOSIS — I5032 Chronic diastolic (congestive) heart failure: Secondary | ICD-10-CM | POA: Diagnosis not present

## 2016-06-05 DIAGNOSIS — M6281 Muscle weakness (generalized): Secondary | ICD-10-CM | POA: Diagnosis not present

## 2016-06-05 DIAGNOSIS — F0280 Dementia in other diseases classified elsewhere without behavioral disturbance: Secondary | ICD-10-CM | POA: Diagnosis not present

## 2016-06-08 DIAGNOSIS — I5032 Chronic diastolic (congestive) heart failure: Secondary | ICD-10-CM | POA: Diagnosis not present

## 2016-06-08 DIAGNOSIS — N183 Chronic kidney disease, stage 3 (moderate): Secondary | ICD-10-CM | POA: Diagnosis not present

## 2016-06-08 DIAGNOSIS — R2689 Other abnormalities of gait and mobility: Secondary | ICD-10-CM | POA: Diagnosis not present

## 2016-06-08 DIAGNOSIS — F0280 Dementia in other diseases classified elsewhere without behavioral disturbance: Secondary | ICD-10-CM | POA: Diagnosis not present

## 2016-06-08 DIAGNOSIS — I10 Essential (primary) hypertension: Secondary | ICD-10-CM | POA: Diagnosis not present

## 2016-06-08 DIAGNOSIS — M6281 Muscle weakness (generalized): Secondary | ICD-10-CM | POA: Diagnosis not present

## 2016-06-09 DIAGNOSIS — K219 Gastro-esophageal reflux disease without esophagitis: Secondary | ICD-10-CM | POA: Diagnosis not present

## 2016-06-09 DIAGNOSIS — F0280 Dementia in other diseases classified elsewhere without behavioral disturbance: Secondary | ICD-10-CM | POA: Diagnosis not present

## 2016-06-09 DIAGNOSIS — E119 Type 2 diabetes mellitus without complications: Secondary | ICD-10-CM | POA: Diagnosis not present

## 2016-06-09 DIAGNOSIS — N183 Chronic kidney disease, stage 3 (moderate): Secondary | ICD-10-CM | POA: Diagnosis not present

## 2016-06-09 DIAGNOSIS — N390 Urinary tract infection, site not specified: Secondary | ICD-10-CM | POA: Diagnosis not present

## 2016-06-09 DIAGNOSIS — J449 Chronic obstructive pulmonary disease, unspecified: Secondary | ICD-10-CM | POA: Diagnosis not present

## 2016-06-09 DIAGNOSIS — F329 Major depressive disorder, single episode, unspecified: Secondary | ICD-10-CM | POA: Diagnosis not present

## 2016-06-09 DIAGNOSIS — M6281 Muscle weakness (generalized): Secondary | ICD-10-CM | POA: Diagnosis not present

## 2016-06-09 DIAGNOSIS — R2689 Other abnormalities of gait and mobility: Secondary | ICD-10-CM | POA: Diagnosis not present

## 2016-06-09 DIAGNOSIS — I6789 Other cerebrovascular disease: Secondary | ICD-10-CM | POA: Diagnosis not present

## 2016-06-09 DIAGNOSIS — E785 Hyperlipidemia, unspecified: Secondary | ICD-10-CM | POA: Diagnosis not present

## 2016-06-09 DIAGNOSIS — I10 Essential (primary) hypertension: Secondary | ICD-10-CM | POA: Diagnosis not present

## 2016-06-09 DIAGNOSIS — I5032 Chronic diastolic (congestive) heart failure: Secondary | ICD-10-CM | POA: Diagnosis not present

## 2016-06-10 DIAGNOSIS — M6281 Muscle weakness (generalized): Secondary | ICD-10-CM | POA: Diagnosis not present

## 2016-06-10 DIAGNOSIS — I5032 Chronic diastolic (congestive) heart failure: Secondary | ICD-10-CM | POA: Diagnosis not present

## 2016-06-10 DIAGNOSIS — R2689 Other abnormalities of gait and mobility: Secondary | ICD-10-CM | POA: Diagnosis not present

## 2016-06-10 DIAGNOSIS — F0280 Dementia in other diseases classified elsewhere without behavioral disturbance: Secondary | ICD-10-CM | POA: Diagnosis not present

## 2016-06-10 DIAGNOSIS — I10 Essential (primary) hypertension: Secondary | ICD-10-CM | POA: Diagnosis not present

## 2016-06-10 DIAGNOSIS — N183 Chronic kidney disease, stage 3 (moderate): Secondary | ICD-10-CM | POA: Diagnosis not present

## 2016-06-11 DIAGNOSIS — I5032 Chronic diastolic (congestive) heart failure: Secondary | ICD-10-CM | POA: Diagnosis not present

## 2016-06-11 DIAGNOSIS — M6281 Muscle weakness (generalized): Secondary | ICD-10-CM | POA: Diagnosis not present

## 2016-06-11 DIAGNOSIS — F0280 Dementia in other diseases classified elsewhere without behavioral disturbance: Secondary | ICD-10-CM | POA: Diagnosis not present

## 2016-06-11 DIAGNOSIS — R2689 Other abnormalities of gait and mobility: Secondary | ICD-10-CM | POA: Diagnosis not present

## 2016-06-11 DIAGNOSIS — I10 Essential (primary) hypertension: Secondary | ICD-10-CM | POA: Diagnosis not present

## 2016-06-11 DIAGNOSIS — N183 Chronic kidney disease, stage 3 (moderate): Secondary | ICD-10-CM | POA: Diagnosis not present

## 2016-06-12 ENCOUNTER — Encounter: Payer: Self-pay | Admitting: Adult Health

## 2016-06-12 ENCOUNTER — Non-Acute Institutional Stay (SKILLED_NURSING_FACILITY): Payer: Medicare Other | Admitting: Adult Health

## 2016-06-12 DIAGNOSIS — I5032 Chronic diastolic (congestive) heart failure: Secondary | ICD-10-CM

## 2016-06-12 DIAGNOSIS — E1151 Type 2 diabetes mellitus with diabetic peripheral angiopathy without gangrene: Secondary | ICD-10-CM | POA: Diagnosis not present

## 2016-06-12 DIAGNOSIS — I6350 Cerebral infarction due to unspecified occlusion or stenosis of unspecified cerebral artery: Secondary | ICD-10-CM | POA: Diagnosis not present

## 2016-06-12 DIAGNOSIS — F0280 Dementia in other diseases classified elsewhere without behavioral disturbance: Secondary | ICD-10-CM | POA: Diagnosis not present

## 2016-06-12 DIAGNOSIS — J438 Other emphysema: Secondary | ICD-10-CM | POA: Diagnosis not present

## 2016-06-12 DIAGNOSIS — F329 Major depressive disorder, single episode, unspecified: Secondary | ICD-10-CM

## 2016-06-12 DIAGNOSIS — F0151 Vascular dementia with behavioral disturbance: Secondary | ICD-10-CM

## 2016-06-12 DIAGNOSIS — K219 Gastro-esophageal reflux disease without esophagitis: Secondary | ICD-10-CM | POA: Diagnosis not present

## 2016-06-12 DIAGNOSIS — I25119 Atherosclerotic heart disease of native coronary artery with unspecified angina pectoris: Secondary | ICD-10-CM

## 2016-06-12 DIAGNOSIS — M6281 Muscle weakness (generalized): Secondary | ICD-10-CM | POA: Diagnosis not present

## 2016-06-12 DIAGNOSIS — E1169 Type 2 diabetes mellitus with other specified complication: Secondary | ICD-10-CM

## 2016-06-12 DIAGNOSIS — I110 Hypertensive heart disease with heart failure: Secondary | ICD-10-CM

## 2016-06-12 DIAGNOSIS — F0393 Unspecified dementia, unspecified severity, with mood disturbance: Secondary | ICD-10-CM

## 2016-06-12 DIAGNOSIS — F0153 Vascular dementia, unspecified severity, with mood disturbance: Secondary | ICD-10-CM

## 2016-06-12 DIAGNOSIS — I10 Essential (primary) hypertension: Secondary | ICD-10-CM | POA: Diagnosis not present

## 2016-06-12 DIAGNOSIS — N183 Chronic kidney disease, stage 3 (moderate): Secondary | ICD-10-CM | POA: Diagnosis not present

## 2016-06-12 DIAGNOSIS — E785 Hyperlipidemia, unspecified: Secondary | ICD-10-CM

## 2016-06-12 DIAGNOSIS — R2689 Other abnormalities of gait and mobility: Secondary | ICD-10-CM | POA: Diagnosis not present

## 2016-06-12 NOTE — Progress Notes (Signed)
Patient ID: Sheila Flynn, female   DOB: Nov 18, 1945, 70 y.o.   MRN: [id number]   Location:   [place] Room Number: 128-A Place of Service:  SNF (31)   CODE STATUS: Full Code  Allergies  Allergen Reactions  . Ace Inhibitors Swelling    Chief Complaint  Patient presents with  . Medical Management of Chronic Issues    Follow up    HPI:  She is a long term resident of this facility being seen for the management of her chronic illnesses. Overall her status is stable. She tells me that she is feeling good. She does not have any complaints at this time. She does get out of bed on a daily basis. There are no nursing concerns at this time.   Past Medical History:  Diagnosis Date  . Acute respiratory failure ([place]) 12/21/10  . Angina at rest [facility])   . Aspiration pneumonia ([place])   . CAD (coronary artery disease)    Dr. Matthew Saras  . Chronic constipation   . Chronic diastolic CHF (congestive heart failure) ([place])   . CKD (chronic kidney disease)   . COPD (chronic obstructive pulmonary disease) ([place])   . Delirium, induced by drug ([place])    polypharmacy  . Dementia   . DM (diabetes mellitus) type II controlled peripheral vascular disorder ([place])   . 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 Fall    history of falls  . GERD (gastroesophageal reflux disease)   . Hyperlipidemia LDL goal < 100   . Major depressive disorder ([place])   . 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 Migraine    "monthly" (11/06/2014)  . Myocardial infarction ([place])    "I've had about 3" (11/06/2014)  . On home oxygen therapy    "prn at the nursing home" (11/06/2014)  . Pneumonia 12/21/10  . Pulmonary hypertension ([place])   . Rectal cancer ([place])    s/p colostomy  . Sacral fracture, closed ([place])    s/p kyphoplasty  . Stroke [facility]) 2011   postop  . Stroke [facility]) ?2014   "left side sometimes weaker since" (11/06/2014)  .  Thyroid goiter   . Vascular dementia with depressed mood     Past Surgical History:  Procedure Laterality Date  . ABDOMINAL HYSTERECTOMY    . APPENDECTOMY    . COLON SURGERY    . COLOSTOMY     rectal ca  . KYPHOPLASTY     sacrum  . right cataract  11-19-2015  . TONSILLECTOMY    . TUBAL LIGATION      Social History   Social History  . Marital status: Widowed    Spouse name: N/A  . Number of children: N/A  . Years of education: N/A   Occupational History  .  Unemployed    custodial   Social History Main Topics  . Smoking status: Former Smoker    Packs/day: 0.50    Years: 2.00    Types: Cigarettes  . Smokeless tobacco: Never Used     Comment: "quit smoking cigarettes in 1969"  . Alcohol use No  . Drug use:      Comment: previously smoked crack, has had positive UDS for amphetamines and narcotics  . Sexual activity: No   Other Topics Concern  . Not on file   Social History Narrative   Born in [place], has some high school education, retired from custodian job, divorced,  has children   No family history on file.    VITAL SIGNS BP (!) 109/51   Pulse 80   Temp 97.7 F (36.5 C) (Oral)   Resp 18   Ht 5\' 7"  (1.702 m)   Wt 176 lb (79.8 kg)   SpO2 95%   BMI 27.57 kg/m   Patient's Medications  New Prescriptions   No medications on file  Previous Medications   ACETAMINOPHEN (TYLENOL) 325 MG TABLET    Take 650 mg by mouth every 6 (six) hours as needed for mild pain.   AMLODIPINE (NORVASC) 10 MG TABLET    Take 10 mg by mouth daily.   ARTIFICIAL TEAR SOLUTION (TEARS PURE) 0.1-0.3 % SOLN    Apply 1 drop to eye every 8 (eight) hours as needed.   ASPIRIN (ASPIRIN EC) 81 MG EC TABLET    Take 81 mg by mouth daily. Swallow whole.   ATORVASTATIN (LIPITOR) 80 MG TABLET    Take 1 tablet (80 mg total) by mouth daily.   CALCITRIOL (ROCALTROL) 0.25 MCG CAPSULE    Take 0.25 mcg by mouth daily.   CARVEDILOL (COREG) 6.25 MG TABLET    Take 6.25 mg by mouth 2 (two) times  daily with a meal.   CHOLECALCIFEROL 50000 UNITS TABS    Take 50,000 Units by mouth. Take one tablet by mouth one time a day  Starting on the 14 th and ending on the 14 th every month.   DOCUSATE SODIUM (COLACE) 100 MG CAPSULE    Take 100 mg by mouth 3 (three) times daily.   GLIPIZIDE (GLUCOTROL XL) 5 MG 24 HR TABLET    Take 5 mg by mouth daily with breakfast.   HYDRALAZINE (APRESOLINE) 25 MG TABLET    Take 25 mg by mouth 2 (two) times daily.    IPRATROPIUM-ALBUTEROL (DUONEB) 0.5-2.5 (3) MG/3ML SOLN    Take 3 mLs by nebulization every 6 (six) hours as needed (shortness of breathe/wheezing).   ISOSORBIDE MONONITRATE (ISMO,MONOKET) 20 MG TABLET    Take 60 mg by mouth daily.    LOSARTAN (COZAAR) 50 MG TABLET    Take 50 mg by mouth 2 (two) times daily.   NITROGLYCERIN (NITROSTAT) 0.4 MG SL TABLET    Place 0.4 mg under the tongue every 5 (five) minutes as needed for chest pain.   OMEPRAZOLE (PRILOSEC) 20 MG CAPSULE    Take 20 mg by mouth daily.    PAROXETINE (PAXIL) 20 MG TABLET    Take by mouth. Take 1.5 tablet by mouth daily.   POTASSIUM CHLORIDE SA (K-DUR,KLOR-CON) 20 MEQ TABLET    Take 20 mEq by mouth daily.   PREDNISOLONE 5 MG TABS TABLET    Take 5 mg by mouth daily. Reported on 01/23/2016   PROMETHAZINE (PHENERGAN) 25 MG TABLET    Take 25 mg by mouth every 6 (six) hours as needed for nausea or vomiting. Reported on 01/23/2016   PROMETHAZINE (PHENERGAN) 25 MG/ML INJECTION    Inject 25 mg into the vein every 6 (six) hours as needed for nausea or vomiting.   SENNOSIDES-DOCUSATE SODIUM (SENOKOT-S) 8.6-50 MG TABLET    Take 2 tablets by mouth every evening.    TRAZODONE (DESYREL) 50 MG TABLET    Take 50 mg by mouth at bedtime.  Modified Medications   No medications on file  Discontinued Medications   GLIPIZIDE (GLUCOTROL) 5 MG TABLET    Take 0.5 tablets (2.5 mg total) by mouth daily before breakfast.  SIGNIFICANT DIAGNOSTIC EXAMS  11-06-14: chest x-ray: Probable left lower lobe retrocardiac  opacity, atelectasis versuspneumonia. Grossly stable cardiomegaly allowing for differences in technique.   LABS REVIEWED:   07-22-15: chol 179; ldl 104; trig 112; hdl 53; liver normal albumin 4.0 09-04-15: hgb a1c 7.2; urine micro-albumin 11.3 11-22-14: glucose 61; bun 21; creat 1.41; k+ 3.7; na++144; phos 4.7; pth 67; ca++9.2  01-24-16: wbc 4.2; hgb 12.;4 hct 38.4; mcv 80.3 plt 246; glucose 74; bun 12; creat 1.3; k+ 3.3; na++ 143; liver normal albumin 3.6; chol 145; ldl 78; tri 94; hdl 48; tsh 0.54; hb a1c 6.8;  03-13-16: glucose 101; bun 13; creat 1.35; k+ 2.8; na++141 03-16-16: glucose 61; bun 18; creat 1.35; k+ 3.5; na++ 142 03-28-16: urine culture: e-coli: cipro  04-17-16: hgb a1c 6.3       Review of Systems Constitutional: Negative for appetite change and fatigue.  HENT: Negative for congestion.   Respiratory: Negative for cough, chest tightness and shortness of breath.   Cardiovascular: Negative for chest pain, palpitations and leg swelling.  Gastrointestinal: Negative for nausea, abdominal pain, diarrhea and constipation.  Musculoskeletal: Negative for myalgias and arthralgias.  Skin: Negative for pallor.  Neurological: Negative for dizziness.  Psychiatric/Behavioral: The patient is not nervous/anxious.       Physical Exam Constitutional: No distress.  Eyes: Conjunctivae are normal.  Neck: Neck supple. No JVD present. No thyromegaly present.  Cardiovascular: Normal rate, regular rhythm and intact distal pulses.   Respiratory: Effort normal and breath sounds normal. No respiratory distress. She has no wheezes.  GI: Soft. Bowel sounds are normal. She exhibits no distension. There is no tenderness.  Has colostomy   Musculoskeletal: She exhibits no edema.  Able to move all extremities   Lymphadenopathy:    She has no cervical adenopathy.  Neurological: She is alert.  Skin: Skin is warm and dry. She is not diaphoretic.  Psychiatric: She has a normal mood and affect.       ASSESSMENT/ PLAN:  1. Hypertension: will continue norvasc 10 mg daily;  hydralazine 25 mg twice daily  coreg  6.25 mg twice daily cozaar  50 mg twice daily   2. Dyslipidemia: will continue lipitor  80 mg daily  ldl is 78  3. Diabetes: will continue  glipizide xl 5  mg  daily hgb a1c is 6.3  4. COPD: is stable will continue duoneb every 6 hours as needed; will continue prednisone 5 mg daily and will monitor her status.   5. CAD: no complaints of chest pain present will continue imdur 60 mg daily; asa 81 mg daily and prn ntg; will monitor  6. Jerrye Bushy: will continue  prilosec 20 mg daily    7. Constipation: will continue colace three times daily; and senna s 2 tabs daily   8. Depression: will continue paxil 30 mg daily. Take trazodone 50 mg nightly   9.  CVA: is neurologically stable; will continue asa 81 mg daily   10. Vascular dementia: on change in her status; is presently not on medications; will not make changes will monitor her status.   11. Diastolic heart failure: will continue  hydralazine 25 mg twice daily imdur 60 mg daily  Will continue coreg  6.25 mg twice daily and will monitor her status.      Ok Edwards NP [facility]  Contact [phone] Monday through Friday 8am- 5pm  After hours call [phone]

## 2016-06-15 DIAGNOSIS — I5032 Chronic diastolic (congestive) heart failure: Secondary | ICD-10-CM | POA: Diagnosis not present

## 2016-06-15 DIAGNOSIS — I10 Essential (primary) hypertension: Secondary | ICD-10-CM | POA: Diagnosis not present

## 2016-06-15 DIAGNOSIS — N183 Chronic kidney disease, stage 3 (moderate): Secondary | ICD-10-CM | POA: Diagnosis not present

## 2016-06-15 DIAGNOSIS — M6281 Muscle weakness (generalized): Secondary | ICD-10-CM | POA: Diagnosis not present

## 2016-06-15 DIAGNOSIS — R2689 Other abnormalities of gait and mobility: Secondary | ICD-10-CM | POA: Diagnosis not present

## 2016-06-15 DIAGNOSIS — F0280 Dementia in other diseases classified elsewhere without behavioral disturbance: Secondary | ICD-10-CM | POA: Diagnosis not present

## 2016-06-16 DIAGNOSIS — I5032 Chronic diastolic (congestive) heart failure: Secondary | ICD-10-CM | POA: Diagnosis not present

## 2016-06-16 DIAGNOSIS — M6281 Muscle weakness (generalized): Secondary | ICD-10-CM | POA: Diagnosis not present

## 2016-06-16 DIAGNOSIS — N183 Chronic kidney disease, stage 3 (moderate): Secondary | ICD-10-CM | POA: Diagnosis not present

## 2016-06-16 DIAGNOSIS — F0280 Dementia in other diseases classified elsewhere without behavioral disturbance: Secondary | ICD-10-CM | POA: Diagnosis not present

## 2016-06-16 DIAGNOSIS — I10 Essential (primary) hypertension: Secondary | ICD-10-CM | POA: Diagnosis not present

## 2016-06-16 DIAGNOSIS — R2689 Other abnormalities of gait and mobility: Secondary | ICD-10-CM | POA: Diagnosis not present

## 2016-06-17 DIAGNOSIS — N183 Chronic kidney disease, stage 3 (moderate): Secondary | ICD-10-CM | POA: Diagnosis not present

## 2016-06-17 DIAGNOSIS — I5032 Chronic diastolic (congestive) heart failure: Secondary | ICD-10-CM | POA: Diagnosis not present

## 2016-06-17 DIAGNOSIS — I10 Essential (primary) hypertension: Secondary | ICD-10-CM | POA: Diagnosis not present

## 2016-06-17 DIAGNOSIS — F0280 Dementia in other diseases classified elsewhere without behavioral disturbance: Secondary | ICD-10-CM | POA: Diagnosis not present

## 2016-06-17 DIAGNOSIS — R2689 Other abnormalities of gait and mobility: Secondary | ICD-10-CM | POA: Diagnosis not present

## 2016-06-17 DIAGNOSIS — M6281 Muscle weakness (generalized): Secondary | ICD-10-CM | POA: Diagnosis not present

## 2016-06-18 DIAGNOSIS — N183 Chronic kidney disease, stage 3 (moderate): Secondary | ICD-10-CM | POA: Diagnosis not present

## 2016-06-18 DIAGNOSIS — M6281 Muscle weakness (generalized): Secondary | ICD-10-CM | POA: Diagnosis not present

## 2016-06-18 DIAGNOSIS — R2689 Other abnormalities of gait and mobility: Secondary | ICD-10-CM | POA: Diagnosis not present

## 2016-06-18 DIAGNOSIS — I10 Essential (primary) hypertension: Secondary | ICD-10-CM | POA: Diagnosis not present

## 2016-06-18 DIAGNOSIS — I5032 Chronic diastolic (congestive) heart failure: Secondary | ICD-10-CM | POA: Diagnosis not present

## 2016-06-18 DIAGNOSIS — F0280 Dementia in other diseases classified elsewhere without behavioral disturbance: Secondary | ICD-10-CM | POA: Diagnosis not present

## 2016-06-19 DIAGNOSIS — M79672 Pain in left foot: Secondary | ICD-10-CM | POA: Diagnosis not present

## 2016-06-19 DIAGNOSIS — R2689 Other abnormalities of gait and mobility: Secondary | ICD-10-CM | POA: Diagnosis not present

## 2016-06-19 DIAGNOSIS — E119 Type 2 diabetes mellitus without complications: Secondary | ICD-10-CM | POA: Diagnosis not present

## 2016-06-19 DIAGNOSIS — M79671 Pain in right foot: Secondary | ICD-10-CM | POA: Diagnosis not present

## 2016-06-19 DIAGNOSIS — I10 Essential (primary) hypertension: Secondary | ICD-10-CM | POA: Diagnosis not present

## 2016-06-19 DIAGNOSIS — M6281 Muscle weakness (generalized): Secondary | ICD-10-CM | POA: Diagnosis not present

## 2016-06-19 DIAGNOSIS — B351 Tinea unguium: Secondary | ICD-10-CM | POA: Diagnosis not present

## 2016-06-19 DIAGNOSIS — N183 Chronic kidney disease, stage 3 (moderate): Secondary | ICD-10-CM | POA: Diagnosis not present

## 2016-06-19 DIAGNOSIS — I5032 Chronic diastolic (congestive) heart failure: Secondary | ICD-10-CM | POA: Diagnosis not present

## 2016-06-19 DIAGNOSIS — F0280 Dementia in other diseases classified elsewhere without behavioral disturbance: Secondary | ICD-10-CM | POA: Diagnosis not present

## 2016-06-22 DIAGNOSIS — N183 Chronic kidney disease, stage 3 (moderate): Secondary | ICD-10-CM | POA: Diagnosis not present

## 2016-06-22 DIAGNOSIS — R2689 Other abnormalities of gait and mobility: Secondary | ICD-10-CM | POA: Diagnosis not present

## 2016-06-22 DIAGNOSIS — M6281 Muscle weakness (generalized): Secondary | ICD-10-CM | POA: Diagnosis not present

## 2016-06-22 DIAGNOSIS — I10 Essential (primary) hypertension: Secondary | ICD-10-CM | POA: Diagnosis not present

## 2016-06-22 DIAGNOSIS — F0280 Dementia in other diseases classified elsewhere without behavioral disturbance: Secondary | ICD-10-CM | POA: Diagnosis not present

## 2016-06-22 DIAGNOSIS — I5032 Chronic diastolic (congestive) heart failure: Secondary | ICD-10-CM | POA: Diagnosis not present

## 2016-06-23 DIAGNOSIS — F0280 Dementia in other diseases classified elsewhere without behavioral disturbance: Secondary | ICD-10-CM | POA: Diagnosis not present

## 2016-06-23 DIAGNOSIS — I10 Essential (primary) hypertension: Secondary | ICD-10-CM | POA: Diagnosis not present

## 2016-06-23 DIAGNOSIS — M6281 Muscle weakness (generalized): Secondary | ICD-10-CM | POA: Diagnosis not present

## 2016-06-23 DIAGNOSIS — I5032 Chronic diastolic (congestive) heart failure: Secondary | ICD-10-CM | POA: Diagnosis not present

## 2016-06-23 DIAGNOSIS — R2689 Other abnormalities of gait and mobility: Secondary | ICD-10-CM | POA: Diagnosis not present

## 2016-06-23 DIAGNOSIS — N183 Chronic kidney disease, stage 3 (moderate): Secondary | ICD-10-CM | POA: Diagnosis not present

## 2016-07-16 ENCOUNTER — Non-Acute Institutional Stay (SKILLED_NURSING_FACILITY): Payer: Medicare Other | Admitting: Adult Health

## 2016-07-16 ENCOUNTER — Encounter: Payer: Self-pay | Admitting: Adult Health

## 2016-07-16 DIAGNOSIS — F0393 Unspecified dementia, unspecified severity, with mood disturbance: Secondary | ICD-10-CM

## 2016-07-16 DIAGNOSIS — I110 Hypertensive heart disease with heart failure: Secondary | ICD-10-CM

## 2016-07-16 DIAGNOSIS — I5032 Chronic diastolic (congestive) heart failure: Secondary | ICD-10-CM | POA: Diagnosis not present

## 2016-07-16 DIAGNOSIS — F329 Major depressive disorder, single episode, unspecified: Secondary | ICD-10-CM | POA: Diagnosis not present

## 2016-07-16 DIAGNOSIS — E1169 Type 2 diabetes mellitus with other specified complication: Secondary | ICD-10-CM | POA: Diagnosis not present

## 2016-07-16 DIAGNOSIS — F0151 Vascular dementia with behavioral disturbance: Secondary | ICD-10-CM | POA: Diagnosis not present

## 2016-07-16 DIAGNOSIS — F0280 Dementia in other diseases classified elsewhere without behavioral disturbance: Secondary | ICD-10-CM | POA: Diagnosis not present

## 2016-07-16 DIAGNOSIS — J438 Other emphysema: Secondary | ICD-10-CM

## 2016-07-16 DIAGNOSIS — E785 Hyperlipidemia, unspecified: Secondary | ICD-10-CM | POA: Diagnosis not present

## 2016-07-16 DIAGNOSIS — F0153 Vascular dementia, unspecified severity, with mood disturbance: Secondary | ICD-10-CM

## 2016-07-16 DIAGNOSIS — I6350 Cerebral infarction due to unspecified occlusion or stenosis of unspecified cerebral artery: Secondary | ICD-10-CM | POA: Diagnosis not present

## 2016-07-16 DIAGNOSIS — E1151 Type 2 diabetes mellitus with diabetic peripheral angiopathy without gangrene: Secondary | ICD-10-CM

## 2016-07-16 DIAGNOSIS — I25119 Atherosclerotic heart disease of native coronary artery with unspecified angina pectoris: Secondary | ICD-10-CM | POA: Diagnosis not present

## 2016-07-16 NOTE — Progress Notes (Signed)
Patient ID: Sheila Flynn, female   DOB: Apr 03, 1946, 70 y.o.   MRN: [id number]   Location:   [facility] Room Number: 128-A Place of Service:  SNF (31)   CODE STATUS: Full Code  Allergies  Allergen Reactions  . Ace Inhibitors Swelling    Chief Complaint  Patient presents with  . Medical Management of Chronic Issues    Follow up    HPI:  She is a long term resident of this facility being seen for the management of her chronic illnesses. Overall her status is stable. She is out of bed daily and is out and about throughotu the facility. She is not voicing any complaints there are no nursing concerns at this time.    Past Medical History:  Diagnosis Date  . Acute respiratory failure ([place]) 12/21/10  . Angina at rest [facility])   . Aspiration pneumonia ([place])   . CAD (coronary artery disease)    Dr. Matthew Saras  . Chronic constipation   . Chronic diastolic CHF (congestive heart failure) ([place])   . CKD (chronic kidney disease)   . COPD (chronic obstructive pulmonary disease) ([place])   . Delirium, induced by drug ([place])    polypharmacy  . Dementia   . DM (diabetes mellitus) type II controlled peripheral vascular disorder ([place])   . 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 Fall    history of falls  . GERD (gastroesophageal reflux disease)   . Hyperlipidemia LDL goal < 100   . Major depressive disorder ([place])   . 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 Migraine    "monthly" (11/06/2014)  . Myocardial infarction ([place])    "I've had about 3" (11/06/2014)  . On home oxygen therapy    "prn at the nursing home" (11/06/2014)  . Pneumonia 12/21/10  . Pulmonary hypertension ([place])   . Rectal cancer ([place])    s/p colostomy  . Sacral fracture, closed ([place])    s/p kyphoplasty  . Stroke [facility]) 2011   postop  . Stroke [facility]) ?2014   "left side sometimes weaker since" (11/06/2014)  . Thyroid goiter   .  Vascular dementia with depressed mood     Past Surgical History:  Procedure Laterality Date  . ABDOMINAL HYSTERECTOMY    . APPENDECTOMY    . COLON SURGERY    . COLOSTOMY     rectal ca  . KYPHOPLASTY     sacrum  . right cataract  11-19-2015  . TONSILLECTOMY    . TUBAL LIGATION      Social History   Social History  . Marital status: Widowed    Spouse name: N/A  . Number of children: N/A  . Years of education: N/A   Occupational History  .  Unemployed    custodial   Social History Main Topics  . Smoking status: Former Smoker    Packs/day: 0.50    Years: 2.00    Types: Cigarettes  . Smokeless tobacco: Never Used     Comment: "quit smoking cigarettes in 1969"  . Alcohol use No  . Drug use:      Comment: previously smoked crack, has had positive UDS for amphetamines and narcotics  . Sexual activity: No   Other Topics Concern  . 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  No pertinent family history.    VITAL SIGNS BP (!) 142/78   Pulse (!) 59   Temp (!) 96.7 F (35.9 C) (Oral)   Resp 18   Ht 5\' 7"  (1.702 m)   Wt 177 lb 6 oz (80.5 kg)   SpO2 98%   BMI 27.78 kg/m   Patient's Medications  New Prescriptions   No medications on file  Previous Medications   ACETAMINOPHEN (TYLENOL) 325 MG TABLET    Take 650 mg by mouth every 6 (six) hours as needed for mild pain.   AMLODIPINE (NORVASC) 10 MG TABLET    Take 10 mg by mouth daily.   ARTIFICIAL TEAR SOLUTION (TEARS PURE) 0.1-0.3 % SOLN    Apply 1 drop to eye every 8 (eight) hours as needed.   ASPIRIN (ASPIRIN EC) 81 MG EC TABLET    Take 81 mg by mouth daily. Swallow whole.   ATORVASTATIN (LIPITOR) 80 MG TABLET    Take 1 tablet (80 mg total) by mouth daily.   CALCITRIOL (ROCALTROL) 0.25 MCG CAPSULE    Take 0.25 mcg by mouth daily.   CARVEDILOL (COREG) 6.25 MG TABLET    Take 6.25 mg by mouth 2  (two) times daily with a meal.   CHOLECALCIFEROL 50000 UNITS TABS    Take 50,000 Units by mouth. Take one tablet by mouth one time a day  Starting on the 14 th and ending on the 14 th every month.   DOCUSATE SODIUM (COLACE) 100 MG CAPSULE    Take 100 mg by mouth 3 (three) times daily.   HYDRALAZINE (APRESOLINE) 25 MG TABLET    Take 25 mg by mouth 2 (two) times daily.    IPRATROPIUM-ALBUTEROL (DUONEB) 0.5-2.5 (3) MG/3ML SOLN    Take 3 mLs by nebulization every 6 (six) hours as needed (shortness of breathe/wheezing).   ISOSORBIDE MONONITRATE (ISMO,MONOKET) 20 MG TABLET    Take 60 mg by mouth daily.    LOSARTAN (COZAAR) 50 MG TABLET    Take 50 mg by mouth 2 (two) times daily.   NITROGLYCERIN (NITROSTAT) 0.4 MG SL TABLET    Place 0.4 mg under the tongue every 5 (five) minutes as needed for chest pain.   OMEPRAZOLE (PRILOSEC) 20 MG CAPSULE    Take 20 mg by mouth daily.    PAROXETINE (PAXIL) 20 MG TABLET    Take by mouth. Take 1.5 tablet by mouth daily.   POTASSIUM CHLORIDE SA (K-DUR,KLOR-CON) 20 MEQ TABLET    Take 20 mEq by mouth daily.   PREDNISOLONE 5 MG TABS TABLET    Take 5 mg by mouth daily. Reported on 01/23/2016   PROMETHAZINE (PHENERGAN) 25 MG TABLET    Take 25 mg by mouth every 6 (six) hours as needed for nausea or vomiting. Reported on 01/23/2016   PROMETHAZINE (PHENERGAN) 25 MG/ML INJECTION    Inject 25 mg into the vein every 6 (six) hours as needed for nausea or vomiting.   SENNOSIDES-DOCUSATE SODIUM (SENOKOT-S) 8.6-50 MG TABLET    Take 2 tablets by mouth every evening.    TRAZODONE (DESYREL) 50 MG TABLET    Take 50 mg by mouth at bedtime.  Modified Medications   No medications on file  Discontinued Medications   GLIPIZIDE (GLUCOTROL XL) 5 MG 24 HR TABLET    Take 5 mg by mouth daily with breakfast.     SIGNIFICANT DIAGNOSTIC EXAMS  11-06-14: chest x-ray: Probable left lower lobe retrocardiac opacity, atelectasis versuspneumonia. Grossly stable cardiomegaly allowing for differences in  technique.   LABS REVIEWED:   07-22-15: chol 179; ldl 104; trig 112; hdl 53; liver normal albumin 4.0 09-04-15: hgb a1c 7.2; urine micro-albumin 11.3 11-22-14: glucose 61; bun 21; creat 1.41; k+ 3.7; na++144; phos 4.7; pth 67; ca++9.2  01-24-16: wbc 4.2; hgb 12.;4 hct 38.4; mcv 80.3 plt 246; glucose 74; bun 12; creat 1.3; k+ 3.3; na++ 143; liver normal albumin 3.6; chol 145; ldl 78; tri 94; hdl 48; tsh 0.54; hb a1c 6.8;  03-13-16: glucose 101; bun 13; creat 1.35; k+ 2.8; na++141 03-16-16: glucose 61; bun 18; creat 1.35; k+ 3.5; na++ 142 03-28-16: urine culture: e-coli: cipro  04-17-16: hgb a1c 6.3       Review of Systems Constitutional: Negative for appetite change and fatigue.  HENT: Negative for congestion.   Respiratory: Negative for cough, chest tightness and shortness of breath.   Cardiovascular: Negative for chest pain, palpitations and leg swelling.  Gastrointestinal: Negative for nausea, abdominal pain, diarrhea and constipation.  Musculoskeletal: Negative for myalgias and arthralgias.  Skin: Negative for pallor.  Neurological: Negative for dizziness.  Psychiatric/Behavioral: The patient is not nervous/anxious.       Physical Exam Constitutional: No distress.  Eyes: Conjunctivae are normal.  Neck: Neck supple. No JVD present. No thyromegaly present.  Cardiovascular: Normal rate, regular rhythm and intact distal pulses.   Respiratory: Effort normal and breath sounds normal. No respiratory distress. She has no wheezes.  GI: Soft. Bowel sounds are normal. She exhibits no distension. There is no tenderness.  Has colostomy   Musculoskeletal: She exhibits no edema.  Able to move all extremities   Lymphadenopathy:    She has no cervical adenopathy.  Neurological: She is alert.  Skin: Skin is warm and dry. She is not diaphoretic.  Psychiatric: She has a normal mood and affect.      ASSESSMENT/ PLAN:  1. Hypertension: will continue norvasc 10 mg daily;  hydralazine 25 mg  twice daily  coreg  6.25 mg twice daily cozaar  50 mg twice daily   2. Dyslipidemia: will continue lipitor  80 mg daily  ldl is 78  3. Diabetes: her glipizide was stopped due to low cbg readings in the am;  hgb a1c is 6.3  4. COPD: is stable will continue duoneb every 6 hours as needed; will continue prednisone 5 mg daily and will monitor her status.   5. CAD: history of MI X3;  no complaints of chest pain present will continue imdur 60 mg daily; asa 81 mg daily and prn ntg; will monitor  6. Jerrye Bushy: will continue  prilosec 20 mg daily    7. Constipation: will continue colace three times daily; and senna s 2 tabs daily   8. Depression: will continue paxil 30 mg daily. Take trazodone 50 mg nightly   9.  CVA: is neurologically stable; will continue asa 81 mg daily   10. Vascular dementia: on change in her status; is presently not on medications; will not make changes will monitor her status.   11. Diastolic heart failure: will continue  hydralazine 25 mg twice daily imdur 60 mg daily  Will continue coreg  6.25 mg twice daily and will monitor her status.    In Oct will check cbc; cmp; lipids; hgb a1c  Urine for micro-albumin   Ok Edwards NP [facility]  Contact [phone] Monday through Friday 8am- 5pm  After hours call [phone]

## 2016-08-10 DIAGNOSIS — N189 Chronic kidney disease, unspecified: Secondary | ICD-10-CM | POA: Diagnosis not present

## 2016-08-10 DIAGNOSIS — E119 Type 2 diabetes mellitus without complications: Secondary | ICD-10-CM | POA: Diagnosis not present

## 2016-08-13 ENCOUNTER — Encounter: Payer: Self-pay | Admitting: Adult Health

## 2016-08-13 ENCOUNTER — Non-Acute Institutional Stay (SKILLED_NURSING_FACILITY): Payer: Medicare Other | Admitting: Adult Health

## 2016-08-13 DIAGNOSIS — K219 Gastro-esophageal reflux disease without esophagitis: Secondary | ICD-10-CM

## 2016-08-13 DIAGNOSIS — F329 Major depressive disorder, single episode, unspecified: Secondary | ICD-10-CM

## 2016-08-13 DIAGNOSIS — J438 Other emphysema: Secondary | ICD-10-CM

## 2016-08-13 DIAGNOSIS — F0393 Unspecified dementia, unspecified severity, with mood disturbance: Secondary | ICD-10-CM

## 2016-08-13 DIAGNOSIS — F0280 Dementia in other diseases classified elsewhere without behavioral disturbance: Secondary | ICD-10-CM

## 2016-08-13 DIAGNOSIS — I209 Angina pectoris, unspecified: Secondary | ICD-10-CM

## 2016-08-13 DIAGNOSIS — F0151 Vascular dementia with behavioral disturbance: Secondary | ICD-10-CM

## 2016-08-13 DIAGNOSIS — I5032 Chronic diastolic (congestive) heart failure: Secondary | ICD-10-CM

## 2016-08-13 DIAGNOSIS — I25119 Atherosclerotic heart disease of native coronary artery with unspecified angina pectoris: Secondary | ICD-10-CM

## 2016-08-13 DIAGNOSIS — I6350 Cerebral infarction due to unspecified occlusion or stenosis of unspecified cerebral artery: Secondary | ICD-10-CM

## 2016-08-13 DIAGNOSIS — I110 Hypertensive heart disease with heart failure: Secondary | ICD-10-CM | POA: Diagnosis not present

## 2016-08-13 DIAGNOSIS — E785 Hyperlipidemia, unspecified: Secondary | ICD-10-CM

## 2016-08-13 DIAGNOSIS — E1151 Type 2 diabetes mellitus with diabetic peripheral angiopathy without gangrene: Secondary | ICD-10-CM

## 2016-08-13 DIAGNOSIS — F0153 Vascular dementia, unspecified severity, with mood disturbance: Secondary | ICD-10-CM

## 2016-08-13 DIAGNOSIS — E1169 Type 2 diabetes mellitus with other specified complication: Secondary | ICD-10-CM

## 2016-08-13 NOTE — Progress Notes (Signed)
Patient ID: Sheila Flynn, female   DOB: 1945/11/30, 70 y.o.   MRN: [id number]   Location:   [facility] Room Number: 128-A Place of Service:  SNF (31)   CODE STATUS: Full Code  Allergies  Allergen Reactions  . Ace Inhibitors Swelling    Chief Complaint  Patient presents with  . Medical Management of Chronic Issues    Follow up    HPI:  She is a long term resident of this facility being seen for the management of her chronic illnesses. Overall her status is stable. She tells me that she is feeling good. There are no nursing concerns at this time.    Past Medical History:  Diagnosis Date  . Acute respiratory failure ([place]) 12/21/10  . Angina at rest [facility])   . Aspiration pneumonia ([place])   . CAD (coronary artery disease)    Dr. Matthew Saras  . Chronic constipation   . Chronic diastolic CHF (congestive heart failure) ([place])   . CKD (chronic kidney disease)   . COPD (chronic obstructive pulmonary disease) ([place])   . Delirium, induced by drug ([place])    polypharmacy  . Dementia   . DM (diabetes mellitus) type II controlled peripheral vascular disorder ([place])   . 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 Fall    history of falls  . GERD (gastroesophageal reflux disease)   . Hyperlipidemia LDL goal < 100   . Major depressive disorder   . 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 Migraine    "monthly" (11/06/2014)  . Myocardial infarction    "I've had about 3" (11/06/2014)  . On home oxygen therapy    "prn at the nursing home" (11/06/2014)  . Pneumonia 12/21/10  . Pulmonary hypertension   . Rectal cancer ([place])    s/p colostomy  . Sacral fracture, closed ([place])    s/p kyphoplasty  . Stroke [facility]) 2011   postop  . Stroke [facility]) ?2014   "left side sometimes weaker since" (11/06/2014)  . Thyroid goiter   . Vascular dementia with depressed mood     Past Surgical History:  Procedure  Laterality Date  . ABDOMINAL HYSTERECTOMY    . APPENDECTOMY    . COLON SURGERY    . COLOSTOMY     rectal ca  . KYPHOPLASTY     sacrum  . right cataract  11-19-2015  . TONSILLECTOMY    . TUBAL LIGATION      Social History   Social History  . Marital status: Widowed    Spouse name: N/A  . Number of children: N/A  . Years of education: N/A   Occupational History  .  Unemployed    custodial   Social History Main Topics  . Smoking status: Former Smoker    Packs/day: 0.50    Years: 2.00    Types: Cigarettes  . Smokeless tobacco: Never Used     Comment: "quit smoking cigarettes in 1969"  . Alcohol use No  . Drug use:      Comment: previously smoked crack, has had positive UDS for amphetamines and narcotics  . Sexual activity: No   Other Topics Concern  . 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 No pertinent family history.    VITAL SIGNS BP 138/72   Pulse 68  Temp 98 F (36.7 C) (Oral)   Resp 18   Ht 5\' 7"  (1.702 m)   Wt 174 lb 8 oz (79.2 kg)   SpO2 97%   BMI 27.33 kg/m   Patient's Medications  New Prescriptions   No medications on file  Previous Medications   ACETAMINOPHEN (TYLENOL) 325 MG TABLET    Take 650 mg by mouth every 6 (six) hours as needed for mild pain.   AMLODIPINE (NORVASC) 10 MG TABLET    Take 10 mg by mouth daily.   ARTIFICIAL TEAR SOLUTION (TEARS PURE) 0.1-0.3 % SOLN    Apply 1 drop to eye every 8 (eight) hours as needed.   ASPIRIN (ASPIRIN EC) 81 MG EC TABLET    Take 81 mg by mouth daily. Swallow whole.   ATORVASTATIN (LIPITOR) 80 MG TABLET    Take 1 tablet (80 mg total) by mouth daily.   CALCITRIOL (ROCALTROL) 0.25 MCG CAPSULE    Take 0.25 mcg by mouth daily.   CARVEDILOL (COREG) 6.25 MG TABLET    Take 6.25 mg by mouth 2 (two) times daily with a meal.   CHOLECALCIFEROL 50000 UNITS TABS    Take 50,000 Units by  mouth. Take one tablet by mouth one time a day  Starting on the 14 th and ending on the 14 th every month.   DOCUSATE SODIUM (COLACE) 100 MG CAPSULE    Take 100 mg by mouth 3 (three) times daily.   HYDRALAZINE (APRESOLINE) 25 MG TABLET    Take 25 mg by mouth 2 (two) times daily.    ISOSORBIDE MONONITRATE (ISMO,MONOKET) 20 MG TABLET    Take 60 mg by mouth daily.    LOSARTAN (COZAAR) 50 MG TABLET    Take 50 mg by mouth 2 (two) times daily.   NITROGLYCERIN (NITROSTAT) 0.4 MG SL TABLET    Place 0.4 mg under the tongue every 5 (five) minutes as needed for chest pain.   OMEPRAZOLE (PRILOSEC) 20 MG CAPSULE    Take 20 mg by mouth daily.    PAROXETINE (PAXIL) 20 MG TABLET    Take by mouth. Take 1.5 tablet by mouth daily.   POTASSIUM CHLORIDE SA (K-DUR,KLOR-CON) 20 MEQ TABLET    Take 20 mEq by mouth daily.   PREDNISOLONE 5 MG TABS TABLET    Take 5 mg by mouth daily. Reported on 01/23/2016   SENNOSIDES-DOCUSATE SODIUM (SENOKOT-S) 8.6-50 MG TABLET    Take 2 tablets by mouth every evening.    TRAZODONE (DESYREL) 50 MG TABLET    Take 50 mg by mouth at bedtime.  Modified Medications   No medications on file  Discontinued Medications   IPRATROPIUM-ALBUTEROL (DUONEB) 0.5-2.5 (3) MG/3ML SOLN    Take 3 mLs by nebulization every 6 (six) hours as needed (shortness of breathe/wheezing).   PROMETHAZINE (PHENERGAN) 25 MG TABLET    Take 25 mg by mouth every 6 (six) hours as needed for nausea or vomiting. Reported on 01/23/2016   PROMETHAZINE (PHENERGAN) 25 MG/ML INJECTION    Inject 25 mg into the vein every 6 (six) hours as needed for nausea or vomiting.     SIGNIFICANT DIAGNOSTIC EXAMS  11-06-14: chest x-ray: Probable left lower lobe retrocardiac opacity, atelectasis versuspneumonia. Grossly stable cardiomegaly allowing for differences in technique.   LABS REVIEWED:   09-04-15: hgb a1c 7.2; urine micro-albumin 11.3 11-22-14: glucose 61; bun 21; creat 1.41; k+ 3.7; na++144; phos 4.7; pth 67; ca++9.2  01-24-16: wbc  4.2; hgb 12.;4 hct 38.4; mcv 80.3 plt  246; glucose 74; bun 12; creat 1.3; k+ 3.3; na++ 143; liver normal albumin 3.6; chol 145; ldl 78; tri 94; hdl 48; tsh 0.54; hb a1c 6.8;  03-13-16: glucose 101; bun 13; creat 1.35; k+ 2.8; na++141 03-16-16: glucose 61; bun 18; creat 1.35; k+ 3.5; na++ 142 03-28-16: urine culture: e-coli: cipro  04-17-16: hgb a1c 6.3       Review of Systems Constitutional: Negative for appetite change and fatigue.  HENT: Negative for congestion.   Respiratory: Negative for cough, chest tightness and shortness of breath.   Cardiovascular: Negative for chest pain, palpitations and leg swelling.  Gastrointestinal: Negative for nausea, abdominal pain, diarrhea and constipation.  Musculoskeletal: Negative for myalgias and arthralgias.  Skin: Negative for pallor.  Neurological: Negative for dizziness.  Psychiatric/Behavioral: The patient is not nervous/anxious.       Physical Exam Constitutional: No distress.  Eyes: Conjunctivae are normal.  Neck: Neck supple. No JVD present. No thyromegaly present.  Cardiovascular: Normal rate, regular rhythm and intact distal pulses.   Respiratory: Effort normal and breath sounds normal. No respiratory distress. She has no wheezes.  GI: Soft. Bowel sounds are normal. She exhibits no distension. There is no tenderness.  Has colostomy   Musculoskeletal: She exhibits no edema.  Able to move all extremities   Lymphadenopathy:    She has no cervical adenopathy.  Neurological: She is alert.  Skin: Skin is warm and dry. She is not diaphoretic.  Psychiatric: She has a normal mood and affect.      ASSESSMENT/ PLAN:  1. Hypertension: will continue norvasc 10 mg daily;  hydralazine 25 mg twice daily  coreg  6.25 mg twice daily cozaar  50 mg twice daily   2. Dyslipidemia: will continue lipitor  80 mg daily  ldl is 78  3. Diabetes: her glipizide was stopped due to low cbg readings in the am;  hgb a1c is 6.3  4. COPD: is stable  will  continue prednisone 5 mg daily and will monitor her status.   5. CAD: history of MI X3;  no complaints of chest pain present will continue imdur 60 mg daily; asa 81 mg daily and prn ntg; will monitor  6. Jerrye Bushy: will continue  prilosec 20 mg daily    7. Constipation: will continue colace three times daily; and senna s 2 tabs daily   8. Depression: will continue paxil 30 mg daily. Take trazodone 50 mg nightly   9.  CVA: is neurologically stable; will continue asa 81 mg daily   10. Vascular dementia: on change in her status; is presently not on medications; will not make changes will monitor her status.   11. Diastolic heart failure: will continue  hydralazine 25 mg twice daily imdur 60 mg daily  Will continue coreg  6.25 mg twice daily and will monitor her status.     Will check cbc; cmp; lipids hgb a1c urine micro albumin     Ok Edwards NP [facility]  Contact [phone] Monday through Friday 8am- 5pm  After hours call [phone]

## 2016-08-14 DIAGNOSIS — N189 Chronic kidney disease, unspecified: Secondary | ICD-10-CM | POA: Diagnosis not present

## 2016-08-14 DIAGNOSIS — D649 Anemia, unspecified: Secondary | ICD-10-CM | POA: Diagnosis not present

## 2016-08-14 DIAGNOSIS — E089 Diabetes mellitus due to underlying condition without complications: Secondary | ICD-10-CM | POA: Diagnosis not present

## 2016-08-14 LAB — BASIC METABOLIC PANEL
BUN: 20 mg/dL (ref 4–21)
CREATININE: 1.5 mg/dL — AB (ref 0.5–1.1)
GLUCOSE: 107 mg/dL
POTASSIUM: 4.1 mmol/L (ref 3.4–5.3)
SODIUM: 140 mmol/L (ref 137–147)

## 2016-08-14 LAB — HEPATIC FUNCTION PANEL
ALT: 11 U/L (ref 7–35)
AST: 14 U/L (ref 13–35)
Alkaline Phosphatase: 68 U/L (ref 25–125)
Bilirubin, Total: 0.4 mg/dL

## 2016-08-14 LAB — HEMOGLOBIN A1C: Hemoglobin A1C: 6.7

## 2016-08-14 LAB — MICROALBUMIN, URINE: Microalb, Ur: 1.3

## 2016-08-14 LAB — LIPID PANEL
Cholesterol: 153 mg/dL (ref 0–200)
HDL: 39 mg/dL (ref 35–70)
LDL CALC: 87 mg/dL
Triglycerides: 135 mg/dL (ref 40–160)

## 2016-08-14 LAB — CBC AND DIFFERENTIAL
HCT: 37 % (ref 36–46)
HEMOGLOBIN: 12.1 g/dL (ref 12.0–16.0)
Platelets: 229 10*3/uL (ref 150–399)
WBC: 5.7 10*3/mL

## 2016-08-25 ENCOUNTER — Encounter: Payer: Self-pay | Admitting: Internal Medicine

## 2016-08-25 ENCOUNTER — Non-Acute Institutional Stay (SKILLED_NURSING_FACILITY): Payer: Medicare Other | Admitting: Internal Medicine

## 2016-08-25 DIAGNOSIS — Z8673 Personal history of transient ischemic attack (TIA), and cerebral infarction without residual deficits: Secondary | ICD-10-CM | POA: Insufficient documentation

## 2016-08-25 DIAGNOSIS — J302 Other seasonal allergic rhinitis: Secondary | ICD-10-CM | POA: Diagnosis not present

## 2016-08-25 DIAGNOSIS — F0151 Vascular dementia with behavioral disturbance: Secondary | ICD-10-CM

## 2016-08-25 DIAGNOSIS — R51 Headache: Secondary | ICD-10-CM

## 2016-08-25 DIAGNOSIS — F0153 Vascular dementia, unspecified severity, with mood disturbance: Secondary | ICD-10-CM

## 2016-08-25 DIAGNOSIS — F329 Major depressive disorder, single episode, unspecified: Secondary | ICD-10-CM

## 2016-08-25 DIAGNOSIS — R519 Headache, unspecified: Secondary | ICD-10-CM

## 2016-08-25 NOTE — Progress Notes (Signed)
Patient ID: Sheila Flynn, female   DOB: 10/09/1946, 70 y.o.   MRN: [id number]    DATE:  08/25/2016  Location:    [place] Room Number: 128 A Place of Service: SNF (31)   Extended Emergency Contact Information Primary Emergency Contact: Lindsay,Teresa Address: [address]          [place] Phone: [phone] Relation: Daughter Secondary Emergency Contact: Carlyle Basques Address: [address] [place], [place] [address] [place] Mobile Phone: [phone] Relation: Grandaughter  Advanced Directive information Does patient have an advance directive?: No, Would patient like information on creating an advanced directive?: No - patient declined information  Chief Complaint  Patient presents with  . Acute Visit    Headache    HPI:  70 yo female long term resident with hx CVA seen today for HA x 4 weeks. She reports blurry vision x 3 weeks. Attempted to see eye specialist but states she was not seen as "no one was with me".  She states pain in head radiates to right occiput. No new gait disturbances. No dizziness, sinus pressure, ear pain/pressure, nasal d/c, cough, CP/SOB. No sore throat, f/c. Pt is a poor historian due to dementia. Hx obtained from chart  Hypertension - stable on norvasc 10 mg daily;  hydralazine 25 mg twice daily; coreg  6.25 mg twice daily; cozaar  50 mg twice daily   Diabetes - glipizide was stopped due to low cbg readings in the am;  hgb a1c is 6.7%  COPD - stable on duoneb every 6 hours as needed;  prednisone 5 mg daily    CAD with history of MI X 3 -   no complaints of chest pain present; takes imdur 60 mg daily; asa 81 mg daily and prn ntg. Also on lipitor  Depression - stable on paxil 30 mg daily; trazodone 50 mg nightly   Hx CVA  - takes asa 81 mg daily   Vascular dementia- stable. presently not on medications;  Diastolic heart failure - stable on   hydralazine 25 mg twice daily; imdur 60 mg daily; coreg  6.25 mg twice daily   Past Medical History:  Diagnosis Date  . Acute respiratory failure ([place]) 12/21/10  . Angina at rest [facility])   . Aspiration pneumonia ([place])   . CAD (coronary artery disease)    Dr. Matthew Saras  . Chronic constipation   . Chronic diastolic CHF (congestive heart failure) ([place])   . CKD (chronic kidney disease)   . COPD (chronic obstructive pulmonary disease) ([place])   . Delirium, induced by drug ([place])    polypharmacy  . Dementia   . DM (diabetes mellitus) type II controlled peripheral vascular disorder ([place])   . 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 Fall    history of falls  . GERD (gastroesophageal reflux disease)   . Hyperlipidemia LDL goal < 100   . Major depressive disorder   . 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 Migraine    "monthly" (11/06/2014)  . Myocardial infarction    "I've had about 3" (11/06/2014)  . On home oxygen therapy    "prn at the nursing home" (11/06/2014)  . Pneumonia 12/21/10  . Pulmonary hypertension   . Rectal cancer ([place])    s/p colostomy  . Sacral fracture, closed ([place])  s/p kyphoplasty  . Stroke [facility]) 2011   postop  . Stroke [facility]) ?2014   "left side sometimes weaker since" (11/06/2014)  . Thyroid goiter   . Vascular dementia with depressed mood     Past Surgical History:  Procedure Laterality Date  . ABDOMINAL HYSTERECTOMY    . APPENDECTOMY    . COLON SURGERY    . COLOSTOMY     rectal ca  . KYPHOPLASTY     sacrum  . right cataract  11-19-2015  . TONSILLECTOMY    . TUBAL LIGATION      Patient Care Team: Gildardo Cranker, DO as PCP - General (Internal Medicine) Gerlene Fee, NP as Nurse Practitioner (Nurse Practitioner) Tarri Abernethy ([place])  Social History   Social History  . Marital status: Widowed    Spouse name: N/A  . Number of children:  N/A  . Years of education: N/A   Occupational History  .  Unemployed    custodial   Social History Main Topics  . Smoking status: Former Smoker    Packs/day: 0.50    Years: 2.00    Types: Cigarettes  . Smokeless tobacco: Never Used     Comment: "quit smoking cigarettes in 1969"  . Alcohol use No  . Drug use:      Comment: previously smoked crack, has had positive UDS for amphetamines and narcotics  . Sexual activity: No   Other Topics Concern  . 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 Her smoking use included Cigarettes. She has a 1.00 pack-year smoking history. She has never used smokeless tobacco. She reports that she uses drugs. She reports that she does not drink alcohol.  History reviewed. No pertinent family history. Family Status  Relation Status  . Mother Deceased  . Father Deceased  . Sister Deceased  . Brother Alive  . Brother Alive  . Daughter Alive  . Daughter Alive  . Son Alive    Immunization History  Administered Date(s) Administered  . Influenza-Unspecified 08/09/2015    Allergies  Allergen Reactions  . Ace Inhibitors Swelling    Medications: Patient's Medications  New Prescriptions   No medications on file  Previous Medications   ACETAMINOPHEN (TYLENOL) 325 MG TABLET    Take 650 mg by mouth every 6 (six) hours as needed for mild pain.   AMLODIPINE (NORVASC) 10 MG TABLET    Take 10 mg by mouth daily.   ARTIFICIAL TEAR SOLUTION (TEARS PURE) 0.1-0.3 % SOLN    Apply 1 drop to eye every 8 (eight) hours as needed.   ASPIRIN (ASPIRIN EC) 81 MG EC TABLET    Take 81 mg by mouth daily. Swallow whole.   ATORVASTATIN (LIPITOR) 80 MG TABLET    Take 1 tablet (80 mg total) by mouth daily.   CALCITRIOL (ROCALTROL) 0.25 MCG CAPSULE    Take 0.25 mcg by mouth daily.   CARVEDILOL (COREG) 6.25 MG TABLET    Take 6.25  mg by mouth 2 (two) times daily with a meal.   CHOLECALCIFEROL 50000 UNITS TABS    Take 50,000 Units by mouth. Take one tablet by mouth one time a day  Starting on the 14 th and ending on the 14 th every month.   DOCUSATE SODIUM (COLACE) 100 MG CAPSULE    Take 100 mg  by mouth 3 (three) times daily.   HYDRALAZINE (APRESOLINE) 25 MG TABLET    Take 25 mg by mouth 2 (two) times daily.    ISOSORBIDE MONONITRATE (ISMO,MONOKET) 20 MG TABLET    Take 60 mg by mouth daily.    LOSARTAN (COZAAR) 50 MG TABLET    Take 50 mg by mouth 2 (two) times daily.   NITROGLYCERIN (NITROSTAT) 0.4 MG SL TABLET    Place 0.4 mg under the tongue every 5 (five) minutes as needed for chest pain.   OMEPRAZOLE (PRILOSEC) 20 MG CAPSULE    Take 20 mg by mouth daily.    PAROXETINE (PAXIL) 20 MG TABLET    Take by mouth. Take 1.5 tablet by mouth daily.   POTASSIUM CHLORIDE SA (K-DUR,KLOR-CON) 20 MEQ TABLET    Take 20 mEq by mouth daily.   PREDNISOLONE 5 MG TABS TABLET    Take 5 mg by mouth daily. Reported on 01/23/2016   SENNOSIDES-DOCUSATE SODIUM (SENOKOT-S) 8.6-50 MG TABLET    Take 2 tablets by mouth every evening.    TRAZODONE (DESYREL) 50 MG TABLET    Take 50 mg by mouth at bedtime.  Modified Medications   No medications on file  Discontinued Medications   No medications on file    Review of Systems  Unable to perform ROS: Dementia    Vitals:   08/25/16 1416  BP: (!) 144/72  Pulse: 64  Resp: 18  Temp: 98 F (36.7 C)  TempSrc: Oral  SpO2: 97%  Weight: 174 lb 12.8 oz (79.3 kg)  Height: 5\' 7"  (1.702 m)   Body mass index is 27.38 kg/m.  Physical Exam  Constitutional: She appears well-developed.  Frail appearing lying in bed in NAD  HENT:  Head: Normocephalic and atraumatic.  Mouth/Throat: Oropharynx is clear and moist. No oropharyngeal exudate.  Right maxillary sinus TTP with boggy tissue texture changes. MMM. No tongue lesions  Eyes: EOM are normal. Pupils are equal, round, and reactive to light. Right eye  exhibits no discharge. Left eye exhibits no discharge. No scleral icterus.  Neck: Neck supple.  Cardiovascular: Normal rate, regular rhythm and intact distal pulses.  Exam reveals no gallop and no friction rub.   Murmur (1/6 SEM) heard. No LE edema b/l. No calf TTP  Pulmonary/Chest: No respiratory distress. She has no wheezes. She has no rales. She exhibits no tenderness.  Musculoskeletal: She exhibits edema.  Lymphadenopathy:    She has no cervical adenopathy.  Neurological: She is alert.  Right reduced grip strength  Skin: Skin is warm and dry. No rash noted.  Psychiatric: She has a normal mood and affect. Her behavior is normal.     Labs reviewed: Nursing Home on 08/25/2016  Component Date Value Ref Range Status  . Triglycerides 08/14/2016 135  40 - 160 mg/dL Final  . Cholesterol 08/14/2016 153  0 - 200 mg/dL Final  . HDL 08/14/2016 39  35 - 70 mg/dL Final  . LDL Cholesterol 08/14/2016 87  mg/dL Final  . Alkaline Phosphatase 08/14/2016 68  25 - 125 U/L Final  . ALT 08/14/2016 11  7 - 35 U/L Final  . AST 08/14/2016 14  13 - 35 U/L Final  . Bilirubin, Total 08/14/2016 0.4  mg/dL Final  . Hemoglobin A1C 08/14/2016 6.7   Final    No results found.   Assessment/Plan   ICD-9-CM ICD-10-CM   1. Nonintractable headache, unspecified chronicity pattern, unspecified headache type 784.0 R51    r/o stroke  2. Seasonal  allergic rhinitis, unspecified chronicity, unspecified trigger 477.9 J30.2   3. Vascular dementia with depressed mood 290.43 F01.51     F32.9   4. History of stroke V12.54 Z86.73     Check CT head without contrast to r/o stroke  Start zyrtec 10mg  po daily for seasonal allergy  Cont other meds as ordered  Will follow  Synthia Fairbank S. Perlie Gold  [facility] [address] [place], [place] [address] [phone] Cell (Monday-Friday 8 AM - 5 PM) [phone] After 5 PM and follow prompts

## 2016-09-10 DIAGNOSIS — Z23 Encounter for immunization: Secondary | ICD-10-CM | POA: Diagnosis not present

## 2016-09-11 DIAGNOSIS — M6281 Muscle weakness (generalized): Secondary | ICD-10-CM | POA: Diagnosis not present

## 2016-09-11 DIAGNOSIS — I5032 Chronic diastolic (congestive) heart failure: Secondary | ICD-10-CM | POA: Diagnosis not present

## 2016-09-11 DIAGNOSIS — I6789 Other cerebrovascular disease: Secondary | ICD-10-CM | POA: Diagnosis not present

## 2016-09-11 DIAGNOSIS — N390 Urinary tract infection, site not specified: Secondary | ICD-10-CM | POA: Diagnosis not present

## 2016-09-11 DIAGNOSIS — J449 Chronic obstructive pulmonary disease, unspecified: Secondary | ICD-10-CM | POA: Diagnosis not present

## 2016-09-11 DIAGNOSIS — I10 Essential (primary) hypertension: Secondary | ICD-10-CM | POA: Diagnosis not present

## 2016-09-11 DIAGNOSIS — F0280 Dementia in other diseases classified elsewhere without behavioral disturbance: Secondary | ICD-10-CM | POA: Diagnosis not present

## 2016-09-11 DIAGNOSIS — K219 Gastro-esophageal reflux disease without esophagitis: Secondary | ICD-10-CM | POA: Diagnosis not present

## 2016-09-11 DIAGNOSIS — R2689 Other abnormalities of gait and mobility: Secondary | ICD-10-CM | POA: Diagnosis not present

## 2016-09-11 DIAGNOSIS — F329 Major depressive disorder, single episode, unspecified: Secondary | ICD-10-CM | POA: Diagnosis not present

## 2016-09-11 DIAGNOSIS — E785 Hyperlipidemia, unspecified: Secondary | ICD-10-CM | POA: Diagnosis not present

## 2016-09-11 DIAGNOSIS — N183 Chronic kidney disease, stage 3 (moderate): Secondary | ICD-10-CM | POA: Diagnosis not present

## 2016-09-11 DIAGNOSIS — E119 Type 2 diabetes mellitus without complications: Secondary | ICD-10-CM | POA: Diagnosis not present

## 2016-09-14 ENCOUNTER — Encounter: Payer: Self-pay | Admitting: Adult Health

## 2016-09-14 ENCOUNTER — Non-Acute Institutional Stay (SKILLED_NURSING_FACILITY): Payer: Medicare Other | Admitting: Adult Health

## 2016-09-14 DIAGNOSIS — J438 Other emphysema: Secondary | ICD-10-CM

## 2016-09-14 DIAGNOSIS — I10 Essential (primary) hypertension: Secondary | ICD-10-CM | POA: Diagnosis not present

## 2016-09-14 DIAGNOSIS — E785 Hyperlipidemia, unspecified: Secondary | ICD-10-CM

## 2016-09-14 DIAGNOSIS — R2689 Other abnormalities of gait and mobility: Secondary | ICD-10-CM | POA: Diagnosis not present

## 2016-09-14 DIAGNOSIS — N183 Chronic kidney disease, stage 3 (moderate): Secondary | ICD-10-CM | POA: Diagnosis not present

## 2016-09-14 DIAGNOSIS — E1169 Type 2 diabetes mellitus with other specified complication: Secondary | ICD-10-CM

## 2016-09-14 DIAGNOSIS — F0151 Vascular dementia with behavioral disturbance: Secondary | ICD-10-CM

## 2016-09-14 DIAGNOSIS — E1151 Type 2 diabetes mellitus with diabetic peripheral angiopathy without gangrene: Secondary | ICD-10-CM

## 2016-09-14 DIAGNOSIS — I110 Hypertensive heart disease with heart failure: Secondary | ICD-10-CM

## 2016-09-14 DIAGNOSIS — F329 Major depressive disorder, single episode, unspecified: Secondary | ICD-10-CM | POA: Diagnosis not present

## 2016-09-14 DIAGNOSIS — I5032 Chronic diastolic (congestive) heart failure: Secondary | ICD-10-CM | POA: Diagnosis not present

## 2016-09-14 DIAGNOSIS — F0153 Vascular dementia, unspecified severity, with mood disturbance: Secondary | ICD-10-CM

## 2016-09-14 DIAGNOSIS — I209 Angina pectoris, unspecified: Secondary | ICD-10-CM

## 2016-09-14 DIAGNOSIS — I25119 Atherosclerotic heart disease of native coronary artery with unspecified angina pectoris: Secondary | ICD-10-CM

## 2016-09-14 DIAGNOSIS — F0280 Dementia in other diseases classified elsewhere without behavioral disturbance: Secondary | ICD-10-CM | POA: Diagnosis not present

## 2016-09-14 DIAGNOSIS — M6281 Muscle weakness (generalized): Secondary | ICD-10-CM | POA: Diagnosis not present

## 2016-09-14 NOTE — Progress Notes (Signed)
Patient ID: Sheila Flynn, female   DOB: 09-27-1946, 70 y.o.   MRN: [id number]   Location:   [place] Room Number: 128-A Place of Service:  SNF (31)   CODE STATUS: Full Code  Allergies  Allergen Reactions  . Ace Inhibitors Swelling    Chief Complaint  Patient presents with  . Medical Management of Chronic Issues    Follow up    HPI:  She is a long term resident of this facility being seen for the management of her chronic illnesses. Overall her status is stable. She tells me that she is feeling good and has no concerns. She does continue to get out of bed on a daily basis. There are no nursing concerns at this time.    Past Medical History:  Diagnosis Date  . Acute respiratory failure ([place]) 12/21/10  . Angina at rest [facility])   . Aspiration pneumonia ([place])   . CAD (coronary artery disease)    Dr. Matthew Saras  . Chronic constipation   . Chronic diastolic CHF (congestive heart failure) ([place])   . CKD (chronic kidney disease)   . COPD (chronic obstructive pulmonary disease) ([place])   . Delirium, induced by drug ([place])    polypharmacy  . Dementia   . DM (diabetes mellitus) type II controlled peripheral vascular disorder ([place])   . 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 Fall    history of falls  . GERD (gastroesophageal reflux disease)   . Hyperlipidemia LDL goal < 100   . Major depressive disorder   . 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 Migraine    "monthly" (11/06/2014)  . Myocardial infarction    "I've had about 3" (11/06/2014)  . On home oxygen therapy    "prn at the nursing home" (11/06/2014)  . Pneumonia 12/21/10  . Pulmonary hypertension   . Rectal cancer ([place])    s/p colostomy  . Sacral fracture, closed ([place])    s/p kyphoplasty  . Stroke [facility]) 2011   postop  . Stroke [facility]) ?2014   "left side sometimes weaker since" (11/06/2014)  . Thyroid goiter   . Vascular  dementia with depressed mood     Past Surgical History:  Procedure Laterality Date  . ABDOMINAL HYSTERECTOMY    . APPENDECTOMY    . COLON SURGERY    . COLOSTOMY     rectal ca  . KYPHOPLASTY     sacrum  . right cataract  11-19-2015  . TONSILLECTOMY    . TUBAL LIGATION      Social History   Social History  . Marital status: Widowed    Spouse name: N/A  . Number of children: N/A  . Years of education: N/A   Occupational History  .  Unemployed    custodial   Social History Main Topics  . Smoking status: Former Smoker    Packs/day: 0.50    Years: 2.00    Types: Cigarettes  . Smokeless tobacco: Never Used     Comment: "quit smoking cigarettes in 1969"  . Alcohol use No  . Drug use:      Comment: previously smoked crack, has had positive UDS for amphetamines and narcotics  . Sexual activity: No   Other Topics Concern  . 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 No pertinent family history.    VITAL SIGNS BP (!) 156/73   Pulse 65   Temp 98.5 F (36.9 C) (Oral)   Resp 18   Ht 5\' 7"  (1.702 m)   Wt 160 lb (72.6 kg)   SpO2 97%   BMI 25.06 kg/m   Patient's Medications  New Prescriptions   No medications on file  Previous Medications   ACETAMINOPHEN (TYLENOL) 325 MG TABLET    Take 650 mg by mouth every 6 (six) hours as needed for mild pain.   AMLODIPINE (NORVASC) 10 MG TABLET    Take 10 mg by mouth daily.   ARTIFICIAL TEAR SOLUTION (TEARS PURE) 0.1-0.3 % SOLN    Apply 1 drop to eye every 8 (eight) hours as needed.   ASPIRIN (ASPIRIN EC) 81 MG EC TABLET    Take 81 mg by mouth daily. Swallow whole.   ATORVASTATIN (LIPITOR) 80 MG TABLET    Take 1 tablet (80 mg total) by mouth daily.   CALCITRIOL (ROCALTROL) 0.25 MCG CAPSULE    Take 0.25 mcg by mouth daily.   CARVEDILOL (COREG) 6.25 MG TABLET    Take 6.25 mg by mouth 2 (two) times daily  with a meal.   CHOLECALCIFEROL 50000 UNITS TABS    Take 50,000 Units by mouth. Take one tablet by mouth one time a day  Starting on the 14 th and ending on the 14 th every month.   DOCUSATE SODIUM (COLACE) 100 MG CAPSULE    Take 100 mg by mouth 3 (three) times daily.   HYDRALAZINE (APRESOLINE) 25 MG TABLET    Take 25 mg by mouth 2 (two) times daily.    ISOSORBIDE MONONITRATE (ISMO,MONOKET) 20 MG TABLET    Take 60 mg by mouth daily.    LOSARTAN (COZAAR) 50 MG TABLET    Take 50 mg by mouth 2 (two) times daily.   NITROGLYCERIN (NITROSTAT) 0.4 MG SL TABLET    Place 0.4 mg under the tongue every 5 (five) minutes as needed for chest pain.   OMEPRAZOLE (PRILOSEC) 20 MG CAPSULE    Take 20 mg by mouth daily.    PAROXETINE (PAXIL) 20 MG TABLET    Take by mouth. Take 1.5 tablet by mouth daily.   POTASSIUM CHLORIDE SA (K-DUR,KLOR-CON) 20 MEQ TABLET    Take 20 mEq by mouth daily.   PREDNISOLONE 5 MG TABS TABLET    Take 5 mg by mouth daily. Reported on 01/23/2016   SENNOSIDES-DOCUSATE SODIUM (SENOKOT-S) 8.6-50 MG TABLET    Take 2 tablets by mouth every evening.    TRAZODONE (DESYREL) 50 MG TABLET    Take 50 mg by mouth at bedtime.  Modified Medications   No medications on file  Discontinued Medications   No medications on file     SIGNIFICANT DIAGNOSTIC EXAMS  11-06-14: chest x-ray: Probable left lower lobe retrocardiac opacity, atelectasis versuspneumonia. Grossly stable cardiomegaly allowing for differences in technique.   LABS REVIEWED:   11-23-15: glucose 61; bun 21; creat 1.41; k+ 3.7; na++144; phos 4.7; pth 67; ca++9.2  01-24-16: wbc 4.2; hgb 12.;4 hct 38.4; mcv 80.3 plt 246; glucose 74; bun 12; creat 1.3; k+ 3.3; na++ 143; liver normal albumin 3.6; chol 145; ldl 78; tri 94; hdl 48; tsh 0.54; hb a1c 6.8;  03-13-16: glucose 101; bun 13; creat 1.35; k+ 2.8; na++141 03-16-16: glucose 61; bun 18; creat 1.35; k+ 3.5; na++ 142 03-28-16: urine culture: e-coli: cipro  04-17-16: hgb a1c 6.3  08-14-16; wbc 5.7  hgb 12.1; hct 36.8 ;mcv 80.3; plt 229; glucose 107; bun 20; creat 1.49; k+ 4.1; na++ 140; liver normal albumin 3.3; chol 153; ldl 87; trig 135; hdl 39; hgb a1c 6.7; urine micro-albumin 1.3      Review of Systems Constitutional: Negative for appetite change and fatigue.  HENT: Negative for congestion.   Respiratory: Negative for cough, chest tightness and shortness of breath.   Cardiovascular: Negative for chest pain, palpitations and leg swelling.  Gastrointestinal: Negative for nausea, abdominal pain, diarrhea and constipation.  Musculoskeletal: Negative for myalgias and arthralgias.  Skin: Negative for pallor.  Neurological: Negative for dizziness.  Psychiatric/Behavioral: The patient is not nervous/anxious.       Physical Exam Constitutional: No distress.  Eyes: Conjunctivae are normal.  Neck: Neck supple. No JVD present. No thyromegaly present.  Cardiovascular: Normal rate, regular rhythm and intact distal pulses.   Respiratory: Effort normal and breath sounds normal. No respiratory distress. She has no wheezes.  GI: Soft. Bowel sounds are normal. She exhibits no distension. There is no tenderness.  Has colostomy   Musculoskeletal: She exhibits no edema.  Able to move all extremities   Lymphadenopathy:    She has no cervical adenopathy.  Neurological: She is alert.  Skin: Skin is warm and dry. She is not diaphoretic.  Psychiatric: She has a normal mood and affect.      ASSESSMENT/ PLAN:  1. Hypertension: will continue norvasc 10 mg daily;  hydralazine 25 mg twice daily  coreg  6.25 mg twice daily cozaar  50 mg twice daily   2. Dyslipidemia: will continue lipitor  80 mg daily  ldl is 87  3. Diabetes: her glipizide was stopped due to low cbg readings in the am;  hgb a1c is 6.7; will not make changes will monitor her status.   4. COPD: is stable  will continue prednisone 5 mg daily and will monitor her status.   5. CAD: history of MI X3;  no complaints of chest pain  present will continue imdur 60 mg daily; asa 81 mg daily and prn ntg; will monitor  6. Jerrye Bushy: will continue  prilosec 20 mg daily    7. Constipation: will continue colace three times daily; and senna s 2 tabs daily   8. Depression: will continue paxil 30 mg daily. Take trazodone 50 mg nightly   9.  CVA: is neurologically stable; will continue asa 81 mg daily   10. Vascular dementia: on change in her status; is presently not on medications; will not make changes will monitor her status.   11. Diastolic heart failure: will continue  hydralazine 25 mg twice daily imdur 60 mg daily  Will continue coreg  6.25 mg twice daily and will monitor her status.     Ok Edwards NP [facility]  Contact [phone] Monday through Friday 8am- 5pm  After hours call [phone]

## 2016-09-15 DIAGNOSIS — I5032 Chronic diastolic (congestive) heart failure: Secondary | ICD-10-CM | POA: Diagnosis not present

## 2016-09-15 DIAGNOSIS — R2689 Other abnormalities of gait and mobility: Secondary | ICD-10-CM | POA: Diagnosis not present

## 2016-09-15 DIAGNOSIS — N183 Chronic kidney disease, stage 3 (moderate): Secondary | ICD-10-CM | POA: Diagnosis not present

## 2016-09-15 DIAGNOSIS — I10 Essential (primary) hypertension: Secondary | ICD-10-CM | POA: Diagnosis not present

## 2016-09-15 DIAGNOSIS — M6281 Muscle weakness (generalized): Secondary | ICD-10-CM | POA: Diagnosis not present

## 2016-09-15 DIAGNOSIS — F0280 Dementia in other diseases classified elsewhere without behavioral disturbance: Secondary | ICD-10-CM | POA: Diagnosis not present

## 2016-09-16 DIAGNOSIS — R2689 Other abnormalities of gait and mobility: Secondary | ICD-10-CM | POA: Diagnosis not present

## 2016-09-16 DIAGNOSIS — N183 Chronic kidney disease, stage 3 (moderate): Secondary | ICD-10-CM | POA: Diagnosis not present

## 2016-09-16 DIAGNOSIS — M6281 Muscle weakness (generalized): Secondary | ICD-10-CM | POA: Diagnosis not present

## 2016-09-16 DIAGNOSIS — I5032 Chronic diastolic (congestive) heart failure: Secondary | ICD-10-CM | POA: Diagnosis not present

## 2016-09-16 DIAGNOSIS — F0280 Dementia in other diseases classified elsewhere without behavioral disturbance: Secondary | ICD-10-CM | POA: Diagnosis not present

## 2016-09-16 DIAGNOSIS — I10 Essential (primary) hypertension: Secondary | ICD-10-CM | POA: Diagnosis not present

## 2016-09-17 DIAGNOSIS — I10 Essential (primary) hypertension: Secondary | ICD-10-CM | POA: Diagnosis not present

## 2016-09-17 DIAGNOSIS — F0280 Dementia in other diseases classified elsewhere without behavioral disturbance: Secondary | ICD-10-CM | POA: Diagnosis not present

## 2016-09-17 DIAGNOSIS — N183 Chronic kidney disease, stage 3 (moderate): Secondary | ICD-10-CM | POA: Diagnosis not present

## 2016-09-17 DIAGNOSIS — R2689 Other abnormalities of gait and mobility: Secondary | ICD-10-CM | POA: Diagnosis not present

## 2016-09-17 DIAGNOSIS — M6281 Muscle weakness (generalized): Secondary | ICD-10-CM | POA: Diagnosis not present

## 2016-09-17 DIAGNOSIS — I5032 Chronic diastolic (congestive) heart failure: Secondary | ICD-10-CM | POA: Diagnosis not present

## 2016-09-18 DIAGNOSIS — M6281 Muscle weakness (generalized): Secondary | ICD-10-CM | POA: Diagnosis not present

## 2016-09-18 DIAGNOSIS — I10 Essential (primary) hypertension: Secondary | ICD-10-CM | POA: Diagnosis not present

## 2016-09-18 DIAGNOSIS — I5032 Chronic diastolic (congestive) heart failure: Secondary | ICD-10-CM | POA: Diagnosis not present

## 2016-09-18 DIAGNOSIS — N183 Chronic kidney disease, stage 3 (moderate): Secondary | ICD-10-CM | POA: Diagnosis not present

## 2016-09-18 DIAGNOSIS — F0280 Dementia in other diseases classified elsewhere without behavioral disturbance: Secondary | ICD-10-CM | POA: Diagnosis not present

## 2016-09-18 DIAGNOSIS — R2689 Other abnormalities of gait and mobility: Secondary | ICD-10-CM | POA: Diagnosis not present

## 2016-09-21 DIAGNOSIS — M6281 Muscle weakness (generalized): Secondary | ICD-10-CM | POA: Diagnosis not present

## 2016-09-21 DIAGNOSIS — I5032 Chronic diastolic (congestive) heart failure: Secondary | ICD-10-CM | POA: Diagnosis not present

## 2016-09-21 DIAGNOSIS — N183 Chronic kidney disease, stage 3 (moderate): Secondary | ICD-10-CM | POA: Diagnosis not present

## 2016-09-21 DIAGNOSIS — I10 Essential (primary) hypertension: Secondary | ICD-10-CM | POA: Diagnosis not present

## 2016-09-21 DIAGNOSIS — F0280 Dementia in other diseases classified elsewhere without behavioral disturbance: Secondary | ICD-10-CM | POA: Diagnosis not present

## 2016-09-21 DIAGNOSIS — R2689 Other abnormalities of gait and mobility: Secondary | ICD-10-CM | POA: Diagnosis not present

## 2016-09-23 DIAGNOSIS — N183 Chronic kidney disease, stage 3 (moderate): Secondary | ICD-10-CM | POA: Diagnosis not present

## 2016-09-23 DIAGNOSIS — M6281 Muscle weakness (generalized): Secondary | ICD-10-CM | POA: Diagnosis not present

## 2016-09-23 DIAGNOSIS — R2689 Other abnormalities of gait and mobility: Secondary | ICD-10-CM | POA: Diagnosis not present

## 2016-09-23 DIAGNOSIS — F0280 Dementia in other diseases classified elsewhere without behavioral disturbance: Secondary | ICD-10-CM | POA: Diagnosis not present

## 2016-09-23 DIAGNOSIS — I5032 Chronic diastolic (congestive) heart failure: Secondary | ICD-10-CM | POA: Diagnosis not present

## 2016-09-23 DIAGNOSIS — I10 Essential (primary) hypertension: Secondary | ICD-10-CM | POA: Diagnosis not present

## 2016-09-24 DIAGNOSIS — I5032 Chronic diastolic (congestive) heart failure: Secondary | ICD-10-CM | POA: Diagnosis not present

## 2016-09-24 DIAGNOSIS — R2689 Other abnormalities of gait and mobility: Secondary | ICD-10-CM | POA: Diagnosis not present

## 2016-09-24 DIAGNOSIS — N183 Chronic kidney disease, stage 3 (moderate): Secondary | ICD-10-CM | POA: Diagnosis not present

## 2016-09-24 DIAGNOSIS — I10 Essential (primary) hypertension: Secondary | ICD-10-CM | POA: Diagnosis not present

## 2016-09-24 DIAGNOSIS — F0280 Dementia in other diseases classified elsewhere without behavioral disturbance: Secondary | ICD-10-CM | POA: Diagnosis not present

## 2016-09-24 DIAGNOSIS — M6281 Muscle weakness (generalized): Secondary | ICD-10-CM | POA: Diagnosis not present

## 2016-09-26 DIAGNOSIS — M6281 Muscle weakness (generalized): Secondary | ICD-10-CM | POA: Diagnosis not present

## 2016-09-26 DIAGNOSIS — F0280 Dementia in other diseases classified elsewhere without behavioral disturbance: Secondary | ICD-10-CM | POA: Diagnosis not present

## 2016-09-26 DIAGNOSIS — I5032 Chronic diastolic (congestive) heart failure: Secondary | ICD-10-CM | POA: Diagnosis not present

## 2016-09-26 DIAGNOSIS — N183 Chronic kidney disease, stage 3 (moderate): Secondary | ICD-10-CM | POA: Diagnosis not present

## 2016-09-26 DIAGNOSIS — I10 Essential (primary) hypertension: Secondary | ICD-10-CM | POA: Diagnosis not present

## 2016-09-26 DIAGNOSIS — R2689 Other abnormalities of gait and mobility: Secondary | ICD-10-CM | POA: Diagnosis not present

## 2016-09-28 DIAGNOSIS — I5032 Chronic diastolic (congestive) heart failure: Secondary | ICD-10-CM | POA: Diagnosis not present

## 2016-09-28 DIAGNOSIS — F0280 Dementia in other diseases classified elsewhere without behavioral disturbance: Secondary | ICD-10-CM | POA: Diagnosis not present

## 2016-09-28 DIAGNOSIS — M6281 Muscle weakness (generalized): Secondary | ICD-10-CM | POA: Diagnosis not present

## 2016-09-28 DIAGNOSIS — N183 Chronic kidney disease, stage 3 (moderate): Secondary | ICD-10-CM | POA: Diagnosis not present

## 2016-09-28 DIAGNOSIS — R2689 Other abnormalities of gait and mobility: Secondary | ICD-10-CM | POA: Diagnosis not present

## 2016-09-28 DIAGNOSIS — I10 Essential (primary) hypertension: Secondary | ICD-10-CM | POA: Diagnosis not present

## 2016-09-29 DIAGNOSIS — I5032 Chronic diastolic (congestive) heart failure: Secondary | ICD-10-CM | POA: Diagnosis not present

## 2016-09-29 DIAGNOSIS — R2689 Other abnormalities of gait and mobility: Secondary | ICD-10-CM | POA: Diagnosis not present

## 2016-09-29 DIAGNOSIS — F0280 Dementia in other diseases classified elsewhere without behavioral disturbance: Secondary | ICD-10-CM | POA: Diagnosis not present

## 2016-09-29 DIAGNOSIS — N183 Chronic kidney disease, stage 3 (moderate): Secondary | ICD-10-CM | POA: Diagnosis not present

## 2016-09-29 DIAGNOSIS — I10 Essential (primary) hypertension: Secondary | ICD-10-CM | POA: Diagnosis not present

## 2016-09-29 DIAGNOSIS — M6281 Muscle weakness (generalized): Secondary | ICD-10-CM | POA: Diagnosis not present

## 2016-09-30 DIAGNOSIS — F0280 Dementia in other diseases classified elsewhere without behavioral disturbance: Secondary | ICD-10-CM | POA: Diagnosis not present

## 2016-09-30 DIAGNOSIS — N183 Chronic kidney disease, stage 3 (moderate): Secondary | ICD-10-CM | POA: Diagnosis not present

## 2016-09-30 DIAGNOSIS — I10 Essential (primary) hypertension: Secondary | ICD-10-CM | POA: Diagnosis not present

## 2016-09-30 DIAGNOSIS — M6281 Muscle weakness (generalized): Secondary | ICD-10-CM | POA: Diagnosis not present

## 2016-09-30 DIAGNOSIS — I5032 Chronic diastolic (congestive) heart failure: Secondary | ICD-10-CM | POA: Diagnosis not present

## 2016-09-30 DIAGNOSIS — R2689 Other abnormalities of gait and mobility: Secondary | ICD-10-CM | POA: Diagnosis not present

## 2016-10-02 DIAGNOSIS — N183 Chronic kidney disease, stage 3 (moderate): Secondary | ICD-10-CM | POA: Diagnosis not present

## 2016-10-02 DIAGNOSIS — M6281 Muscle weakness (generalized): Secondary | ICD-10-CM | POA: Diagnosis not present

## 2016-10-02 DIAGNOSIS — I5032 Chronic diastolic (congestive) heart failure: Secondary | ICD-10-CM | POA: Diagnosis not present

## 2016-10-02 DIAGNOSIS — I10 Essential (primary) hypertension: Secondary | ICD-10-CM | POA: Diagnosis not present

## 2016-10-02 DIAGNOSIS — F0280 Dementia in other diseases classified elsewhere without behavioral disturbance: Secondary | ICD-10-CM | POA: Diagnosis not present

## 2016-10-02 DIAGNOSIS — R2689 Other abnormalities of gait and mobility: Secondary | ICD-10-CM | POA: Diagnosis not present

## 2016-10-05 DIAGNOSIS — F0280 Dementia in other diseases classified elsewhere without behavioral disturbance: Secondary | ICD-10-CM | POA: Diagnosis not present

## 2016-10-05 DIAGNOSIS — R2689 Other abnormalities of gait and mobility: Secondary | ICD-10-CM | POA: Diagnosis not present

## 2016-10-05 DIAGNOSIS — N183 Chronic kidney disease, stage 3 (moderate): Secondary | ICD-10-CM | POA: Diagnosis not present

## 2016-10-05 DIAGNOSIS — I10 Essential (primary) hypertension: Secondary | ICD-10-CM | POA: Diagnosis not present

## 2016-10-05 DIAGNOSIS — I5032 Chronic diastolic (congestive) heart failure: Secondary | ICD-10-CM | POA: Diagnosis not present

## 2016-10-05 DIAGNOSIS — M6281 Muscle weakness (generalized): Secondary | ICD-10-CM | POA: Diagnosis not present

## 2016-10-06 DIAGNOSIS — I5032 Chronic diastolic (congestive) heart failure: Secondary | ICD-10-CM | POA: Diagnosis not present

## 2016-10-06 DIAGNOSIS — F0280 Dementia in other diseases classified elsewhere without behavioral disturbance: Secondary | ICD-10-CM | POA: Diagnosis not present

## 2016-10-06 DIAGNOSIS — M6281 Muscle weakness (generalized): Secondary | ICD-10-CM | POA: Diagnosis not present

## 2016-10-06 DIAGNOSIS — N183 Chronic kidney disease, stage 3 (moderate): Secondary | ICD-10-CM | POA: Diagnosis not present

## 2016-10-06 DIAGNOSIS — R2689 Other abnormalities of gait and mobility: Secondary | ICD-10-CM | POA: Diagnosis not present

## 2016-10-06 DIAGNOSIS — I10 Essential (primary) hypertension: Secondary | ICD-10-CM | POA: Diagnosis not present

## 2016-10-08 DIAGNOSIS — I5032 Chronic diastolic (congestive) heart failure: Secondary | ICD-10-CM | POA: Diagnosis not present

## 2016-10-08 DIAGNOSIS — R2689 Other abnormalities of gait and mobility: Secondary | ICD-10-CM | POA: Diagnosis not present

## 2016-10-08 DIAGNOSIS — F0280 Dementia in other diseases classified elsewhere without behavioral disturbance: Secondary | ICD-10-CM | POA: Diagnosis not present

## 2016-10-08 DIAGNOSIS — M6281 Muscle weakness (generalized): Secondary | ICD-10-CM | POA: Diagnosis not present

## 2016-10-08 DIAGNOSIS — N183 Chronic kidney disease, stage 3 (moderate): Secondary | ICD-10-CM | POA: Diagnosis not present

## 2016-10-08 DIAGNOSIS — I10 Essential (primary) hypertension: Secondary | ICD-10-CM | POA: Diagnosis not present

## 2016-10-10 DIAGNOSIS — I6789 Other cerebrovascular disease: Secondary | ICD-10-CM | POA: Diagnosis not present

## 2016-10-10 DIAGNOSIS — J449 Chronic obstructive pulmonary disease, unspecified: Secondary | ICD-10-CM | POA: Diagnosis not present

## 2016-10-10 DIAGNOSIS — M6281 Muscle weakness (generalized): Secondary | ICD-10-CM | POA: Diagnosis not present

## 2016-10-10 DIAGNOSIS — R2689 Other abnormalities of gait and mobility: Secondary | ICD-10-CM | POA: Diagnosis not present

## 2016-10-10 DIAGNOSIS — F329 Major depressive disorder, single episode, unspecified: Secondary | ICD-10-CM | POA: Diagnosis not present

## 2016-10-10 DIAGNOSIS — F0280 Dementia in other diseases classified elsewhere without behavioral disturbance: Secondary | ICD-10-CM | POA: Diagnosis not present

## 2016-10-10 DIAGNOSIS — E119 Type 2 diabetes mellitus without complications: Secondary | ICD-10-CM | POA: Diagnosis not present

## 2016-10-10 DIAGNOSIS — I5032 Chronic diastolic (congestive) heart failure: Secondary | ICD-10-CM | POA: Diagnosis not present

## 2016-10-10 DIAGNOSIS — N183 Chronic kidney disease, stage 3 (moderate): Secondary | ICD-10-CM | POA: Diagnosis not present

## 2016-10-10 DIAGNOSIS — I10 Essential (primary) hypertension: Secondary | ICD-10-CM | POA: Diagnosis not present

## 2016-10-10 DIAGNOSIS — E785 Hyperlipidemia, unspecified: Secondary | ICD-10-CM | POA: Diagnosis not present

## 2016-10-10 DIAGNOSIS — K219 Gastro-esophageal reflux disease without esophagitis: Secondary | ICD-10-CM | POA: Diagnosis not present

## 2016-10-10 DIAGNOSIS — N390 Urinary tract infection, site not specified: Secondary | ICD-10-CM | POA: Diagnosis not present

## 2016-10-12 DIAGNOSIS — I10 Essential (primary) hypertension: Secondary | ICD-10-CM | POA: Diagnosis not present

## 2016-10-12 DIAGNOSIS — R2689 Other abnormalities of gait and mobility: Secondary | ICD-10-CM | POA: Diagnosis not present

## 2016-10-12 DIAGNOSIS — I5032 Chronic diastolic (congestive) heart failure: Secondary | ICD-10-CM | POA: Diagnosis not present

## 2016-10-12 DIAGNOSIS — F0280 Dementia in other diseases classified elsewhere without behavioral disturbance: Secondary | ICD-10-CM | POA: Diagnosis not present

## 2016-10-12 DIAGNOSIS — M6281 Muscle weakness (generalized): Secondary | ICD-10-CM | POA: Diagnosis not present

## 2016-10-12 DIAGNOSIS — N183 Chronic kidney disease, stage 3 (moderate): Secondary | ICD-10-CM | POA: Diagnosis not present

## 2016-10-13 DIAGNOSIS — R2689 Other abnormalities of gait and mobility: Secondary | ICD-10-CM | POA: Diagnosis not present

## 2016-10-13 DIAGNOSIS — M6281 Muscle weakness (generalized): Secondary | ICD-10-CM | POA: Diagnosis not present

## 2016-10-13 DIAGNOSIS — I10 Essential (primary) hypertension: Secondary | ICD-10-CM | POA: Diagnosis not present

## 2016-10-13 DIAGNOSIS — F0280 Dementia in other diseases classified elsewhere without behavioral disturbance: Secondary | ICD-10-CM | POA: Diagnosis not present

## 2016-10-13 DIAGNOSIS — N183 Chronic kidney disease, stage 3 (moderate): Secondary | ICD-10-CM | POA: Diagnosis not present

## 2016-10-13 DIAGNOSIS — I5032 Chronic diastolic (congestive) heart failure: Secondary | ICD-10-CM | POA: Diagnosis not present

## 2016-10-15 DIAGNOSIS — R2689 Other abnormalities of gait and mobility: Secondary | ICD-10-CM | POA: Diagnosis not present

## 2016-10-15 DIAGNOSIS — M6281 Muscle weakness (generalized): Secondary | ICD-10-CM | POA: Diagnosis not present

## 2016-10-15 DIAGNOSIS — I10 Essential (primary) hypertension: Secondary | ICD-10-CM | POA: Diagnosis not present

## 2016-10-15 DIAGNOSIS — N183 Chronic kidney disease, stage 3 (moderate): Secondary | ICD-10-CM | POA: Diagnosis not present

## 2016-10-15 DIAGNOSIS — F0280 Dementia in other diseases classified elsewhere without behavioral disturbance: Secondary | ICD-10-CM | POA: Diagnosis not present

## 2016-10-15 DIAGNOSIS — I5032 Chronic diastolic (congestive) heart failure: Secondary | ICD-10-CM | POA: Diagnosis not present

## 2016-10-16 DIAGNOSIS — I5032 Chronic diastolic (congestive) heart failure: Secondary | ICD-10-CM | POA: Diagnosis not present

## 2016-10-16 DIAGNOSIS — N183 Chronic kidney disease, stage 3 (moderate): Secondary | ICD-10-CM | POA: Diagnosis not present

## 2016-10-16 DIAGNOSIS — F0280 Dementia in other diseases classified elsewhere without behavioral disturbance: Secondary | ICD-10-CM | POA: Diagnosis not present

## 2016-10-16 DIAGNOSIS — I10 Essential (primary) hypertension: Secondary | ICD-10-CM | POA: Diagnosis not present

## 2016-10-16 DIAGNOSIS — R2689 Other abnormalities of gait and mobility: Secondary | ICD-10-CM | POA: Diagnosis not present

## 2016-10-16 DIAGNOSIS — M6281 Muscle weakness (generalized): Secondary | ICD-10-CM | POA: Diagnosis not present

## 2016-10-17 DIAGNOSIS — R2689 Other abnormalities of gait and mobility: Secondary | ICD-10-CM | POA: Diagnosis not present

## 2016-10-17 DIAGNOSIS — M6281 Muscle weakness (generalized): Secondary | ICD-10-CM | POA: Diagnosis not present

## 2016-10-17 DIAGNOSIS — I5032 Chronic diastolic (congestive) heart failure: Secondary | ICD-10-CM | POA: Diagnosis not present

## 2016-10-17 DIAGNOSIS — N183 Chronic kidney disease, stage 3 (moderate): Secondary | ICD-10-CM | POA: Diagnosis not present

## 2016-10-17 DIAGNOSIS — F0280 Dementia in other diseases classified elsewhere without behavioral disturbance: Secondary | ICD-10-CM | POA: Diagnosis not present

## 2016-10-17 DIAGNOSIS — I10 Essential (primary) hypertension: Secondary | ICD-10-CM | POA: Diagnosis not present

## 2016-10-19 ENCOUNTER — Non-Acute Institutional Stay (SKILLED_NURSING_FACILITY): Payer: Medicare Other | Admitting: Adult Health

## 2016-10-19 ENCOUNTER — Encounter: Payer: Self-pay | Admitting: Adult Health

## 2016-10-19 DIAGNOSIS — Z933 Colostomy status: Secondary | ICD-10-CM | POA: Diagnosis not present

## 2016-10-19 DIAGNOSIS — I5032 Chronic diastolic (congestive) heart failure: Secondary | ICD-10-CM | POA: Diagnosis not present

## 2016-10-19 DIAGNOSIS — F0151 Vascular dementia with behavioral disturbance: Secondary | ICD-10-CM

## 2016-10-19 DIAGNOSIS — E1169 Type 2 diabetes mellitus with other specified complication: Secondary | ICD-10-CM | POA: Diagnosis not present

## 2016-10-19 DIAGNOSIS — I110 Hypertensive heart disease with heart failure: Secondary | ICD-10-CM

## 2016-10-19 DIAGNOSIS — I6350 Cerebral infarction due to unspecified occlusion or stenosis of unspecified cerebral artery: Secondary | ICD-10-CM

## 2016-10-19 DIAGNOSIS — F329 Major depressive disorder, single episode, unspecified: Secondary | ICD-10-CM | POA: Diagnosis not present

## 2016-10-19 DIAGNOSIS — J438 Other emphysema: Secondary | ICD-10-CM | POA: Diagnosis not present

## 2016-10-19 DIAGNOSIS — E1151 Type 2 diabetes mellitus with diabetic peripheral angiopathy without gangrene: Secondary | ICD-10-CM | POA: Diagnosis not present

## 2016-10-19 DIAGNOSIS — E785 Hyperlipidemia, unspecified: Secondary | ICD-10-CM | POA: Diagnosis not present

## 2016-10-19 DIAGNOSIS — F0153 Vascular dementia, unspecified severity, with mood disturbance: Secondary | ICD-10-CM

## 2016-10-19 NOTE — Progress Notes (Signed)
Patient ID: Sheila Flynn, female   DOB: Jan 08, 1946, 70 y.o.   MRN: [id number]   Location:   [facility] Room Number: 128-A Place of Service:  SNF (31)   CODE STATUS: Full Code  Allergies  Allergen Reactions  . Ace Inhibitors Swelling    Chief Complaint  Patient presents with  . Medical Management of Chronic Issues    Follow up    HPI:  She is a long term resident of this facility being seen for the management of her chronic illnesses. Her status remains stable overall. She does get out of bed; and propels herself throughout the facility. She is not voicing any complaints today. There are no nursing concerns at this time.    Past Medical History:  Diagnosis Date  . Acute respiratory failure ([place]) 12/21/10  . Angina at rest [facility])   . Aspiration pneumonia ([place])   . CAD (coronary artery disease)    Dr. Matthew Saras  . Chronic constipation   . Chronic diastolic CHF (congestive heart failure) ([place])   . CKD (chronic kidney disease)   . COPD (chronic obstructive pulmonary disease) ([place])   . Delirium, induced by drug ([place])    polypharmacy  . Dementia   . DM (diabetes mellitus) type II controlled peripheral vascular disorder ([place])   . 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 Fall    history of falls  . GERD (gastroesophageal reflux disease)   . Hyperlipidemia LDL goal < 100   . Major depressive disorder   . 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 Migraine    "monthly" (11/06/2014)  . Myocardial infarction    "I've had about 3" (11/06/2014)  . On home oxygen therapy    "prn at the nursing home" (11/06/2014)  . Pneumonia 12/21/10  . Pulmonary hypertension   . Rectal cancer ([place])    s/p colostomy  . Sacral fracture, closed ([place])    s/p kyphoplasty  . Stroke [facility]) 2011   postop  . Stroke [facility]) ?2014   "left side sometimes weaker since" (11/06/2014)  . Thyroid goiter   . Vascular  dementia with depressed mood     Past Surgical History:  Procedure Laterality Date  . ABDOMINAL HYSTERECTOMY    . APPENDECTOMY    . COLON SURGERY    . COLOSTOMY     rectal ca  . KYPHOPLASTY     sacrum  . right cataract  11-19-2015  . TONSILLECTOMY    . TUBAL LIGATION      Social History   Social History  . Marital status: Widowed    Spouse name: N/A  . Number of children: N/A  . Years of education: N/A   Occupational History  .  Unemployed    custodial   Social History Main Topics  . Smoking status: Former Smoker    Packs/day: 0.50    Years: 2.00    Types: Cigarettes  . Smokeless tobacco: Never Used     Comment: "quit smoking cigarettes in 1969"  . Alcohol use No  . Drug use:      Comment: previously smoked crack, has had positive UDS for amphetamines and narcotics  . Sexual activity: No   Other Topics Concern  . 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 No pertinent family history.  VITAL SIGNS BP 136/68   Pulse 73   Temp 97 F (36.1 C) (Oral)   Resp 18   Ht 5\' 7"  (1.702 m)   Wt 160 lb 4 oz (72.7 kg)   SpO2 95%   BMI 25.10 kg/m   Patient's Medications  New Prescriptions   No medications on file  Previous Medications   ACETAMINOPHEN (TYLENOL) 325 MG TABLET    Take 650 mg by mouth every 6 (six) hours as needed for mild pain.   AMLODIPINE (NORVASC) 10 MG TABLET    Take 10 mg by mouth daily.   ARTIFICIAL TEAR SOLUTION (TEARS PURE) 0.1-0.3 % SOLN    Apply 1 drop to eye every 8 (eight) hours as needed.   ASPIRIN (ASPIRIN EC) 81 MG EC TABLET    Take 81 mg by mouth daily. Swallow whole.   ATORVASTATIN (LIPITOR) 80 MG TABLET    Take 1 tablet (80 mg total) by mouth daily.   CALCITRIOL (ROCALTROL) 0.25 MCG CAPSULE    Take 0.25 mcg by mouth daily.   CARVEDILOL (COREG) 6.25 MG TABLET    Take 6.25 mg by mouth 2 (two) times daily with  a meal.   CETIRIZINE (ZYRTEC) 10 MG TABLET    Take 10 mg by mouth daily.   CHOLECALCIFEROL 50000 UNITS TABS    Take 50,000 Units by mouth. Take one tablet by mouth one time a day  Starting on the 14 th and ending on the 14 th every month.   DOCUSATE SODIUM (COLACE) 100 MG CAPSULE    Take 100 mg by mouth 3 (three) times daily.   HYDRALAZINE (APRESOLINE) 25 MG TABLET    Take 25 mg by mouth 2 (two) times daily.    ISOSORBIDE MONONITRATE (ISMO,MONOKET) 20 MG TABLET    Take 60 mg by mouth daily.    LOSARTAN (COZAAR) 50 MG TABLET    Take 50 mg by mouth 2 (two) times daily.   NITROGLYCERIN (NITROSTAT) 0.4 MG SL TABLET    Place 0.4 mg under the tongue every 5 (five) minutes as needed for chest pain.   OMEPRAZOLE (PRILOSEC) 20 MG CAPSULE    Take 20 mg by mouth daily.    PAROXETINE (PAXIL) 20 MG TABLET    Take by mouth. Take 1.5 tablet by mouth daily.   POTASSIUM CHLORIDE SA (K-DUR,KLOR-CON) 20 MEQ TABLET    Take 20 mEq by mouth daily.   PREDNISOLONE 5 MG TABS TABLET    Take 5 mg by mouth daily. Reported on 01/23/2016   SENNOSIDES-DOCUSATE SODIUM (SENOKOT-S) 8.6-50 MG TABLET    Take 2 tablets by mouth every evening.    TRAZODONE (DESYREL) 50 MG TABLET    Take 50 mg by mouth at bedtime.  Modified Medications   No medications on file  Discontinued Medications   No medications on file     SIGNIFICANT DIAGNOSTIC EXAMS  11-06-14: chest x-ray: Probable left lower lobe retrocardiac opacity, atelectasis versuspneumonia. Grossly stable cardiomegaly allowing for differences in technique.   LABS REVIEWED:   11-23-15: glucose 61; bun 21; creat 1.41; k+ 3.7; na++144; phos 4.7; pth 67; ca++9.2  01-24-16: wbc 4.2; hgb 12.;4 hct 38.4; mcv 80.3 plt 246; glucose 74; bun 12; creat 1.3; k+ 3.3; na++ 143; liver normal albumin 3.6; chol 145; ldl 78; tri 94; hdl 48; tsh 0.54; hb a1c 6.8;  03-13-16: glucose 101; bun 13; creat 1.35; k+ 2.8; na++141 03-16-16: glucose 61; bun 18; creat 1.35; k+ 3.5; na++ 142 03-28-16: urine  culture: e-coli:  cipro  04-17-16: hgb a1c 6.3  08-14-16; wbc 5.7 hgb 12.1; hct 36.8 ;mcv 80.3; plt 229; glucose 107; bun 20; creat 1.49; k+ 4.1; na++ 140; liver normal albumin 3.3; chol 153; ldl 87; trig 135; hdl 39; hgb a1c 6.7; urine micro-albumin 1.3      Review of Systems Constitutional: Negative for appetite change and fatigue.  HENT: Negative for congestion.   Respiratory: Negative for cough, chest tightness and shortness of breath.   Cardiovascular: Negative for chest pain, palpitations and leg swelling.  Gastrointestinal: Negative for nausea, abdominal pain, diarrhea and constipation.  Musculoskeletal: Negative for myalgias and arthralgias.  Skin: Negative for pallor.  Neurological: Negative for dizziness.  Psychiatric/Behavioral: The patient is not nervous/anxious.       Physical Exam Constitutional: No distress.  Eyes: Conjunctivae are normal.  Neck: Neck supple. No JVD present. No thyromegaly present.  Cardiovascular: Normal rate, regular rhythm and intact distal pulses.   Respiratory: Effort normal and breath sounds normal. No respiratory distress. She has no wheezes.  GI: Soft. Bowel sounds are normal. She exhibits no distension. There is no tenderness.  Has colostomy   Musculoskeletal: She exhibits no edema.  Able to move all extremities   Lymphadenopathy:    She has no cervical adenopathy.  Neurological: She is alert.  Skin: Skin is warm and dry. She is not diaphoretic.  Psychiatric: She has a normal mood and affect.      ASSESSMENT/ PLAN:  1. Hypertension: will continue norvasc 10 mg daily;  hydralazine 25 mg twice daily  coreg  6.25 mg twice daily cozaar  50 mg twice daily   2. Dyslipidemia: will continue lipitor  80 mg daily  ldl is 87  3. Diabetes: her glipizide was stopped due to low cbg readings in the am;  hgb a1c is 6.7; will not make changes will monitor her status.   4. COPD: is stable  will continue prednisone 5 mg daily and will monitor her  status.   5. CAD: history of MI X3;  no complaints of chest pain present will continue imdur 60 mg daily; asa 81 mg daily and prn ntg; will monitor  6. Jerrye Bushy: will continue  prilosec 20 mg daily    7. Constipation: will continue colace three times daily; and senna s 2 tabs daily   8. Depression: will continue paxil 30 mg daily. Take trazodone 50 mg nightly   9.  CVA: is neurologically stable; will continue asa 81 mg daily   10. Vascular dementia: on change in her status; is presently not on medications; will not make changes will monitor her status.   11. Diastolic heart failure: will continue  hydralazine 25 mg twice daily imdur 60 mg daily  Will continue coreg  6.25 mg twice daily and will monitor her status.     Ok Edwards NP [facility]  Contact [phone] Monday through Friday 8am- 5pm  After hours call [phone]

## 2016-10-20 DIAGNOSIS — M6281 Muscle weakness (generalized): Secondary | ICD-10-CM | POA: Diagnosis not present

## 2016-10-20 DIAGNOSIS — F0280 Dementia in other diseases classified elsewhere without behavioral disturbance: Secondary | ICD-10-CM | POA: Diagnosis not present

## 2016-10-20 DIAGNOSIS — I10 Essential (primary) hypertension: Secondary | ICD-10-CM | POA: Diagnosis not present

## 2016-10-20 DIAGNOSIS — I5032 Chronic diastolic (congestive) heart failure: Secondary | ICD-10-CM | POA: Diagnosis not present

## 2016-10-20 DIAGNOSIS — R2689 Other abnormalities of gait and mobility: Secondary | ICD-10-CM | POA: Diagnosis not present

## 2016-10-20 DIAGNOSIS — N183 Chronic kidney disease, stage 3 (moderate): Secondary | ICD-10-CM | POA: Diagnosis not present

## 2016-10-21 DIAGNOSIS — R2689 Other abnormalities of gait and mobility: Secondary | ICD-10-CM | POA: Diagnosis not present

## 2016-10-21 DIAGNOSIS — I5032 Chronic diastolic (congestive) heart failure: Secondary | ICD-10-CM | POA: Diagnosis not present

## 2016-10-21 DIAGNOSIS — N183 Chronic kidney disease, stage 3 (moderate): Secondary | ICD-10-CM | POA: Diagnosis not present

## 2016-10-21 DIAGNOSIS — M6281 Muscle weakness (generalized): Secondary | ICD-10-CM | POA: Diagnosis not present

## 2016-10-21 DIAGNOSIS — F0280 Dementia in other diseases classified elsewhere without behavioral disturbance: Secondary | ICD-10-CM | POA: Diagnosis not present

## 2016-10-21 DIAGNOSIS — I10 Essential (primary) hypertension: Secondary | ICD-10-CM | POA: Diagnosis not present

## 2016-10-22 DIAGNOSIS — I10 Essential (primary) hypertension: Secondary | ICD-10-CM | POA: Diagnosis not present

## 2016-10-22 DIAGNOSIS — R2689 Other abnormalities of gait and mobility: Secondary | ICD-10-CM | POA: Diagnosis not present

## 2016-10-22 DIAGNOSIS — M6281 Muscle weakness (generalized): Secondary | ICD-10-CM | POA: Diagnosis not present

## 2016-10-22 DIAGNOSIS — F0280 Dementia in other diseases classified elsewhere without behavioral disturbance: Secondary | ICD-10-CM | POA: Diagnosis not present

## 2016-10-22 DIAGNOSIS — N183 Chronic kidney disease, stage 3 (moderate): Secondary | ICD-10-CM | POA: Diagnosis not present

## 2016-10-22 DIAGNOSIS — I5032 Chronic diastolic (congestive) heart failure: Secondary | ICD-10-CM | POA: Diagnosis not present

## 2016-10-23 DIAGNOSIS — I10 Essential (primary) hypertension: Secondary | ICD-10-CM | POA: Diagnosis not present

## 2016-10-23 DIAGNOSIS — N183 Chronic kidney disease, stage 3 (moderate): Secondary | ICD-10-CM | POA: Diagnosis not present

## 2016-10-23 DIAGNOSIS — M6281 Muscle weakness (generalized): Secondary | ICD-10-CM | POA: Diagnosis not present

## 2016-10-23 DIAGNOSIS — R2689 Other abnormalities of gait and mobility: Secondary | ICD-10-CM | POA: Diagnosis not present

## 2016-10-23 DIAGNOSIS — F0280 Dementia in other diseases classified elsewhere without behavioral disturbance: Secondary | ICD-10-CM | POA: Diagnosis not present

## 2016-10-23 DIAGNOSIS — I5032 Chronic diastolic (congestive) heart failure: Secondary | ICD-10-CM | POA: Diagnosis not present

## 2016-10-25 DIAGNOSIS — I5032 Chronic diastolic (congestive) heart failure: Secondary | ICD-10-CM | POA: Diagnosis not present

## 2016-10-25 DIAGNOSIS — M6281 Muscle weakness (generalized): Secondary | ICD-10-CM | POA: Diagnosis not present

## 2016-10-25 DIAGNOSIS — R2689 Other abnormalities of gait and mobility: Secondary | ICD-10-CM | POA: Diagnosis not present

## 2016-10-25 DIAGNOSIS — F0280 Dementia in other diseases classified elsewhere without behavioral disturbance: Secondary | ICD-10-CM | POA: Diagnosis not present

## 2016-10-25 DIAGNOSIS — N183 Chronic kidney disease, stage 3 (moderate): Secondary | ICD-10-CM | POA: Diagnosis not present

## 2016-10-25 DIAGNOSIS — I10 Essential (primary) hypertension: Secondary | ICD-10-CM | POA: Diagnosis not present

## 2016-10-26 DIAGNOSIS — R2689 Other abnormalities of gait and mobility: Secondary | ICD-10-CM | POA: Diagnosis not present

## 2016-10-26 DIAGNOSIS — N183 Chronic kidney disease, stage 3 (moderate): Secondary | ICD-10-CM | POA: Diagnosis not present

## 2016-10-26 DIAGNOSIS — F0280 Dementia in other diseases classified elsewhere without behavioral disturbance: Secondary | ICD-10-CM | POA: Diagnosis not present

## 2016-10-26 DIAGNOSIS — M6281 Muscle weakness (generalized): Secondary | ICD-10-CM | POA: Diagnosis not present

## 2016-10-26 DIAGNOSIS — I10 Essential (primary) hypertension: Secondary | ICD-10-CM | POA: Diagnosis not present

## 2016-10-26 DIAGNOSIS — I5032 Chronic diastolic (congestive) heart failure: Secondary | ICD-10-CM | POA: Diagnosis not present

## 2016-10-27 DIAGNOSIS — I10 Essential (primary) hypertension: Secondary | ICD-10-CM | POA: Diagnosis not present

## 2016-10-27 DIAGNOSIS — M6281 Muscle weakness (generalized): Secondary | ICD-10-CM | POA: Diagnosis not present

## 2016-10-27 DIAGNOSIS — F0280 Dementia in other diseases classified elsewhere without behavioral disturbance: Secondary | ICD-10-CM | POA: Diagnosis not present

## 2016-10-27 DIAGNOSIS — R2689 Other abnormalities of gait and mobility: Secondary | ICD-10-CM | POA: Diagnosis not present

## 2016-10-27 DIAGNOSIS — I5032 Chronic diastolic (congestive) heart failure: Secondary | ICD-10-CM | POA: Diagnosis not present

## 2016-10-27 DIAGNOSIS — N183 Chronic kidney disease, stage 3 (moderate): Secondary | ICD-10-CM | POA: Diagnosis not present

## 2016-10-28 DIAGNOSIS — M6281 Muscle weakness (generalized): Secondary | ICD-10-CM | POA: Diagnosis not present

## 2016-10-28 DIAGNOSIS — R2689 Other abnormalities of gait and mobility: Secondary | ICD-10-CM | POA: Diagnosis not present

## 2016-10-28 DIAGNOSIS — I5032 Chronic diastolic (congestive) heart failure: Secondary | ICD-10-CM | POA: Diagnosis not present

## 2016-10-28 DIAGNOSIS — I10 Essential (primary) hypertension: Secondary | ICD-10-CM | POA: Diagnosis not present

## 2016-10-28 DIAGNOSIS — F0280 Dementia in other diseases classified elsewhere without behavioral disturbance: Secondary | ICD-10-CM | POA: Diagnosis not present

## 2016-10-28 DIAGNOSIS — N183 Chronic kidney disease, stage 3 (moderate): Secondary | ICD-10-CM | POA: Diagnosis not present

## 2016-11-19 DIAGNOSIS — Z79899 Other long term (current) drug therapy: Secondary | ICD-10-CM | POA: Diagnosis not present

## 2016-11-23 ENCOUNTER — Encounter: Payer: Self-pay | Admitting: Adult Health

## 2016-11-23 ENCOUNTER — Non-Acute Institutional Stay (SKILLED_NURSING_FACILITY): Payer: Medicare Other | Admitting: Adult Health

## 2016-11-23 DIAGNOSIS — J438 Other emphysema: Secondary | ICD-10-CM

## 2016-11-23 DIAGNOSIS — Z933 Colostomy status: Secondary | ICD-10-CM

## 2016-11-23 DIAGNOSIS — I110 Hypertensive heart disease with heart failure: Secondary | ICD-10-CM

## 2016-11-23 DIAGNOSIS — I5032 Chronic diastolic (congestive) heart failure: Secondary | ICD-10-CM | POA: Diagnosis not present

## 2016-11-23 DIAGNOSIS — C20 Malignant neoplasm of rectum: Secondary | ICD-10-CM | POA: Diagnosis not present

## 2016-11-23 DIAGNOSIS — E1151 Type 2 diabetes mellitus with diabetic peripheral angiopathy without gangrene: Secondary | ICD-10-CM

## 2016-11-23 DIAGNOSIS — I6350 Cerebral infarction due to unspecified occlusion or stenosis of unspecified cerebral artery: Secondary | ICD-10-CM

## 2016-11-23 NOTE — Progress Notes (Signed)
Location:    Psychiatrist of Service:  SNF (31)   CODE STATUS: full code   Allergies  Allergen Reactions  . Ace Inhibitors Swelling    Chief Complaint  Patient presents with  . Medical Management of Chronic Issues    HPI:  She is a long term resident of this facility being seen for the management of her chronic illnesses. She is not voicing any complaints at this time. She does get out of bed on a daily basis and propels herself throughout the facility. There are no nursing concerns at this time.    Past Medical History:  Diagnosis Date  . Acute respiratory failure ([place]) 12/21/10  . Angina at rest [facility])   . Aspiration pneumonia ([place])   . CAD (coronary artery disease)    Dr. Matthew Saras  . Chronic constipation   . Chronic diastolic CHF (congestive heart failure) ([place])   . CKD (chronic kidney disease)   . COPD (chronic obstructive pulmonary disease) ([place])   . Delirium, induced by drug ([place])    polypharmacy  . Dementia   . DM (diabetes mellitus) type II controlled peripheral vascular disorder ([place])   . 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 Fall    history of falls  . GERD (gastroesophageal reflux disease)   . Hyperlipidemia LDL goal < 100   . Major depressive disorder   . 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 Migraine    "monthly" (11/06/2014)  . Myocardial infarction    "I've had about 3" (11/06/2014)  . On home oxygen therapy    "prn at the nursing home" (11/06/2014)  . Pneumonia 12/21/10  . Pulmonary hypertension   . Rectal cancer ([place])    s/p colostomy  . Sacral fracture, closed ([place])    s/p kyphoplasty  . Stroke [facility]) 2011   postop  . Stroke [facility]) ?2014   "left side sometimes weaker since" (11/06/2014)  . Thyroid goiter   . Vascular dementia with depressed mood     Past Surgical History:  Procedure Laterality Date  . ABDOMINAL HYSTERECTOMY    . APPENDECTOMY      . COLON SURGERY    . COLOSTOMY     rectal ca  . KYPHOPLASTY     sacrum  . right cataract  11-19-2015  . TONSILLECTOMY    . TUBAL LIGATION      Social History   Social History  . Marital status: Widowed    Spouse name: N/A  . Number of children: N/A  . Years of education: N/A   Occupational History  .  Unemployed    custodial   Social History Main Topics  . Smoking status: Former Smoker    Packs/day: 0.50    Years: 2.00    Types: Cigarettes  . Smokeless tobacco: Never Used     Comment: "quit smoking cigarettes in 1969"  . Alcohol use No  . Drug use:      Comment: previously smoked crack, has had positive UDS for amphetamines and narcotics  . Sexual activity: No   Other Topics Concern  . Not on file   Social History Narrative   Born in [place], has some high school education, retired from custodian job, divorced, has children   No family history on file.    VITAL SIGNS BP 136/68   Pulse 73   Temp 97 F (36.1 C)   Resp  18   Ht 5\' 7"  (1.702 m)   Wt 161 lb 3.2 oz (73.1 kg)   SpO2 95%   BMI 25.25 kg/m   Patient's Medications  New Prescriptions   No medications on file  Previous Medications   ACETAMINOPHEN (TYLENOL) 325 MG TABLET    Take 650 mg by mouth every 6 (six) hours as needed for mild pain.   AMLODIPINE (NORVASC) 10 MG TABLET    Take 10 mg by mouth daily.   ARTIFICIAL TEAR SOLUTION (TEARS PURE) 0.1-0.3 % SOLN    Apply 1 drop to eye every 8 (eight) hours as needed.   ASPIRIN (ASPIRIN EC) 81 MG EC TABLET    Take 81 mg by mouth daily. Swallow whole.   ATORVASTATIN (LIPITOR) 80 MG TABLET    Take 1 tablet (80 mg total) by mouth daily.   CALCITRIOL (ROCALTROL) 0.25 MCG CAPSULE    Take 0.25 mcg by mouth daily.   CARVEDILOL (COREG) 6.25 MG TABLET    Take 6.25 mg by mouth 2 (two) times daily with a meal.   CETIRIZINE (ZYRTEC) 10 MG TABLET    Take 10 mg by mouth daily.   CHOLECALCIFEROL 50000 UNITS TABS    Take 50,000 Units by mouth. Take one tablet by  mouth one time a day  Starting on the 14 th and ending on the 14 th every month.   DOCUSATE SODIUM (COLACE) 100 MG CAPSULE    Take 100 mg by mouth 3 (three) times daily.   HYDRALAZINE (APRESOLINE) 25 MG TABLET    Take 25 mg by mouth 2 (two) times daily.    ISOSORBIDE MONONITRATE (ISMO,MONOKET) 20 MG TABLET    Take 60 mg by mouth daily.    LOSARTAN (COZAAR) 50 MG TABLET    Take 50 mg by mouth 2 (two) times daily.   NITROGLYCERIN (NITROSTAT) 0.4 MG SL TABLET    Place 0.4 mg under the tongue every 5 (five) minutes as needed for chest pain.   OMEPRAZOLE (PRILOSEC) 20 MG CAPSULE    Take 20 mg by mouth daily.    PAROXETINE (PAXIL) 20 MG TABLET    Take by mouth. Take 1.5 tablet by mouth daily.   POTASSIUM CHLORIDE SA (K-DUR,KLOR-CON) 20 MEQ TABLET    Take 20 mEq by mouth daily.   PREDNISOLONE 5 MG TABS TABLET    Take 5 mg by mouth daily. Reported on 01/23/2016   SENNOSIDES-DOCUSATE SODIUM (SENOKOT-S) 8.6-50 MG TABLET    Take 2 tablets by mouth every evening.    TRAZODONE (DESYREL) 50 MG TABLET    Take 50 mg by mouth at bedtime.  Modified Medications   No medications on file  Discontinued Medications   No medications on file     SIGNIFICANT DIAGNOSTIC EXAMS   11-06-14: chest x-ray: Probable left lower lobe retrocardiac opacity, atelectasis versuspneumonia. Grossly stable cardiomegaly allowing for differences in technique.   LABS REVIEWED:   11-23-15: glucose 61; bun 21; creat 1.41; k+ 3.7; na++144; phos 4.7; pth 67; ca++9.2  01-24-16: wbc 4.2; hgb 12.;4 hct 38.4; mcv 80.3 plt 246; glucose 74; bun 12; creat 1.3; k+ 3.3; na++ 143; liver normal albumin 3.6; chol 145; ldl 78; tri 94; hdl 48; tsh 0.54; hb a1c 6.8;  03-13-16: glucose 101; bun 13; creat 1.35; k+ 2.8; na++141 03-16-16: glucose 61; bun 18; creat 1.35; k+ 3.5; na++ 142 03-28-16: urine culture: e-coli: cipro  04-17-16: hgb a1c 6.3  08-14-16; wbc 5.7 hgb 12.1; hct 36.8 ;mcv 80.3; plt 229; glucose  107; bun 20; creat 1.49; k+ 4.1; na++ 140; liver  normal albumin 3.3; chol 153; ldl 87; trig 135; hdl 39; hgb a1c 6.7; urine micro-albumin 1.3      Review of Systems Constitutional: Negative for appetite change and fatigue.  HENT: Negative for congestion.   Respiratory: Negative for cough, chest tightness and shortness of breath.   Cardiovascular: Negative for chest pain, palpitations and leg swelling.  Gastrointestinal: Negative for nausea, abdominal pain, diarrhea and constipation.  Musculoskeletal: Negative for myalgias and arthralgias.  Skin: Negative for pallor.  Neurological: Negative for dizziness.  Psychiatric/Behavioral: The patient is not nervous/anxious.       Physical Exam Constitutional: No distress.  Eyes: Conjunctivae are normal.  Neck: Neck supple. No JVD present. No thyromegaly present.  Cardiovascular: Normal rate, regular rhythm and intact distal pulses.   Respiratory: Effort normal and breath sounds normal. No respiratory distress. She has no wheezes.  GI: Soft. Bowel sounds are normal. She exhibits no distension. There is no tenderness.  Has colostomy   Musculoskeletal: She exhibits no edema.  Able to move all extremities   Lymphadenopathy:    She has no cervical adenopathy.  Neurological: She is alert.  Skin: Skin is warm and dry. She is not diaphoretic.  Psychiatric: She has a normal mood and affect.      ASSESSMENT/ PLAN:  1. Hypertension: will continue norvasc 10 mg daily;  hydralazine 25 mg twice daily  coreg  6.25 mg twice daily cozaar  50 mg twice daily   2. Dyslipidemia: will continue lipitor  80 mg daily  ldl is 87  3. Diabetes: her glipizide was stopped due to low cbg readings in the am;  hgb a1c is 6.7; will not make changes will monitor her status.   4. COPD: is stable  will continue prednisone 5 mg daily zyrtec 10 mg daily  and will monitor her status.   5. CAD: history of MI X3;  no complaints of chest pain present will continue imdur 60 mg daily; asa 81 mg daily and prn ntg; will  monitor  6. Jerrye Bushy: will continue  prilosec 20 mg daily    7. Constipation: will continue colace three times daily; and senna s 2 tabs daily   8. Depression: will continue paxil 30 mg daily. Take trazodone 50 mg nightly   9.  CVA: is neurologically stable; will continue asa 81 mg daily   10. Vascular dementia: on change in her status; is presently not on medications; will not make changes will monitor her status.   11. Diastolic heart failure: will continue  hydralazine 25 mg twice daily imdur 60 mg daily  Will continue coreg  6.25 mg twice daily and will monitor her status.   12. CKD stage III: bun/creat: 20/1.49; will continue calcitriol 0.25 mcg daily   13.  adenocarcinoma of rectum: status post colostomy (2015)  Ok Edwards NP [facility]  Contact [phone] Monday through Friday 8am- 5pm  After hours call [phone]

## 2016-12-02 DIAGNOSIS — E559 Vitamin D deficiency, unspecified: Secondary | ICD-10-CM | POA: Diagnosis not present

## 2016-12-02 DIAGNOSIS — Z79899 Other long term (current) drug therapy: Secondary | ICD-10-CM | POA: Diagnosis not present

## 2017-03-04 DIAGNOSIS — E785 Hyperlipidemia, unspecified: Secondary | ICD-10-CM | POA: Diagnosis not present

## 2017-03-04 DIAGNOSIS — E119 Type 2 diabetes mellitus without complications: Secondary | ICD-10-CM | POA: Diagnosis not present

## 2017-03-04 DIAGNOSIS — D649 Anemia, unspecified: Secondary | ICD-10-CM | POA: Diagnosis not present

## 2017-03-04 LAB — HEPATIC FUNCTION PANEL
ALT: 12 U/L (ref 7–35)
AST: 14 U/L (ref 13–35)
Alkaline Phosphatase: 76 U/L (ref 25–125)
BILIRUBIN, TOTAL: 0.4 mg/dL

## 2017-03-04 LAB — CBC AND DIFFERENTIAL
HCT: 41 % (ref 36–46)
Hemoglobin: 13.3 g/dL (ref 12.0–16.0)
Neutrophils Absolute: 4 /uL
Platelets: 208 10*3/uL (ref 150–399)
WBC: 6.2 10^3/mL

## 2017-03-04 LAB — LIPID PANEL
Cholesterol: 154 mg/dL (ref 0–200)
HDL: 45 mg/dL (ref 35–70)
LDL Cholesterol: 87 mg/dL
TRIGLYCERIDES: 108 mg/dL (ref 40–160)

## 2017-03-04 LAB — HEMOGLOBIN A1C: HEMOGLOBIN A1C: 8

## 2017-03-04 LAB — BASIC METABOLIC PANEL
BUN: 16 mg/dL (ref 4–21)
CREATININE: 1.1 mg/dL (ref <=1.1)
GLUCOSE: 103 mg/dL
Potassium: 3.7 mmol/L (ref 3.4–5.3)
SODIUM: 145 mmol/L (ref 137–147)

## 2017-03-09 ENCOUNTER — Non-Acute Institutional Stay (SKILLED_NURSING_FACILITY): Payer: Medicare Other

## 2017-03-09 DIAGNOSIS — Z0000 Encounter for general adult medical examination without abnormal findings: Secondary | ICD-10-CM

## 2017-03-09 NOTE — Progress Notes (Signed)
Subjective:   Sheila Flynn is a 71 y.o. female who presents for an Initial Medicare Annual Wellness Visit at [facility]   Cardiac Risk Factors include: advanced age (>39men, >18 women);diabetes mellitus;dyslipidemia;hypertension;smoking/ tobacco exposure     Objective:    Today's Vitals   03/09/17 1326  BP: 120/68  Pulse: 82  Temp: 98.3 F (36.8 C)  TempSrc: Oral  SpO2: 95%  Weight: 164 lb (74.4 kg)  Height: 5\' 7"  (1.702 m)   Body mass index is 25.69 kg/m.   Current Medications (verified) Outpatient Encounter Prescriptions as of 03/09/2017  Medication Sig  . acetaminophen (TYLENOL) 325 MG tablet Take 650 mg by mouth every 6 (six) hours as needed for mild pain.  Marland Kitchen amLODipine (NORVASC) 10 MG tablet Take 10 mg by mouth daily.  . Artificial Tear Solution (TEARS PURE) 0.1-0.3 % SOLN Apply 1 drop to eye every 8 (eight) hours as needed.  Marland Kitchen aspirin (ASPIRIN EC) 81 MG EC tablet Take 81 mg by mouth daily. Swallow whole.  Marland Kitchen atorvastatin (LIPITOR) 80 MG tablet Take 1 tablet (80 mg total) by mouth daily.  . calcitRIOL (ROCALTROL) 0.25 MCG capsule Take 0.25 mcg by mouth daily.  . carvedilol (COREG) 6.25 MG tablet Take 6.25 mg by mouth 2 (two) times daily with a meal.  . cetirizine (ZYRTEC) 10 MG tablet Take 10 mg by mouth daily.  . Cholecalciferol 50000 units TABS Take 50,000 Units by mouth. Take one tablet by mouth one time a day  Starting on the 14 th and ending on the 14 th every month.  . docusate sodium (COLACE) 100 MG capsule Take 100 mg by mouth 3 (three) times daily.  . hydrALAZINE (APRESOLINE) 25 MG tablet Take 25 mg by mouth 2 (two) times daily.   . isosorbide mononitrate (ISMO,MONOKET) 20 MG tablet Take 60 mg by mouth daily.   Marland Kitchen losartan (COZAAR) 50 MG tablet Take 50 mg by mouth 2 (two) times daily.  . nitroGLYCERIN (NITROSTAT) 0.4 MG SL tablet Place 0.4 mg under the tongue every 5 (five) minutes as needed for chest pain.  Marland Kitchen omeprazole (PRILOSEC) 20  MG capsule Take 20 mg by mouth daily.   Marland Kitchen PARoxetine (PAXIL) 20 MG tablet Take by mouth. Take 1.5 tablet by mouth daily.  . potassium chloride SA (K-DUR,KLOR-CON) 20 MEQ tablet Take 20 mEq by mouth daily.  . prednisoLONE 5 MG TABS tablet Take 5 mg by mouth daily. Reported on 01/23/2016  . sennosides-docusate sodium (SENOKOT-S) 8.6-50 MG tablet Take 2 tablets by mouth every evening.   . traZODone (DESYREL) 50 MG tablet Take 50 mg by mouth at bedtime.   No facility-administered encounter medications on file as of 03/09/2017.     Allergies (verified) Ace inhibitors   History: Past Medical History:  Diagnosis Date  . Acute respiratory failure ([place]) 12/21/10  . Angina at rest [facility])   . Aspiration pneumonia ([place])   . CAD (coronary artery disease)    Dr. Matthew Saras  . Chronic constipation   . Chronic diastolic CHF (congestive heart failure) ([place])   . CKD (chronic kidney disease)   . COPD (chronic obstructive pulmonary disease) ([place])   . Delirium, induced by drug ([place])    polypharmacy  . Dementia   . 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 Fall    history of falls  . GERD (gastroesophageal reflux disease)   . Hyperlipidemia  LDL goal < 100   . Major depressive disorder   . 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 Migraine    "monthly" (11/06/2014)  . Myocardial infarction ([place])    "I've had about 3" (11/06/2014)  . On home oxygen therapy    "prn at the nursing home" (11/06/2014)  . Pneumonia 12/21/10  . Pulmonary hypertension ([place])   . Rectal cancer ([place])    s/p colostomy  . Sacral fracture, closed ([place])    s/p kyphoplasty  . Stroke [facility]) 2011   postop  . Stroke [facility]) ?2014   "left side sometimes weaker since" (11/06/2014)  . Thyroid goiter   . Vascular dementia with depressed mood    Past Surgical History:  Procedure Laterality Date    . ABDOMINAL HYSTERECTOMY    . APPENDECTOMY    . COLON SURGERY    . COLOSTOMY     rectal ca  . KYPHOPLASTY     sacrum  . right cataract  11-19-2015  . TONSILLECTOMY    . TUBAL LIGATION     History reviewed. No pertinent family history. Social History   Occupational History  .  Unemployed    custodial   Social History Main Topics  . Smoking status: Former Smoker    Packs/day: 0.50    Years: 2.00    Types: Cigarettes  . Smokeless tobacco: Never Used     Comment: "quit smoking cigarettes in 1969"  . Alcohol use No  . Drug use: Yes     Comment: previously smoked crack, has had positive UDS for amphetamines and narcotics  . Sexual activity: No    Tobacco Counseling Counseling given: Not Answered   Activities of Daily Living In your present state of health, do you have any difficulty performing the following activities: 03/09/2017  Hearing? N  Vision? N  Difficulty concentrating or making decisions? N  Walking or climbing stairs? Y  Dressing or bathing? Y  Doing errands, shopping? Y  Preparing Food and eating ? Y  Using the Toilet? Y  In the past six months, have you accidently leaked urine? Y  Do you have problems with loss of bowel control? Y  Managing your Medications? Y  Managing your Finances? Y  Housekeeping or managing your Housekeeping? Y  Some recent data might be hidden    Immunizations and Health Maintenance Immunization History  Administered Date(s) Administered  . Influenza-Unspecified 08/09/2015   Health Maintenance Due  Topic Date Due  . MAMMOGRAM  12/09/2014  . OPHTHALMOLOGY EXAM  11/09/2016  . FOOT EXAM  01/01/2017    Patient Care Team: Gildardo Cranker, DO as PCP - General (Internal Medicine) [facility]  Indicate any recent Medical Services you may have received from other than [facility] providers in the past year (date may be approximate).     Assessment:   This is a routine wellness examination  for Sheila Flynn.   Hearing/Vision screen No exam data present  Dietary issues and exercise activities discussed: Current Exercise Habits: The patient does not participate in regular exercise at present, Exercise limited by: orthopedic condition(s)  Goals    . Maintain LIfestyle          Starting 03/09/2017 pt is going to maintain her lifestyle.      Depression Screen PHQ 2/9 Scores 03/09/2017  PHQ - 2 Score 0    Fall Risk Fall Risk  03/09/2017  Falls in the past year? Yes  Number falls in past yr: 2 or more  Injury with Fall? No    Cognitive Function: BIMS done on 4/6. 15/15        Screening Tests Health Maintenance  Topic Date Due  . MAMMOGRAM  12/09/2014  . OPHTHALMOLOGY EXAM  11/09/2016  . FOOT EXAM  01/01/2017  . Hepatitis C Screening  11/09/2026 (Originally [birth year])  . PNA vac Low Risk Adult (1 of 2 - PCV13) 11/09/2026 (Originally 04/23/2012)  . INFLUENZA VACCINE  06/09/2017  . HEMOGLOBIN A1C  09/03/2017  . COLONOSCOPY  12/21/2020  . TETANUS/TDAP  12/10/2024  . DEXA SCAN  Completed      Plan:    I have personally reviewed and addressed the Medicare Annual Wellness questionnaire and have noted the following in the patient's chart:  A. Medical and social history B. Use of alcohol, tobacco or illicit drugs  C. Current medications and supplements D. Functional ability and status E.  Nutritional status F.  Physical activity G. Advance directives H. List of other physicians I.  Hospitalizations, surgeries, and ER visits in previous 12 months J.  [place] to include hearing, vision, cognitive, depression L. Referrals and appointments - none  In addition, I have reviewed and discussed with patient certain preventive protocols, quality metrics, and best practice recommendations. A written personalized care plan for preventive services as well as general preventive health recommendations were provided to patient.  See attached scanned questionnaire for  additional information.   Signed,   Rich Reining, RN Nurse Health Advisor

## 2017-03-09 NOTE — Patient Instructions (Signed)
Ms. Sheila Flynn , Thank you for taking time to come for your Medicare Wellness Visit. I appreciate your ongoing commitment to your health goals. Please review the following plan we discussed and let me know if I can assist you in the future.   Screening recommendations/referrals: Colonoscopy up to date. Due 11/23/2026 Mammogram due. Bone Density up to date Recommended yearly ophthalmology/optometry visit for glaucoma screening and checkup Recommended yearly dental visit for hygiene and checkup  Vaccinations: Influenza vaccine up to date Pneumococcal vaccine due. PN13 ordered Tdap vaccine due. Shingles vaccine not in records.    Advanced directives: In Chart  Conditions/risks identified: None  Next appointment: none upcoming   Preventive Care 65 Years and Older, Female Preventive care refers to lifestyle choices and visits with your health care provider that can promote health and wellness. What does preventive care include?  A yearly physical exam. This is also called an annual well check.  Dental exams once or twice a year.  Routine eye exams. Ask your health care provider how often you should have your eyes checked.  Personal lifestyle choices, including:  Daily care of your teeth and gums.  Regular physical activity.  Eating a healthy diet.  Avoiding tobacco and drug use.  Limiting alcohol use.  Practicing safe sex.  Taking low-dose aspirin every day.  Taking vitamin and mineral supplements as recommended by your health care provider. What happens during an annual well check? The services and screenings done by your health care provider during your annual well check will depend on your age, overall health, lifestyle risk factors, and family history of disease. Counseling  Your health care provider may ask you questions about your:  Alcohol use.  Tobacco use.  Drug use.  Emotional well-being.  Home and relationship well-being.  Sexual activity.  Eating  habits.  History of falls.  Memory and ability to understand (cognition).  Work and work Statistician.  Reproductive health. Screening  You may have the following tests or measurements:  Height, weight, and BMI.  Blood pressure.  Lipid and cholesterol levels. These may be checked every 5 years, or more frequently if you are over 50 years old.  Skin check.  Lung cancer screening. You may have this screening every year starting at age 28 if you have a 30-pack-year history of smoking and currently smoke or have quit within the past 15 years.  Fecal occult blood test (FOBT) of the stool. You may have this test every year starting at age 53.  Flexible sigmoidoscopy or colonoscopy. You may have a sigmoidoscopy every 5 years or a colonoscopy every 10 years starting at age 69.  Hepatitis C blood test.  Hepatitis B blood test.  Sexually transmitted disease (STD) testing.  Diabetes screening. This is done by checking your blood sugar (glucose) after you have not eaten for a while (fasting). You may have this done every 1-3 years.  Bone density scan. This is done to screen for osteoporosis. You may have this done starting at age 40.  Mammogram. This may be done every 1-2 years. Talk to your health care provider about how often you should have regular mammograms. Talk with your health care provider about your test results, treatment options, and if necessary, the need for more tests. Vaccines  Your health care provider may recommend certain vaccines, such as:  Influenza vaccine. This is recommended every year.  Tetanus, diphtheria, and acellular pertussis (Tdap, Td) vaccine. You may need a Td booster every 10 years.  Zoster vaccine.  You may need this after age 52.  Pneumococcal 13-valent conjugate (PCV13) vaccine. One dose is recommended after age 72.  Pneumococcal polysaccharide (PPSV23) vaccine. One dose is recommended after age 18. Talk to your health care provider about which  screenings and vaccines you need and how often you need them. This information is not intended to replace advice given to you by your health care provider. Make sure you discuss any questions you have with your health care provider. Document Released: 11/22/2015 Document Revised: 07/15/2016 Document Reviewed: 08/27/2015 Elsevier Interactive Patient Education  2017 [place] Prevention in the Home Falls can cause injuries. They can happen to people of all ages. There are many things you can do to make your home safe and to help prevent falls. What can I do on the outside of my home?  Regularly fix the edges of walkways and driveways and fix any cracks.  Remove anything that might make you trip as you walk through a door, such as a raised step or threshold.  Trim any bushes or trees on the path to your home.  Use bright outdoor lighting.  Clear any walking paths of anything that might make someone trip, such as rocks or tools.  Regularly check to see if handrails are loose or broken. Make sure that both sides of any steps have handrails.  Any raised decks and porches should have guardrails on the edges.  Have any leaves, snow, or ice cleared regularly.  Use sand or salt on walking paths during winter.  Clean up any spills in your garage right away. This includes oil or grease spills. What can I do in the bathroom?  Use night lights.  Install grab bars by the toilet and in the tub and shower. Do not use towel bars as grab bars.  Use non-skid mats or decals in the tub or shower.  If you need to sit down in the shower, use a plastic, non-slip stool.  Keep the floor dry. Clean up any water that spills on the floor as soon as it happens.  Remove soap buildup in the tub or shower regularly.  Attach bath mats securely with double-sided non-slip rug tape.  Do not have throw rugs and other things on the floor that can make you trip. What can I do in the bedroom?  Use  night lights.  Make sure that you have a light by your bed that is easy to reach.  Do not use any sheets or blankets that are too big for your bed. They should not hang down onto the floor.  Have a firm chair that has side arms. You can use this for support while you get dressed.  Do not have throw rugs and other things on the floor that can make you trip. What can I do in the kitchen?  Clean up any spills right away.  Avoid walking on wet floors.  Keep items that you use a lot in easy-to-reach places.  If you need to reach something above you, use a strong step stool that has a grab bar.  Keep electrical cords out of the way.  Do not use floor polish or wax that makes floors slippery. If you must use wax, use non-skid floor wax.  Do not have throw rugs and other things on the floor that can make you trip. What can I do with my stairs?  Do not leave any items on the stairs.  Make sure that there are handrails on  both sides of the stairs and use them. Fix handrails that are broken or loose. Make sure that handrails are as long as the stairways.  Check any carpeting to make sure that it is firmly attached to the stairs. Fix any carpet that is loose or worn.  Avoid having throw rugs at the top or bottom of the stairs. If you do have throw rugs, attach them to the floor with carpet tape.  Make sure that you have a light switch at the top of the stairs and the bottom of the stairs. If you do not have them, ask someone to add them for you. What else can I do to help prevent falls?  Wear shoes that:  Do not have high heels.  Have rubber bottoms.  Are comfortable and fit you well.  Are closed at the toe. Do not wear sandals.  If you use a stepladder:  Make sure that it is fully opened. Do not climb a closed stepladder.  Make sure that both sides of the stepladder are locked into place.  Ask someone to hold it for you, if possible.  Clearly mark and make sure that you  can see:  Any grab bars or handrails.  First and last steps.  Where the edge of each step is.  Use tools that help you move around (mobility aids) if they are needed. These include:  Canes.  Walkers.  Scooters.  Crutches.  Turn on the lights when you go into a dark area. Replace any light bulbs as soon as they burn out.  Set up your furniture so you have a clear path. Avoid moving your furniture around.  If any of your floors are uneven, fix them.  If there are any pets around you, be aware of where they are.  Review your medicines with your doctor. Some medicines can make you feel dizzy. This can increase your chance of falling. Ask your doctor what other things that you can do to help prevent falls. This information is not intended to replace advice given to you by your health care provider. Make sure you discuss any questions you have with your health care provider. Document Released: 08/22/2009 Document Revised: 04/02/2016 Document Reviewed: 11/30/2014 Elsevier Interactive Patient Education  2017 [employer].

## 2017-03-09 NOTE — Progress Notes (Signed)
Quick Notes   Health Maintenance: Due for HgA1C, Hep C, and TDAP. Eye exam due. Order put in for Pn13     Abnormal Screen: None     Patient Concerns: None     Nurse Concerns: None

## 2017-03-10 ENCOUNTER — Non-Acute Institutional Stay (SKILLED_NURSING_FACILITY): Payer: Medicare Other | Admitting: Internal Medicine

## 2017-03-10 ENCOUNTER — Encounter: Payer: Self-pay | Admitting: Internal Medicine

## 2017-03-10 DIAGNOSIS — E785 Hyperlipidemia, unspecified: Secondary | ICD-10-CM

## 2017-03-10 DIAGNOSIS — E1169 Type 2 diabetes mellitus with other specified complication: Secondary | ICD-10-CM

## 2017-03-10 DIAGNOSIS — E1149 Type 2 diabetes mellitus with other diabetic neurological complication: Secondary | ICD-10-CM | POA: Diagnosis not present

## 2017-03-10 NOTE — Assessment & Plan Note (Signed)
Prednisolone will be weaned slowly as there is no definite indication for its use and potential adverse effects such as worsening diabetes would outweigh positive benefit. Add low-dose metformin after the largest meal.

## 2017-03-10 NOTE — Patient Instructions (Signed)
See assessment and plan under each diagnosis in the problem list and acutely for this visit 

## 2017-03-10 NOTE — Progress Notes (Signed)
Patient ID: Sheila Flynn, female   DOB: Apr 30, 1946, 71 y.o.   MRN: [id number]   This is a nursing facility follow up of chronic medical diagnoses  Interim medical record and care since last [facility] visit was updated with review of diagnostic studies and change in clinical status since last visit were documented.  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 Labs are current. On 03/04/17 renal function was normal. Creatinine had dropped from 1.5 in October 2017 down to 1.1. LDL was at minimal goal of less than 100, although ideal would be less than 70. HDL was mildly reduced at 45. These values were on a high dose of atorvastatin, 80 mg daily. A1c indicated adequate control at 8%. This is context of prednisolone 5 mg daily; the indication for this drug is not apparent on chart review. The patient has no diabetic medications  listed. A1c has risen steadily from a value of 6.3% in June 2017 to 6.7% in October.  Review of systems: The patient is nonverbal with only garbled unintelligible speech. She was able to follow simple commands. Her only validated positive review of system was itching. She had no other extrinsic symptoms. She specifically denies any cardiopulmonary, GI, GU, or endocrinologic symptoms related to diabetes.  Constitutional: No fever,significant weight change, fatigue  Eyes: No redness, discharge, pain, vision change ENT/mouth: No nasal congestion,  purulent discharge, earache,change in hearing ,sore throat  Cardiovascular: No chest pain, palpitations,paroxysmal nocturnal dyspnea, claudication, edema  Respiratory: No cough, sputum production,hemoptysis, DOE , significant snoring,apnea  Gastrointestinal: No  heartburn,dysphagia,abdominal pain, nausea / vomiting,rectal bleeding, melena,change in bowels Genitourinary: No dysuria,hematuria, pyuria,  incontinence, nocturia Musculoskeletal: No joint stiffness, joint swelling, weakness,pain Dermatologic: No rash, pruritus, change in appearance of skin Neurologic: No dizziness,headache,syncope, seizures, numbness , tingling Psychiatric: No significant anxiety , depression, insomnia, anorexia Endocrine: No change in hair/skin/ nails, excessive thirst, excessive hunger, excessive urination  Hematologic/lymphatic: No significant bruising, lymphadenopathy,abnormal bleeding Allergy/immunology: No itchy/ watery eyes, significant sneezing, urticaria, angioedema  Physical exam:  Pertinent or positive findings: She is a wheelchair which she propels. As noted she's nonverbal. She has ptosis on the left. She has an upper dental plate. The thyroid is full and slightly irregular. Grade 1/2 systolic murmur is noted at the base. Breath sounds are decreased. She is wearing very long artificial  fingernails. Clubbing or cyanosis cannot be evaluated. Pedal pulses are decreased. Strength appears to be essentially symmetric in all limbs  General appearance:Adequately nourished; no acute distress , increased work of breathing is present.   Lymphatic: No lymphadenopathy about the head, neck, axilla . Eyes: No conjunctival inflammation or lid edema is present. There is no scleral icterus. Ears:  External ear exam shows no significant lesions or deformities.   Nose:  External nasal examination shows no deformity or inflammation. Nasal mucosa are pink and moist without lesions ,exudates Oral exam: lips and gums are healthy appearing.There is no oropharyngeal erythema or exudate . Neck:  No  masses, tenderness noted.    Heart:  Normal rate and regular rhythm. S1 and S2 normal without gallop, click, rub .  Lungs: without wheezes, rhonchi,rales , rubs. Abdomen:Bowel sounds are  normal. Abdomen is soft and nontender with no organomegaly, hernias,masses. GU: deferred  Extremities:  No edema  Neurologic exam : Balance,Rhomberg,finger to nose  testing could not be completed due to clinical state Deep tendon reflexes are equal Skin: Warm & dry w/o tenting. No significant lesions or rash.  See summary under each active problem in the Problem List with associated updated therapeutic plan as well as for each new diagnosis @ this visit

## 2017-03-10 NOTE — Assessment & Plan Note (Signed)
Current lipid values are essentially at goal. Atorvastatin, a strong statin, is being administered at a very high dose. The only future option would be possibly changing to generic Crestor 40 mg daily as a trial if not cost prohibitive.

## 2017-03-29 DIAGNOSIS — E1129 Type 2 diabetes mellitus with other diabetic kidney complication: Secondary | ICD-10-CM | POA: Diagnosis not present

## 2017-03-29 DIAGNOSIS — F329 Major depressive disorder, single episode, unspecified: Secondary | ICD-10-CM | POA: Diagnosis not present

## 2017-03-29 DIAGNOSIS — I2510 Atherosclerotic heart disease of native coronary artery without angina pectoris: Secondary | ICD-10-CM | POA: Diagnosis not present

## 2017-03-29 DIAGNOSIS — E118 Type 2 diabetes mellitus with unspecified complications: Secondary | ICD-10-CM | POA: Diagnosis not present

## 2017-03-29 DIAGNOSIS — N2581 Secondary hyperparathyroidism of renal origin: Secondary | ICD-10-CM | POA: Diagnosis not present

## 2017-03-29 DIAGNOSIS — E049 Nontoxic goiter, unspecified: Secondary | ICD-10-CM | POA: Diagnosis not present

## 2017-03-29 DIAGNOSIS — F0390 Unspecified dementia without behavioral disturbance: Secondary | ICD-10-CM | POA: Diagnosis not present

## 2017-03-29 DIAGNOSIS — D631 Anemia in chronic kidney disease: Secondary | ICD-10-CM | POA: Diagnosis not present

## 2017-03-29 DIAGNOSIS — N183 Chronic kidney disease, stage 3 (moderate): Secondary | ICD-10-CM | POA: Diagnosis not present

## 2017-03-29 DIAGNOSIS — I129 Hypertensive chronic kidney disease with stage 1 through stage 4 chronic kidney disease, or unspecified chronic kidney disease: Secondary | ICD-10-CM | POA: Diagnosis not present

## 2017-03-29 DIAGNOSIS — I272 Pulmonary hypertension, unspecified: Secondary | ICD-10-CM | POA: Diagnosis not present

## 2017-03-29 DIAGNOSIS — E782 Mixed hyperlipidemia: Secondary | ICD-10-CM | POA: Diagnosis not present

## 2017-08-17 DIAGNOSIS — L603 Nail dystrophy: Secondary | ICD-10-CM | POA: Diagnosis not present

## 2017-08-17 DIAGNOSIS — B351 Tinea unguium: Secondary | ICD-10-CM | POA: Diagnosis not present

## 2017-08-17 DIAGNOSIS — Z7984 Long term (current) use of oral hypoglycemic drugs: Secondary | ICD-10-CM | POA: Diagnosis not present

## 2017-08-17 DIAGNOSIS — E1140 Type 2 diabetes mellitus with diabetic neuropathy, unspecified: Secondary | ICD-10-CM | POA: Diagnosis not present

## 2017-08-23 ENCOUNTER — Other Ambulatory Visit (HOSPITAL_COMMUNITY): Payer: Self-pay | Admitting: Internal Medicine

## 2017-08-23 DIAGNOSIS — R1319 Other dysphagia: Secondary | ICD-10-CM

## 2017-08-24 ENCOUNTER — Other Ambulatory Visit (HOSPITAL_COMMUNITY): Payer: Self-pay | Admitting: Family Medicine

## 2017-08-24 DIAGNOSIS — R229 Localized swelling, mass and lump, unspecified: Principal | ICD-10-CM

## 2017-08-24 DIAGNOSIS — R9389 Abnormal findings on diagnostic imaging of other specified body structures: Secondary | ICD-10-CM

## 2017-08-24 DIAGNOSIS — IMO0002 Reserved for concepts with insufficient information to code with codable children: Secondary | ICD-10-CM

## 2017-08-26 ENCOUNTER — Ambulatory Visit (HOSPITAL_COMMUNITY)
Admission: RE | Admit: 2017-08-26 | Discharge: 2017-08-26 | Disposition: A | Discharge status: Home / Self Care | Payer: Medicare Other | Source: Ambulatory Visit | Attending: Internal Medicine | Admitting: Internal Medicine

## 2017-08-26 DIAGNOSIS — F0280 Dementia in other diseases classified elsewhere without behavioral disturbance: Secondary | ICD-10-CM | POA: Diagnosis not present

## 2017-08-26 DIAGNOSIS — K219 Gastro-esophageal reflux disease without esophagitis: Secondary | ICD-10-CM | POA: Insufficient documentation

## 2017-08-26 DIAGNOSIS — J449 Chronic obstructive pulmonary disease, unspecified: Secondary | ICD-10-CM | POA: Insufficient documentation

## 2017-08-26 DIAGNOSIS — R1319 Other dysphagia: Secondary | ICD-10-CM

## 2017-08-26 DIAGNOSIS — E785 Hyperlipidemia, unspecified: Secondary | ICD-10-CM | POA: Insufficient documentation

## 2017-08-26 DIAGNOSIS — N183 Chronic kidney disease, stage 3 (moderate): Secondary | ICD-10-CM | POA: Insufficient documentation

## 2017-08-26 DIAGNOSIS — I5032 Chronic diastolic (congestive) heart failure: Secondary | ICD-10-CM | POA: Insufficient documentation

## 2017-08-26 DIAGNOSIS — I130 Hypertensive heart and chronic kidney disease with heart failure and stage 1 through stage 4 chronic kidney disease, or unspecified chronic kidney disease: Secondary | ICD-10-CM | POA: Diagnosis not present

## 2017-08-31 ENCOUNTER — Ambulatory Visit (HOSPITAL_COMMUNITY)
Admission: RE | Admit: 2017-08-31 | Discharge: 2017-08-31 | Disposition: A | Discharge status: Home / Self Care | Payer: Medicare Other | Source: Ambulatory Visit | Attending: Family Medicine | Admitting: Family Medicine

## 2017-08-31 DIAGNOSIS — I700 Atherosclerosis of aorta: Secondary | ICD-10-CM | POA: Insufficient documentation

## 2017-08-31 DIAGNOSIS — G4700 Insomnia, unspecified: Secondary | ICD-10-CM | POA: Diagnosis not present

## 2017-08-31 DIAGNOSIS — I517 Cardiomegaly: Secondary | ICD-10-CM | POA: Insufficient documentation

## 2017-08-31 DIAGNOSIS — I313 Pericardial effusion (noninflammatory): Secondary | ICD-10-CM | POA: Insufficient documentation

## 2017-08-31 DIAGNOSIS — F329 Major depressive disorder, single episode, unspecified: Secondary | ICD-10-CM | POA: Diagnosis not present

## 2017-08-31 DIAGNOSIS — N2889 Other specified disorders of kidney and ureter: Secondary | ICD-10-CM | POA: Diagnosis not present

## 2017-08-31 DIAGNOSIS — J9859 Other diseases of mediastinum, not elsewhere classified: Secondary | ICD-10-CM | POA: Insufficient documentation

## 2017-08-31 DIAGNOSIS — R9389 Abnormal findings on diagnostic imaging of other specified body structures: Secondary | ICD-10-CM

## 2017-08-31 DIAGNOSIS — E042 Nontoxic multinodular goiter: Secondary | ICD-10-CM | POA: Insufficient documentation

## 2017-08-31 LAB — POCT I-STAT CREATININE: Creatinine, Ser: 1.2 mg/dL — ABNORMAL HIGH (ref 0.44–1.00)

## 2017-08-31 MED ORDER — IOPAMIDOL (ISOVUE-300) INJECTION 61%
INTRAVENOUS | Status: AC
  Administered 2017-08-31: 75 mL
  Filled 2017-08-31: qty 75

## 2017-09-22 ENCOUNTER — Other Ambulatory Visit: Payer: Self-pay | Admitting: Urology

## 2017-09-22 DIAGNOSIS — D49512 Neoplasm of unspecified behavior of left kidney: Secondary | ICD-10-CM

## 2017-10-21 ENCOUNTER — Ambulatory Visit (HOSPITAL_COMMUNITY)
Admission: RE | Admit: 2017-10-21 | Discharge: 2017-10-21 | Disposition: A | Discharge status: Home / Self Care | Payer: Medicare Other | Source: Ambulatory Visit | Attending: Urology | Admitting: Urology

## 2017-10-21 DIAGNOSIS — J90 Pleural effusion, not elsewhere classified: Secondary | ICD-10-CM | POA: Diagnosis not present

## 2017-10-21 DIAGNOSIS — I517 Cardiomegaly: Secondary | ICD-10-CM | POA: Insufficient documentation

## 2017-10-21 DIAGNOSIS — I700 Atherosclerosis of aorta: Secondary | ICD-10-CM | POA: Insufficient documentation

## 2017-10-21 DIAGNOSIS — D49512 Neoplasm of unspecified behavior of left kidney: Secondary | ICD-10-CM | POA: Insufficient documentation

## 2017-10-21 LAB — POCT I-STAT CREATININE: Creatinine, Ser: 1.4 mg/dL — ABNORMAL HIGH (ref 0.44–1.00)

## 2017-10-21 MED ORDER — GADOBENATE DIMEGLUMINE 529 MG/ML IV SOLN
15.0000 mL | Freq: Once | INTRAVENOUS | Status: AC | PRN
  Administered 2017-10-21: 15 mL via INTRAVENOUS

## 2017-11-04 NOTE — Addendum Note (Signed)
Encounter addended by: Esperanza Richters, [place] [facility] on: 11/04/2017 4:56 PM  Actions taken: Flowsheet data copied forward, Sign clinical note, Flowsheet accepted, Actions taken from a BestPractice Advisory

## 2017-11-04 NOTE — Progress Notes (Signed)
Speech Pathology: late entry from 08/26/17  08/26/17 1300  SLP Visit Information  SLP Received On 08/26/17  Subjective  Subjective alert, followed commands  General Information  Date of Onset 08/26/17  HPI 71 y.o. female resident of [facility] referred for OP MBS.  Hx of ARF, dementia, MI x 3, depressive disorder, CVA 2011 and 2014.  Last OP MBS 12/27/14 - mild oral and pharyngeal dysphagia with decreased bolus cohesion, flash penetration of thin liquids.  Per recent report, pt has had choking episode; new upper dentures that pt chooses not to use; weight loss.  Type of Study MBS-Modified Barium Swallow Study  Previous Swallow Assessment see HPI  Diet Prior to this Study Regular;Thin liquids  Temperature Spikes Noted No  Respiratory Status Room air  History of Recent Intubation No  Behavior/Cognition Alert  Oral Cavity Assessment WFL  Oral Care Completed by SLP No  Vision Functional for self feeding  Self-Feeding Abilities Able to feed self  Patient Positioning Upright in chair  Baseline Vocal Quality Low vocal intensity  Volitional Swallow Able to elicit  Anatomy [facility]  Pharyngeal Secretions Not observed secondary MBS  Oral Motor/Sensory Function  Overall Oral Motor/Sensory Function WFL  Oral Preparation/Oral Phase  Oral Phase WFL  Pharyngeal Phase  Pharyngeal Phase WFL  Cervical Esophageal Phase  Cervical Esophageal Phase [facility]  Clinical Impression  Clinical Impression Pt presents with adequate/effective mastication and bolus control of solids and thin liquids.  Pharyngeal swallow triggers when thin liquids reach the level of the pyriforms - this leads to brief penetration of liquids into the laryngeal vestibule, but material is ejected from larynx upon descent of the larynx/completion of the swallow.  (#2 on the Penetration/Aspiration Scale).  Penetration did not reach level of vocal folds; there was no aspiration.   13 mm barium pill was swallowed without difficulty.  There was  good pharyngeal clearance of all POs with no residue post-swallow.  No coughing/throat-clearing observed during exam.  Based on today's findings, under controlled conditions, recommend continuing regular diet/thin liquids. However, pt's functional performance while eating meals may look altered - recommend primary SLP at SNF for f/u to ensure safety.   SLP Visit Diagnosis Dysphagia, unspecified (R13.10)  Impact on safety and function No limitations  Swallow Evaluation Recommendations  SLP Diet Recommendations Regular solids;Thin liquid  Liquid Administration via Cup;Straw  Medication Administration Whole meds with liquid  Supervision Patient able to self feed  Treatment Plan  Oral Care Recommendations Oral care BID  Treatment Recommendations Defer treatment plan to f/u with SLP  Follow up Recommendations Skilled Nursing facility  Individuals Consulted  Consulted and Agree with Results and Recommendations Patient  SLP Time Calculation  SLP Start Time (ACUTE ONLY) 1100  SLP Stop Time (ACUTE ONLY) 1130  SLP Time Calculation (min) (ACUTE ONLY) 30 min  SLP G-Codes **NOT FOR INPATIENT CLASS**  Functional Assessment Tool Used clinical judgment  Functional Limitations Swallowing  Swallow Current Status (R5188) CI  Swallow Goal Status (C1660) CI  Swallow Discharge Status (Y3016) CI  SLP Evaluations  $ SLP Speech Visit 1 Visit  SLP Evaluations  $Outpatient MBS Swallow 1 Procedure

## 2017-11-29 ENCOUNTER — Emergency Department (HOSPITAL_COMMUNITY)
Admission: EM | Admit: 2017-11-29 | Discharge: 2017-11-30 | Disposition: A | Discharge status: Home / Self Care | Payer: Medicare Other | Attending: Emergency Medicine | Admitting: Emergency Medicine

## 2017-11-29 ENCOUNTER — Emergency Department (HOSPITAL_COMMUNITY): Payer: Medicare Other

## 2017-11-29 ENCOUNTER — Encounter (HOSPITAL_COMMUNITY): Payer: Self-pay

## 2017-11-29 DIAGNOSIS — R55 Syncope and collapse: Secondary | ICD-10-CM | POA: Insufficient documentation

## 2017-11-29 DIAGNOSIS — I5032 Chronic diastolic (congestive) heart failure: Secondary | ICD-10-CM | POA: Diagnosis not present

## 2017-11-29 DIAGNOSIS — E1122 Type 2 diabetes mellitus with diabetic chronic kidney disease: Secondary | ICD-10-CM | POA: Insufficient documentation

## 2017-11-29 DIAGNOSIS — N189 Chronic kidney disease, unspecified: Secondary | ICD-10-CM | POA: Diagnosis not present

## 2017-11-29 DIAGNOSIS — I130 Hypertensive heart and chronic kidney disease with heart failure and stage 1 through stage 4 chronic kidney disease, or unspecified chronic kidney disease: Secondary | ICD-10-CM | POA: Diagnosis not present

## 2017-11-29 DIAGNOSIS — Z79899 Other long term (current) drug therapy: Secondary | ICD-10-CM | POA: Insufficient documentation

## 2017-11-29 DIAGNOSIS — I2510 Atherosclerotic heart disease of native coronary artery without angina pectoris: Secondary | ICD-10-CM | POA: Insufficient documentation

## 2017-11-29 DIAGNOSIS — Z7982 Long term (current) use of aspirin: Secondary | ICD-10-CM | POA: Insufficient documentation

## 2017-11-29 DIAGNOSIS — Z7984 Long term (current) use of oral hypoglycemic drugs: Secondary | ICD-10-CM | POA: Insufficient documentation

## 2017-11-29 DIAGNOSIS — Z87891 Personal history of nicotine dependence: Secondary | ICD-10-CM | POA: Diagnosis not present

## 2017-11-29 DIAGNOSIS — F0390 Unspecified dementia without behavioral disturbance: Secondary | ICD-10-CM | POA: Diagnosis not present

## 2017-11-29 DIAGNOSIS — N3000 Acute cystitis without hematuria: Secondary | ICD-10-CM | POA: Diagnosis not present

## 2017-11-29 DIAGNOSIS — J449 Chronic obstructive pulmonary disease, unspecified: Secondary | ICD-10-CM | POA: Diagnosis not present

## 2017-11-29 LAB — DIFFERENTIAL
BASOS PCT: 0 %
Basophils Absolute: 0 10*3/uL (ref 0.0–0.1)
EOS ABS: 0.2 10*3/uL (ref 0.0–0.7)
Eosinophils Relative: 2 %
Lymphocytes Relative: 16 %
Lymphs Abs: 1 10*3/uL (ref 0.7–4.0)
MONO ABS: 0.4 10*3/uL (ref 0.1–1.0)
Monocytes Relative: 6 %
NEUTROS ABS: 4.7 10*3/uL (ref 1.7–7.7)
Neutrophils Relative %: 76 %

## 2017-11-29 LAB — URINALYSIS, ROUTINE W REFLEX MICROSCOPIC
BILIRUBIN URINE: NEGATIVE
Glucose, UA: NEGATIVE mg/dL
Hgb urine dipstick: NEGATIVE
KETONES UR: NEGATIVE mg/dL
Nitrite: POSITIVE — AB
PH: 5 (ref 5.0–8.0)
Protein, ur: 30 mg/dL — AB
SQUAMOUS EPITHELIAL / LPF: NONE SEEN
Specific Gravity, Urine: 1.019 (ref 1.005–1.030)

## 2017-11-29 LAB — BASIC METABOLIC PANEL
ANION GAP: 9 (ref 5–15)
BUN: 19 mg/dL (ref 6–20)
CHLORIDE: 107 mmol/L (ref 101–111)
CO2: 25 mmol/L (ref 22–32)
Calcium: 9.3 mg/dL (ref 8.9–10.3)
Creatinine, Ser: 1.48 mg/dL — ABNORMAL HIGH (ref 0.44–1.00)
GFR calc non Af Amer: 35 mL/min — ABNORMAL LOW (ref >60)
GFR, EST AFRICAN AMERICAN: 40 mL/min — AB (ref >60)
Glucose, Bld: 120 mg/dL — ABNORMAL HIGH (ref 65–99)
POTASSIUM: 3.5 mmol/L (ref 3.5–5.1)
SODIUM: 141 mmol/L (ref 135–145)

## 2017-11-29 LAB — CBC
HEMATOCRIT: 31.8 % — AB (ref 36.0–46.0)
Hemoglobin: 10 g/dL — ABNORMAL LOW (ref 12.0–15.0)
MCH: 26.7 pg (ref 26.0–34.0)
MCHC: 31.4 g/dL (ref 30.0–36.0)
MCV: 84.8 fL (ref 78.0–100.0)
Platelets: 218 10*3/uL (ref 150–400)
RBC: 3.75 MIL/uL — AB (ref 3.87–5.11)
RDW: 16.1 % — ABNORMAL HIGH (ref 11.5–15.5)
WBC: 6.2 10*3/uL (ref 4.0–10.5)

## 2017-11-29 LAB — I-STAT TROPONIN, ED: TROPONIN I, POC: 0.01 ng/mL (ref 0.00–0.08)

## 2017-11-29 LAB — HEPATIC FUNCTION PANEL
ALT: 14 U/L (ref 14–54)
AST: 21 U/L (ref 15–41)
Albumin: 3.5 g/dL (ref 3.5–5.0)
Alkaline Phosphatase: 64 U/L (ref 38–126)
BILIRUBIN TOTAL: 0.5 mg/dL (ref 0.3–1.2)
Total Protein: 6.3 g/dL — ABNORMAL LOW (ref 6.5–8.1)

## 2017-11-29 MED ORDER — CEPHALEXIN 500 MG PO CAPS: 500.0000 mg | ORAL_CAPSULE | Freq: Four times a day (QID) | ORAL | 0 refills | Status: DC

## 2017-11-29 NOTE — ED Notes (Signed)
[facility] called for transport.  

## 2017-11-29 NOTE — ED Provider Notes (Signed)
[facility] EMERGENCY DEPARTMENT Provider Note   CSN: [id number] Arrival date & time: 11/29/17  1949     History   Chief Complaint Chief Complaint  Patient presents with  . Loss of Consciousness    HPI Sheila Flynn is a 72 y.o. female.  Patient had syncopal episode today while she was eating.  Patient supposedly got dizzy and weak.   The history is provided by the patient.  Loss of Consciousness   This is a new problem. The current episode started less than 1 hour ago. The problem occurs rarely. The problem has been resolved. She lost consciousness for a period of less than one minute. The problem is associated with normal activity. Associated symptoms include dizziness. Pertinent negatives include abdominal pain, back pain, chest pain, congestion, headaches, seizures and visual change.    Past Medical History:  Diagnosis Date  . Acute respiratory failure ([place]) 12/21/10  . Angina at rest [facility])   . Aspiration pneumonia ([place])   . CAD (coronary artery disease)    Dr. Matthew Saras  . Chronic constipation   . Chronic diastolic CHF (congestive heart failure) ([place])   . CKD (chronic kidney disease)   . COPD (chronic obstructive pulmonary disease) ([place])   . Delirium, induced by drug ([place])    polypharmacy  . Dementia   . 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 Fall    history of falls  . GERD (gastroesophageal reflux disease)   . Hyperlipidemia LDL goal < 100   . Major depressive disorder   . 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 Migraine    "monthly" (11/06/2014)  . Myocardial infarction ([place])    "I've had about 3" (11/06/2014)  . On home oxygen therapy    "prn at the nursing home" (11/06/2014)  . Pneumonia 12/21/10  . Pulmonary hypertension ([place])   . Rectal cancer ([place])    s/p colostomy  . Sacral  fracture, closed ([place])    s/p kyphoplasty  . Stroke [facility]) 2011   postop  . Stroke [facility]) ?2014   "left side sometimes weaker since" (11/06/2014)  . Thyroid goiter   . Vascular dementia with depressed mood     Patient Active Problem List   Diagnosis Date Noted  . Seasonal allergic rhinitis 08/25/2016  . Hypertensive heart disease with congestive heart failure ([place]) 05/11/2016  . Status post colostomy ([place]) 01/03/2016  . Dyslipidemia associated with type 2 diabetes mellitus ([place]) 11/04/2015  . GERD (gastroesophageal reflux disease) 03/31/2014  . Depression due to dementia 02/17/2014  . Chronic diastolic CHF (congestive heart failure) ([place])   . CAD (coronary artery disease)   . Type 2 diabetes mellitus with neurological complications ([place])   . COPD (chronic obstructive pulmonary disease) ([place])   . Vascular dementia with depressed mood   . 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 Thyroid goiter   . Chronic constipation   . ADENOCARCINOMA, RECTUM 08/09/2009  . Cerebral artery occlusion with cerebral infarction ([place]) 09/07/2007    Past Surgical History:  Procedure Laterality Date  . ABDOMINAL HYSTERECTOMY    . APPENDECTOMY    . COLON SURGERY    . COLOSTOMY     rectal ca  . KYPHOPLASTY     sacrum  . right cataract  11-19-2015  . TONSILLECTOMY    . TUBAL LIGATION  OB History    No data available       Home Medications    Prior to Admission medications   Medication Sig Start Date End Date Taking? Authorizing Provider  acetaminophen (TYLENOL) 325 MG tablet Take 650 mg by mouth every 6 (six) hours as needed for mild pain.   Yes [provider]  amLODipine (NORVASC) 10 MG tablet Take 10 mg by mouth daily.   Yes [provider]  Artificial Tear Solution (TEARS PURE) 0.1-0.3 % SOLN Apply 1 drop to eye every 8 (eight) hours as needed.   Yes [provider]  aspirin (ASPIRIN EC) 81 MG EC tablet Take 81 mg by mouth daily. Swallow  whole.   Yes [provider]  atorvastatin (LIPITOR) 80 MG tablet Take 1 tablet (80 mg total) by mouth daily. 08/26/15  Yes Gerlene Fee, NP  calcitRIOL (ROCALTROL) 0.25 MCG capsule Take 0.25 mcg by mouth daily.   Yes [provider]  carvedilol (COREG) 6.25 MG tablet Take 6.25 mg by mouth 2 (two) times daily with a meal.   Yes [provider]  cetirizine (ZYRTEC) 10 MG tablet Take 10 mg by mouth as needed for allergies.    Yes [provider]  Cholecalciferol 50000 units TABS Take 50,000 Units by mouth. Take one tablet by mouth one time a day  Starting on the 14 th and ending on the 14 th every month.   Yes [provider]  docusate sodium (COLACE) 100 MG capsule Take 100 mg by mouth at bedtime.    Yes [provider]  GLUCAGON EMERGENCY 1 MG injection Inject 1 mg into the skin as needed. 11/19/17  Yes [provider]  hydrALAZINE (APRESOLINE) 25 MG tablet Take 25 mg by mouth 2 (two) times daily.    Yes [provider]  isosorbide mononitrate (ISMO,MONOKET) 20 MG tablet Take 60 mg by mouth daily.    Yes [provider]  losartan (COZAAR) 50 MG tablet Take 50 mg by mouth 2 (two) times daily.   Yes [provider]  metFORMIN (GLUCOPHAGE) 500 MG tablet Take 500 mg by mouth daily. 10/09/17  Yes [provider]  mirtazapine (REMERON) 7.5 MG tablet Take 7.5 mg by mouth at bedtime. 10/09/17  Yes [provider]  nitroGLYCERIN (NITROSTAT) 0.4 MG SL tablet Place 0.4 mg under the tongue every 5 (five) minutes as needed for chest pain.   Yes [provider]  omeprazole (PRILOSEC) 20 MG capsule Take 20 mg by mouth daily.    Yes [provider]  PARoxetine (PAXIL) 20 MG tablet Take 40 mg by mouth.    Yes [provider]  potassium chloride SA (K-DUR,KLOR-CON) 20 MEQ tablet Take 20 mEq by mouth daily.   Yes [provider]  sennosides-docusate sodium (SENOKOT-S) 8.6-50  MG tablet Take 2 tablets by mouth every evening.    Yes [provider]  traZODone (DESYREL) 50 MG tablet Take 50 mg by mouth at bedtime.   Yes [provider]  cephALEXin (KEFLEX) 500 MG capsule Take 1 capsule (500 mg total) by mouth 4 (four) times daily. 11/29/17   Milton Ferguson, MD    Family History History reviewed. No pertinent family history.  Social History Social History   Tobacco Use  . Smoking status: Former Smoker    Packs/day: 0.50    Years: 2.00    Pack years: 1.00    Types: Cigarettes  . Smokeless tobacco: Never Used  . Tobacco comment: "  quit smoking cigarettes in 1969"  Substance Use Topics  . Alcohol use: No  . Drug use: Yes    Comment: previously smoked crack, has had positive UDS for amphetamines and narcotics     Allergies   Ace inhibitors   Review of Systems Review of Systems  Constitutional: Negative for appetite change and fatigue.  HENT: Negative for congestion, ear discharge and sinus pressure.   Eyes: Negative for discharge.  Respiratory: Negative for cough.   Cardiovascular: Positive for syncope. Negative for chest pain.  Gastrointestinal: Negative for abdominal pain and diarrhea.  Genitourinary: Negative for frequency and hematuria.  Musculoskeletal: Negative for back pain.  Skin: Negative for rash.  Neurological: Positive for dizziness. Negative for seizures and headaches.  Psychiatric/Behavioral: Negative for hallucinations.     Physical Exam Updated Vital Signs BP (!) 121/50   Pulse 69   Temp 98.7 F (37.1 C) (Oral)   Resp 16   Ht 5\' 6"  (1.676 m)   Wt 75.8 kg (167 lb)   SpO2 95%   BMI 26.95 kg/m   Physical Exam  Constitutional: She appears well-developed.  HENT:  Head: Normocephalic.  Eyes: Conjunctivae and EOM are normal. No scleral icterus.  Neck: Neck supple. No thyromegaly present.  Cardiovascular: Normal rate and regular rhythm. Exam reveals no gallop and no friction rub.  No murmur  heard. Pulmonary/Chest: No stridor. She has no wheezes. She has no rales. She exhibits no tenderness.  Abdominal: She exhibits no distension. There is no tenderness. There is no rebound.  Musculoskeletal: Normal range of motion. She exhibits no edema.  Lymphadenopathy:    She has no cervical adenopathy.  Neurological: She exhibits normal muscle tone. Coordination normal.  Difficulty speaking secondary to old stroke  Skin: No rash noted. No erythema.  Psychiatric: She has a normal mood and affect. Her behavior is normal.     ED Treatments / Results  Labs (all labs ordered are listed, but only abnormal results are displayed) Labs Reviewed  BASIC METABOLIC PANEL - Abnormal; Notable for the following components:      Result Value   Glucose, Bld 120 (*)    Creatinine, Ser 1.48 (*)    GFR calc non Af Amer 35 (*)    GFR calc Af Amer 40 (*)    All other components within normal limits  CBC - Abnormal; Notable for the following components:   RBC 3.75 (*)    Hemoglobin 10.0 (*)    HCT 31.8 (*)    RDW 16.1 (*)    All other components within normal limits  URINALYSIS, ROUTINE W REFLEX MICROSCOPIC - Abnormal; Notable for the following components:   APPearance HAZY (*)    Protein, ur 30 (*)    Nitrite POSITIVE (*)    Leukocytes, UA SMALL (*)    Bacteria, UA MANY (*)    All other components within normal limits  HEPATIC FUNCTION PANEL - Abnormal; Notable for the following components:   Total Protein 6.3 (*)    Bilirubin, Direct <0.1 (*)    All other components within normal limits  URINE CULTURE  DIFFERENTIAL  I-STAT TROPONIN, ED    EKG  EKG Interpretation  Date/Time:  Monday November 29 2017 19:55:10 EST Ventricular Rate:  73 PR Interval:    QRS Duration: 157 QT Interval:  455 QTC Calculation: 502 R Axis:   92 Text Interpretation:  Sinus or ectopic atrial rhythm Prolonged QT interval Confirmed by Milton Ferguson [phone]) on 11/29/2017 8:03:17 PM  Radiology Dg Chest 2  View  Result Date: 11/29/2017 CLINICAL DATA:  Syncope today. EXAM: CHEST  2 VIEW COMPARISON:  CT chest 08/31/2017. Single-view of the chest 01/07/2015. FINDINGS: There is cardiomegaly without edema. Lungs are clear. No pneumothorax or pleural effusion. Atherosclerosis is noted. Soft tissue density in the medial right upper lobe is consistent with the patient's enlarged right lobe of the thyroid. IMPRESSION: No acute disease. Cardiomegaly. Atherosclerosis. Enlarged right lobe of the thyroid, chronic. Electronically Signed   By: Inge Rise M.D.   On: 11/29/2017 20:52   Ct Head Wo Contrast  Result Date: 11/29/2017 CLINICAL DATA:  Altered level of consciousness, syncopal episode EXAM: CT HEAD WITHOUT CONTRAST TECHNIQUE: Contiguous axial images were obtained from the base of the skull through the vertex without intravenous contrast. COMPARISON:  CT brain 12/09/2010 FINDINGS: Brain: No acute territorial infarction, hemorrhage, or intracranial mass is visualized. Old small left frontal lobe infarct. Old left cerebellar infarct. Multiple old appearing lacunar infarcts within the bilateral basal ganglia and bilateral periventricular white matter. Moderate small vessel ischemic changes. Mild atrophy. Stable ventricle size. Old lacunar infarct in the left pons. Vascular: No hyperdense vessels.  Carotid artery calcification Skull: Normal. Negative for fracture or focal lesion. Sinuses/Orbits: Mucosal thickening in the ethmoid sinuses. No acute orbital abnormality. Other: None IMPRESSION: 1. No definite CT evidence for acute intracranial abnormality 2. Moderate small vessel ischemic changes of the white matter. Multifocal old infarcts involving the left frontal lobe, left cerebellum and bilateral basal ganglia and white matter Electronically Signed   By: Donavan Foil M.D.   On: 11/29/2017 20:40    Procedures Procedures (including critical care time)  Medications Ordered in ED Medications - No data to  display   Initial Impression / Assessment and Plan / ED Course  I have reviewed the triage vital signs and the nursing notes.  Pertinent labs & imaging results that were available during my care of the patient were reviewed by me and considered in my medical decision making (see chart for details).     Labs unremarkable except for urinary tract infection.  Patient will be discharged home to follow-up with her PCP and put on Keflex  Final Clinical Impressions(s) / ED Diagnoses   Final diagnoses:  Near syncope  Acute cystitis without hematuria    ED Discharge Orders        Ordered    cephALEXin (KEFLEX) 500 MG capsule  4 times daily     11/29/17 2303       Milton Ferguson, MD 11/29/17 2305

## 2017-11-29 NOTE — ED Triage Notes (Signed)
Pt coming from [place] by ems. Pt was eating dinner when pt had a  Witnessed syncopal episode. Care givers are unsure of the duration but state that her hr was in the 40's. When ems arrived pt was awoke. Able to answer question. Pt has hx of stroke with facial droop and speech impairment. Pt is axox4 at this time.

## 2017-11-29 NOTE — Discharge Instructions (Signed)
Follow-up with your family doctor next week for recheck of urine and symptoms

## 2017-11-29 NOTE — ED Notes (Signed)
Patient unable to [employer]

## 2017-12-02 LAB — URINE CULTURE

## 2017-12-03 ENCOUNTER — Telehealth: Payer: Self-pay | Admitting: Emergency Medicine

## 2017-12-03 NOTE — Telephone Encounter (Signed)
Post ED Visit - Positive Culture Follow-up: Successful Patient Follow-Up  Culture assessed and recommendations reviewed by: [x]  Elenor Quinones, Pharm.D. []  Heide Guile, Pharm.D., BCPS AQ-ID []  Parks Neptune, Pharm.D., BCPS []  Alycia Rossetti, Pharm.D., BCPS []  [place], Pharm.D., BCPS, AAHIVP []  Legrand Como, Pharm.D., BCPS, AAHIVP []  Salome Arnt, PharmD, BCPS []  Dimitri Ped, PharmD, BCPS []  Vincenza Hews, PharmD, BCPS  Positive urine culture  []  Patient discharged without antimicrobial prescription and treatment is now indicated [x]  Organism is resistant to prescribed ED discharge antimicrobial []  Patient with positive blood cultures  Changes discussed with ED provider: Benedetto Goad PA New antibiotic prescription stop Keflex, start Cefdopoxime 100mg  po bid x 5 days  Faxed to [facility] [place], [place] 12/03/2017, 12:28 PM

## 2017-12-03 NOTE — Progress Notes (Signed)
ED Antimicrobial Stewardship Positive Culture Follow Up   Sheila Flynn is an 72 y.o. female who presented to [facility] on 11/29/2017 with a chief complaint of  Chief Complaint  Patient presents with  . Loss of Consciousness    Recent Results (from the past 720 hour(s))  Urine Culture     Status: Abnormal   Collection Time: 11/29/17 10:52 PM  Result Value Ref Range Status   Specimen Description URINE, RANDOM  Final   Special Requests NONE  Final   Culture >=100,000 COLONIES/mL PROVIDENCIA STUARTII (A)  Final   Report Status 12/02/2017 FINAL  Final   Organism ID, Bacteria PROVIDENCIA STUARTII (A)  Final      Susceptibility   Providencia stuartii - MIC*    AMPICILLIN >=32 RESISTANT Resistant     CEFAZOLIN >=64 RESISTANT Resistant     CEFTRIAXONE <=1 SENSITIVE Sensitive     CIPROFLOXACIN >=4 RESISTANT Resistant     GENTAMICIN RESISTANT Resistant     IMIPENEM 4 SENSITIVE Sensitive     NITROFURANTOIN 128 RESISTANT Resistant     TRIMETH/SULFA >=320 RESISTANT Resistant     AMPICILLIN/SULBACTAM >=32 RESISTANT Resistant     PIP/TAZO <=4 SENSITIVE Sensitive     * >=100,000 COLONIES/mL PROVIDENCIA STUARTII    [x]  Treated with Keflex, organism resistant to prescribed antimicrobial  New antibiotic prescription: Stop Keflex. Start cefpodoxime 100mg  PO BID x 5 days  ED Provider: Benedetto Goad [facility] 12/03/2017, 9:31 AM Infectious Diseases Pharmacist Phone# [phone]

## 2018-03-03 ENCOUNTER — Non-Acute Institutional Stay: Payer: Self-pay | Admitting: Hospice and Palliative Medicine

## 2018-03-03 DIAGNOSIS — Z515 Encounter for palliative care: Secondary | ICD-10-CM

## 2018-03-03 NOTE — Progress Notes (Signed)
PALLIATIVE CARE CONSULT VISIT   PATIENT NAME: Sheila Flynn DOB: 12-19-1945 MRN: [id number]  PRIMARY CARE PROVIDER: Dr. Rene Kocher PROVIDER: Dallie Piles, NP  RESPONSIBLE PARTY:   Daughter - Sheila Flynn - [phone]   RECOMMENDATIONS and PLAN:  1. DNR/DNI/DNH noted.  2. Would recommend hospice referral 3. Patient has ordered comfort meds including morphine, lorazepam, and atropine  I spent 60 minutes providing this consultation,  from 1300 to 1400. More than 50% of the time in this consultation was spent coordinating communication.   HISTORY OF PRESENT ILLNESS:  Sheila Flynn is a 72 y.o. year old female with multiple medical problems including CAD, h/o CVA, vascular dementia, COPD, DM, dCHF, and h/o colon cancer s/p colostomy. Apparently, patient was incidentally found to have a L. Renal mass, presumed to be RCC. She was seen by urology in 10/2017, who felt that patient was too high risk for treatment. Patient was noted recently to have had weight loss (25lbs in past six months down to current weight of 138lbs) and A/CKD with SeCr 4.81 and serum calcium of 14.7, presumably in setting of advanced cancer. Another palliative care group was consulted to help address goals and family has opted to pursue hospice. I spoke at length with the facility SW, nurse, ADON, and NP.   CODE STATUS: DNR  PPS: 10% HOSPICE ELIGIBILITY/DIAGNOSIS: YES  PAST MEDICAL HISTORY:  Past Medical History:  Diagnosis Date  . Acute respiratory failure ([place]) 12/21/10  . Angina at rest [facility])   . Aspiration pneumonia ([place])   . CAD (coronary artery disease)    Dr. Matthew Saras  . Chronic constipation   . Chronic diastolic CHF (congestive heart failure) ([place])   . CKD (chronic kidney disease)   . COPD (chronic obstructive pulmonary disease) ([place])   . Delirium, induced by drug ([place])    polypharmacy  . Dementia   . 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 Fall    history of falls  . GERD (gastroesophageal reflux disease)   . Hyperlipidemia LDL goal < 100   . Major depressive disorder   . 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 Migraine    "monthly" (11/06/2014)  . Myocardial infarction ([place])    "I've had about 3" (11/06/2014)  . On home oxygen therapy    "prn at the nursing home" (11/06/2014)  . Pneumonia 12/21/10  . Pulmonary hypertension ([place])   . Rectal cancer ([place])    s/p colostomy  . Sacral fracture, closed ([place])    s/p kyphoplasty  . Stroke [facility]) 2011   postop  . Stroke [facility]) ?2014   "left side sometimes weaker since" (11/06/2014)  . Thyroid goiter   . Vascular dementia with depressed mood     SOCIAL HX:  Social History   Tobacco Use  . Smoking status: Former Smoker    Packs/day: 0.50    Years: 2.00    Pack years: 1.00    Types: Cigarettes  . Smokeless tobacco: Never Used  . Tobacco comment: "quit smoking cigarettes in 1969"  Substance Use Topics  . Alcohol use: No    ALLERGIES:  Allergies  Allergen Reactions  . Ace Inhibitors Swelling     PERTINENT MEDICATIONS:  Outpatient Encounter Medications as of 03/03/2018  Medication Sig  . acetaminophen (TYLENOL) 325 MG tablet Take 650 mg by mouth every 6 (six)  hours as needed for mild pain.  Marland Kitchen amLODipine (NORVASC) 10 MG tablet Take 10 mg by mouth daily.  . Artificial Tear Solution (TEARS PURE) 0.1-0.3 % SOLN Apply 1 drop to eye every 8 (eight) hours as needed.  Marland Kitchen aspirin (ASPIRIN EC) 81 MG EC tablet Take 81 mg by mouth daily. Swallow whole.  Marland Kitchen atorvastatin (LIPITOR) 80 MG tablet Take 1 tablet (80 mg total) by mouth daily.  . calcitRIOL (ROCALTROL) 0.25 MCG capsule Take 0.25 mcg by mouth daily.  . carvedilol (COREG) 6.25 MG tablet Take 6.25 mg by mouth 2 (two) times daily with a meal.  . cephALEXin (KEFLEX) 500 MG capsule Take 1 capsule (500 mg total)  by mouth 4 (four) times daily.  . cetirizine (ZYRTEC) 10 MG tablet Take 10 mg by mouth as needed for allergies.   . Cholecalciferol 50000 units TABS Take 50,000 Units by mouth. Take one tablet by mouth one time a day  Starting on the 14 th and ending on the 14 th every month.  . docusate sodium (COLACE) 100 MG capsule Take 100 mg by mouth at bedtime.   Marland Kitchen GLUCAGON EMERGENCY 1 MG injection Inject 1 mg into the skin as needed.  . hydrALAZINE (APRESOLINE) 25 MG tablet Take 25 mg by mouth 2 (two) times daily.   . isosorbide mononitrate (ISMO,MONOKET) 20 MG tablet Take 60 mg by mouth daily.   Marland Kitchen losartan (COZAAR) 50 MG tablet Take 50 mg by mouth 2 (two) times daily.  . metFORMIN (GLUCOPHAGE) 500 MG tablet Take 500 mg by mouth daily.  . mirtazapine (REMERON) 7.5 MG tablet Take 7.5 mg by mouth at bedtime.  . nitroGLYCERIN (NITROSTAT) 0.4 MG SL tablet Place 0.4 mg under the tongue every 5 (five) minutes as needed for chest pain.  Marland Kitchen omeprazole (PRILOSEC) 20 MG capsule Take 20 mg by mouth daily.   Marland Kitchen PARoxetine (PAXIL) 20 MG tablet Take 40 mg by mouth.   . potassium chloride SA (K-DUR,KLOR-CON) 20 MEQ tablet Take 20 mEq by mouth daily.  . sennosides-docusate sodium (SENOKOT-S) 8.6-50 MG tablet Take 2 tablets by mouth every evening.   . traZODone (DESYREL) 50 MG tablet Take 50 mg by mouth at bedtime.   No facility-administered encounter medications on file as of 03/03/2018.     PHYSICAL EXAM:   General: frail appearing, lying in bed Cardiovascular: regular rate and rhythm Pulmonary: clear ant fields Abdomen: soft, nontender, + bowel sounds GU: no suprapubic tenderness Extremities: no edema, no joint deformities Skin: no rashes Neurological: Weakness, nonverbal, follows simple commands  Irean Hong, NP

## 2018-03-10 ENCOUNTER — Non-Acute Institutional Stay: Payer: Self-pay | Admitting: Hospice and Palliative Medicine

## 2018-03-10 DIAGNOSIS — Z515 Encounter for palliative care: Secondary | ICD-10-CM

## 2018-03-10 NOTE — Progress Notes (Signed)
PALLIATIVE CARE CONSULT VISIT   PATIENT NAME: Sheila Flynn DOB: 10/23/1946 MRN: [id number]  PRIMARY CARE PROVIDER: Dr. Rene Kocher PROVIDER: Dallie Piles, NP  RESPONSIBLE PARTY:   Daughter - Jeral Pinch - [phone]   RECOMMENDATIONS and PLAN:  1. Patient is pending admission to hospice. She seems clinically unchanged. Staff report that po fluid intake is slightly improved but intake of food remains poor. No distressing symptoms noted today.   I spent 10 minutes providing this consultation,  from 0850 to 0900. More than 50% of the time in this consultation was spent coordinating communication.   HISTORY OF PRESENT ILLNESS:  Sheila Flynn is a 72 y.o. year old female with multiple medical problems including CAD, h/o CVA, vascular dementia, COPD, DM, dCHF, and h/o colon cancer s/p colostomy. Patient was found to have probable RCC and has had recent weight loss and decline.  She is pending hospice admission  CODE STATUS: DNR  PPS: 10% HOSPICE ELIGIBILITY/DIAGNOSIS: YES  PAST MEDICAL HISTORY:  Past Medical History:  Diagnosis Date  . Acute respiratory failure ([place]) 12/21/10  . Angina at rest [facility])   . Aspiration pneumonia ([place])   . CAD (coronary artery disease)    Dr. Matthew Saras  . Chronic constipation   . Chronic diastolic CHF (congestive heart failure) ([place])   . CKD (chronic kidney disease)   . COPD (chronic obstructive pulmonary disease) ([place])   . Delirium, induced by drug ([place])    polypharmacy  . Dementia   . 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 Fall    history of falls  . GERD (gastroesophageal reflux disease)   . Hyperlipidemia LDL goal < 100   . Major depressive disorder   . Malignant essential hypertension with congestive heart failure with chronic kidney disease ([place])   . Migraine    "monthly" (11/06/2014)  . Myocardial infarction ([place])    "I've  had about 3" (11/06/2014)  . On home oxygen therapy    "prn at the nursing home" (11/06/2014)  . Pneumonia 12/21/10  . Pulmonary hypertension ([place])   . Rectal cancer ([place])    s/p colostomy  . Sacral fracture, closed ([place])    s/p kyphoplasty  . Stroke [facility]) 2011   postop  . Stroke [facility]) ?2014   "left side sometimes weaker since" (11/06/2014)  . Thyroid goiter   . Vascular dementia with depressed mood     SOCIAL HX:  Social History   Tobacco Use  . Smoking status: Former Smoker    Packs/day: 0.50    Years: 2.00    Pack years: 1.00    Types: Cigarettes  . Smokeless tobacco: Never Used  . Tobacco comment: "quit smoking cigarettes in 1969"  Substance Use Topics  . Alcohol use: No    ALLERGIES:  Allergies  Allergen Reactions  . Ace Inhibitors Swelling     PERTINENT MEDICATIONS:  Outpatient Encounter Medications as of 03/10/2018  Medication Sig  . acetaminophen (TYLENOL) 325 MG tablet Take 650 mg by mouth every 6 (six) hours as needed for mild pain.  Marland Kitchen amLODipine (NORVASC) 10 MG tablet Take 10 mg by mouth daily.  . Artificial Tear Solution (TEARS PURE) 0.1-0.3 % SOLN Apply 1 drop to eye every 8 (eight) hours as needed.  Marland Kitchen aspirin (ASPIRIN EC) 81 MG EC tablet Take 81 mg by mouth daily. Swallow whole.  Marland Kitchen atorvastatin (LIPITOR) 80 MG tablet Take  1 tablet (80 mg total) by mouth daily.  . calcitRIOL (ROCALTROL) 0.25 MCG capsule Take 0.25 mcg by mouth daily.  . carvedilol (COREG) 6.25 MG tablet Take 6.25 mg by mouth 2 (two) times daily with a meal.  . cephALEXin (KEFLEX) 500 MG capsule Take 1 capsule (500 mg total) by mouth 4 (four) times daily.  . cetirizine (ZYRTEC) 10 MG tablet Take 10 mg by mouth as needed for allergies.   . Cholecalciferol 50000 units TABS Take 50,000 Units by mouth. Take one tablet by mouth one time a day  Starting on the 14 th and ending on the 14 th every month.  . docusate sodium (COLACE) 100 MG capsule Take 100 mg by mouth at bedtime.   Marland Kitchen GLUCAGON  EMERGENCY 1 MG injection Inject 1 mg into the skin as needed.  . hydrALAZINE (APRESOLINE) 25 MG tablet Take 25 mg by mouth 2 (two) times daily.   . isosorbide mononitrate (ISMO,MONOKET) 20 MG tablet Take 60 mg by mouth daily.   Marland Kitchen losartan (COZAAR) 50 MG tablet Take 50 mg by mouth 2 (two) times daily.  . metFORMIN (GLUCOPHAGE) 500 MG tablet Take 500 mg by mouth daily.  . mirtazapine (REMERON) 7.5 MG tablet Take 7.5 mg by mouth at bedtime.  . nitroGLYCERIN (NITROSTAT) 0.4 MG SL tablet Place 0.4 mg under the tongue every 5 (five) minutes as needed for chest pain.  Marland Kitchen omeprazole (PRILOSEC) 20 MG capsule Take 20 mg by mouth daily.   Marland Kitchen PARoxetine (PAXIL) 20 MG tablet Take 40 mg by mouth.   . potassium chloride SA (K-DUR,KLOR-CON) 20 MEQ tablet Take 20 mEq by mouth daily.  . sennosides-docusate sodium (SENOKOT-S) 8.6-50 MG tablet Take 2 tablets by mouth every evening.   . traZODone (DESYREL) 50 MG tablet Take 50 mg by mouth at bedtime.   No facility-administered encounter medications on file as of 03/10/2018.     PHYSICAL EXAM:   General: frail appearing, lying in bed Pulmonary: unlabored Extremities: no edema, no joint deformities Skin: no rashes Neurological: Weakness, nonverbal, follows simple commands  Irean Hong, NP

## 2020-11-14 ENCOUNTER — Other Ambulatory Visit: Payer: Self-pay | Admitting: Internal Medicine

## 2020-11-14 DIAGNOSIS — D142 Benign neoplasm of trachea: Secondary | ICD-10-CM

## 2020-12-02 ENCOUNTER — Ambulatory Visit
Admission: RE | Admit: 2020-12-02 | Discharge: 2020-12-02 | Disposition: A | Discharge status: Home / Self Care | Payer: Medicare Other | Source: Ambulatory Visit | Attending: Internal Medicine | Admitting: Internal Medicine

## 2020-12-02 ENCOUNTER — Other Ambulatory Visit: Payer: Self-pay | Admitting: Internal Medicine

## 2020-12-02 DIAGNOSIS — D142 Benign neoplasm of trachea: Secondary | ICD-10-CM

## 2021-02-24 ENCOUNTER — Emergency Department (HOSPITAL_COMMUNITY): Payer: Medicare (Managed Care)

## 2021-02-24 ENCOUNTER — Encounter (HOSPITAL_COMMUNITY): Payer: Self-pay | Admitting: Family Medicine

## 2021-02-24 ENCOUNTER — Inpatient Hospital Stay (HOSPITAL_COMMUNITY)
Admission: EM | Admit: 2021-02-24 | Discharge: 2021-03-01 | DRG: 638 | Disposition: A | Discharge status: Skilled Nursing Facility | Payer: Medicare (Managed Care) | Source: Skilled Nursing Facility | Attending: Family Medicine | Admitting: Family Medicine

## 2021-02-24 ENCOUNTER — Other Ambulatory Visit: Payer: Self-pay

## 2021-02-24 ENCOUNTER — Observation Stay (HOSPITAL_COMMUNITY): Payer: Medicare (Managed Care)

## 2021-02-24 DIAGNOSIS — Z85048 Personal history of other malignant neoplasm of rectum, rectosigmoid junction, and anus: Secondary | ICD-10-CM

## 2021-02-24 DIAGNOSIS — I5032 Chronic diastolic (congestive) heart failure: Secondary | ICD-10-CM | POA: Diagnosis present

## 2021-02-24 DIAGNOSIS — E2740 Unspecified adrenocortical insufficiency: Secondary | ICD-10-CM | POA: Diagnosis present

## 2021-02-24 DIAGNOSIS — K5909 Other constipation: Secondary | ICD-10-CM | POA: Diagnosis present

## 2021-02-24 DIAGNOSIS — E1151 Type 2 diabetes mellitus with diabetic peripheral angiopathy without gangrene: Secondary | ICD-10-CM | POA: Diagnosis present

## 2021-02-24 DIAGNOSIS — I6350 Cerebral infarction due to unspecified occlusion or stenosis of unspecified cerebral artery: Secondary | ICD-10-CM | POA: Diagnosis present

## 2021-02-24 DIAGNOSIS — I130 Hypertensive heart and chronic kidney disease with heart failure and stage 1 through stage 4 chronic kidney disease, or unspecified chronic kidney disease: Secondary | ICD-10-CM | POA: Diagnosis present

## 2021-02-24 DIAGNOSIS — R531 Weakness: Secondary | ICD-10-CM

## 2021-02-24 DIAGNOSIS — E162 Hypoglycemia, unspecified: Secondary | ICD-10-CM

## 2021-02-24 DIAGNOSIS — R4182 Altered mental status, unspecified: Secondary | ICD-10-CM | POA: Diagnosis not present

## 2021-02-24 DIAGNOSIS — F5105 Insomnia due to other mental disorder: Secondary | ICD-10-CM | POA: Diagnosis present

## 2021-02-24 DIAGNOSIS — E440 Moderate protein-calorie malnutrition: Secondary | ICD-10-CM | POA: Insufficient documentation

## 2021-02-24 DIAGNOSIS — I69392 Facial weakness following cerebral infarction: Secondary | ICD-10-CM

## 2021-02-24 DIAGNOSIS — N132 Hydronephrosis with renal and ureteral calculous obstruction: Secondary | ICD-10-CM | POA: Diagnosis present

## 2021-02-24 DIAGNOSIS — E1149 Type 2 diabetes mellitus with other diabetic neurological complication: Secondary | ICD-10-CM | POA: Diagnosis not present

## 2021-02-24 DIAGNOSIS — E11649 Type 2 diabetes mellitus with hypoglycemia without coma: Principal | ICD-10-CM | POA: Diagnosis present

## 2021-02-24 DIAGNOSIS — E1122 Type 2 diabetes mellitus with diabetic chronic kidney disease: Secondary | ICD-10-CM | POA: Diagnosis present

## 2021-02-24 DIAGNOSIS — I2510 Atherosclerotic heart disease of native coronary artery without angina pectoris: Secondary | ICD-10-CM | POA: Diagnosis present

## 2021-02-24 DIAGNOSIS — J449 Chronic obstructive pulmonary disease, unspecified: Secondary | ICD-10-CM | POA: Diagnosis present

## 2021-02-24 DIAGNOSIS — K8689 Other specified diseases of pancreas: Secondary | ICD-10-CM | POA: Diagnosis present

## 2021-02-24 DIAGNOSIS — I2721 Secondary pulmonary arterial hypertension: Secondary | ICD-10-CM | POA: Diagnosis present

## 2021-02-24 DIAGNOSIS — R609 Edema, unspecified: Secondary | ICD-10-CM

## 2021-02-24 DIAGNOSIS — Z888 Allergy status to other drugs, medicaments and biological substances status: Secondary | ICD-10-CM

## 2021-02-24 DIAGNOSIS — C649 Malignant neoplasm of unspecified kidney, except renal pelvis: Secondary | ICD-10-CM

## 2021-02-24 DIAGNOSIS — I69322 Dysarthria following cerebral infarction: Secondary | ICD-10-CM

## 2021-02-24 DIAGNOSIS — K219 Gastro-esophageal reflux disease without esophagitis: Secondary | ICD-10-CM | POA: Diagnosis present

## 2021-02-24 DIAGNOSIS — I252 Old myocardial infarction: Secondary | ICD-10-CM

## 2021-02-24 DIAGNOSIS — N1832 Chronic kidney disease, stage 3b: Secondary | ICD-10-CM | POA: Diagnosis present

## 2021-02-24 DIAGNOSIS — C642 Malignant neoplasm of left kidney, except renal pelvis: Secondary | ICD-10-CM | POA: Diagnosis present

## 2021-02-24 DIAGNOSIS — F32A Depression, unspecified: Secondary | ICD-10-CM | POA: Diagnosis present

## 2021-02-24 DIAGNOSIS — Z20822 Contact with and (suspected) exposure to covid-19: Secondary | ICD-10-CM | POA: Diagnosis present

## 2021-02-24 DIAGNOSIS — D631 Anemia in chronic kidney disease: Secondary | ICD-10-CM | POA: Diagnosis present

## 2021-02-24 DIAGNOSIS — E785 Hyperlipidemia, unspecified: Secondary | ICD-10-CM | POA: Diagnosis present

## 2021-02-24 DIAGNOSIS — Z87891 Personal history of nicotine dependence: Secondary | ICD-10-CM

## 2021-02-24 DIAGNOSIS — Z933 Colostomy status: Secondary | ICD-10-CM

## 2021-02-24 DIAGNOSIS — I69354 Hemiplegia and hemiparesis following cerebral infarction affecting left non-dominant side: Secondary | ICD-10-CM

## 2021-02-24 DIAGNOSIS — F0150 Vascular dementia without behavioral disturbance: Secondary | ICD-10-CM | POA: Diagnosis present

## 2021-02-24 DIAGNOSIS — Z79899 Other long term (current) drug therapy: Secondary | ICD-10-CM

## 2021-02-24 DIAGNOSIS — Z7982 Long term (current) use of aspirin: Secondary | ICD-10-CM

## 2021-02-24 LAB — I-STAT CHEM 8, ED
BUN: 24 mg/dL — ABNORMAL HIGH (ref 8–23)
Calcium, Ion: 1.05 mmol/L — ABNORMAL LOW (ref 1.15–1.40)
Chloride: 105 mmol/L (ref 98–111)
Creatinine, Ser: 1.8 mg/dL — ABNORMAL HIGH (ref 0.44–1.00)
Glucose, Bld: 44 mg/dL — CL (ref 70–99)
HCT: 40 % (ref 36.0–46.0)
Hemoglobin: 13.6 g/dL (ref 12.0–15.0)
Potassium: 3.9 mmol/L (ref 3.5–5.1)
Sodium: 141 mmol/L (ref 135–145)
TCO2: 26 mmol/L (ref 22–32)

## 2021-02-24 LAB — COMPREHENSIVE METABOLIC PANEL
ALT: 19 U/L (ref 0–44)
AST: 24 U/L (ref 15–41)
Albumin: 3.9 g/dL (ref 3.5–5.0)
Alkaline Phosphatase: 61 U/L (ref 38–126)
Anion gap: 11 (ref 5–15)
BUN: 22 mg/dL (ref 8–23)
CO2: 27 mmol/L (ref 22–32)
Calcium: 9.5 mg/dL (ref 8.9–10.3)
Chloride: 103 mmol/L (ref 98–111)
Creatinine, Ser: 1.76 mg/dL — ABNORMAL HIGH (ref 0.44–1.00)
GFR, Estimated: 30 mL/min — ABNORMAL LOW (ref >60)
Glucose, Bld: 43 mg/dL — CL (ref 70–99)
Potassium: 4 mmol/L (ref 3.5–5.1)
Sodium: 141 mmol/L (ref 135–145)
Total Bilirubin: 0.4 mg/dL (ref 0.3–1.2)
Total Protein: 7.9 g/dL (ref 6.5–8.1)

## 2021-02-24 LAB — URINALYSIS, ROUTINE W REFLEX MICROSCOPIC
Bilirubin Urine: NEGATIVE
Glucose, UA: 50 mg/dL — AB
Hgb urine dipstick: NEGATIVE
Ketones, ur: NEGATIVE mg/dL
Leukocytes,Ua: NEGATIVE
Nitrite: NEGATIVE
Protein, ur: 100 mg/dL — AB
Specific Gravity, Urine: 1.014 (ref 1.005–1.030)
pH: 5 (ref 5.0–8.0)

## 2021-02-24 LAB — DIFFERENTIAL
Abs Immature Granulocytes: 0.02 10*3/uL (ref 0.00–0.07)
Basophils Absolute: 0 10*3/uL (ref 0.0–0.1)
Basophils Relative: 0 %
Eosinophils Absolute: 0 10*3/uL (ref 0.0–0.5)
Eosinophils Relative: 0 %
Immature Granulocytes: 0 %
Lymphocytes Relative: 24 %
Lymphs Abs: 2.1 10*3/uL (ref 0.7–4.0)
Monocytes Absolute: 0.6 10*3/uL (ref 0.1–1.0)
Monocytes Relative: 7 %
Neutro Abs: 6 10*3/uL (ref 1.7–7.7)
Neutrophils Relative %: 69 %

## 2021-02-24 LAB — CBG MONITORING, ED
Glucose-Capillary: 51 mg/dL — ABNORMAL LOW (ref 70–99)
Glucose-Capillary: 120 mg/dL — ABNORMAL HIGH (ref 70–99)
Glucose-Capillary: 173 mg/dL — ABNORMAL HIGH (ref 70–99)
Glucose-Capillary: 77 mg/dL (ref 70–99)
Glucose-Capillary: 58 mg/dL — ABNORMAL LOW (ref 70–99)
Glucose-Capillary: 135 mg/dL — ABNORMAL HIGH (ref 70–99)
Glucose-Capillary: 52 mg/dL — ABNORMAL LOW (ref 70–99)
Glucose-Capillary: 138 mg/dL — ABNORMAL HIGH (ref 70–99)
Glucose-Capillary: 105 mg/dL — ABNORMAL HIGH (ref 70–99)

## 2021-02-24 LAB — HEMOGLOBIN A1C
Hgb A1c MFr Bld: 6 % — ABNORMAL HIGH (ref 4.8–5.6)
Mean Plasma Glucose: 125.5 mg/dL

## 2021-02-24 LAB — CBC
HCT: 41.6 % (ref 36.0–46.0)
Hemoglobin: 12.5 g/dL (ref 12.0–15.0)
MCH: 25.5 pg — ABNORMAL LOW (ref 26.0–34.0)
MCHC: 30 g/dL (ref 30.0–36.0)
MCV: 84.9 fL (ref 80.0–100.0)
Platelets: 273 10*3/uL (ref 150–400)
RBC: 4.9 MIL/uL (ref 3.87–5.11)
RDW: 15.5 % (ref 11.5–15.5)
WBC: 8.6 10*3/uL (ref 4.0–10.5)
nRBC: 0 % (ref 0.0–0.2)

## 2021-02-24 LAB — RAPID URINE DRUG SCREEN, HOSP PERFORMED
Amphetamines: NOT DETECTED
Barbiturates: NOT DETECTED
Benzodiazepines: NOT DETECTED
Cocaine: NOT DETECTED
Opiates: NOT DETECTED
Tetrahydrocannabinol: NOT DETECTED

## 2021-02-24 LAB — APTT: aPTT: 30 seconds (ref 24–36)

## 2021-02-24 LAB — TSH: TSH: 0.438 u[IU]/mL (ref 0.350–4.500)

## 2021-02-24 LAB — BETA-HYDROXYBUTYRIC ACID: Beta-Hydroxybutyric Acid: 0.12 mmol/L (ref 0.05–0.27)

## 2021-02-24 LAB — MAGNESIUM: Magnesium: 1.9 mg/dL (ref 1.7–2.4)

## 2021-02-24 LAB — PROTIME-INR
INR: 1.1 (ref 0.8–1.2)
Prothrombin Time: 13.7 seconds (ref 11.4–15.2)

## 2021-02-24 LAB — PHOSPHORUS: Phosphorus: 2.5 mg/dL (ref 2.5–4.6)

## 2021-02-24 LAB — ETHANOL: Alcohol, Ethyl (B): <10 mg/dL (ref <10)

## 2021-02-24 LAB — SARS CORONAVIRUS 2 (TAT 6-24 HRS): SARS Coronavirus 2: NEGATIVE

## 2021-02-24 MED ORDER — ATORVASTATIN CALCIUM 40 MG PO TABS
40.0000 mg | ORAL_TABLET | Freq: Every day | ORAL | Status: DC
  Administered 2021-02-24 – 2021-02-28 (×5): 40 mg via ORAL
  Filled 2021-02-24 (×5): qty 1

## 2021-02-24 MED ORDER — VITAMIN D 25 MCG (1000 UNIT) PO TABS
5000.0000 [IU] | ORAL_TABLET | Freq: Every morning | ORAL | Status: DC
  Administered 2021-02-25 – 2021-03-01 (×5): 5000 [IU] via ORAL
  Filled 2021-02-24 (×5): qty 5

## 2021-02-24 MED ORDER — AMLODIPINE BESYLATE 10 MG PO TABS
10.0000 mg | ORAL_TABLET | Freq: Every morning | ORAL | Status: DC
  Administered 2021-02-25 – 2021-03-01 (×5): 10 mg via ORAL
  Filled 2021-02-24 (×5): qty 1

## 2021-02-24 MED ORDER — HYDRALAZINE HCL 20 MG/ML IJ SOLN: 10.0000 mg | Freq: Three times a day (TID) | INTRAMUSCULAR | Status: DC

## 2021-02-24 MED ORDER — SODIUM CHLORIDE 0.9 % IV SOLN: 250.0000 mL | INTRAVENOUS | Status: DC | PRN

## 2021-02-24 MED ORDER — SODIUM CHLORIDE 0.9% FLUSH
3.0000 mL | Freq: Once | INTRAVENOUS | Status: AC
  Administered 2021-02-24: 3 mL via INTRAVENOUS

## 2021-02-24 MED ORDER — TRAZODONE HCL 100 MG PO TABS
100.0000 mg | ORAL_TABLET | Freq: Every day | ORAL | Status: DC
  Administered 2021-02-24 – 2021-02-28 (×5): 100 mg via ORAL
  Filled 2021-02-24 (×5): qty 1

## 2021-02-24 MED ORDER — DEXTROSE 50 % IV SOLN
1.0000 | Freq: Once | INTRAVENOUS | Status: AC
  Administered 2021-02-24: 50 mL via INTRAVENOUS
  Filled 2021-02-24: qty 50

## 2021-02-24 MED ORDER — POLYETHYLENE GLYCOL 3350 17 G PO PACK
17.0000 g | PACK | Freq: Every day | ORAL | Status: DC
  Administered 2021-02-25 – 2021-03-01 (×5): 17 g via ORAL
  Filled 2021-02-24 (×6): qty 1

## 2021-02-24 MED ORDER — DEXTROSE-NACL 5-0.9 % IV SOLN: INTRAVENOUS | Status: DC

## 2021-02-24 MED ORDER — CARVEDILOL 6.25 MG PO TABS
6.2500 mg | ORAL_TABLET | Freq: Two times a day (BID) | ORAL | Status: DC
  Administered 2021-02-24 – 2021-03-01 (×10): 6.25 mg via ORAL
  Filled 2021-02-24 (×12): qty 1

## 2021-02-24 MED ORDER — POLYVINYL ALCOHOL 1.4 % OP SOLN
1.0000 [drp] | Freq: Three times a day (TID) | OPHTHALMIC | Status: DC | PRN
  Filled 2021-02-24: qty 15

## 2021-02-24 MED ORDER — DEXTROSE 50 % IV SOLN: 1.0000 | Freq: Once | INTRAVENOUS | Status: AC

## 2021-02-24 MED ORDER — PANTOPRAZOLE SODIUM 40 MG PO TBEC
40.0000 mg | DELAYED_RELEASE_TABLET | Freq: Every day | ORAL | Status: DC
  Administered 2021-02-25 – 2021-03-01 (×5): 40 mg via ORAL
  Filled 2021-02-24 (×5): qty 1

## 2021-02-24 MED ORDER — ASPIRIN 81 MG PO CHEW
81.0000 mg | CHEWABLE_TABLET | Freq: Every morning | ORAL | Status: DC
  Administered 2021-02-25 – 2021-03-01 (×5): 81 mg via ORAL
  Filled 2021-02-24 (×5): qty 1

## 2021-02-24 MED ORDER — HYDRALAZINE HCL 20 MG/ML IJ SOLN
5.0000 mg | Freq: Three times a day (TID) | INTRAMUSCULAR | Status: DC
  Administered 2021-02-24: 5 mg via INTRAVENOUS
  Filled 2021-02-24: qty 1

## 2021-02-24 MED ORDER — SODIUM CHLORIDE 0.9% FLUSH
3.0000 mL | Freq: Two times a day (BID) | INTRAVENOUS | Status: DC
  Administered 2021-02-24 – 2021-03-01 (×10): 3 mL via INTRAVENOUS

## 2021-02-24 MED ORDER — SODIUM CHLORIDE 0.9% FLUSH: 3.0000 mL | INTRAVENOUS | Status: DC | PRN

## 2021-02-24 MED ORDER — MELATONIN 5 MG PO TABS
10.0000 mg | ORAL_TABLET | Freq: Every day | ORAL | Status: DC
  Administered 2021-02-24 – 2021-02-28 (×5): 10 mg via ORAL
  Filled 2021-02-24 (×5): qty 2

## 2021-02-24 MED ORDER — DEXTROSE 50 % IV SOLN
25.0000 g | INTRAVENOUS | Status: AC
  Administered 2021-02-24: 25 g via INTRAVENOUS
  Filled 2021-02-24: qty 50

## 2021-02-24 MED ORDER — ENOXAPARIN SODIUM 30 MG/0.3ML ~~LOC~~ SOLN
30.0000 mg | SUBCUTANEOUS | Status: DC
  Administered 2021-02-26: 30 mg via SUBCUTANEOUS
  Filled 2021-02-24 (×3): qty 0.3

## 2021-02-24 MED ORDER — HYDRALAZINE HCL 25 MG PO TABS
25.0000 mg | ORAL_TABLET | Freq: Two times a day (BID) | ORAL | Status: DC
  Administered 2021-02-25 – 2021-03-01 (×9): 25 mg via ORAL
  Filled 2021-02-24 (×9): qty 1

## 2021-02-24 MED ORDER — LOSARTAN POTASSIUM 50 MG PO TABS
50.0000 mg | ORAL_TABLET | Freq: Two times a day (BID) | ORAL | Status: DC
  Administered 2021-02-25 – 2021-02-26 (×3): 50 mg via ORAL
  Filled 2021-02-24 (×3): qty 1

## 2021-02-24 MED ORDER — ISOSORBIDE MONONITRATE 20 MG PO TABS
60.0000 mg | ORAL_TABLET | Freq: Every morning | ORAL | Status: DC
  Administered 2021-02-25 – 2021-02-26 (×2): 60 mg via ORAL
  Filled 2021-02-24 (×2): qty 3

## 2021-02-24 MED ORDER — DEXTROSE 50 % IV SOLN
INTRAVENOUS | Status: AC
  Administered 2021-02-24: 1 via INTRAVENOUS
  Filled 2021-02-24: qty 50

## 2021-02-24 MED ORDER — SODIUM CHLORIDE 0.9 % IV SOLN: INTRAVENOUS | Status: DC

## 2021-02-24 NOTE — ED Notes (Signed)
Attempted report to [facility] at this time

## 2021-02-24 NOTE — ED Provider Notes (Signed)
[facility] EMERGENCY DEPARTMENT Provider Note   CSN: [id number] Arrival date & time: 02/24/21  1112  An emergency department physician performed an initial assessment on this suspected stroke patient at 64.  History Chief Complaint  Patient presents with  . Code Stroke    Sheila Flynn is a 75 y.o. female.  75 year old female with extensive past medical history below including dementia, type 2 diabetes mellitus, CHF, CKD, CAD, stroke who presents with altered mental status.  Patient arrived via EMS from her nursing facility where staff reported that the patient was at her neurologic baseline when she suddenly became unresponsive and started drooling.  EMS called code stroke in route.  They discovered that her blood glucose was 42 and gave her dextrose.  She has been hypertensive during transport.  She denies any pain including no chest or abdominal pain.  She denies any shortness of breath.  LEVEL 5 CAVEAT DUE TO DEMENTIA  The history is provided by the EMS personnel.       Past Medical History:  Diagnosis Date  . Acute respiratory failure ([place]) 12/21/10  . Angina at rest [facility])   . Aspiration pneumonia ([place])   . CAD (coronary artery disease)    Dr. Matthew Saras  . Chronic constipation   . Chronic diastolic CHF (congestive heart failure) ([place])   . CKD (chronic kidney disease)   . COPD (chronic obstructive pulmonary disease) ([place])   . Delirium, induced by drug ([place])    polypharmacy  . Dementia   . 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 Fall    history of falls  . GERD (gastroesophageal reflux disease)   . Hyperlipidemia LDL goal < 100   . Major depressive disorder   . Malignant essential hypertension with congestive heart failure with chronic kidney disease ([place])   . Migraine    "monthly" (11/06/2014)  . Myocardial infarction ([place])    "I've had about 3"  (11/06/2014)  . On home oxygen therapy    "prn at the nursing home" (11/06/2014)  . Pneumonia 12/21/10  . Pulmonary hypertension ([place])   . Rectal cancer ([place])    s/p colostomy  . Sacral fracture, closed ([place])    s/p kyphoplasty  . Stroke [facility]) 2011   postop  . Stroke [facility]) ?2014   "left side sometimes weaker since" (11/06/2014)  . Thyroid goiter   . Vascular dementia with depressed mood     Patient Active Problem List   Diagnosis Date Noted  . Weakness 02/24/2021  . Seasonal allergic rhinitis 08/25/2016  . Hypertensive heart disease with congestive heart failure ([place]) 05/11/2016  . Status post colostomy ([place]) 01/03/2016  . Dyslipidemia associated with type 2 diabetes mellitus ([place]) 11/04/2015  . GERD (gastroesophageal reflux disease) 03/31/2014  . Depression due to dementia ([place]) 02/17/2014  . Chronic diastolic CHF (congestive heart failure) ([place])   . CAD (coronary artery disease)   . Type 2 diabetes mellitus with neurological complications ([place])   . COPD (chronic obstructive pulmonary disease) ([place])   . Vascular dementia with depressed mood ([place])   . 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 Thyroid goiter   . Chronic constipation   . ADENOCARCINOMA, RECTUM 08/09/2009  . Cerebral artery occlusion with cerebral infarction ([place]) 09/07/2007    Past Surgical History:  Procedure Laterality Date  . ABDOMINAL HYSTERECTOMY    . APPENDECTOMY    .  COLON SURGERY    . COLOSTOMY     rectal ca  . KYPHOPLASTY     sacrum  . right cataract  11-19-2015  . TONSILLECTOMY    . TUBAL LIGATION       OB History   No obstetric history on file.     No family history on file.  Social History   Tobacco Use  . Smoking status: Former Smoker    Packs/day: 0.50    Years: 2.00    Pack years: 1.00    Types: Cigarettes  . Smokeless tobacco: Never Used  . Tobacco comment: "quit smoking cigarettes in 1969"  Substance Use Topics  . Alcohol use: No  . Drug use: Yes     Comment: previously smoked crack, has had positive UDS for amphetamines and narcotics    Home Medications Prior to Admission medications   Medication Sig Start Date End Date Taking? Authorizing Provider  ACETAMINOPHEN PO Take 2 tablets by mouth every 6 (six) hours as needed (pain).   Yes [provider]  amLODipine (NORVASC) 10 MG tablet Take 10 mg by mouth every morning.   Yes [provider]  Artificial Tear Solution (TEARS PURE) 0.1-0.3 % SOLN Place 1 drop into both eyes every 8 (eight) hours as needed (dry eyes).   Yes [provider]  aspirin 81 MG EC tablet Take 81 mg by mouth every morning. Swallow whole.   Yes [provider]  atorvastatin (LIPITOR) 40 MG tablet Take 40 mg by mouth at bedtime.   Yes [provider]  carvedilol (COREG) 6.25 MG tablet Take 6.25 mg by mouth 2 (two) times daily.   Yes [provider]  Cholecalciferol (VITAMIN D-3) 125 MCG (5000 UT) TABS Take 5,000 Units by mouth every morning.   Yes [provider]  hydrALAZINE (APRESOLINE) 25 MG tablet Take 25 mg by mouth 2 (two) times daily with a meal.   Yes [provider]  isosorbide mononitrate (ISMO,MONOKET) 20 MG tablet Take 60 mg by mouth every morning.   Yes [provider]  losartan (COZAAR) 50 MG tablet Take 50 mg by mouth 2 (two) times daily with a meal.   Yes [provider]  Melatonin 10 MG TABS Take 10 mg by mouth at bedtime.   Yes [provider]  nitroGLYCERIN (NITROSTAT) 0.4 MG SL tablet Place 0.4 mg under the tongue every 5 (five) minutes as needed for chest pain.   Yes [provider]  omeprazole (PRILOSEC) 20 MG capsule Take 20 mg by mouth daily at 6 (six) AM.   Yes [provider]  ondansetron (ZOFRAN) 4 MG tablet Take 4 mg by mouth every 8 (eight) hours as needed for nausea or vomiting.   Yes [provider]  oxyCODONE (OXY IR/ROXICODONE) 5 MG immediate release tablet Take 5 mg  by mouth every 12 (twelve) hours as needed (pain).   Yes [provider]  sennosides-docusate sodium (SENOKOT-S) 8.6-50 MG tablet Take 1 tablet by mouth every morning.   Yes [provider]  traZODone (DESYREL) 100 MG tablet Take 100 mg by mouth at bedtime.   Yes [provider]    Allergies    Ace inhibitors  Review of Systems   Review of Systems  Unable to perform ROS: Dementia    Physical Exam Updated Vital Signs BP (!) 157/65   Pulse 84   Temp 98 F (36.7 C) (Oral)   Resp 12   Ht 5\' 7"  (1.702 m)  Wt 69 kg   SpO2 93%   BMI 23.82 kg/m   Physical Exam Constitutional:      General: She is not in acute distress.    Appearance: Normal appearance.  HENT:     Head: Normocephalic and atraumatic.  Eyes:     Conjunctiva/sclera: Conjunctivae normal.     Pupils: Pupils are equal, round, and reactive to light.     Comments: Borderline ptosis L eyelid  Cardiovascular:     Rate and Rhythm: Normal rate and regular rhythm.     Heart sounds: Normal heart sounds. No murmur heard.   Pulmonary:     Effort: Pulmonary effort is normal.     Comments: Upper airway congestion w/ rhonchi b/l Abdominal:     General: Abdomen is flat. Bowel sounds are normal. There is no distension.     Palpations: Abdomen is soft.     Tenderness: There is no abdominal tenderness.  Musculoskeletal:     Right lower leg: No edema.     Left lower leg: No edema.  Skin:    General: Skin is warm and dry.  Neurological:     Mental Status: She is alert.     Deep Tendon Reflexes: Reflexes normal.     Comments: Oriented x 1, slightly garbled speech, mildly asymmetric smile; 5/5 strength x all 4 ext, no clonus  Psychiatric:     Comments: Calm, cooperative     ED Results / Procedures / Treatments   Labs (all labs ordered are listed, but only abnormal results are displayed) Labs Reviewed  CBC - Abnormal; Notable for the following components:      Result Value   MCH 25.5 (*)     All other components within normal limits  COMPREHENSIVE METABOLIC PANEL - Abnormal; Notable for the following components:   Glucose, Bld 43 (*)    Creatinine, Ser 1.76 (*)    GFR, Estimated 30 (*)    All other components within normal limits  I-STAT CHEM 8, ED - Abnormal; Notable for the following components:   BUN 24 (*)    Creatinine, Ser 1.80 (*)    Glucose, Bld 44 (*)    Calcium, Ion 1.05 (*)    All other components within normal limits  CBG MONITORING, ED - Abnormal; Notable for the following components:   Glucose-Capillary 51 (*)    All other components within normal limits  CBG MONITORING, ED - Abnormal; Notable for the following components:   Glucose-Capillary 173 (*)    All other components within normal limits  CBG MONITORING, ED - Abnormal; Notable for the following components:   Glucose-Capillary 120 (*)    All other components within normal limits  CBG MONITORING, ED - Abnormal; Notable for the following components:   Glucose-Capillary 58 (*)    All other components within normal limits  SARS CORONAVIRUS 2 (TAT 6-24 HRS)  PROTIME-INR  APTT  DIFFERENTIAL  URINALYSIS, ROUTINE W REFLEX MICROSCOPIC  RAPID URINE DRUG SCREEN, HOSP PERFORMED  CBG MONITORING, ED    EKG EKG Interpretation  Date/Time:  Monday February 24 2021 11:45:13 EDT Ventricular Rate:  93 PR Interval:  158 QRS Duration: 148 QT Interval:  416 QTC Calculation: 518 R Axis:   80 Text Interpretation: Sinus rhythm Multiform ventricular premature complexes IVCD, consider atypical LBBB No significant change since last tracing Confirmed by Theotis Burrow [phone]) on 02/24/2021 12:26:21 PM   Radiology DG Chest 2 View  Result Date: 02/24/2021 CLINICAL DATA:  Cough. EXAM: CHEST - 2  VIEW COMPARISON:  November 29, 2017. FINDINGS: The heart size is within normal limits. Stable enlarged right paratracheal abnormality is noted consistent with thyroid nodule. Both lungs are clear. The visualized skeletal structures  are unremarkable. IMPRESSION: No active cardiopulmonary disease. Aortic Atherosclerosis (ICD10-I70.0). Electronically Signed   By: Marijo Conception M.D.   On: 02/24/2021 12:58   CT HEAD CODE STROKE WO CONTRAST  Result Date: 02/24/2021 CLINICAL DATA:  Code stroke.  Neuro deficit, acute stroke suspected. EXAM: CT HEAD WITHOUT CONTRAST TECHNIQUE: Contiguous axial images were obtained from the base of the skull through the vertex without intravenous contrast. COMPARISON:  11/29/2017. FINDINGS: Brain: No evidence of acute large vascular territory infarct or acute hemorrhage. Remote lacunar infarcts involving bilateral basal ganglia and corona radiata, right thalamus, and left pons. More confluent/larger remote infarct involving the inferior left cerebellum. No hydrocephalus. No mass lesion or abnormal mass effect. No visible extra-axial fluid collection. Vascular: No hyperdense vessel identified. Calcific atherosclerosis. Skull: No acute fracture. Sinuses/Orbits: Visualized sinuses are clear.  Unremarkable orbits. Other: No mastoid effusions. ASPECTS [facility] Stroke Program Early CT Score) total score (0-10 with 10 being normal): 10. IMPRESSION: 1. No evidence of acute large vascular territory infarct or acute hemorrhage. ASPECTS is 10. 2. Advanced chronic microvascular ischemic disease with multiple old infarcts, as detailed above. An acute white matter infarct could be easily obscured by the patient's white matter disease and MRI could better evaluate for acute infarct if clinically indicated. Findings discussed with Dr. Curly Shores at 11:39 AM via telephone. Electronically Signed   By: Margaretha Sheffield MD   On: 02/24/2021 11:38    Procedures .Critical Care Performed by: Sharlett Iles, MD Authorized by: Sharlett Iles, MD   Critical care provider statement:    Critical care time (minutes):  30   Critical care time was exclusive of:  Separately billable procedures and treating other patients    Critical care was necessary to treat or prevent imminent or life-threatening deterioration of the following conditions:  CNS failure or compromise and endocrine crisis   Critical care was time spent personally by me on the following activities:  Development of treatment plan with patient or surrogate, discussions with consultants, evaluation of patient's response to treatment, examination of patient, obtaining history from patient or surrogate, ordering and performing treatments and interventions, ordering and review of laboratory studies, ordering and review of radiographic studies and re-evaluation of patient's condition     Medications Ordered in ED Medications  sodium chloride flush (NS) 0.9 % injection 3 mL (3 mLs Intravenous Given 02/24/21 1140)  sodium chloride flush (NS) 0.9 % injection 3 mL (has no administration in time range)  0.9 %  sodium chloride infusion (has no administration in time range)  dextrose 5 %-0.9 % sodium chloride infusion ( Intravenous New Bag/Given 02/24/21 1347)  sodium chloride flush (NS) 0.9 % injection 3 mL (3 mLs Intravenous Given 02/24/21 1145)  dextrose 50 % solution 50 mL (1 ampule Intravenous Given 02/24/21 1140)  dextrose 50 % solution 50 mL (50 mLs Intravenous Given 02/24/21 1443)    ED Course  I have reviewed the triage vital signs and the nursing notes.  Pertinent labs & imaging results that were available during my care of the patient were reviewed by me and considered in my medical decision making (see chart for details).    MDM Rules/Calculators/A&P  Patient arrived as code stroke and was immediately taken to CT scan where head CT negative for hemorrhage or acute process.  She was evaluated by Dr. Curly Shores, who felt sx likely 2/2 hypoglycemia and did not recommend tPA.  Blood glucose initially improved after D50 however it continued to drop back down and she eventually required D5 drip to maintain blood glucose.  Lab work shows  normal CBC, creatinine 1.7, UA pending.  Chest x-ray clear.  I have reviewed medication list and I do not see any insulin or other medications that could be contributing to her hypoglycemia.  Attempted bedside swallow in order to feed however patient failed bedside swallow.  We will hold n.p.o. until patient can have formal evaluation.  Discussed admission for persistent hypoglycemia with family medicine teaching service.  Neurology team has ordered MRI. Final Clinical Impression(s) / ED Diagnoses Final diagnoses:  Hypoglycemia  Altered mental status, unspecified altered mental status type    Rx / DC Orders ED Discharge Orders    None       Cythnia Osmun, Wenda Overland, MD 02/24/21 1533

## 2021-02-24 NOTE — ED Notes (Signed)
Urine sent to lab WITH culture. 

## 2021-02-24 NOTE — ED Notes (Signed)
Pt failed swallow screen, pt unable to drink half the water without coughing and spitting up Provider notified and SLP eval ordered, will continue to keep pt NPO

## 2021-02-24 NOTE — Consult Note (Signed)
Neurology Consultation  Reason for Consult: Unresponsiveness Referring Physician: Dr. Rex Kras  CC: Unresponsiveness  History is obtained from: EMS, Chart review, Unable to contact family for collateral  HPI: Sheila Flynn is a 75 y.o. 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 According to facility staff, patient was sitting at a table playing Bingo when she suddenly slumped over, became unresponsive, and started drooling. Her blood pressure was taken and she was found to be hypertensive with a systolic blood pressure greater than 200. On EMS arrival, Sheila Flynn blood pressure was 220/110 and she was found to have a blood glucose of 42. She was given D10 and transferred to the ER for further evaluation. Her blood glucose on arrival was 51 and she was given one ampule of dextrose. Per Sheila Flynn, her blood glucose also dropped yesterday and resolved after she drank some juice at her facility.   Unable to obtain Sheila Flynn's baseline mental and functional status due to inability to reach family via telephone at this time. Sheila Flynn endorses appetite has been just "okay" recently but claims she has not taken any insulin. On chart review, Sheila Flynn was pending hospice admission in May of 2019 due to chronic conditions and poor PO intake with weight loss and functional decline.  Specifically there was concern for potential renal cell carcinoma at the time.  LKW: 10:45 tpa given?: no, hypoglycemia on arrival, suspect low blood glucose as  etiology of unresponsiveness.  IR Thrombectomy? No, low suspicion for LVO- no focal weakness, visual field deficits, or neglect noted.  Modified Rankin Scale: 4-Needs assistance to walk and tend to bodily needs  ROS: A complete ROS was performed and is negative except as noted in the HPI.   Past Medical History:  Diagnosis Date  . Acute respiratory failure ([place]) 12/21/10  . Angina at rest [facility])   . Aspiration pneumonia ([place])   . CAD (coronary artery disease)    Dr. Matthew Saras  . Chronic constipation   . Chronic diastolic CHF (congestive heart failure) ([place])   . CKD (chronic kidney disease)   . COPD (chronic obstructive pulmonary disease) ([place])   . Delirium, induced by drug ([place])    polypharmacy  . Dementia   . 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 Fall    history of falls  . GERD (gastroesophageal reflux disease)   . Hyperlipidemia LDL goal < 100   . Major depressive disorder   . Malignant essential hypertension with congestive heart failure with chronic kidney disease ([place])   . Migraine    "monthly" (11/06/2014)  . Myocardial infarction ([place])    "I've had about 3" (11/06/2014)  . On home oxygen therapy    "prn at the nursing home" (11/06/2014)  . Pneumonia 12/21/10  . Pulmonary hypertension ([place])   . Rectal cancer ([place])    s/p colostomy  . Sacral fracture, closed ([place])    s/p kyphoplasty  . Stroke [facility]) 2011   postop  . Stroke [facility]) ?2014   "left side sometimes weaker since" (11/06/2014)  .  Thyroid goiter   . Vascular dementia with depressed mood    Past Surgical History:  Procedure Laterality Date  . ABDOMINAL HYSTERECTOMY    . APPENDECTOMY    . COLON SURGERY    . COLOSTOMY     rectal ca  . KYPHOPLASTY     sacrum  . right cataract  11-19-2015  . TONSILLECTOMY    . TUBAL LIGATION     No family history on file.  Social History:   reports that she has quit smoking.  Her smoking use included cigarettes. She has a 1.00 pack-year smoking history. She has never used smokeless tobacco. She reports current drug use. She reports that she does not drink alcohol.  Medications Current Outpatient Medications  Medication Instructions  . acetaminophen (TYLENOL) 650 mg, Every 6 hours PRN  . amLODipine (NORVASC) 10 mg, Oral, Daily  . Artificial Tear Solution (TEARS PURE) 0.1-0.3 % SOLN 1 drop, Ophthalmic, Every 8 hours PRN  . aspirin (ASPIRIN EC) 81 mg, Daily  . atorvastatin (LIPITOR) 80 mg, Oral, Daily  . calcitRIOL (ROCALTROL) 0.25 mcg, Oral, Daily  . carvedilol (COREG) 6.25 mg, Oral, 2 times daily with meals  . cephALEXin (KEFLEX) 500 mg, Oral, 4 times daily  . cetirizine (ZYRTEC) 10 mg, Oral, As needed  . Cholecalciferol 50,000 Units, Oral, Take one tablet by mouth one time a day  Starting on the 14 th and ending on the 14 th every month.  . docusate sodium (COLACE) 100 mg, Oral, Daily at bedtime  . Glucagon Emergency 1 mg, Subcutaneous, As needed  . hydrALAZINE (APRESOLINE) 25 mg, Oral, 2 times daily  . isosorbide mononitrate (ISMO) 60 mg, Daily  . losartan (COZAAR) 50 mg, Oral, 2 times daily  . metFORMIN (GLUCOPHAGE) 500 mg, Oral, Daily  . mirtazapine (REMERON) 7.5 mg, Oral, Daily at bedtime  . nitroGLYCERIN (NITROSTAT) 0.4 mg, Sublingual, Every 5 min PRN  . omeprazole (PRILOSEC) 20 mg, Oral, Daily  . PARoxetine (PAXIL) 40 mg, Oral  . potassium chloride SA (K-DUR,KLOR-CON) 20 MEQ tablet 20 mEq, Oral, Daily  . sennosides-docusate sodium (SENOKOT-S) 8.6-50 MG tablet 2 tablets, Every evening  . traZODone (DESYREL) 50 mg, Oral, Daily at bedtime   Exam: Current vital signs: BP (!) 195/74 (BP Location: Right Arm)   Pulse 94   Temp 98 F (36.7 C) (Oral)   Resp 16   Ht 5\' 7"  (1.702 m)   Wt 69 kg   SpO2 96%   BMI 23.82 kg/m  Vital signs in last 24 hours: Weight:  [69 kg] 69 kg (04/18 1100)  GENERAL: Awake, alert, in NAD Head: Normpcephalic and  atraumatic EENT: No OP obstruction but patient seeming to struggle with swallowing oral secretions. Arcus senilis present bilaterally. LUNGS: Audible expiratory wheezing noted, normal respiratory rate CV: Regular rate on cardiac monitor, hypertensive on presentation ABDOMEN: Soft, non-distended Ext: warm, well perfused, without obvious abnormality  NEURO:  Mental Status: Alert and oriented to self and age. She has a history of vascular dementia and is unable to recall the month or year, though she/correctly states her age is 65. She remains amnestic to the events leading up to hospitalization. Her speech is mildly dysarthric and she is mildly aphasic. Naming intermittently intact, repetition and comprehension are intact.  No neglect is noted on examination.  Cranial Nerves:  II: PERRL 3 mm/brisk. Visual fields full.  III, IV, VI: EOMI  V: Sensation is intact to light touch and symmetrical to face.  VII: Face is symmetric resting and  smiling. Questionable intermittent left mouth droop at rest.  VIII: Hearing is intact to voice IX, X: Palate elevation is symmetric.  XI: Normal sternocleidomastoid and trapezius muscle strength XII: Tongue protrudes midline without fasciculations.   Motor: Antigravity movement present throughout without pronator drift on examination. Tone is normal. Bulk is normal.  Sensation: Intact to light touch bilaterally in all four extremities. No extinction to DSS present.  Coordination: FTN intact bilaterally. Unable to assess HKS due to baseline LE weakness- wheelchair bound at baseline since previous stroke DTRs: 2+ and symmetric biceps and patellae Gait- deferred  NIHSS: 1a Level of Conscious.: 0 1b LOC Questions: 1 1c LOC Commands: 0 2 Best Gaze: 0 3 Visual: 0 4 Facial Palsy: 0 5a Motor Arm - left: 0 5b Motor Arm - Right: 0 6a Motor Leg - Left: 0 6b Motor Leg - Right: 0 7 Limb Ataxia: 0 8 Sensory: 0 9 Best Language: 1 10 Dysarthria: 1 11 Extinct. and  Inatten.: 0 TOTAL: 3  Labs I have reviewed labs in epic and the results pertinent to this consultation are: CBC    Component Value Date/Time   WBC 8.6 02/24/2021 1118   RBC 4.90 02/24/2021 1118   HGB 13.6 02/24/2021 1124   HGB 12.9 10/16/2009 1424   HCT 40.0 02/24/2021 1124   HCT 39.5 10/16/2009 1424   PLT 273 02/24/2021 1118   PLT 212 10/16/2009 1424   MCV 84.9 02/24/2021 1118   MCV 84.1 10/16/2009 1424   MCH 25.5 (L) 02/24/2021 1118   MCHC 30.0 02/24/2021 1118   RDW 15.5 02/24/2021 1118   RDW 20.9 (H) 10/16/2009 1424   LYMPHSABS 2.1 02/24/2021 1118   LYMPHSABS 0.5 (L) 10/16/2009 1424   MONOABS 0.6 02/24/2021 1118   MONOABS 0.3 10/16/2009 1424   EOSABS 0.0 02/24/2021 1118   EOSABS 0.1 10/16/2009 1424   BASOSABS 0.0 02/24/2021 1118   BASOSABS 0.0 10/16/2009 1424   CMP     Component Value Date/Time   NA 141 02/24/2021 1124   NA 145 03/04/2017 0000   K 3.9 02/24/2021 1124   CL 105 02/24/2021 1124   CO2 25 11/29/2017 2134   GLUCOSE 44 (LL) 02/24/2021 1124   BUN 24 (H) 02/24/2021 1124   BUN 16 03/04/2017 0000   CREATININE 1.80 (H) 02/24/2021 1124   CREATININE 1.40 (H) 11/12/2014 0000   CALCIUM 9.3 11/29/2017 2134   PROT 6.3 (L) 11/29/2017 2134   ALBUMIN 3.5 11/29/2017 2134   AST 21 11/29/2017 2134   ALT 14 11/29/2017 2134   ALKPHOS 64 11/29/2017 2134   BILITOT 0.5 11/29/2017 2134   GFRNONAA 35 (L) 11/29/2017 2134   GFRAA 40 (L) 11/29/2017 2134   Lipid Panel     Component Value Date/Time   CHOL 154 03/04/2017 0000   TRIG 108 03/04/2017 0000   HDL 45 03/04/2017 0000   CHOLHDL 4.4 08/13/2014 0540   VLDL 21 08/13/2014 0540   LDLCALC 87 03/04/2017 0000   Imaging I have reviewed the images obtained: CT-scan of the brain 1. No evidence of acute large vascular territory infarct or acute hemorrhage. ASPECTS is 10. 2. Advanced chronic microvascular ischemic disease with multiple old infarcts, as detailed above. An acute Flynn matter infarct could be easily  obscured by the patient's Flynn matter disease and MRI could better evaluate for acute infarct if clinically indicated.  Assessment: 75 year old female who presented for evaluation of unresponsiveness at her nursing facility this morning and found to be hypertensive and hypoglycemic. Improvement in  level of consciousness and mental status following dextrose administration.  - Patient endorses poor appetite and intake lately and multiple episodes of hypoglycemia at facility.   - CT head non-contrast with advanced chronic microvascular ischemic disease with multiple old infarcts but without acute intracranial abnormality.  - Presentation most likely associated with initial blood glucose of 42 with improvement to 51 on hospital arrival versus acute ischemia / infarct, though less likely due to rapid improvement in level of consciousness with dextrose administration. Low suspicion for acute ischemia at this time. Will obtain MRI brain  Recommendations: - MRI brain without contrast; further recommendations if acute findings present - If MRI negative, no further neurology work up needed at this time.  - Management of hypoglycemia per primary and ED teams as you are  Pt seen by NP/Neuro and later by MD. Note/plan to be edited by MD as needed.  Anibal Henderson, AGAC-NP [facility] Pager: [phone]  Attending Neurologist's note:  I personally saw this patient, gathering history, performing a full neurologic examination, reviewing relevant labs, personally reviewing relevant imaging including head CT, and formulated the assessment and plan, adding the note above for completeness and clarity to accurately reflect my thoughts

## 2021-02-24 NOTE — ED Notes (Signed)
Patient at this time in bed resting quietly. Noted at this time patient's colostomy site is leaking stool. Area cleaned and placed a barrier between patient's skin, order to be placed for new bag.

## 2021-02-24 NOTE — ED Notes (Signed)
Patient's colostomy changed at this time. Tolerated well. Patient to MRI.

## 2021-02-24 NOTE — Progress Notes (Signed)
FPTS Interim Progress Note  S: Went to bedside to assess patient.  No concerns at this time.  Patient is thirsty and wanting something to drink, also her head is uncomfortable as her bed in the ED does not have a pillow.  Gave patient orange juice.  Observed by the RN and tolerated well without signs or symptoms of aspiration or coughing.  O: BP (!) 162/71   Pulse 73   Temp 99.2 F (37.3 C) (Oral)   Resp 18   Ht 5\' 7"  (3.646 m)   Wt 67.4 kg   SpO2 99%   BMI 23.27 kg/m   General: Elderly female resting in bed comfortably Neuro: Alert, interactive, left facial droop noted (baseline for her)  A/P: Hypoglycemia Etiology unclear at this time, possibly related to suspected RCC or could consider insulinoma.  She has had multiple recurrent episodes of hypoglycemia despite receiving amps of D50 and receiving dextrose containing fluids.  She became hypoglycemic again with glucose 52 most recently at 1900 s/p amp of D50 with improvement.  Glucose stable at this time, most recent glucose 105.  We will continue current plan with hourly CBG checks.  Patient passed bedside swallow evaluation so home medications were reviewed and reordered as appropriate.  Diet ordered, hopefully will help her maintain euglycemia.  Zola Button, MD 02/24/2021, 9:58 PM PGY-1, [facility] pager [phone]

## 2021-02-24 NOTE — ED Triage Notes (Signed)
Pt arrived by EMS from [facility] facility. Staff states pt was normal then became unresponsive and started drooling  Code stroke was called  Medics report CGB 35 and hypertensive with 935L systolic

## 2021-02-24 NOTE — H&P (Addendum)
[facility] Admission History and Physical Service Pager: [phone]  Patient name: Sheila Flynn Medical record number: [id number] Date of birth: 21-Apr-1946 Age: 75 y.o. Gender: female  Primary Care Provider: Patient, No Pcp Per (Inactive) Consultants: Neuro Code Status: FULL Preferred Emergency Contact: Daughter Helene Kelp [phone]  Chief Complaint: weakness, facial drooping, unresponsiveness  Assessment and Plan: Sheila Flynn is a 75 y.o. female presenting via EMS with right sided weakness and facial drooping, found to be persistently hypoglycemic. PMH is significant for diastolic CHF, stroke, CAD, COPD, T2DM, GERD, rectal ca s/p colostomy, vascular dementia, depression.   Right sided weakness, right sided facial droop  Hypoglycemia  Hx stroke Patient presents to ED via EMS for unresponsiveness. Was reportedly at her neurologic baseline at her nursing facility this morning when she suddenly became less responsive and started drooling. EMS incoded with CODE STROKE after examining patient and finding right-sided facial drooping and weakness. They also found her to have glucose of 42, administered dextrose, after which her unilateral symptoms improved.  However, the patient's glucose did continue to drop back into the 50s-70s throughout the day after this.  CT head on presentation was negative for acute ischemic process, but did demonstrate old infarcts and chronic vascular disease. Stroke team declined tPA, felt that her symptoms more likely due to hypoglycemia. ED provider found that despite initial improvement in symptoms and serum glucose after EMS dextrose administration, patient's CBG decreased again to the 50s; she was started on D5NS drip to maintain euglycemia. CXR unremarkable. CBC with differential and CMP unremarkable aside from glucose of 44 and creatinine 1.80. UA showed 50 glucose, 100 protein, rare bacteria. UDS negative. RUQ ultrasound  negative for hepatic structural abnormality. Respiratory panel pending at this time. Patient A&Ox3 on admission, denies taking insulin, glyburides, or other medications for her type 2 diabetes. Phone call to her nursing facility, [facility] in [place] confirms her medication list, which does not include any T2DM meds and no nursing home residents in the rooms around her with any of those meds. Nursing home front desk staff confirm Sheila Flynn did not have any visitors this morning prior to symptom onset, no friends, family, or acquaintances. Differential includes accidental exogenous insulin or glyburide, fasting state, pancreatic insufficiency, and hepatic insufficiencies.  - admit to progressive with Dr. Owens Shark attending - stroke team consulted, appreciate recommendations  - SLP for swallow study, failed bedside swallow with nurses - awaiting MR brain results - awaiting labs: A1c, TSH, ETOH, magnesium, phosphorus, sulfonurea panel - IVF with D5NS @50mL /hr PLUS NS @50mL /hr - AM CBC, CMP -CBG checks every hour - neuro checks q4 x4 occurrences - vitals per unit routine - continuous cardiac monitoring - PT/OT eval and treat -If patient has another low glucose will get random insulin, C-peptide, beta hydroxybutyrate acid -We will check sulfonylurea panel  Elevated Creatinine  Glucosuria Creatinine on admission 1.8. Baseline per chart review 1.3-1.45. No CKD diagnosis in chart. Likely CKD due to longstanding T2DM.  - consider renal US tomorrow - AM CMP  Hypertension Hypertensive on arrival, with systolics [id number]. At home takes amlodipine 10 mg qAM, coreg 6.25 mg BID, hydralazine 25 mg BID, and losartan 50 mg BID.  - hold while NPO pending swallow study with SLP -We will do IV hydralazine while n.p.o.  CHF  CAD  HLD Home meds include atorvastatin 40 mg qHS, isosorbide 60 mg qAM, nitroglycerin sublingual PRN.  - hold while NPO pending swallow study with SLP  GERD Home  meds of  omeprazole 20 mg daily, zofran 4 mg PRN.  - hold while NPO pending swallow study with SLP  Vascular dementia  Depression  Insomnia At home takes trazodone 100 mg qHS, melatonin 10 mg qHS.  - hold while NPO pending swallow study with SLP  Rectal cancer s/p colostomy Normal colostomy care.   COPD Listed in chart, but no home meds for this. Currently SpO2 normal on room air.   T2DM Listed in chart, but no home meds for this.  Chronic constipation Home med of senokot qAM.  - hold while NPO pending swallow study with SLP  Low vitamin D Home med of cholecalciferol 5,000 units qAM.  - hold while NPO pending swallow study with SLP  Dry Eyes Artificial tears PRN.   Prolonged QT: Initial EKG shows QTC of 495. -Avoid QT prolonging agents  FEN/GI: NPO until SLP swallow study Prophylaxis: Lovenox (<30 mL/min CrCl)  Disposition: Progressive  History of Present Illness:  Sheila Flynn is a 75 y.o. female presenting with right sided weakness, facial drooping, drooling, and decreased responsiveness.   Patient presents to ED via EMS for unresponsiveness. Was reportedly at her neurologic baseline at her nursing facility this morning when she suddenly became less responsive and started drooling. EMS incoded with CODE STROKE after examining patient and finding right-sided facial drooping and weakness. They also found her to have glucose of 42, administered dextrose, after which her unilateral symptoms improved.  Patient is A&Ox3. She reports feeling pretty well, couldn't tell her blood sugar was low. Blood sugar was also low last night, improved with juice. She was asymptomatic with that episode too. Reports she normally eats three full meals a day with protein, carbs, and veggies. Didn't eat much for breakfast this morning, just had some juice; she simply didn't feel like eating. No one in rooms around her takes medication for diabetes that she knows of, nor does anyone she ate breakfast  with this morning. Patient reports feeling fine, denies any cough, congestion, SOB, CP, n/v/d, myalgias.    Phone call to her nursing facility, [facility] in [place] confirms her medication list, which does not include any T2DM meds and no nursing home residents in the rooms around her with any of those meds. Nursing home front desk staff confirm Ms. Cheuvront did not have any visitors this morning prior to symptom onset, no friends, family, or acquaintances.   Review Of Systems: Per HPI with the following additions:   Review of Systems  Constitutional: Negative for fatigue and fever.  HENT: Negative for sneezing and sore throat.   Respiratory: Negative for cough and shortness of breath.   Cardiovascular: Negative for chest pain.  Musculoskeletal: Negative for myalgias.     Patient Active Problem List   Diagnosis Date Noted  . Weakness 02/24/2021  . Seasonal allergic rhinitis 08/25/2016  . Hypertensive heart disease with congestive heart failure ([place]) 05/11/2016  . Status post colostomy ([place]) 01/03/2016  . Dyslipidemia associated with type 2 diabetes mellitus ([place]) 11/04/2015  . GERD (gastroesophageal reflux disease) 03/31/2014  . Depression due to dementia ([employer]) 02/17/2014  . Chronic diastolic CHF (congestive heart failure) ([place])   . CAD (coronary artery disease)   . Type 2 diabetes mellitus with neurological complications ([place])   . COPD (chronic obstructive pulmonary disease) ([place])   . Vascular dementia with depressed mood ([place])   . 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 Thyroid goiter   . Chronic constipation   .  ADENOCARCINOMA, RECTUM 08/09/2009  . Cerebral artery occlusion with cerebral infarction ([place]) 09/07/2007    Past Medical History: Past Medical History:  Diagnosis Date  . Acute respiratory failure ([place]) 12/21/10  . Angina at rest [facility])   . Aspiration pneumonia ([place])   . CAD (coronary artery disease)    Dr. Matthew Saras  . Chronic constipation    . Chronic diastolic CHF (congestive heart failure) ([place])   . CKD (chronic kidney disease)   . COPD (chronic obstructive pulmonary disease) ([place])   . Delirium, induced by drug ([place])    polypharmacy  . Dementia   . 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 Fall    history of falls  . GERD (gastroesophageal reflux disease)   . Hyperlipidemia LDL goal < 100   . Major depressive disorder   . Malignant essential hypertension with congestive heart failure with chronic kidney disease ([place])   . Migraine    "monthly" (11/06/2014)  . Myocardial infarction ([place])    "I've had about 3" (11/06/2014)  . On home oxygen therapy    "prn at the nursing home" (11/06/2014)  . Pneumonia 12/21/10  . Pulmonary hypertension ([place])   . Rectal cancer ([place])    s/p colostomy  . Sacral fracture, closed ([place])    s/p kyphoplasty  . Stroke [facility]) 2011   postop  . Stroke [facility]) ?2014   "left side sometimes weaker since" (11/06/2014)  . Thyroid goiter   . Vascular dementia with depressed mood     Past Surgical History: Past Surgical History:  Procedure Laterality Date  . ABDOMINAL HYSTERECTOMY    . APPENDECTOMY    . COLON SURGERY    . COLOSTOMY     rectal ca  . KYPHOPLASTY     sacrum  . right cataract  11-19-2015  . TONSILLECTOMY    . TUBAL LIGATION      Social History: Social History   Tobacco Use  . Smoking status: Former Smoker    Packs/day: 0.50    Years: 2.00    Pack years: 1.00    Types: Cigarettes  . Smokeless tobacco: Never Used  . Tobacco comment: "quit smoking cigarettes in 1969"  Substance Use Topics  . Alcohol use: No  . Drug use: Yes    Comment: previously smoked crack, has had positive UDS for amphetamines and narcotics   Additional social history: None Please also refer to relevant sections of EMR.  Allergies and Medications: Allergies  Allergen Reactions  . Ace Inhibitors  Swelling   No current facility-administered medications on file prior to encounter.   Current Outpatient Medications on File Prior to Encounter  Medication Sig Dispense Refill  . ACETAMINOPHEN PO Take 2 tablets by mouth every 6 (six) hours as needed (pain).    Marland Kitchen amLODipine (NORVASC) 10 MG tablet Take 10 mg by mouth every morning.    . Artificial Tear Solution (TEARS PURE) 0.1-0.3 % SOLN Place 1 drop into both eyes every 8 (eight) hours as needed (dry eyes).    Marland Kitchen aspirin 81 MG EC tablet Take 81 mg by mouth every morning. Swallow whole.    Marland Kitchen atorvastatin (LIPITOR) 40 MG tablet Take 40 mg by mouth at bedtime.    . carvedilol (COREG) 6.25 MG tablet Take 6.25 mg by mouth 2 (two) times daily.    . Cholecalciferol (VITAMIN D-3) 125 MCG (5000 UT) TABS Take 5,000 Units by mouth every morning.    Marland Kitchen  hydrALAZINE (APRESOLINE) 25 MG tablet Take 25 mg by mouth 2 (two) times daily with a meal.    . isosorbide mononitrate (ISMO,MONOKET) 20 MG tablet Take 60 mg by mouth every morning.    Marland Kitchen losartan (COZAAR) 50 MG tablet Take 50 mg by mouth 2 (two) times daily with a meal.    . Melatonin 10 MG TABS Take 10 mg by mouth at bedtime.    . nitroGLYCERIN (NITROSTAT) 0.4 MG SL tablet Place 0.4 mg under the tongue every 5 (five) minutes as needed for chest pain.    Marland Kitchen omeprazole (PRILOSEC) 20 MG capsule Take 20 mg by mouth daily at 6 (six) AM.    . ondansetron (ZOFRAN) 4 MG tablet Take 4 mg by mouth every 8 (eight) hours as needed for nausea or vomiting.    Marland Kitchen oxyCODONE (OXY IR/ROXICODONE) 5 MG immediate release tablet Take 5 mg by mouth every 12 (twelve) hours as needed (pain).    Marland Kitchen sennosides-docusate sodium (SENOKOT-S) 8.6-50 MG tablet Take 1 tablet by mouth every morning.    . traZODone (DESYREL) 100 MG tablet Take 100 mg by mouth at bedtime.      Objective: BP (!) 157/65   Pulse 84   Temp 98 F (36.7 C) (Oral)   Resp 12   Ht 5\' 7"  (1.702 m)   Wt 69 kg   SpO2 93%   BMI 23.82 kg/m  Exam: General: awake,  alert, oriented x3, NAD Cardiovascular: RRR, no murmur Respiratory: CTAB Gastrointestinal: soft, non-tender Neuro: cranial nerves II-X intact, however patient does have left facial droop at baseline, grip strength 5/5 and equal, left upper extremity 4/5 strength, right upper extremity 5/5 strength, BLE 4/5 and equal Psych: responding appropriately, appears to have normal insight and judgement  Labs and Imaging: CBC BMET  Recent Labs  Lab 02/24/21 1118 02/24/21 1124  WBC 8.6  --   HGB 12.5 13.6  HCT 41.6 40.0  PLT 273  --    Recent Labs  Lab 02/24/21 1118 02/24/21 1124  NA 141 141  K 4.0 3.9  CL 103 105  CO2 27  --   BUN 22 24*  CREATININE 1.76* 1.80*  GLUCOSE 43* 44*  CALCIUM 9.5  --      EKG: sinus rhythm, unchanged from prior, wide QRS, possible LBBB  CHEST - 2 VIEW 02/24/2021 COMPARISON:  November 29, 2017. FINDINGS: The heart size is within normal limits. Stable enlarged right paratracheal abnormality is noted consistent with thyroid nodule. Both lungs are clear. The visualized skeletal structures are unremarkable. IMPRESSION: No active cardiopulmonary disease.  CT HEAD WITHOUT CONTRAST 02/24/2021 IMPRESSION: 1. No evidence of acute large vascular territory infarct or acute hemorrhage. ASPECTS is 10. 2. Advanced chronic microvascular ischemic disease with multiple old infarcts, as detailed above. An acute white matter infarct could be easily obscured by the patient's white matter disease and MRI could better evaluate for acute infarct if clinically indicated. Findings discussed with Dr. Curly Shores at 11:39 AM via telephone.   Ezequiel Essex, MD 02/24/2021, 3:27 PM PGY-1, [place] Intern pager: [phone], text pages welcome  Upper Level Addendum:  I have seen and evaluated this patient along with Dr. Jeani Hawking and reviewed the above note, making necessary revisions as appropriate.  I agree with the medical decision making and physical exam  as noted above.  Lurline Del, DO PGY-2  [facility]

## 2021-02-24 NOTE — ED Notes (Signed)
Patient was given orange juice by MD, tolerated well, no S/S of aspiration or coughing.

## 2021-02-24 NOTE — ED Notes (Signed)
Pat returned from MRI, to [place] at this time

## 2021-02-24 NOTE — ED Notes (Signed)
Returned from US at this time.

## 2021-02-24 NOTE — Code Documentation (Signed)
Stroke Response Nurse Documentation Code Documentation  Sheila Flynn is a 75 y.o. female arriving to [facility] ED via [facility] on 02/24/2021 with past medical hx of stroke, vascular dementia, diabetes mellitus type II, CKD. Code stroke was activated by EMS. Patient from [facility] skilled nursing facility where she was LKW at 1045 while playing bingo and now complaining of right sided weakness and right facial droop. EMS found pt to be hypoglycemic with a CBG of 42. CBG 51 on arrival to ED.   On aspirin 81 mg daily. Stroke team at the bedside on patient arrival. Labs drawn and patient cleared for CT by Dr. Regenia Skeeter. Patient to CT with team. NIHSS 4, see documentation for details and code stroke times. Patient with disoriented, left facial droop, Expressive aphasia  and dysarthria  on exam. The following imaging was completed: CT. Patient is not a candidate for tPA due to symptoms resolving with blood sugar correction.  Care/Plan: q2h NIHSS and VS. Treat hypoglycemia. Bedside handoff with ED RN: Agnes Lawrence Jonia Oakey  Rapid Response RN

## 2021-02-25 ENCOUNTER — Observation Stay (HOSPITAL_COMMUNITY): Payer: Medicare (Managed Care)

## 2021-02-25 ENCOUNTER — Encounter (HOSPITAL_COMMUNITY): Payer: Self-pay | Admitting: Family Medicine

## 2021-02-25 DIAGNOSIS — D631 Anemia in chronic kidney disease: Secondary | ICD-10-CM | POA: Diagnosis present

## 2021-02-25 DIAGNOSIS — I69392 Facial weakness following cerebral infarction: Secondary | ICD-10-CM | POA: Diagnosis not present

## 2021-02-25 DIAGNOSIS — N2889 Other specified disorders of kidney and ureter: Secondary | ICD-10-CM

## 2021-02-25 DIAGNOSIS — J449 Chronic obstructive pulmonary disease, unspecified: Secondary | ICD-10-CM | POA: Diagnosis present

## 2021-02-25 DIAGNOSIS — K5909 Other constipation: Secondary | ICD-10-CM | POA: Diagnosis present

## 2021-02-25 DIAGNOSIS — F5105 Insomnia due to other mental disorder: Secondary | ICD-10-CM | POA: Diagnosis present

## 2021-02-25 DIAGNOSIS — N132 Hydronephrosis with renal and ureteral calculous obstruction: Secondary | ICD-10-CM | POA: Diagnosis present

## 2021-02-25 DIAGNOSIS — D649 Anemia, unspecified: Secondary | ICD-10-CM

## 2021-02-25 DIAGNOSIS — E785 Hyperlipidemia, unspecified: Secondary | ICD-10-CM | POA: Diagnosis present

## 2021-02-25 DIAGNOSIS — R4182 Altered mental status, unspecified: Secondary | ICD-10-CM | POA: Diagnosis not present

## 2021-02-25 DIAGNOSIS — I5032 Chronic diastolic (congestive) heart failure: Secondary | ICD-10-CM | POA: Diagnosis present

## 2021-02-25 DIAGNOSIS — E1149 Type 2 diabetes mellitus with other diabetic neurological complication: Secondary | ICD-10-CM | POA: Diagnosis not present

## 2021-02-25 DIAGNOSIS — E2740 Unspecified adrenocortical insufficiency: Secondary | ICD-10-CM

## 2021-02-25 DIAGNOSIS — K219 Gastro-esophageal reflux disease without esophagitis: Secondary | ICD-10-CM | POA: Diagnosis present

## 2021-02-25 DIAGNOSIS — Z20822 Contact with and (suspected) exposure to covid-19: Secondary | ICD-10-CM | POA: Diagnosis present

## 2021-02-25 DIAGNOSIS — R531 Weakness: Secondary | ICD-10-CM | POA: Diagnosis present

## 2021-02-25 DIAGNOSIS — C649 Malignant neoplasm of unspecified kidney, except renal pelvis: Secondary | ICD-10-CM

## 2021-02-25 DIAGNOSIS — Z85048 Personal history of other malignant neoplasm of rectum, rectosigmoid junction, and anus: Secondary | ICD-10-CM

## 2021-02-25 DIAGNOSIS — F0150 Vascular dementia without behavioral disturbance: Secondary | ICD-10-CM | POA: Diagnosis present

## 2021-02-25 DIAGNOSIS — M7989 Other specified soft tissue disorders: Secondary | ICD-10-CM | POA: Diagnosis not present

## 2021-02-25 DIAGNOSIS — E1122 Type 2 diabetes mellitus with diabetic chronic kidney disease: Secondary | ICD-10-CM | POA: Diagnosis present

## 2021-02-25 DIAGNOSIS — N1832 Chronic kidney disease, stage 3b: Secondary | ICD-10-CM | POA: Diagnosis present

## 2021-02-25 DIAGNOSIS — C642 Malignant neoplasm of left kidney, except renal pelvis: Secondary | ICD-10-CM | POA: Diagnosis present

## 2021-02-25 DIAGNOSIS — N189 Chronic kidney disease, unspecified: Secondary | ICD-10-CM

## 2021-02-25 DIAGNOSIS — I130 Hypertensive heart and chronic kidney disease with heart failure and stage 1 through stage 4 chronic kidney disease, or unspecified chronic kidney disease: Secondary | ICD-10-CM | POA: Diagnosis present

## 2021-02-25 DIAGNOSIS — E162 Hypoglycemia, unspecified: Secondary | ICD-10-CM | POA: Diagnosis not present

## 2021-02-25 DIAGNOSIS — E11649 Type 2 diabetes mellitus with hypoglycemia without coma: Secondary | ICD-10-CM | POA: Diagnosis present

## 2021-02-25 DIAGNOSIS — E1151 Type 2 diabetes mellitus with diabetic peripheral angiopathy without gangrene: Secondary | ICD-10-CM | POA: Diagnosis present

## 2021-02-25 DIAGNOSIS — K8689 Other specified diseases of pancreas: Secondary | ICD-10-CM | POA: Diagnosis present

## 2021-02-25 DIAGNOSIS — I69354 Hemiplegia and hemiparesis following cerebral infarction affecting left non-dominant side: Secondary | ICD-10-CM | POA: Diagnosis not present

## 2021-02-25 DIAGNOSIS — I69322 Dysarthria following cerebral infarction: Secondary | ICD-10-CM | POA: Diagnosis not present

## 2021-02-25 DIAGNOSIS — F32A Depression, unspecified: Secondary | ICD-10-CM | POA: Diagnosis present

## 2021-02-25 DIAGNOSIS — I2510 Atherosclerotic heart disease of native coronary artery without angina pectoris: Secondary | ICD-10-CM | POA: Diagnosis present

## 2021-02-25 DIAGNOSIS — Z933 Colostomy status: Secondary | ICD-10-CM | POA: Diagnosis not present

## 2021-02-25 LAB — GLUCOSE, CAPILLARY
Glucose-Capillary: 101 mg/dL — ABNORMAL HIGH (ref 70–99)
Glucose-Capillary: 105 mg/dL — ABNORMAL HIGH (ref 70–99)
Glucose-Capillary: 105 mg/dL — ABNORMAL HIGH (ref 70–99)
Glucose-Capillary: 109 mg/dL — ABNORMAL HIGH (ref 70–99)
Glucose-Capillary: 111 mg/dL — ABNORMAL HIGH (ref 70–99)
Glucose-Capillary: 114 mg/dL — ABNORMAL HIGH (ref 70–99)
Glucose-Capillary: 115 mg/dL — ABNORMAL HIGH (ref 70–99)
Glucose-Capillary: 117 mg/dL — ABNORMAL HIGH (ref 70–99)
Glucose-Capillary: 117 mg/dL — ABNORMAL HIGH (ref 70–99)
Glucose-Capillary: 126 mg/dL — ABNORMAL HIGH (ref 70–99)
Glucose-Capillary: 132 mg/dL — ABNORMAL HIGH (ref 70–99)
Glucose-Capillary: 143 mg/dL — ABNORMAL HIGH (ref 70–99)
Glucose-Capillary: 145 mg/dL — ABNORMAL HIGH (ref 70–99)
Glucose-Capillary: 149 mg/dL — ABNORMAL HIGH (ref 70–99)
Glucose-Capillary: 158 mg/dL — ABNORMAL HIGH (ref 70–99)
Glucose-Capillary: 78 mg/dL (ref 70–99)
Glucose-Capillary: 80 mg/dL (ref 70–99)
Glucose-Capillary: 88 mg/dL (ref 70–99)
Glucose-Capillary: 88 mg/dL (ref 70–99)
Glucose-Capillary: 89 mg/dL (ref 70–99)
Glucose-Capillary: 94 mg/dL (ref 70–99)

## 2021-02-25 LAB — CBC
HCT: 33.5 % — ABNORMAL LOW (ref 36.0–46.0)
Hemoglobin: 10.4 g/dL — ABNORMAL LOW (ref 12.0–15.0)
MCH: 25.9 pg — ABNORMAL LOW (ref 26.0–34.0)
MCHC: 31 g/dL (ref 30.0–36.0)
MCV: 83.5 fL (ref 80.0–100.0)
Platelets: 214 10*3/uL (ref 150–400)
RBC: 4.01 MIL/uL (ref 3.87–5.11)
RDW: 15.5 % (ref 11.5–15.5)
WBC: 5.2 10*3/uL (ref 4.0–10.5)
nRBC: 0 % (ref 0.0–0.2)

## 2021-02-25 LAB — COMPREHENSIVE METABOLIC PANEL
ALT: 14 U/L (ref 0–44)
AST: 19 U/L (ref 15–41)
Albumin: 3 g/dL — ABNORMAL LOW (ref 3.5–5.0)
Alkaline Phosphatase: 47 U/L (ref 38–126)
Anion gap: 5 (ref 5–15)
BUN: 16 mg/dL (ref 8–23)
CO2: 30 mmol/L (ref 22–32)
Calcium: 8.7 mg/dL — ABNORMAL LOW (ref 8.9–10.3)
Chloride: 103 mmol/L (ref 98–111)
Creatinine, Ser: 1.79 mg/dL — ABNORMAL HIGH (ref 0.44–1.00)
GFR, Estimated: 30 mL/min — ABNORMAL LOW (ref >60)
Glucose, Bld: 94 mg/dL (ref 70–99)
Potassium: 3.5 mmol/L (ref 3.5–5.1)
Sodium: 138 mmol/L (ref 135–145)
Total Bilirubin: 0.6 mg/dL (ref 0.3–1.2)
Total Protein: 5.9 g/dL — ABNORMAL LOW (ref 6.5–8.1)

## 2021-02-25 LAB — MRSA PCR SCREENING: MRSA by PCR: NEGATIVE

## 2021-02-25 LAB — CORTISOL-AM, BLOOD: Cortisol - AM: 3.7 ug/dL — ABNORMAL LOW (ref 6.7–22.6)

## 2021-02-25 MED ORDER — FLUDROCORTISONE ACETATE 0.1 MG PO TABS
0.1000 mg | ORAL_TABLET | Freq: Every day | ORAL | Status: DC
  Administered 2021-02-25 – 2021-02-27 (×3): 0.1 mg via ORAL
  Filled 2021-02-25 (×3): qty 1

## 2021-02-25 NOTE — Progress Notes (Signed)
Treatment Plan Update   Attempted to placed consult for endocrinology evaluation given patient with severe hypoglycemia on admission.  Endocrinologist requested MRI for consult Hospital.  Suggestion to discuss case with pediatrics endocrine, Dr.Brennan.   Reviewed medications and could be related to patient's home beta blocker therapy but rarely associated with BB however an etiology to consider.   Other likely diagnosis to consider pancreatic exocrine insufficiency.  Recommended Labs:  - fecal elastase -amylase -lipase  Could consider MRI/CT to evaluate for pancreatic morphology. Could also consider malabsorption, inadequate glucose absorption, chronic bowel movement promoting medications, chronic diarrhea, renal losses, insulinoma (low suspicion given hx) & hepatic dz.     Sheila Foster, MD  [facility], PGY-2  [place] Intern Pager [phone]

## 2021-02-25 NOTE — Plan of Care (Signed)

## 2021-02-25 NOTE — Progress Notes (Signed)
Just relayed afternoon plan to nurse over phone. Will see if patient tolerates a decrease in D5NS rate and maintains euglycemia.   - decrease D5NS from 50 to 25 mL/hr - CBG q1 hr - if stable glucose after 4 hours, will maintain lowered D5NS rate - if hypoglycemic, will return to D5NS at 50 mL/hr and CBG q4  Formal progress note to follow.   Ezequiel Essex, MD

## 2021-02-25 NOTE — Progress Notes (Signed)
Spoke with Dr. Jacalyn Lefevre, radiology, about possible pancreatic imaging. He recommends MR abdomen w+w/o for best charactering of any possible masses. Asks if we are able to wait a day or two for kidney function to improve or if this needs to be done urgently. We could do a MR abd w/o contrast but we'd need to go back and repeat with contrast if there are any abnormal findings.   As of right now, will wait for renal function to improve over a day or two and then obtain MR abdomen with and without contrast.   Ezequiel Essex, MD

## 2021-02-25 NOTE — Consult Note (Signed)
New Hematology/Oncology Consult   Requesting BO:[id number] [place]       Reason for Consult: Renal mass, hypoglycemia  HPI: Sheila Flynn has a complex medical history including history of a CVA, renal mass, and rectal cancer.  She was transferred to the emergency room from a nursing facility on 02/24/2021 with altered mental status.  A CT head revealed no acute change.  The glucose returned at 44.  She was treated with D50, but the glucose dropped and she was placed on a D5 drip.  She was admitted for further evaluation.  She was noted to have a left renal mass on an abdomen CT in 2016.  The mass was enlarged on a CT 10/21/2017.  Sheila Flynn was diagnosed with rectal cancer in 2010 and underwent an APR procedure on 12/11/2009 for a pT2,pN0 tumor.  She saw Dr. Alen Blew.      Past Medical History:  Diagnosis Date  . Acute respiratory failure ([place]) 12/21/10  . Aspiration pneumonia ([place])   . CAD (coronary artery disease)    Dr. Matthew Saras  . Chronic constipation   . Chronic diastolic CHF (congestive heart failure) ([place])   . CKD (chronic kidney disease)   . COPD (chronic obstructive pulmonary disease) ([place])   . Delirium, induced by drug    polypharmacy  . Dementia ([place])   . 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 GERD (gastroesophageal reflux disease)   . Hypertensive heart disease with congestive heart failure ([place]) 05/11/2016  . Major depressive disorder   . Malignant essential hypertension with congestive heart failure with chronic kidney disease ([place])   . Migraine    "monthly" (11/06/2014)  . Myocardial infarction ([place])    "I've had about 3" (11/06/2014)  . On home oxygen therapy    "prn at the nursing home" (11/06/2014)  . Pulmonary hypertension ([place])   . Rectal cancer ([place])    s/p colostomy  . Sacral fracture, closed ([place])    s/p kyphoplasty  . Stroke [facility]) 2011   postop  . Stroke [facility])  ?2014   "left side sometimes weaker since" (11/06/2014)  . Thyroid goiter   . Vascular dementia with depressed mood ([place])   :  Past Surgical History:  Procedure Laterality Date  . ABDOMINAL HYSTERECTOMY    . APPENDECTOMY    . COLON SURGERY    . COLOSTOMY     rectal ca  . KYPHOPLASTY     sacrum  . right cataract  11-19-2015  . TONSILLECTOMY    . TUBAL LIGATION    :   Current Facility-Administered Medications:  .  0.9 %  sodium chloride infusion, 250 mL, Intravenous, PRN, Ezequiel Essex, MD .  amLODipine (NORVASC) tablet 10 mg, 10 mg, Oral, q morning, Zola Button, MD, 10 mg at 02/25/21 0850 .  aspirin chewable tablet 81 mg, 81 mg, Oral, q morning, Zola Button, MD, 81 mg at 02/25/21 0839 .  atorvastatin (LIPITOR) tablet 40 mg, 40 mg, Oral, QHS, Zola Button, MD, 40 mg at 02/24/21 2213 .  carvedilol (COREG) tablet 6.25 mg, 6.25 mg, Oral, BID, Zola Button, MD, 6.25 mg at 02/25/21 8527 .  cholecalciferol (VITAMIN D3) tablet 5,000 Units, 5,000 Units, Oral, q morning, Zola Button, MD, 5,000 Units at 02/25/21 0839 .  dextrose 5 %-0.9 % sodium chloride infusion, , Intravenous, Continuous, Simmons-Robinson, Makiera, MD, Last Rate: 25 mL/hr at 02/25/21 1201, Rate Change at 02/25/21 1201 .  enoxaparin (LOVENOX) injection 30 mg, 30 mg, Subcutaneous, Q24H, Ezequiel Essex, MD .  fludrocortisone (FLORINEF) tablet 0.1 mg, 0.1 mg, Oral, Daily, Simmons-Robinson, Makiera, MD .  hydrALAZINE (APRESOLINE) tablet 25 mg, 25 mg, Oral, BID WC, Zola Button, MD, 25 mg at 02/25/21 0834 .  isosorbide mononitrate (ISMO) tablet 60 mg, 60 mg, Oral, q morning, Zola Button, MD, 60 mg at 02/25/21 0851 .  losartan (COZAAR) tablet 50 mg, 50 mg, Oral, BID WC, Zola Button, MD, 50 mg at 02/25/21 0834 .  melatonin tablet 10 mg, 10 mg, Oral, QHS, Zola Button, MD, 10 mg at 02/24/21 2213 .  pantoprazole (PROTONIX) EC tablet 40 mg, 40 mg, Oral, Daily, Zola Button, MD, 40 mg at 02/25/21 0834 .  polyethylene glycol  (MIRALAX / GLYCOLAX) packet 17 g, 17 g, Oral, Daily, Ezequiel Essex, MD, 17 g at 02/25/21 1004 .  polyvinyl alcohol (LIQUIFILM TEARS) 1.4 % ophthalmic solution 1 drop, 1 drop, Both Eyes, Q8H PRN, Zola Button, MD .  sodium chloride flush (NS) 0.9 % injection 3 mL, 3 mL, Intravenous, Q12H, Ezequiel Essex, MD, 3 mL at 02/25/21 1004 .  sodium chloride flush (NS) 0.9 % injection 3 mL, 3 mL, Intravenous, PRN, Ezequiel Essex, MD .  traZODone (DESYREL) tablet 100 mg, 100 mg, Oral, QHS, Zola Button, MD, 100 mg at 02/24/21 2212:  . amLODipine  10 mg Oral q morning  . aspirin  81 mg Oral q morning  . atorvastatin  40 mg Oral QHS  . carvedilol  6.25 mg Oral BID  . cholecalciferol  5,000 Units Oral q morning  . enoxaparin (LOVENOX) injection  30 mg Subcutaneous Q24H  . fludrocortisone  0.1 mg Oral Daily  . hydrALAZINE  25 mg Oral BID WC  . isosorbide mononitrate  60 mg Oral q morning  . losartan  50 mg Oral BID WC  . melatonin  10 mg Oral QHS  . pantoprazole  40 mg Oral Daily  . polyethylene glycol  17 g Oral Daily  . sodium chloride flush  3 mL Intravenous Q12H  . traZODone  100 mg Oral QHS  :  Allergies  Allergen Reactions  . Ace Inhibitors Swelling  :  FH:  SOCIAL HISTORY:  Review of Systems:  Positives include:  A complete ROS was otherwise negative.   Physical Exam:  Blood pressure (!) 134/46, pulse 63, temperature 98.5 F (36.9 C), temperature source Oral, resp. rate 16, height 5\' 7"  (1.702 m), weight 148 lb 9.4 oz (67.4 kg), SpO2 98 %.  HEENT: No thrush Lungs: Clear anteriorly, no respiratory distress Cardiac: Regular rate and rhythm Abdomen: No hepatosplenomegaly, no mass, nontender, left lower quadrant colostomy Vascular: No leg edema Lymph nodes: No cervical, supraclavicular, axillary, or inguinal nodes Neurologic: Alert, partial expressive aphasia, oriented.  Moves all extremities to command Skin: Perineal scar without evidence of recurrent  tumor  LABS:  Recent Labs    02/24/21 1118 02/24/21 1124 02/25/21 0327  WBC 8.6  --  5.2  HGB 12.5 13.6 10.4*  HCT 41.6 40.0 33.5*  PLT 273  --  214    Recent Labs    02/24/21 1118 02/24/21 1124 02/25/21 0327  NA 141 141 138  K 4.0 3.9 3.5  CL 103 105 103  CO2 27  --  30  GLUCOSE 43* 44* 94  BUN 22 24* 16  CREATININE 1.76* 1.80* 1.79*  CALCIUM 9.5  --  8.7*      RADIOLOGY:  DG Chest 2 View  Result Date:  02/24/2021 CLINICAL DATA:  Cough. EXAM: CHEST - 2 VIEW COMPARISON:  November 29, 2017. FINDINGS: The heart size is within normal limits. Stable enlarged right paratracheal abnormality is noted consistent with thyroid nodule. Both lungs are clear. The visualized skeletal structures are unremarkable. IMPRESSION: No active cardiopulmonary disease. Aortic Atherosclerosis (ICD10-I70.0). Electronically Signed   By: Marijo Conception M.D.   On: 02/24/2021 12:58   MR BRAIN WO CONTRAST  Result Date: 02/24/2021 CLINICAL DATA:  Neuro deficit, acute stroke suspected. EXAM: MRI HEAD WITHOUT CONTRAST TECHNIQUE: Multiplanar, multiecho pulse sequences of the brain and surrounding structures were obtained without intravenous contrast. COMPARISON:  Same day CT head.  MRI December 18, 2009. FINDINGS: Brain: No acute infarction, acute hemorrhage, hydrocephalus, extra-axial collection or mass lesion. Confluent remote infarct in the inferior left cerebellum. Additional lacunar infarcts involving bilateral corona radiata, basal ganglia, left thalamus, corpus callosum right cerebellum and pons. Advanced T2/FLAIR hyperintensities within the white matter, compatible with chronic microvascular ischemic disease. Generalized cerebral atrophy with ex vacuo ventricular dilation. Vascular: Major arterial flow voids maintained at the skull base. Skull and upper cervical spine: Normal marrow signal. Sinuses/Orbits: Clear sinuses.  Unremarkable orbits. Other: No sizable mastoid effusion. IMPRESSION: 1. No evidence  of acute intracranial abnormality. Specifically, no acute infarct. 2. Advanced chronic microvascular ischemic disease and multiple prior infarcts, as detailed above. Electronically Signed   By: Margaretha Sheffield MD   On: 02/24/2021 17:23   US RENAL  Result Date: 02/25/2021 CLINICAL DATA:  History of likely RCC on MRI in 2018. Unable to receive contrast. EXAM: RENAL / URINARY TRACT ULTRASOUND COMPLETE COMPARISON:  CT 01/18/2020, MR 10/21/2017 FINDINGS: Right Kidney: Renal measurements: 9.7 x 3 x 4.5 cm = volume: 67.2 mL. Increased renal cortical echogenicity. Shadowing upper pole 5 mm nonobstructing calculus. No hydronephrosis. No concerning renal mass. Left Kidney: Renal measurements: 12 x 5.8 x 4.7 cm = volume: 72.3 mL. Suboptimal visualization due to bowel gas rib shadowing. Increased renal cortical echogenicity. Redemonstration the solid mass lesion seen in the upper left kidney measuring now 4.8 x 5.3 x 5.4 cm, similar size to comparison CT imaging in 2021. Additional partially exophytic fluid attenuation cyst measuring 2.7 x 2.4 x 2.5 cm. No new concerning renal mass. The renal vein appears grossly patent though incompletely visualized. Bladder: Largely decompressed at the time of exam despite allowing 90 minutes of filling time. No gross bladder abnormality accounting for underdistention. Other: None. IMPRESSION: 1. 5.4 cm solid mass lesion in the upper left kidney compatible with a renal cell carcinoma, similar to comparison CT accounting for differences in imaging technique. 2. Additional 2.7 cm left renal cyst. 3. Nonobstructing 5 mm right renal calculus. No hydronephrosis. 4. Increased cortical echogenicity compatible with medical renal disease. 5. Decompressed urinary bladder despite 90 minutes of filling time. Correlate for diminished renal function. No gross bladder abnormality is seen. Electronically Signed   By: Lovena Le M.D.   On: 02/25/2021 02:19   CT HEAD CODE STROKE WO CONTRAST  Result  Date: 02/24/2021 CLINICAL DATA:  Code stroke.  Neuro deficit, acute stroke suspected. EXAM: CT HEAD WITHOUT CONTRAST TECHNIQUE: Contiguous axial images were obtained from the base of the skull through the vertex without intravenous contrast. COMPARISON:  11/29/2017. FINDINGS: Brain: No evidence of acute large vascular territory infarct or acute hemorrhage. Remote lacunar infarcts involving bilateral basal ganglia and corona radiata, right thalamus, and left pons. More confluent/larger remote infarct involving the inferior left cerebellum. No hydrocephalus. No mass lesion or abnormal mass  effect. No visible extra-axial fluid collection. Vascular: No hyperdense vessel identified. Calcific atherosclerosis. Skull: No acute fracture. Sinuses/Orbits: Visualized sinuses are clear.  Unremarkable orbits. Other: No mastoid effusions. ASPECTS [facility] Stroke Program Early CT Score) total score (0-10 with 10 being normal): 10. IMPRESSION: 1. No evidence of acute large vascular territory infarct or acute hemorrhage. ASPECTS is 10. 2. Advanced chronic microvascular ischemic disease with multiple old infarcts, as detailed above. An acute white matter infarct could be easily obscured by the patient's white matter disease and MRI could better evaluate for acute infarct if clinically indicated. Findings discussed with Dr. Curly Shores at 11:39 AM via telephone. Electronically Signed   By: Margaretha Sheffield MD   On: 02/24/2021 11:38   US Abdomen Limited RUQ (LIVER/GB)  Result Date: 02/24/2021 CLINICAL DATA:  Hypoglycemia and right upper quadrant pain EXAM: ULTRASOUND ABDOMEN LIMITED RIGHT UPPER QUADRANT COMPARISON:  None. FINDINGS: Gallbladder: No gallstones or wall thickening visualized. No sonographic Murphy sign noted by sonographer. Common bile duct: Diameter: 4.3 mm. Liver: No focal lesion identified. Within normal limits in parenchymal echogenicity. Portal vein is patent on color Doppler imaging with normal direction of blood flow  towards the liver. Other: Note is made of increased echogenicity within the right kidney consistent with the given clinical history of chronic renal disease. Mild fullness of the right renal collecting system is noted. IMPRESSION: Increased echogenicity within the right kidney consistent with the given clinical history. Mild hydronephrosis is noted. No acute abnormality in the right upper quadrant is noted. Electronically Signed   By: Inez Catalina M.D.   On: 02/24/2021 18:37    Assessment and Plan:   1.  Admission with symptomatic hypoglycemia, etiology unclear, improved with a dextrose infusion 2.  Left renal mass dating to at least 2016, enlarged on an MRI abdomen 2018 3.  History of CVA 4.  CAD 5.  COPD 6.  Stage I rectal cancer 2011, status post an APR, colostomy 7.  Chronic renal failure 8.  Adrenal insufficiency 9.  Anemia  Sheila Flynn was admitted with hypoglycemia.  The hypoglycemia could be related to adrenal insufficiency or another etiology.  There are rare reports of hypoglycemia related to renal cell carcinoma.  Renal cell carcinoma can secrete an insulin like growth factor causing hypoglycemia.  Resection of the renal cell carcinoma would be expected to improve the hypoglycemia in the setting.  She should undergo repeat imaging to assess the left renal mass and adrenal glands.  An MRI abdomen will be the most sensitive study to assess the kidneys and to look for a pancreatic neoplasm.  She can have a noncontrast CT abdomen/pelvis and chest if her renal function does not improve.  Resection of the renal mass should be considered if there is no evidence of metastatic disease.  However,  she may not be a surgical candidate due to multiple comorbid conditions. Recommendations: 1.  Continue management of hypoglycemia per the medical service, consider an endocrinology evaluation 2.  Image the abdomen to assess the known left renal mass and look for evidence of a pancreatic neoplasm 3.   Noncontrast CT of the chest to rule out metastatic disease 4.  Urology consult to consider resection of the renal mass if there is no evidence of metastatic disease  I will continue following her in the hospital and outpatient follow-up will be scheduled at the Cancer center as needed.    Betsy Coder, MD 02/25/2021, 3:23 PM

## 2021-02-25 NOTE — Progress Notes (Addendum)
The patient denies any concern today.   No acute findings on her physical exam.  A/P: Severe symptomatic hypoglycemia: ?? Due to insulinoma/Islet cell tumor vs. iatrogenic - insulin injection (less likely) Improved on D5NS F/U C-peptide, serum sulphonylurea Beta-hydroxybutyrate is normal. Renal and Abd [place] reviewed. We will obtain an abdominal MRI to assess her pancreas for an islet cell tumor. Monitor closely for improvement. Continue CBG check. Consult endocrinology.  Adrenal insufficiency: Likely part of the culprit here Hx of chronic steroid use. Unclear how recent. AM cortisol is low. Start Fludrocortisol daily MRI abdomen to evaluate for adrenal mass. Monitor closely  Acute, chronic kidney disease stage IIIb : IVF Avoid nephrotoxic agents Monitor closely.  Renal Cell Cancer: Consult oncology

## 2021-02-25 NOTE — Progress Notes (Signed)
Family Medicine Teaching Service Daily Progress Note Intern Pager: [phone]  Patient name: Sheila Flynn Medical record number: [id number] Date of birth: 1946/06/29 Age: 75 y.o. Gender: female  Primary Care Provider: Patient, No Pcp Per (Inactive) Consultants: Neuro (s/o), oncology Code Status: Full  Pt Overview and Major Events to Date:  4/18 admitted  Assessment and Plan: Sheila Flynn is a 75 y.o. female presenting via EMS with right sided weakness and facial drooping, found to be persistently hypoglycemic. PMH is significant for diastolic CHF, stroke, CAD, COPD, T2DM, GERD, rectal ca s/p colostomy, vascular dementia, depression.   Right sided weakness, right sided facial droop  Hypoglycemia  Hx stroke Patient doing well this morning, has no complaints.  Reports feeling fine overnight.  Eating well.  No nausea, vomiting, abdominal pain.  MRI brain yesterday negative for acute abnormality or ischemia, did show chronic vascular changes apparent on CT.  Renal ultrasound today demonstrates 5.4 cm solid mass lesion in upper left kidney (likely renal cell carcinoma, similar to previous CT), additional 2.7 cm left renal cyst, nonobstructing 5 mm right renal calculus, increased cortical echogenicity bilaterally.  Patient did fairly well overnight on D5 NS with glucoses 70s-90s.  Morning cortisol low at 3.7.  Other morning labs normal: Magnesium, phosphorus, TSH (0.438), ethanol, UDS, A1c (6.0).  CBC and CMP unchanged from yesterday with the exception of hemoglobin 10.4 today. - Oncology consulted (history of rectal and renal cell carcinoma), appreciate recommendations - Awaiting sulfonylurea panel, random insulin, BHB, C-peptide labs - A.m. CBC CMP -IV fluids D5 NS - Vitals.  Routine - PT/OT eval and treat  Elevated Creatinine  Glucosuria Creatinine today 1.79 stable from admission 1.8.  Baseline per chart review 1.3-1.45. No CKD diagnosis in chart. Likely CKD due to longstanding  T2DM. Renal ultrasound today demonstrates 5.4 cm solid mass lesion in upper left kidney (likely renal cell carcinoma, similar to previous CT), additional 2.7 cm left renal cyst, nonobstructing 5 mm right renal calculus, increased cortical echogenicity bilaterally.  - AM BMP  Hypertension Intermittently hypertensive overnight this morning.  Systolics ranged from 956O- 180s.  Currently on home antihypertensives.  No changes at this time due to intermittent hypotensive measurements such as 110/42, 115/43.  CHF  CAD  HLD Home meds include atorvastatin 40 mg qHS, isosorbide 60 mg qAM, nitroglycerin sublingual PRN.  -Continue home meds  GERD Home meds of omeprazole 20 mg daily, zofran 4 mg PRN.  -Continue home meds  Vascular dementia  Depression  Insomnia At home takes trazodone 100 mg qHS, melatonin 10 mg qHS.  -Continue home meds  Rectal cancer s/p colostomy Normal colostomy care.   COPD Listed in chart, but no home meds for this. Currently SpO2 normal on room air.   T2DM Listed in chart, but no home meds for this.  Chronic constipation Continue Home med of senokot qAM.   Low vitamin D Continue Home med of cholecalciferol 5,000 units qAM.   Dry Eyes Artificial tears PRN.   Prolonged QT: QTC this morning to 495, unchanged from admission. -Avoid QT prolonging agents  FEN/GI: Heart healthy PPx: Lovenox   Status is: Inpatient  Remains inpatient appropriate because:Ongoing diagnostic testing needed not appropriate for outpatient work up and Inpatient level of care appropriate due to severity of illness   Dispo: The patient is from: SNF              Anticipated d/c is to: SNF  Patient currently is not medically stable to d/c.   Difficult to place patient No    Subjective:  Patient laying in bed with breakfast in front of her.  Reports no pain or discomfort overnight.  No complaints.  Eating breakfast well.  No nausea, vomiting, diarrhea,  abdominal pain.  Objective: Temp:  [98.1 F (36.7 C)-99.2 F (37.3 C)] 98.5 F (36.9 C) (04/19 0801) Pulse Rate:  [61-93] 63 (04/19 1131) Resp:  [12-24] 16 (04/19 1131) BP: (110-184)/(42-107) 134/46 (04/19 1131) SpO2:  [93 %-99 %] 98 % (04/19 1131) Weight:  [67.4 kg] 67.4 kg (04/18 2119) Physical Exam: General: Awake, alert, no acute distress Cardiovascular: RRR, no murmur Respiratory: CTA B Abdomen: Soft, nondistended  Laboratory: Recent Labs  Lab 02/24/21 1118 02/24/21 1124 02/25/21 0327  WBC 8.6  --  5.2  HGB 12.5 13.6 10.4*  HCT 41.6 40.0 33.5*  PLT 273  --  214   Recent Labs  Lab 02/24/21 1118 02/24/21 1124 02/25/21 0327  NA 141 141 138  K 4.0 3.9 3.5  CL 103 105 103  CO2 27  --  30  BUN 22 24* 16  CREATININE 1.76* 1.80* 1.79*  CALCIUM 9.5  --  8.7*  PROT 7.9  --  5.9*  BILITOT 0.4  --  0.6  ALKPHOS 61  --  47  ALT 19  --  14  AST 24  --  19  GLUCOSE 43* 44* 94    Imaging/Diagnostic Tests: ULTRASOUND ABDOMEN LIMITED RIGHT UPPER QUADRANT 02/24/2021 IMPRESSION: Increased echogenicity within the right kidney consistent with the given clinical history. Mild hydronephrosis is noted. No acute abnormality in the right upper quadrant is noted.  RENAL / URINARY TRACT ULTRASOUND COMPLETE 02/25/2021 IMPRESSION: 1. 5.4 cm solid mass lesion in the upper left kidney compatible with a renal cell carcinoma, similar to comparison CT accounting for differences in imaging technique. 2. Additional 2.7 cm left renal cyst. 3. Nonobstructing 5 mm right renal calculus. No hydronephrosis. 4. Increased cortical echogenicity compatible with medical renal disease. 5. Decompressed urinary bladder despite 90 minutes of filling time. Correlate for diminished renal function. No gross bladder abnormality is seen.   Ezequiel Essex, MD 02/25/2021, 2:46 PM PGY-1, [place] Intern pager: [phone], text pages welcome

## 2021-02-25 NOTE — Progress Notes (Signed)
SLP Cancellation Note  Patient Details Name: Sheila Flynn MRN: [id number] DOB: 01/08/1946   Cancelled treatment:       Reason Eval/Treat Not Completed: SLP screened. Pt passed bedside swallow evaluation per MD note (02/24/21). RN reports pt doing well with current diet and med intake. No needs identified, will sign off.   [name], [place], [place] [facility] Office Number: [phone]  Acie Fredrickson 02/25/2021, 12:39 PM

## 2021-02-25 NOTE — Progress Notes (Signed)
Pt arrived to unit 2w29 at 2100. Report received from [facility]. Pt A&O x 4, has IV x 1 with D5 gtt at 50 ml/hr. Pt oriented to room and unit. Call bell given to pt and bed alarm turned on. Provider notified of pt's arrival.

## 2021-02-26 DIAGNOSIS — E1149 Type 2 diabetes mellitus with other diabetic neurological complication: Secondary | ICD-10-CM | POA: Diagnosis not present

## 2021-02-26 DIAGNOSIS — C649 Malignant neoplasm of unspecified kidney, except renal pelvis: Secondary | ICD-10-CM

## 2021-02-26 DIAGNOSIS — E162 Hypoglycemia, unspecified: Secondary | ICD-10-CM | POA: Diagnosis not present

## 2021-02-26 LAB — GLUCOSE, CAPILLARY
Glucose-Capillary: 91 mg/dL (ref 70–99)
Glucose-Capillary: 90 mg/dL (ref 70–99)
Glucose-Capillary: 136 mg/dL — ABNORMAL HIGH (ref 70–99)
Glucose-Capillary: 125 mg/dL — ABNORMAL HIGH (ref 70–99)
Glucose-Capillary: 162 mg/dL — ABNORMAL HIGH (ref 70–99)
Glucose-Capillary: 105 mg/dL — ABNORMAL HIGH (ref 70–99)
Glucose-Capillary: 108 mg/dL — ABNORMAL HIGH (ref 70–99)
Glucose-Capillary: 105 mg/dL — ABNORMAL HIGH (ref 70–99)
Glucose-Capillary: 77 mg/dL (ref 70–99)
Glucose-Capillary: 107 mg/dL — ABNORMAL HIGH (ref 70–99)
Glucose-Capillary: 124 mg/dL — ABNORMAL HIGH (ref 70–99)
Glucose-Capillary: 137 mg/dL — ABNORMAL HIGH (ref 70–99)
Glucose-Capillary: 96 mg/dL (ref 70–99)

## 2021-02-26 LAB — URINALYSIS, ROUTINE W REFLEX MICROSCOPIC
Bilirubin Urine: NEGATIVE
Glucose, UA: NEGATIVE mg/dL
Hgb urine dipstick: NEGATIVE
Ketones, ur: NEGATIVE mg/dL
Leukocytes,Ua: NEGATIVE
Nitrite: NEGATIVE
Protein, ur: NEGATIVE mg/dL
Specific Gravity, Urine: 1.009 (ref 1.005–1.030)
pH: 5 (ref 5.0–8.0)

## 2021-02-26 LAB — INSULIN, RANDOM: Insulin: 13.8 u[IU]/mL (ref 2.6–24.9)

## 2021-02-26 LAB — BASIC METABOLIC PANEL
Anion gap: 7 (ref 5–15)
BUN: 19 mg/dL (ref 8–23)
CO2: 26 mmol/L (ref 22–32)
Calcium: 8.6 mg/dL — ABNORMAL LOW (ref 8.9–10.3)
Chloride: 107 mmol/L (ref 98–111)
Creatinine, Ser: 1.98 mg/dL — ABNORMAL HIGH (ref 0.44–1.00)
GFR, Estimated: 26 mL/min — ABNORMAL LOW (ref >60)
Glucose, Bld: 93 mg/dL (ref 70–99)
Potassium: 3.9 mmol/L (ref 3.5–5.1)
Sodium: 140 mmol/L (ref 135–145)

## 2021-02-26 LAB — CBC
HCT: 31.6 % — ABNORMAL LOW (ref 36.0–46.0)
Hemoglobin: 9.5 g/dL — ABNORMAL LOW (ref 12.0–15.0)
MCH: 25.9 pg — ABNORMAL LOW (ref 26.0–34.0)
MCHC: 30.1 g/dL (ref 30.0–36.0)
MCV: 86.1 fL (ref 80.0–100.0)
Platelets: 204 10*3/uL (ref 150–400)
RBC: 3.67 MIL/uL — ABNORMAL LOW (ref 3.87–5.11)
RDW: 15.6 % — ABNORMAL HIGH (ref 11.5–15.5)
WBC: 4.8 10*3/uL (ref 4.0–10.5)
nRBC: 0 % (ref 0.0–0.2)

## 2021-02-26 LAB — C-PEPTIDE: C-Peptide: 5.9 ng/mL — ABNORMAL HIGH (ref 1.1–4.4)

## 2021-02-26 LAB — LIPASE, BLOOD: Lipase: 34 U/L (ref 11–51)

## 2021-02-26 LAB — AMYLASE: Amylase: 127 U/L — ABNORMAL HIGH (ref 28–100)

## 2021-02-26 MED ORDER — SODIUM CHLORIDE 0.9 % IV SOLN: INTRAVENOUS | Status: DC

## 2021-02-26 MED ORDER — ISOSORBIDE MONONITRATE 20 MG PO TABS
20.0000 mg | ORAL_TABLET | Freq: Two times a day (BID) | ORAL | Status: DC
  Administered 2021-02-27 – 2021-03-01 (×5): 20 mg via ORAL
  Filled 2021-02-26 (×6): qty 1

## 2021-02-26 NOTE — Progress Notes (Signed)
Family Medicine Teaching Service Daily Progress Note Intern Pager: [phone]  Patient name: Sheila Flynn Medical record number: [id number] Date of birth: January 16, 1946 Age: 75 y.o. Gender: female  Primary Care Provider: Patient, No Pcp Per (Inactive) Consultants: Onc Code Status: Full  Pt Overview and Major Events to Date:  4/18 admitted  Assessment and Plan: Bellatrix G Johnsonis a 75 y.o.femalepresenting via EMS with right sided weakness and facial drooping, found to be persistently hypoglycemic. PMH is significant fordiastolic CHF, stroke, CAD, COPD, T2DM, GERD, rectal ca s/p colostomy, vascular dementia, depression.  Hypoglycemia  T2DM Patient reports feeling well, no complaints. Tolerating PO intake without difficulty. Glucose overnight 115-158 with AM CBGs 91, 93 on 25 mL/hr D5NS. Will trial off D5 and on NS only today with CBGs q1 hr. Amylase slightly elevated at 127, lipase normal at 34. C-peptide slightly elevated at 5.9, random insulin. Sulfonurea panel and pancreatic fecal elastase in process at this time.  - Onc consulted, appreciate recommendations - Awaiting sulfonylurea panel, fecal elastase - AM CMP - mIVF w/ NS @100mL /hr - vitals per unit routine - PT/OT eval and treat  Elevated Creatinine Glucosuria Creatinine this morning 1.98, increased from 1.79 yesterday. Currently on low fluid rate with full diet on order.  - AM BMP - increased mIVF to 100 mL/hr - hold losartan  Hypertension Intermittently hypertensive with systolics in the 993Z-169C. On home antihypertensives. No increases to meds at this time due to persistent borderline low diastolics in 78L-38B.  - continue to monitor  CHF  CAD HLD Home meds include atorvastatin 40 mg qHS, isosorbide 60 mg qAM, nitroglycerin sublingual PRN.  -Continue home meds - Adjusted isosorbide to 20 BID  GERD Home meds of omeprazole 20 mg daily, zofran 4 mg PRN.  -Continue home meds  Vascular dementia   Depression Insomnia At home takes trazodone 100 mg qHS, melatonin 10 mg qHS.  -Continue home meds  Rectal cancer s/p colostomy Normal colostomy care.  COPD Listed in chart, but no home meds for this. Currently SpO2normal on room air.  Chronic constipation Continue Home med of senokot qAM.   Low vitamin D Continue Home med of cholecalciferol 5,000 units qAM.    FEN/GI: heart health PPx: lovenox   Status is: Inpatient  Remains inpatient appropriate because:Ongoing diagnostic testing needed not appropriate for outpatient work up and Inpatient level of care appropriate due to severity of illness   Dispo: The patient is from: SNF              Anticipated d/c is to: SNF              Patient currently is not medically stable to d/c.   Difficult to place patient No   Subjective:  Patient reports feeling well, no complaints. Tolerating PO intake without difficulty. Amenable to plan today.   Objective: Temp:  [98 F (36.7 C)-98.5 F (36.9 C)] 98 F (36.7 C) (04/20 0310) Pulse Rate:  [61-75] 64 (04/20 0310) Resp:  [13-18] 16 (04/20 0310) BP: (127-168)/(46-61) 131/61 (04/20 0310) SpO2:  [96 %-98 %] 96 % (04/20 0310) Physical Exam: General: awake, alert, slow to respond  Cardiovascular: RRR, no murmur Respiratory: CTAB Extremities: moving all spontaneously, no focal defecits  Laboratory: Recent Labs  Lab 02/24/21 1118 02/24/21 1124 02/25/21 0327 02/26/21 0322  WBC 8.6  --  5.2 4.8  HGB 12.5 13.6 10.4* 9.5*  HCT 41.6 40.0 33.5* 31.6*  PLT 273  --  214 204   Recent Labs  Lab 02/24/21  1118 02/24/21 1124 02/25/21 0327 02/26/21 0322  NA 141 141 138 140  K 4.0 3.9 3.5 3.9  CL 103 105 103 107  CO2 27  --  30 26  BUN 22 24* 16 19  CREATININE 1.76* 1.80* 1.79* 1.98*  CALCIUM 9.5  --  8.7* 8.6*  PROT 7.9  --  5.9*  --   BILITOT 0.4  --  0.6  --   ALKPHOS 61  --  47  --   ALT 19  --  14  --   AST 24  --  19  --   GLUCOSE 43* 44* 94 93     Imaging/Diagnostic Tests: None last 24 hours.   Ezequiel Essex, MD 02/26/2021, 7:46 AM PGY-1, [place] Intern pager: [phone], text pages welcome

## 2021-02-26 NOTE — Progress Notes (Signed)
FPTS Interim Progress Note  S: Went to bedside to evaluate patient after reports of choking on food.  Per RN report, she had coughed up copious amounts of saliva and food.  Patient was suctioned and the head of bed was raised as high as possible. Patient states that she had choked while eating dinner.  Reports feeling okay at this time.  No other concerns expressed at this time.  O: BP (!) 156/57   Pulse 64   Temp 98.3 F (36.8 C) (Oral)   Resp 13   Ht 5\' 7"  (1.702 m)   Wt 67.4 kg   SpO2 97%   BMI 23.27 kg/m   General: Sitting up in bed resting comfortably, NAD CV: RRR, no murmurs Respiratory: Clear to auscultation bilaterally  A/P: Patient with episode of choking after eating dinner, no acute distress at this time.  Patient was previously on heart healthy diet, now changed to dysphagia 1 diet after choking event.  Agree with diet change, will continue to monitor.  Zola Button, MD 02/26/2021, 7:56 PM PGY-1, [place] Medicine Service pager [phone]

## 2021-02-26 NOTE — Hospital Course (Addendum)
Sheila Flynn is a 75 year old female who presented via EMS as a code stroke for right-sided weakness and facial drooping, found to be persistently hypoglycemic.  Symptoms improved with euvolemia. PMH is significant for diastolic CHF, stroke, CAD, COPD, T2DM, GERD, rectal ca s/p colostomy, vascular dementia, depression  Right sided weakness, right sided facial droop  Hypoglycemia  Hx stroke Patient presents to ED via EMS for unresponsiveness. Was reportedly at her neurologic baseline at her nursing facility morning of 4/18 when she suddenly became less responsive and started drooling. EMS incoded with CODE STROKE after examining patient and finding right-sided facial drooping and weakness. They also found her to have glucose of 42, administered dextrose, after which her unilateral symptoms improved.  However, the patient's glucose did continue to drop back into the 50s-70s throughout the day after this.  CT head on presentation was negative for acute ischemic process, but did demonstrate old infarcts and chronic vascular disease. Stroke team declined tPA, felt that her symptoms more likely due to hypoglycemia. ED provider found that despite initial improvement in symptoms and serum glucose after EMS dextrose administration, patient's CBG decreased again to the 50s; she was started on D5NS drip to maintain euglycemia. CXR unremarkable. CBC with differential and CMP unremarkable aside from glucose of 44 and creatinine 1.80. UA showed 50 glucose, 100 protein, rare bacteria. UDS negative. RUQ ultrasound negative for hepatic structural abnormality. Respiratory panel negative. Patient A&Ox3 on admission, denies taking insulin, glyburides, or other medications for her type 2 diabetes.  Right upper quadrant ultrasound negative for structural hepatic abnormality.  Renal ultrasound demonstrated increased cortical echogenicity compatible with medical renal disease, nonobstructing 5 mm right renal calculus, 5.4 similar solid  mass lesion of upper left kidney likely renal cell carcinoma (similar to previous CT in 2019), additional 2.7 cm left renal cyst.  Amylase slightly high at 127, lipase normal at 34.  Pancreatic stool elastase slightly decreased at 169.  A.m. cortisol low at 3.7 (although collected at 3 AM).  Random insulin 13.8, C-peptide 5.9, BHB 0.12.  TSH low normal.  Serum ethanol negative.  Sulfonylurea hyperglycemia panel still active and in process.  Patient thought to be experiencing true insufficiency, was trialed on Cortef 3 times daily with marked improvement of glucose measurements off of glucose containing IV fluids.  Patient discharged on Cortef 3 times daily (10, 5, 5) with plan for close PCP follow-up.

## 2021-02-26 NOTE — Progress Notes (Signed)
Patient appears to have choked on food. Copious amounts of salvia and food coughed up, suction set up at bedside and patient suctioned, head of bed raised as high as possible,  sp02 98 % RA, HR 102 BP 156/57 Map 90 RR 20 Paged Dr Jeani Hawking with family medicine and made aware. Placing patient on a pureed diet for now.

## 2021-02-26 NOTE — Progress Notes (Signed)
Initial Nutrition Assessment  DOCUMENTATION CODES:  Non-severe (moderate) malnutrition in context of chronic illness  INTERVENTION:   Recommend that pt be placed on a Regular diet if/when able to be advanced to optimize PO intake.   Boost Breeze po TID, each supplement provides 250 kcal and 9 grams of protein   27ml Prosource Plus po BID, each supplement provides 100 kcals and 15 grams of protein  Magic cup TID with meals, each supplement provides 290 kcal and 9 grams of protein  MVI with minerals daily  NUTRITION DIAGNOSIS:  Moderate Malnutrition related to chronic illness (dementia, ? cancer) as evidenced by mild fat depletion,mild muscle depletion,moderate muscle depletion.  GOAL:  Patient will meet greater than or equal to 90% of their needs  MONITOR:  PO intake,Supplement acceptance,Diet advancement,Skin,Weight trends,Labs,I & O's  REASON FOR ASSESSMENT:  Consult Assessment of nutrition requirement/status  ASSESSMENT:  Pt presenting via EMS with right sided weakness and facial drooping, found to be persistently hypoglycemic. PMH is significant for diastolic CHF, stroke, CAD, COPD, T2DM, GERD, rectal ca s/p colostomy, vascular dementia, depression.  4/18 diet advanced to heart healthy 4/20 diet downgraded to dysphagia 1 with thin liquids due to coughing/choking episode 4/21 diet advanced to soft with thin liquids s/p BSE  RD consulted to assess pt's nutritional needs/status and due to pt experiencing hypoglycemia of unknown cause. Pt appears very lethargic upon examination. Difficult to elicit detailed responses from pt throughout interview; pt mostly responding with head nods/shakes and one-word answers. When asked open-ended questions, pt often provided no response. Pt denied skipping meals throughout the day and denied having skipped a meal on the day of admission. Pt also denied any changes to daily routine, new medications, sleep, stress, etc; and denied having felt any  differently than usual the morning of the event. Pt reported no previous episodes of hypoglycemia and no episodes since. Pt then stated appetite is good, but that she's not eating more than ~25% of meals due to not liking meal options. Pt stated that she eats 3 balanced meals per day at baseline that are of similar portions to what she has received in the hospital and denied any recent changes to her appetite. Please note this is contradictory to what pt has reported to other providers. After further chart review, it should be noted that pt has endorsed poor appetite and multiple episodes of hypoglycemia in addition to having skipped breakfast on the morning of admission.   Per H&P, MD called pt's SNF to confirm that neither the pt nor her neighboring residents are on type 2 DM medications and to also confirm that pt did not have any visitors/changes in day-to-day activity prior to symptom onset. Note that pt was pending hospice admission in May 2019 due to chronic conditions and poor PO intake with weight loss and functional decline; additionally, there was concern for potential renal cell carcinoma.   Pt currently being treated for adrenal insufficiency as a potential cause of her hypoglycemic episodes. Oncology is following pt and recommending CT chest to look for metastatic lung disease to help determine prognosis and pt's surgical candidacy should it be thought that her hypoglycemic episodes are coming from renal cell carcinoma as opposed to adrenal insuffiency.  Difficult to determine how much pt's intake/potential lack of intake are contributing to her hypoglycemia given varied reports of PO intake and lack of documentation. Per Resident, pt is w/o complaints today -- denies N/V/abdominal pain and is eating well, though no PO intake documented by  nursing staff. If pt is truly eating well, it is unlikely that lack of intake is primary cause of hypoglycemia, though this cannot be ruled out as at least a  partial contributor given some reports of poor intake. Will provide pt with oral nutrition supplements in hopes of increasing intake. Pt informed this RD that she dislikes Ensure, but is agreeable to [employer]. Will also trial Boost Breeze for pt, though this may cause too significant of an increase in blood sugar.   Recommend that pt be placed on a Regular diet if/when able to be advanced to optimize PO intake.  Most recent weight to compare current weight to is from January 2019, making it difficult to determine if pt has experienced any significant weight loss. Note that pt denied weight loss when interviewed by RD.   UOP: 1137ml x24 hours  Non-pitting edema noted to BUE per RN assessment  Medications: vitamin D3, miralax, protonix Labs: Cr 1.75 (H, lower than yesterday) CBGs 100-116-120  NUTRITION - FOCUSED PHYSICAL EXAM: Flowsheet Row Most Recent Value  Orbital Region No depletion  Upper Arm Region Mild depletion  Thoracic and Lumbar Region No depletion  Buccal Region Mild depletion  Temple Region Mild depletion  Clavicle Bone Region Mild depletion  Clavicle and Acromion Bone Region No depletion  Scapular Bone Region No depletion  Dorsal Hand No depletion  Patellar Region Mild depletion  Anterior Thigh Region Moderate depletion  Posterior Calf Region Moderate depletion  Edema (RD Assessment) None  Hair Reviewed  Eyes Reviewed  Mouth Reviewed  Skin Reviewed  Nails Reviewed     Diet Order:   Diet Order            DIET SOFT Room service appropriate? No; Fluid consistency: Thin  Diet effective now                EDUCATION NEEDS:  Not appropriate for education at this time  Skin:  Skin Assessment: Reviewed RN Assessment  Last BM:  4/21 type 6 via colostomy  Height:  Ht Readings from Last 1 Encounters:  02/24/21 5\' 7"  (1.702 m)   Weight:  Wt Readings from Last 1 Encounters:  02/24/21 67.4 kg   BMI:  Body mass index is 23.27 kg/m.  Estimated Nutritional  Needs:  Kcal:  1800-2000 Protein:  90-100grams Fluid:  >1.8L/d    Larkin Ina, MS, RD, LDN RD pager number and weekend/on-call pager number located in [place].

## 2021-02-27 DIAGNOSIS — R531 Weakness: Secondary | ICD-10-CM | POA: Diagnosis not present

## 2021-02-27 DIAGNOSIS — R4182 Altered mental status, unspecified: Secondary | ICD-10-CM | POA: Diagnosis not present

## 2021-02-27 DIAGNOSIS — E162 Hypoglycemia, unspecified: Secondary | ICD-10-CM | POA: Diagnosis not present

## 2021-02-27 DIAGNOSIS — I5032 Chronic diastolic (congestive) heart failure: Secondary | ICD-10-CM | POA: Diagnosis not present

## 2021-02-27 LAB — BASIC METABOLIC PANEL
Anion gap: 4 — ABNORMAL LOW (ref 5–15)
BUN: 18 mg/dL (ref 8–23)
CO2: 28 mmol/L (ref 22–32)
Calcium: 8.3 mg/dL — ABNORMAL LOW (ref 8.9–10.3)
Chloride: 108 mmol/L (ref 98–111)
Creatinine, Ser: 1.75 mg/dL — ABNORMAL HIGH (ref 0.44–1.00)
GFR, Estimated: 30 mL/min — ABNORMAL LOW (ref >60)
Glucose, Bld: 75 mg/dL (ref 70–99)
Potassium: 3.7 mmol/L (ref 3.5–5.1)
Sodium: 140 mmol/L (ref 135–145)

## 2021-02-27 LAB — HEMOGLOBIN AND HEMATOCRIT, BLOOD
HCT: 30 % — ABNORMAL LOW (ref 36.0–46.0)
Hemoglobin: 9.2 g/dL — ABNORMAL LOW (ref 12.0–15.0)

## 2021-02-27 LAB — GLUCOSE, CAPILLARY
Glucose-Capillary: 100 mg/dL — ABNORMAL HIGH (ref 70–99)
Glucose-Capillary: 114 mg/dL — ABNORMAL HIGH (ref 70–99)
Glucose-Capillary: 116 mg/dL — ABNORMAL HIGH (ref 70–99)
Glucose-Capillary: 116 mg/dL — ABNORMAL HIGH (ref 70–99)
Glucose-Capillary: 116 mg/dL — ABNORMAL HIGH (ref 70–99)
Glucose-Capillary: 120 mg/dL — ABNORMAL HIGH (ref 70–99)
Glucose-Capillary: 130 mg/dL — ABNORMAL HIGH (ref 70–99)
Glucose-Capillary: 137 mg/dL — ABNORMAL HIGH (ref 70–99)
Glucose-Capillary: 145 mg/dL — ABNORMAL HIGH (ref 70–99)
Glucose-Capillary: 153 mg/dL — ABNORMAL HIGH (ref 70–99)
Glucose-Capillary: 67 mg/dL — ABNORMAL LOW (ref 70–99)
Glucose-Capillary: 67 mg/dL — ABNORMAL LOW (ref 70–99)
Glucose-Capillary: 83 mg/dL (ref 70–99)
Glucose-Capillary: 84 mg/dL (ref 70–99)
Glucose-Capillary: 84 mg/dL (ref 70–99)
Glucose-Capillary: 85 mg/dL (ref 70–99)
Glucose-Capillary: 86 mg/dL (ref 70–99)

## 2021-02-27 MED ORDER — DEXTROSE 50 % IV SOLN
12.5000 g | INTRAVENOUS | Status: AC
  Administered 2021-02-27: 12.5 g via INTRAVENOUS

## 2021-02-27 MED ORDER — HYDROCORTISONE 10 MG PO TABS
10.0000 mg | ORAL_TABLET | Freq: Every day | ORAL | Status: DC
  Administered 2021-02-28 – 2021-03-01 (×2): 10 mg via ORAL
  Filled 2021-02-27 (×2): qty 1

## 2021-02-27 MED ORDER — HYDROCORTISONE 5 MG PO TABS
5.0000 mg | ORAL_TABLET | Freq: Every day | ORAL | Status: DC
  Administered 2021-02-27 – 2021-03-01 (×3): 5 mg via ORAL
  Filled 2021-02-27 (×3): qty 1

## 2021-02-27 MED ORDER — ADULT MULTIVITAMIN W/MINERALS CH
1.0000 | ORAL_TABLET | Freq: Every day | ORAL | Status: DC
  Administered 2021-02-27 – 2021-03-01 (×3): 1 via ORAL
  Filled 2021-02-27 (×3): qty 1

## 2021-02-27 MED ORDER — HYDROCORTISONE 5 MG PO TABS
5.0000 mg | ORAL_TABLET | Freq: Every day | ORAL | Status: DC
  Administered 2021-02-27 – 2021-02-28 (×2): 5 mg via ORAL
  Filled 2021-02-27 (×3): qty 1

## 2021-02-27 MED ORDER — DEXTROSE 50 % IV SOLN
12.5000 g | INTRAVENOUS | Status: AC
  Administered 2021-02-27: 12.5 g via INTRAVENOUS
  Filled 2021-02-27: qty 50

## 2021-02-27 MED ORDER — PROSOURCE PLUS PO LIQD
30.0000 mL | Freq: Two times a day (BID) | ORAL | Status: DC
  Administered 2021-02-28 – 2021-03-01 (×3): 30 mL via ORAL
  Filled 2021-02-27 (×3): qty 30

## 2021-02-27 MED ORDER — BOOST / RESOURCE BREEZE PO LIQD CUSTOM
1.0000 | Freq: Three times a day (TID) | ORAL | Status: DC
  Administered 2021-02-27 – 2021-03-01 (×3): 1 via ORAL
  Filled 2021-02-27: qty 1

## 2021-02-27 NOTE — Progress Notes (Addendum)
Family Medicine Teaching Service Daily Progress Note Intern Pager: [phone]  Patient name: Sheila Flynn Medical record number: [id number] Date of birth: Mar 02, 1946 Age: 75 y.o. Gender: female  Primary Care Provider: Patient, No Pcp Per (Inactive) Consultants: Onc Code Status: Full  Pt Overview and Major Events to Date:  4/18 admitted  Assessment and Plan: Florrie G Johnsonis a 75 y.o.femalepresenting via EMS with right sided weakness and facial drooping, found to be persistently hypoglycemic. PMH is significant fordiastolic CHF, stroke, CAD, COPD, T2DM, GERD, rectal ca s/p colostomy, vascular dementia, depression.  Hypoglycemia  T2DM Patient without complaints. No nausea, vomiting, abdominal pain. Eating well, little bit of difficulty (choked on dinner last night). Leading diagnosis adrenal insufficiency. S/p fludrocortisone 0.1 mg 4/19-21.  -Onc consulted, appreciate recommendations -Awaiting sulfonylurea panel, fecal elastase - Will trial hydrocortisone TID (10 mg AM, 5 mg PM, 5 mg qHS) - Consider MR abdomen w+w/o contrast once GFR >30 -AM CMP - mIVF w/ NS @100mL /hr -vitals per unit routine -PT/OT eval and treat  Elevated Creatinine Glucosuria Cr 1.75, improved from yesterday. Now at level on admission. Baseline 1.3-1.45. Currently on mIVF with soft diet.  - AM BMP - NS mIVF 100 mL/hr - hold losartan  Hypertension Intermittently hypertensive with systolics in the 948N-462V. On home antihypertensives. No increases to meds at this time due to persistent borderline low diastolics in 03J-00X.  - continue to monitor  CHF  CAD HLD Home meds include atorvastatin 40 mg qHS, isosorbide 60 mg qAM, nitroglycerin sublingual PRN. -Continue home meds - Adjusted isosorbide to 20 BID 4/20  GERD Home meds of omeprazole 20 mg daily, zofran 4 mg PRN. -Continue home meds  Vascular dementia  Depression Insomnia At home takes trazodone 100 mg qHS, melatonin  10 mg qHS. -Continue home meds  Rectal cancer s/p colostomy Normal colostomy care.  COPD Listed in chart, but no home meds for this. Last 24 hours,SpO2 >94% on room air.   Chronic constipation ContinueHome med of senokot qAM.   Low vitamin D ContinueHome med of cholecalciferol 5,000 units qAM.    FEN/GI: soft PPx: lovenox   Status is: Inpatient  Remains inpatient appropriate because:Ongoing diagnostic testing needed not appropriate for outpatient work up   Dispo: The patient is from: SNF              Anticipated d/c is to: SNF              Patient currently is not medically stable to d/c.   Difficult to place patient No   Subjective:  Patient found sitting in bed, awake and comfortable. No complaints. Denies nausea, vomiting, abdominal pain, fatigue.   Objective: Temp:  [98.5 F (36.9 C)-98.7 F (37.1 C)] 98.7 F (37.1 C) (04/21 0737) Pulse Rate:  [64-98] 67 (04/21 0737) Resp:  [15-20] 15 (04/21 0737) BP: (120-172)/(52-93) 172/60 (04/21 0807) SpO2:  [95 %-97 %] 97 % (04/21 0737) Physical Exam: General: awake, NAD Cardiovascular: RRR, no murmur Respiratory: CTAB  Laboratory: Recent Labs  Lab 02/24/21 1118 02/24/21 1124 02/25/21 0327 02/26/21 0322 02/27/21 0152  WBC 8.6  --  5.2 4.8  --   HGB 12.5   < > 10.4* 9.5* 9.2*  HCT 41.6   < > 33.5* 31.6* 30.0*  PLT 273  --  214 204  --    < > = values in this interval not displayed.   Recent Labs  Lab 02/24/21 1118 02/24/21 1124 02/25/21 0327 02/26/21 0322 02/27/21 0152  NA 141   < >  138 140 140  K 4.0   < > 3.5 3.9 3.7  CL 103   < > 103 107 108  CO2 27  --  30 26 28   BUN 22   < > 16 19 18   CREATININE 1.76*   < > 1.79* 1.98* 1.75*  CALCIUM 9.5  --  8.7* 8.6* 8.3*  PROT 7.9  --  5.9*  --   --   BILITOT 0.4  --  0.6  --   --   ALKPHOS 61  --  47  --   --   ALT 19  --  14  --   --   AST 24  --  19  --   --   GLUCOSE 43*   < > 94 93 75   < > = values in this interval not displayed.     Imaging/Diagnostic Tests: None last 24 hours.   Ezequiel Essex, MD 02/27/2021, 11:51 AM PGY-1, [place] Intern pager: [phone], text pages welcome

## 2021-02-27 NOTE — Evaluation (Signed)
Clinical/Bedside Swallow Evaluation Patient Details  Name: KEYARAH MCROY MRN: [id number] Date of Birth: [birth year]  Today's Date: 02/27/2021 Time: SLP Start Time (ACUTE ONLY): 1224 SLP Stop Time (ACUTE ONLY): 1254 SLP Time Calculation (min) (ACUTE ONLY): 30 min  Past Medical History:  Past Medical History:  Diagnosis Date  . Acute respiratory failure ([place]) 12/21/10  . Aspiration pneumonia ([place])   . CAD (coronary artery disease)    Dr. Matthew Saras  . Chronic constipation   . Chronic diastolic CHF (congestive heart failure) ([place])   . CKD (chronic kidney disease)   . COPD (chronic obstructive pulmonary disease) ([place])   . Delirium, induced by drug    polypharmacy  . Dementia ([place])   . 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 GERD (gastroesophageal reflux disease)   . Hypertensive heart disease with congestive heart failure ([place]) 05/11/2016  . Major depressive disorder   . Malignant essential hypertension with congestive heart failure with chronic kidney disease ([place])   . Migraine    "monthly" (11/06/2014)  . Myocardial infarction ([place])    "I've had about 3" (11/06/2014)  . On home oxygen therapy    "prn at the nursing home" (11/06/2014)  . Pulmonary hypertension ([place])   . Rectal cancer ([place])    s/p colostomy  . Sacral fracture, closed ([place])    s/p kyphoplasty  . Stroke [facility]) 2011   postop  . Stroke [facility]) ?2014   "left side sometimes weaker since" (11/06/2014)  . Thyroid goiter   . Vascular dementia with depressed mood ([place])    Past Surgical History:  Past Surgical History:  Procedure Laterality Date  . ABDOMINAL HYSTERECTOMY    . APPENDECTOMY    . COLON SURGERY    . COLOSTOMY     rectal ca  . KYPHOPLASTY     sacrum  . right cataract  11-19-2015  . TONSILLECTOMY    . TUBAL LIGATION     HPI:  75 y.o. female with a medical history significant for coronary artery disease,  hypertension, hyperlipidemia, chronic diastolic heart failure, COPD, chronic kidney disease, dementia, type 2 diabetes mellitus, dysarthria due to history of CVA, GERD, major depressive disorder, stroke with possible residual left hemibody weakness since 2015, pulmonary hypertension, and rectal cancer s/p colostomy who presented to the ED via EMS after an acute episode of unresponsiveness witnessed today at her nursing facility. According to facility staff, patient was sitting at a table playing Bingo when she suddenly slumped over, became unresponsive, and started drooling. Her blood pressure was taken and she was found to be hypertensive with a systolic blood pressure greater than 200. Failed [facility] (02/24/21), then passed later that day per MD note. SLP screened (02/25/21) with RN reporting pt doing well with current diet and med intake. No needs identified at that time and SLP signed off. BSE reordered with MD noting "Patient had episode of choking on food. RN noted patient had difficulty clearing food in posterior orpharynx" (02/26/21). Previous MBS (2018), performed after pt had choking incident at SNF, revealed normal orpharyngeal swallow with recommendation of regular diet/thin liquids.   Assessment / Plan / Recommendation Clinical Impression  Pt seen for bedside swallow evaluation alert and repositioned upright in bed. Oral mechanism examination marked by L facial asymmetry, but this was not observed to impact functional oropharyngeal swallow. Prior to POs, pt noted to swallow saliva buildup repeatedly and appeared to manage these secretions  well. Thin liquids via consecutive straw sips and bites of puree and complex regular textured solids resulted in no overt s/sx of aspiration. x2 instances of coughing noted before regular solid intake and right after completion of evaluation and suspect premature spillage of excessive saliva. Recommend pt continue on soft diet (per RN's last order) and thin liquids with  SLP to f/u for tolerance and upgraded texture trials.  SLP Visit Diagnosis: Dysphagia, unspecified (R13.10)    Aspiration Risk  Mild aspiration risk    Diet Recommendation Thin liquid;Regular   Liquid Administration via: Straw;Cup Medication Administration: Whole meds with liquid Supervision: Intermittent supervision to cue for compensatory strategies;Staff to assist with self feeding Compensations: Slow rate;Small sips/bites Postural Changes: Seated upright at 90 degrees    Other  Recommendations Oral Care Recommendations: Oral care BID;Staff/trained caregiver to provide oral care   Follow up Recommendations  (TBD)      Frequency and Duration min 2x/week  2 weeks       Prognosis Prognosis for Safe Diet Advancement: Good Barriers to Reach Goals: Cognitive deficits      Swallow Study   General Date of Onset: 02/26/21 HPI: 75 y.o. 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 According to facility staff, patient was sitting at a table playing Bingo when she suddenly slumped over, became unresponsive, and started drooling. Her blood pressure was taken and she was found to be hypertensive with a systolic blood pressure greater than 200. Failed [facility] (02/24/21), then passed later that day per MD note. SLP screened (02/25/21) with RN reporting pt doing well with current diet and med intake. No needs identified at that time and SLP signed off. BSE reordered with MD noting "Patient had episode of choking on food. RN noted patient had difficulty  clearing food in posterior orpharynx" (02/26/21). Previous MBS (2018), performed after pt had choking incident at SNF, revealed normal orpharyngeal swallow with recommendation of regular diet/thin liquids. Type of Study: Bedside Swallow Evaluation Previous Swallow Assessment:  (See HPI) Diet Prior to this Study: Regular;Thin liquids (soft) Temperature Spikes Noted: No Respiratory Status: Room air History of Recent Intubation: No Behavior/Cognition: Alert;Cooperative;Pleasant mood;Confused Oral Cavity Assessment: Within Functional Limits (saliva buildup) Oral Care Completed by SLP: No Oral Cavity - Dentition: Dentures, top;Other (Comment) (partial, bottom) Vision: Functional for self-feeding Self-Feeding Abilities: Able to feed self;Needs assist Patient Positioning: Upright in bed;Postural control adequate for testing Baseline Vocal Quality: Normal Volitional Cough: Weak Volitional Swallow: Able to elicit    Oral/Motor/Sensory Function Overall Oral Motor/Sensory Function: Within functional limits   Ice Chips Ice chips: Not tested   Thin Liquid Thin Liquid: Within functional limits Presentation: Self Fed;Straw    Nectar Thick Nectar Thick Liquid: Not tested   Honey Thick Honey Thick Liquid: Not tested   Puree Puree: Within functional limits Presentation: Spoon   Solid     Solid: Within functional limits Presentation: [place], [place], [place] Office Number: [phone]  Acie Fredrickson 02/27/2021,1:19 PM

## 2021-02-27 NOTE — Progress Notes (Signed)
While changing patient colostomy bag, this nurse noted RUE arm swelling. PIV working well, patient states painful to touch and arm is sore. Attempted to place PIV in LUE but unsuccesful will place IV team consult . Leaving right PIV in place SL for now; Paged Dr on call and made aware.

## 2021-02-27 NOTE — Progress Notes (Addendum)
Notified that patient had edematous RUE. Reported to bedside to evaluate patient. Patient states she noticed the swelling just prior to lunch today. She has not had any prior episodes of right upper extremity swelling prior to this. She reports the arm is tender. Swelling only from antecubital fossa and hand. Patient reports right hand swelling as well.   PE  Today's Vitals   02/27/21 0737 02/27/21 0807 02/27/21 1220 02/27/21 1608  BP: (!) 158/55 (!) 172/60 (!) 162/66 (!) 175/60  Pulse: 67   70  Resp: 15  20 18   Temp: 98.7 F (37.1 C)  98.7 F (37.1 C) 98.5 F (36.9 C)  TempSrc: Oral  Oral Oral  SpO2: 97%  97% 95%  Weight:      Height:      PainSc:  0-No pain     Body mass index is 23.27 kg/m.  General: female appearing stated age in no acute distress, sitting upright in bed  Cardio: Normal S1 and S2, no S3 or S4. Rhythm is regular. Right radial pulse palpable and strong  Pulm: Clear to auscultation bilaterally, no crackles, wheezing, or diminished breath sounds. Normal respiratory effort, stable on RA Extremities: RUE with edema, no erythema, extremity does not feel warm to touch compared to other UE, edema non-pitting palpated from antecubital fossa of RUE as well as edematous digits on affected hand, radial pulse palpable, no skin tears Neuro: pt alert and oriented x4    A/P  Patient has received IVFs and D5 to help with hypoglycemia with IV in affected hand since admission. Could consider compartment syndrome, cellulitis (lower suspicion for this given no erythema of skin and afebrile), or soft tissue damage.  - will order RUE soft tissue ultrasound   - IV RN at bedside to replace IV access in LUE  - will check blood pressure in opposite extremity    Eulis Foster, MD  [place], PGY-2  [facility] Intern Pager [phone]

## 2021-02-27 NOTE — Plan of Care (Signed)

## 2021-02-27 NOTE — Progress Notes (Signed)
Spoke with Dr. Benay Spice via phone.  He recommended consideration for chest CT without contrast to look for metastatic lung disease as this would help with determining prognosis and with the possibility of determining if the patient was a surgical candidate should it be thought that her hypoglycemic episodes are coming from her renal cell carcinoma.  He also recommended an MRI abdomen with contrast if the patient's kidney function allowed.   He stated he was continuing to monitor the patient's chart and would let us know recommendations alongside our further work-up.  I discussed with him that at this point we were trying to treat her for adrenal insufficiency due to her hypoglycemic episodes and considering imaging tomorrow and he agreed that that was reasonable at this time.  Greatly appreciate the advice, experience and expertise of Dr. Benay Spice and the oncology team.

## 2021-02-28 ENCOUNTER — Inpatient Hospital Stay (HOSPITAL_COMMUNITY): Payer: Medicare (Managed Care)

## 2021-02-28 DIAGNOSIS — R4182 Altered mental status, unspecified: Secondary | ICD-10-CM | POA: Diagnosis not present

## 2021-02-28 DIAGNOSIS — E1149 Type 2 diabetes mellitus with other diabetic neurological complication: Secondary | ICD-10-CM | POA: Diagnosis not present

## 2021-02-28 DIAGNOSIS — R531 Weakness: Secondary | ICD-10-CM | POA: Diagnosis not present

## 2021-02-28 DIAGNOSIS — E271 Primary adrenocortical insufficiency: Secondary | ICD-10-CM

## 2021-02-28 DIAGNOSIS — I5032 Chronic diastolic (congestive) heart failure: Secondary | ICD-10-CM | POA: Diagnosis not present

## 2021-02-28 DIAGNOSIS — M7989 Other specified soft tissue disorders: Secondary | ICD-10-CM

## 2021-02-28 DIAGNOSIS — E440 Moderate protein-calorie malnutrition: Secondary | ICD-10-CM | POA: Insufficient documentation

## 2021-02-28 LAB — GLUCOSE, CAPILLARY
Glucose-Capillary: 106 mg/dL — ABNORMAL HIGH (ref 70–99)
Glucose-Capillary: 139 mg/dL — ABNORMAL HIGH (ref 70–99)
Glucose-Capillary: 140 mg/dL — ABNORMAL HIGH (ref 70–99)
Glucose-Capillary: 176 mg/dL — ABNORMAL HIGH (ref 70–99)
Glucose-Capillary: 195 mg/dL — ABNORMAL HIGH (ref 70–99)
Glucose-Capillary: 78 mg/dL (ref 70–99)
Glucose-Capillary: 85 mg/dL (ref 70–99)
Glucose-Capillary: 90 mg/dL (ref 70–99)

## 2021-02-28 LAB — CORTISOL-AM, BLOOD: Cortisol - AM: 3.7 ug/dL — ABNORMAL LOW (ref 6.7–22.6)

## 2021-02-28 LAB — BASIC METABOLIC PANEL
Anion gap: 5 (ref 5–15)
BUN: 17 mg/dL (ref 8–23)
CO2: 27 mmol/L (ref 22–32)
Calcium: 8.2 mg/dL — ABNORMAL LOW (ref 8.9–10.3)
Chloride: 106 mmol/L (ref 98–111)
Creatinine, Ser: 1.65 mg/dL — ABNORMAL HIGH (ref 0.44–1.00)
GFR, Estimated: 33 mL/min — ABNORMAL LOW (ref >60)
Glucose, Bld: 122 mg/dL — ABNORMAL HIGH (ref 70–99)
Potassium: 3.5 mmol/L (ref 3.5–5.1)
Sodium: 138 mmol/L (ref 135–145)

## 2021-02-28 LAB — PANCREATIC ELASTASE, FECAL: Pancreatic Elastase-1, Stool: 169 ug Elast./g — ABNORMAL LOW (ref >200)

## 2021-02-28 MED ORDER — POLYETHYLENE GLYCOL 3350 17 G PO PACK: 17.0000 g | PACK | Freq: Every day | ORAL | 0 refills | Status: AC

## 2021-02-28 MED ORDER — ISOSORBIDE MONONITRATE 20 MG PO TABS: 20.0000 mg | ORAL_TABLET | Freq: Two times a day (BID) | ORAL | Status: AC

## 2021-02-28 MED ORDER — ADULT MULTIVITAMIN W/MINERALS CH
1.0000 | ORAL_TABLET | Freq: Every day | ORAL | Status: AC
Start: 2021-03-01 — End: ?

## 2021-02-28 MED ORDER — HYDROCORTISONE 10 MG PO TABS: 10.0000 mg | ORAL_TABLET | Freq: Every day | ORAL | Status: AC

## 2021-02-28 MED ORDER — HYDROCORTISONE 5 MG PO TABS: 5.0000 mg | ORAL_TABLET | Freq: Every day | ORAL | Status: DC

## 2021-02-28 MED ORDER — HYDROCORTISONE 5 MG PO TABS: 5.0000 mg | ORAL_TABLET | Freq: Every day | ORAL | Status: AC

## 2021-02-28 MED ORDER — PROSOURCE PLUS PO LIQD: 30.0000 mL | Freq: Two times a day (BID) | ORAL | Status: AC

## 2021-02-28 NOTE — Progress Notes (Signed)
Upper extremity venous has been completed.   Preliminary results in CV Proc.   Abram Sander 02/28/2021 11:13 AM

## 2021-02-28 NOTE — Discharge Summary (Signed)
[facility] Discharge Summary  Patient name: Sheila Flynn Medical record number: [id number] Date of birth: 1946/07/30 Age: 75 y.o. Gender: female Date of Admission: 02/24/2021  Date of Discharge: 03/01/2021 Admitting Physician: Ezequiel Essex, MD  Primary Care Provider: Patient, No Pcp Per (Inactive) Consultants: Oncology  Indication for Hospitalization: Altered mental status and right-sided hemiparesis  Discharge Diagnoses/Problem List:  Hyperglycemia Type 2 diabetes Hypertension CHF CAD Hyperlipidemia GERD Vascular dementia Depression Insomnia rectal cancer s/p colostomy COPD Chronic constipation Low vitamin D  Disposition: SNF  Discharge Condition: Stable  Discharge Exam:  General: Elderly female resting in bed comfortably, NAD CV: RRR, no murmurs Respiratory: Clear to auscultation bilaterally, breathing comfortably on room air Abdomen: Soft, nontender, positive bowel sounds Neuro: Alert and oriented x3, mild left facial droop at baseline   Brief Hospital Course:  Sheila Flynn is a 75 year old female who presented via EMS as a code stroke for right-sided weakness and facial drooping, found to be persistently hypoglycemic.  Symptoms improved with euvolemia. PMH is significant for diastolic CHF, stroke, CAD, COPD, T2DM, GERD, rectal ca s/p colostomy, vascular dementia, depression  Right sided weakness, right sided facial droop  Hypoglycemia  Hx stroke Patient presents to ED via EMS for unresponsiveness. Was reportedly at her neurologic baseline at her nursing facility morning of 4/18 when she suddenly became less responsive and started drooling. EMS incoded with CODE STROKE after examining patient and finding right-sided facial drooping and weakness. They also found her to have glucose of 42, administered dextrose, after which her unilateral symptoms improved.  However, the patient's glucose did continue to drop back into the 50s-70s  throughout the day after this.  CT head on presentation was negative for acute ischemic process, but did demonstrate old infarcts and chronic vascular disease. Stroke team declined tPA, felt that her symptoms more likely due to hypoglycemia. ED provider found that despite initial improvement in symptoms and serum glucose after EMS dextrose administration, patient's CBG decreased again to the 50s; she was started on D5NS drip to maintain euglycemia. CXR unremarkable. CBC with differential and CMP unremarkable aside from glucose of 44 and creatinine 1.80. UA showed 50 glucose, 100 protein, rare bacteria. UDS negative. RUQ ultrasound negative for hepatic structural abnormality. Respiratory panel negative. Patient A&Ox3 on admission, denies taking insulin, glyburides, or other medications for her type 2 diabetes.  Right upper quadrant ultrasound negative for structural hepatic abnormality.  Renal ultrasound demonstrated increased cortical echogenicity compatible with medical renal disease, nonobstructing 5 mm right renal calculus, 5.4 similar solid mass lesion of upper left kidney likely renal cell carcinoma (similar to previous CT in 2019), additional 2.7 cm left renal cyst.  Amylase slightly high at 127, lipase normal at 34.  Pancreatic stool elastase slightly decreased at 169.  A.m. cortisol low at 3.7 (although collected at 3 AM).  Random insulin 13.8, C-peptide 5.9, BHB 0.12.  TSH low normal.  Serum ethanol negative.  Sulfonylurea hyperglycemia panel still active and in process.  Patient thought to be experiencing true insufficiency, was trialed on Cortef 3 times daily with marked improvement of glucose measurements off of glucose containing IV fluids.  Patient discharged on Cortef 3 times daily (10, 5, 5) with plan for close PCP follow-up.   Issues for Follow Up:  1. Change in isosorbide mononitrate dosing.  Patient admitted with home prescription of isosorbide mononitrate 60 mg daily.  Pharmacy noted while  inpatient that the max dose of isosorbide mononitrate is usually 40 mg/day and that this  formulation is normally dosed twice daily.  We suspect this was a med entry error that has persisted for the last 7 years per chart review.  We changed patient's dosing to 20 mg twice daily with good response, tolerated well. 2. Evidence for pancreatic insufficiency.  Pancreatic fecal elastase measured moderately low at 169.  Suggest endocrine follow-up. 3. Stable renal cyst seen on imaging as below.  Radiology suggests likely renal cell carcinoma.  This is stable from previous imaging, although never previously worked up extensively.  Oncology consulted while inpatient.  Requested CT chest without contrast, MR abdomen with and without for evaluation and possible staging. 4. Likely pulmonary arterial hypertension, demonstrated on CT chest without contrast on 02/28/2021.  Follow-up with outpatient, correlate clinically. 5. Patient with known goiter.  This goiter and thyroid abnormality was again demonstrated on imaging.  There was diffuse marked nodular enlargement of thyroid gland with multiple calcified nodules and also a right peritracheal substernal goiter that displaced her trachea.  All stable from previous imaging.  Significant Procedures: None  Significant Labs and Imaging:  Recent Labs  Lab 02/24/21 1118 02/24/21 1124 02/25/21 0327 02/26/21 0322 02/27/21 0152  WBC 8.6  --  5.2 4.8  --   HGB 12.5   < > 10.4* 9.5* 9.2*  HCT 41.6   < > 33.5* 31.6* 30.0*  PLT 273  --  214 204  --    < > = values in this interval not displayed.   Recent Labs  Lab 02/24/21 1118 02/24/21 1124 02/24/21 2032 02/25/21 0327 02/26/21 0322 02/27/21 0152 02/28/21 0233 03/01/21 0157  NA 141   < >  --  138 140 140 138 140  K 4.0   < >  --  3.5 3.9 3.7 3.5 3.4*  CL 103   < >  --  103 107 108 106 106  CO2 27  --   --  30 26 28 27 28   GLUCOSE 43*   < >  --  94 93 75 122* 68*  BUN 22   < >  --  16 19 18 17 17   CREATININE  1.76*   < >  --  1.79* 1.98* 1.75* 1.65* 1.73*  CALCIUM 9.5  --   --  8.7* 8.6* 8.3* 8.2* 8.6*  MG  --   --  1.9  --   --   --   --   --   PHOS  --   --  2.5  --   --   --   --   --   ALKPHOS 61  --   --  47  --   --   --   --   AST 24  --   --  19  --   --   --   --   ALT 19  --   --  14  --   --   --   --   ALBUMIN 3.9  --   --  3.0*  --   --   --   --    < > = values in this interval not displayed.    CHEST - 2 VIEW 02/24/2021 IMPRESSION: No active cardiopulmonary disease.  CT HEAD WITHOUT CONTRAST 02/24/2021 IMPRESSION: 1. No evidence of acute large vascular territory infarct or acute hemorrhage. ASPECTS is 10. 2. Advanced chronic microvascular ischemic disease with multiple old infarcts, as detailed above. An acute white matter infarct could be easily obscured by the patient's  white matter disease and MRI could better evaluate for acute infarct if clinically indicated.  MRI HEAD WITHOUT CONTRAST IMPRESSION: 1. No evidence of acute intracranial abnormality. Specifically, no acute infarct. 2. Advanced chronic microvascular ischemic disease and multiple prior infarcts, as detailed above.  ULTRASOUND ABDOMEN LIMITED RIGHT UPPER QUADRANT 02/24/2021 IMPRESSION: Increased echogenicity within the right kidney consistent with the given clinical history. Mild hydronephrosis is noted. No acute abnormality in the right upper quadrant is noted.  RENAL / URINARY TRACT ULTRASOUND COMPLETE 02/25/2021 IMPRESSION: 1. 5.4 cm solid mass lesion in the upper left kidney compatible with a renal cell carcinoma, similar to comparison CT accounting for differences in imaging technique. 2. Additional 2.7 cm left renal cyst. 3. Nonobstructing 5 mm right renal calculus. No hydronephrosis. 4. Increased cortical echogenicity compatible with medical renal disease. 5. Decompressed urinary bladder despite 90 minutes of filling time. Correlate for diminished renal function. No gross bladder abnormality  is seen.  CT CHEST WITHOUT CONTRAST 02/28/2021 IMPRESSION: 1. Diffuse marked nodular enlargement of the thyroid gland with multiple calcified nodules. Right paratracheal substernal goiter. The thyroid gland displaces the trachea towards the left with focal narrowing of the trachea, also similar to previous study. 2. Large heterogeneous appearing mass in the upper pole left kidney measuring 5.9 cm in diameter. Appearance as on the prior studies continues to be worrisome for renal cell carcinoma. There is slight enlargement since the prior study. 3. Prominence of pulmonary arteries suggest arterial hypertension. 4. Aortic atherosclerosis. Coronary artery, aortic, and mitral valve calcifications. 5. Small pericardial effusion. 6. Small bilateral pleural effusions with basilar atelectasis.  MR ABDOMEN W AND WO CONTRAST 02/28/2021 IMPRESSION: 1. Since 10/21/2017 there has been interval enlargement of solid, round, exophytic enhancing lesion arising off the upper pole of left kidney. This remains worrisome for renal cell carcinoma. No findings to suggest solid organ or nodal metastasis within the imaged portions of the abdomen. Referral to urology is advised. 2. Small pericardial effusion. Trace bilateral pleural effusions. Airspace disease within the right lower lobe. 3. Left retroperitoneal/periaortic varicosities.  Results/Tests Pending at Time of Discharge: Sulfonylurea panel  Discharge Medications:  Allergies as of 03/01/2021      Reactions   Ace Inhibitors Swelling      Medication List    TAKE these medications   (feeding supplement) PROSource Plus liquid Take 30 mLs by mouth 2 (two) times daily between meals.   ACETAMINOPHEN PO Take 2 tablets by mouth every 6 (six) hours as needed (pain).   amLODipine 10 MG tablet Commonly known as: NORVASC Take 10 mg by mouth every morning.   aspirin 81 MG EC tablet Take 81 mg by mouth every morning. Swallow whole.   atorvastatin  40 MG tablet Commonly known as: LIPITOR Take 40 mg by mouth at bedtime.   carvedilol 6.25 MG tablet Commonly known as: COREG Take 6.25 mg by mouth 2 (two) times daily.   hydrALAZINE 25 MG tablet Commonly known as: APRESOLINE Take 25 mg by mouth 2 (two) times daily with a meal.   hydrocortisone 10 MG tablet Commonly known as: CORTEF Take 1 tablet (10 mg total) by mouth daily with breakfast.   hydrocortisone 5 MG tablet Commonly known as: CORTEF Take 1 tablet (5 mg total) by mouth daily with lunch.   hydrocortisone 5 MG tablet Commonly known as: CORTEF Take 1 tablet (5 mg total) by mouth daily with supper.   isosorbide mononitrate 20 MG tablet Commonly known as: ISMO Take 1 tablet (20 mg total) by  mouth 2 (two) times daily at 10 AM and 5 PM. What changed:   how much to take  when to take this   losartan 50 MG tablet Commonly known as: COZAAR Take 50 mg by mouth 2 (two) times daily with a meal.   Melatonin 10 MG Tabs Take 10 mg by mouth at bedtime.   multivitamin with minerals Tabs tablet Take 1 tablet by mouth daily.   nitroGLYCERIN 0.4 MG SL tablet Commonly known as: NITROSTAT Place 0.4 mg under the tongue every 5 (five) minutes as needed for chest pain.   omeprazole 20 MG capsule Commonly known as: PRILOSEC Take 20 mg by mouth daily at 6 (six) AM.   ondansetron 4 MG tablet Commonly known as: ZOFRAN Take 4 mg by mouth every 8 (eight) hours as needed for nausea or vomiting.   oxyCODONE 5 MG immediate release tablet Commonly known as: Oxy IR/ROXICODONE Take 5 mg by mouth every 12 (twelve) hours as needed (pain).   polyethylene glycol 17 g packet Commonly known as: MIRALAX / GLYCOLAX Take 17 g by mouth daily.   sennosides-docusate sodium 8.6-50 MG tablet Commonly known as: SENOKOT-S Take 1 tablet by mouth every morning.   Tears Pure 0.1-0.3 % Soln Generic drug: Dextran 70-Hypromellose Place 1 drop into both eyes every 8 (eight) hours as needed (dry  eyes).   traZODone 100 MG tablet Commonly known as: DESYREL Take 100 mg by mouth at bedtime.   Vitamin D-3 125 MCG (5000 UT) Tabs Take 5,000 Units by mouth every morning.       Discharge Instructions: Please refer to Patient Instructions section of EMR for full details.  Patient was counseled important signs and symptoms that should prompt return to medical care, changes in medications, dietary instructions, activity restrictions, and follow up appointments.   Follow-Up Appointments:   Zola Button, MD 03/01/2021, 7:54 AM PGY-1, [place]

## 2021-02-28 NOTE — Progress Notes (Signed)
Paged med onc physician on call at [phone]. Listed as med onc covering [facility], [facility], and [facility].   This is page #2. Awaiting call back.   Just want to clarify about necessity of imaging (CT chest, MR abd) prior to discharge. We are looking at discharge hopefully tomorrow given that her CBGs have remained stable while on cortef TID. Likely adrenal insufficiency.   Ezequiel Essex, MD

## 2021-02-28 NOTE — Evaluation (Signed)
Occupational Therapy Evaluation Patient Details Name: Sheila Flynn MRN: [id number] DOB: [birth year] Today's Date: 02/28/2021    History of Present Illness Sheila Flynn is a 75 y.o. female presenting via EMS with right sided weakness and facial drooping, found to be persistently hypoglycemic. PMH is significant for diastolic CHF, stroke, CAD, COPD, T2DM, GERD, rectal ca s/p colostomy, vascular dementia, depression.   Clinical Impression   Pt in bed upon arrival and agreeable to OT eval. Pt is a LTC resident of a SNF and pt reports that staff provided total A with LB selfcare and toiletng, assist to sit EOB and 1 person assist to SPT to w/c; pt propels self in w/c and reports that she is OOB daily. Pt currently at baseline with ADLs/selfcare and sat EOB min A. No further acute OT services are indicated at this time, defer any further OT intervention to SNF. OT will sign off    Follow Up Recommendations  SNF;Supervision/Assistance - 24 hour    Equipment Recommendations  None recommended by OT    Recommendations for Other Services       Precautions / Restrictions Precautions Precautions: Fall Restrictions Weight Bearing Restrictions: No      Mobility Bed Mobility Overal bed mobility: Needs Assistance Bed Mobility: Rolling;Supine to Sit;Sit to Supine Rolling: Supervision (used rails, no physical assist)   Supine to sit: Min assist Sit to supine: Min assist   General bed mobility comments: min A to elevate trunk, min A with LEs back onto bed. Pt able to use rails to scoot to [facility] in supine    Transfers                 General transfer comment: deferred    Balance Overall balance assessment: Needs assistance Sitting-balance support: Bilateral upper extremity supported;Feet supported Sitting balance-Leahy Scale: Fair                                     ADL either performed or assessed with clinical judgement   ADL                                          General ADL Comments: at baseline. Set up with UB bathing, dressing, self feeding and grooming. Total A with LB ADLs and toileting     Vision Baseline Vision/History: Wears glasses Patient Visual Report: No change from baseline       Perception     Praxis      Pertinent Vitals/Pain Pain Assessment: No/denies pain Faces Pain Scale: No hurt     Hand Dominance Left   Extremity/Trunk Assessment Upper Extremity Assessment Upper Extremity Assessment:  (B UES WFL AROM) RUE Deficits / Details: weakness from prior CVA, 3/5 grossly   Lower Extremity Assessment Lower Extremity Assessment: Defer to PT evaluation   Cervical / Trunk Assessment Cervical / Trunk Assessment: Normal   Communication Communication Communication: Expressive difficulties   Cognition Arousal/Alertness: Awake/alert Behavior During Therapy: WFL for tasks assessed/performed Overall Cognitive Status: No family/caregiver present to determine baseline cognitive functioning                                 General Comments: Pt A & Ox 3, states that she is in hospital because her "  sugar dropped"   General Comments       Exercises     Shoulder Instructions      Home Living Family/patient expects to be discharged to:: Skilled nursing facility                                        Prior Functioning/Environment Level of Independence: Needs assistance    ADL's / Homemaking Assistance Needed: set up with UB selfcare, feeds self, grooms self with set up   Comments: Pt reports that staff provided total A with LB selfcare and toiletng, assist to sit EOB and 1 person assist to SPT to w/c; pt propels self in w/c and reports that she is OOB daily        OT Problem List: Decreased strength;Impaired balance (sitting and/or standing);Decreased activity tolerance;Decreased coordination      OT Treatment/Interventions:      OT Goals(Current goals can  be found in the care plan section) Acute Rehab OT Goals Patient Stated Goal: go back to nursing home OT Goal Formulation: With patient  OT Frequency:     Barriers to D/C:            Co-evaluation              AM-PAC OT "6 Clicks" Daily Activity     Outcome Measure Help from another person eating meals?: A Little Help from another person taking care of personal grooming?: A Little Help from another person toileting, which includes using toliet, bedpan, or urinal?: Total Help from another person bathing (including washing, rinsing, drying)?: A Lot Help from another person to put on and taking off regular upper body clothing?: A Little Help from another person to put on and taking off regular lower body clothing?: Total 6 Click Score: 13   End of Session    Activity Tolerance: Patient tolerated treatment well Patient left: in bed;with call bell/phone within reach;with bed alarm set  OT Visit Diagnosis: Other abnormalities of gait and mobility (R26.89);Muscle weakness (generalized) (M62.81)                Time: 8101-7510 OT Time Calculation (min): 26 min Charges:  OT General Charges $OT Visit: 1 Visit OT Evaluation $OT Eval Moderate Complexity: 1 Mod OT Treatments $Self Care/Home Management : 8-22 mins    Britt Bottom 02/28/2021, 2:38 PM

## 2021-02-28 NOTE — Evaluation (Signed)
Physical Therapy Evaluation and Discharge Patient Details Name: Sheila Flynn MRN: [id number] DOB: 10/12/1946 Today's Date: 02/28/2021   History of Present Illness  Sheila Flynn is a 75 y.o. female presenting via EMS with unresponsiveness; found to be persistently hypoglycemic. Pt also found to have L upper kidney lesion; workup pending. PMH is significant for diastolic CHF, stroke, CAD, COPD, T2DM, GERD, rectal ca s/p colostomy, vascular dementia, depression.  Clinical Impression  Pt admitted secondary to problem above with deficits below. Pt requiring min A for bed mobility and mod A for lateral scoot transfers. Reports she is at [facility] and feels she is close to baseline. Recommend return to SNF at d/c. All further needs can be assessed upon return to SNF. Will sign off. If needs change, please re-consult.     Follow Up Recommendations SNF (return to SNF)    Equipment Recommendations  None recommended by PT    Recommendations for Other Services       Precautions / Restrictions Precautions Precautions: Fall Restrictions Weight Bearing Restrictions: No      Mobility  Bed Mobility Overal bed mobility: Needs Assistance Bed Mobility: Supine to Sit;Sit to Supine Rolling: Supervision (used rails, no physical assist)   Supine to sit: Min assist Sit to supine: Min assist   General bed mobility comments: Min A for trunk assist to come to sitting. Required min A for LE assist for return to supine.    Transfers Overall transfer level: Needs assistance Equipment used: None Transfers: Lateral/Scoot Transfers          Lateral/Scoot Transfers: Mod assist General transfer comment: Mod A for lateral scoots along EOB. Demonstrated good use of BUE.  Ambulation/Gait                Stairs            Wheelchair Mobility    Modified Rankin (Stroke Patients Only)       Balance Overall balance assessment: Needs assistance Sitting-balance support: No upper  extremity supported;Feet supported Sitting balance-Leahy Scale: Fair                                       Pertinent Vitals/Pain Pain Assessment: No/denies pain Faces Pain Scale: No hurt    Home Living Family/patient expects to be discharged to:: Skilled nursing facility                      Prior Function Level of Independence: Needs assistance   Gait / Transfers Assistance Needed: Pt reports she uses a WC and requires assist for transfers.  ADL's / Homemaking Assistance Needed: Requires assist for ADLs.  Comments: Pt reports that staff provided total A with LB selfcare and toiletng, assist to sit EOB and 1 person assist to SPT to w/c; pt propels self in w/c and reports that she is OOB daily     Hand Dominance   Dominant Hand: Left    Extremity/Trunk Assessment   Upper Extremity Assessment Upper Extremity Assessment: Defer to OT evaluation RUE Deficits / Details: weakness from prior CVA, 3/5 grossly    Lower Extremity Assessment Lower Extremity Assessment: LLE deficits/detail LLE Deficits / Details: Reports LLE is weak at baseline secondary to CVA.    Cervical / Trunk Assessment Cervical / Trunk Assessment: Kyphotic  Communication   Communication: Expressive difficulties  Cognition Arousal/Alertness: Awake/alert Behavior During Therapy: WFL for tasks assessed/performed  Overall Cognitive Status: No family/caregiver present to determine baseline cognitive functioning                                 General Comments: Pt with dementia at baseline      General Comments General comments (skin integrity, edema, etc.): Pt reports being close to baseline    Exercises     Assessment/Plan    PT Assessment Patent does not need any further PT services;All further PT needs can be met in the next venue of care  PT Problem List Decreased strength;Decreased activity tolerance;Decreased coordination;Decreased mobility;Decreased  cognition;Decreased knowledge of use of DME;Decreased knowledge of precautions;Decreased balance       PT Treatment Interventions      PT Goals (Current goals can be found in the Care Plan section)  Acute Rehab PT Goals Patient Stated Goal: go back to nursing home PT Goal Formulation: With patient Time For Goal Achievement: 02/28/21 Potential to Achieve Goals: Fair    Frequency     Barriers to discharge        Co-evaluation               AM-PAC PT "6 Clicks" Mobility  Outcome Measure Help needed turning from your back to your side while in a flat bed without using bedrails?: A Little Help needed moving from lying on your back to sitting on the side of a flat bed without using bedrails?: A Little Help needed moving to and from a bed to a chair (including a wheelchair)?: A Lot Help needed standing up from a chair using your arms (e.g., wheelchair or bedside chair)?: A Lot Help needed to walk in hospital room?: A Lot Help needed climbing 3-5 steps with a railing? : Total 6 Click Score: 13    End of Session   Activity Tolerance: Patient tolerated treatment well Patient left: in bed;with call bell/phone within reach;with bed alarm set Nurse Communication: Mobility status PT Visit Diagnosis: Other symptoms and signs involving the nervous system (R29.898)    Time: 7737-3668 PT Time Calculation (min) (ACUTE ONLY): 15 min   Charges:   PT Evaluation $PT Eval Moderate Complexity: 1 Mod          Lou Miner, DPT  Acute Rehabilitation Services  Pager: [phone] Office: [phone]   Rudean Hitt 02/28/2021, 5:16 PM

## 2021-02-28 NOTE — Progress Notes (Signed)
Family Medicine Teaching Service Daily Progress Note Intern Pager: [phone]  Patient name: Sheila Flynn Medical record number: [id number] Date of birth: 01/17/1946 Age: 75 y.o. Gender: female  Primary Care Provider: Patient, No Pcp Per (Inactive) Consultants: Oncology Code Status: Full  Pt Overview and Major Events to Date:  4/18 admitted 4/23 expected discharge date  Assessment and Plan: Sheila Flynn is a 75 year old female who presented via EMS as a code stroke for right-sided weakness and facial drooping, found to be persistently hypoglycemic.  Symptoms improved with euvolemia. PMH is significant fordiastolic CHF, stroke, CAD, COPD, T2DM, GERD, rectal ca s/p colostomy, vascular dementia, depression.  Hypoglycemia T2DM Patient without complaints.  Eating well, no difficulty.  No nausea, vomiting, abdominal pain.  Glucose last 24 hours 106-176. -On consulted, recommends CT chest without contrast, MR abdomen with and without - Awaiting funguria panel - Continue hydrocortisone 3 times daily - MIVF with normal saline at 100 - Vitals pain routine - PT OT  Elevated Creatinine Glucosuria Creatinine continues to improve, 1.65 today.  Now below level of admission.  Baseline 1.3-1.45.  Currently on IV maintenance fluids.   - AM BMP - NS mIVF 100 mL/hr - hold losartan  Hypertension Patient with intermittent elevated systolic blood pressures up to 160s, 170s.  Diastolics remained 51V-61Y.  Currently on home Coreg.  No adjustments at this time. - continue to monitor  CHF  CAD HLD Home meds include atorvastatin 40 mg qHS, isosorbide 60 mg qAM, nitroglycerin sublingual PRN. -Continue home meds - Adjusted isosorbide to 20 BID 4/20  GERD Home meds of omeprazole 20 mg daily, zofran 4 mg PRN. -Continue home meds  Vascular dementia  Depression Insomnia At home takes trazodone 100 mg qHS, melatonin 10 mg qHS. -Continue home meds  Rectal cancer s/p  colostomy Normal colostomy care.  COPD Listed in chart, but no home meds for this. Last 24 hours,SpO2 >94% on room air.   Chronic constipation ContinueHome med of senokot qAM.   Low vitamin D ContinueHome med of cholecalciferol 5,000 units qAM   FEN/GI: Soft PPx: Lovenox   Status is: Inpatient  Remains inpatient appropriate because:Ongoing diagnostic testing needed not appropriate for outpatient work up and Inpatient level of care appropriate due to severity of illness   Dispo: The patient is from: SNF              Anticipated d/c is to: SNF              Patient currently is medically stable to d/c.   Difficult to place patient No  Subjective:  Patient sitting upright in bed, eating breakfast.  Appears comfortable.  No distress.  Has no complaints, denies any pain.  Denies nausea, vomiting, abdominal pain.  Flat affect per usual.  Objective: Temp:  [98.5 F (36.9 C)-98.9 F (37.2 C)] 98.7 F (37.1 C) (04/22 1700) Pulse Rate:  [62-81] 77 (04/22 1700) Resp:  [16-20] 16 (04/22 1700) BP: (120-172)/(41-61) 161/61 (04/22 1700) SpO2:  [95 %-97 %] 97 % (04/22 1700) Physical Exam: General: Awake, alert, responding appropriately to questions Cardiovascular: Regular rate and rhythm, no murmur Respiratory: CTA B Extremities: Moving all spontaneously, no focal deficits  Laboratory: Recent Labs  Lab 02/24/21 1118 02/24/21 1124 02/25/21 0327 02/26/21 0322 02/27/21 0152  WBC 8.6  --  5.2 4.8  --   HGB 12.5   < > 10.4* 9.5* 9.2*  HCT 41.6   < > 33.5* 31.6* 30.0*  PLT 273  --  214 204  --    < > =  values in this interval not displayed.   Recent Labs  Lab 02/24/21 1118 02/24/21 1124 02/25/21 0327 02/26/21 0322 02/27/21 0152 02/28/21 0233  NA 141   < > 138 140 140 138  K 4.0   < > 3.5 3.9 3.7 3.5  CL 103   < > 103 107 108 106  CO2 27  --  30 26 28 27   BUN 22   < > 16 19 18 17   CREATININE 1.76*   < > 1.79* 1.98* 1.75* 1.65*  CALCIUM 9.5  --  8.7* 8.6* 8.3*  8.2*  PROT 7.9  --  5.9*  --   --   --   BILITOT 0.4  --  0.6  --   --   --   ALKPHOS 61  --  47  --   --   --   ALT 19  --  14  --   --   --   AST 24  --  19  --   --   --   GLUCOSE 43*   < > 94 93 75 122*   < > = values in this interval not displayed.    Imaging/Diagnostic Tests: None last 24 hours.   Ezequiel Essex, MD 02/28/2021, 6:30 PM PGY-1, [place] Intern pager: [phone], text pages welcome

## 2021-02-28 NOTE — NC FL2 (Addendum)
[place] MEDICAID FL2 LEVEL OF CARE SCREENING TOOL     IDENTIFICATION  Patient Name: JENIFER STRUVE Birthdate: 20-Apr-1946 Sex: female Admission Date (Current Location): 02/24/2021  [facility] and [place] Number:  Herbalist and Address:  [facility] [place] [address] [place], [place] [address]      Provider Number: [id number]  Attending Physician Name and Address:  Dickie La, MD  Relative Name and Phone Number:  Jeral Pinch [phone]    Current Level of Care: Hospital Recommended Level of Care: [place] Prior Approval Number:    Date Approved/Denied:   PASRR Number: [id number]  Discharge Plan: SNF    Current Diagnoses: Patient Active Problem List   Diagnosis Date Noted  . Malnutrition of moderate degree 02/28/2021  . Renal cell carcinoma ([place])   . Weakness 02/24/2021  . Hypoglycemia 02/24/2021  . Seasonal allergic rhinitis 08/25/2016  . Status post colostomy ([place]) 01/03/2016  . Dyslipidemia associated with type 2 diabetes mellitus ([place]) 11/04/2015  . GERD (gastroesophageal reflux disease) 03/31/2014  . Depression due to dementia ([place]) 02/17/2014  . Chronic diastolic CHF (congestive heart failure) ([place])   . CAD (coronary artery disease)   . Type 2 diabetes mellitus with neurological complications ([place])   . COPD (chronic obstructive pulmonary disease) ([place])   . Vascular dementia with depressed mood ([place])   . 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 Thyroid goiter   . Chronic constipation   . ADENOCARCINOMA, RECTUM 08/09/2009  . Cerebral artery occlusion with cerebral infarction ([place]) 09/07/2007    Orientation RESPIRATION BLADDER Height & Weight     Self,Time,Situation,Place  Normal External catheter Weight: 67.4 kg Height:  5\' 7"  (170.2 cm)  BEHAVIORAL SYMPTOMS/MOOD NEUROLOGICAL BOWEL NUTRITION STATUS     (n/a) Colostomy Diet (soft)  AMBULATORY STATUS COMMUNICATION OF NEEDS Skin    Extensive Assist (wheelchair) Verbally (slurred speech difficult to understand) Normal                       Personal Care Assistance Level of Assistance  Bathing,Feeding,Dressing,Total care Bathing Assistance: Limited assistance Feeding assistance: Limited assistance Dressing Assistance: Limited assistance Total Care Assistance: Limited assistance   Functional Limitations Info  Sight,Hearing,Speech Sight Info: Adequate Hearing Info: Adequate Speech Info: Impaired    SPECIAL CARE FACTORS FREQUENCY  PT (By licensed PT),OT (By licensed OT)     PT Frequency: 5X OT Frequency: 5X            Contractures Contractures Info: Not present    Additional Factors Info  Code Status,Allergies,Psychotropic,Isolation Precautions,Suctioning Needs,Insulin Sliding Scale Code Status Info: Full Allergies Info: Ace inhibitors Psychotropic Info: see d/c summary Insulin Sliding Scale Info: see d/c summary for sliding scale information Isolation Precautions Info: none Suctioning Needs: n/a   Current Medications (02/28/2021):  This is the current hospital active medication list Current Facility-Administered Medications  Medication Dose Route Frequency Provider Last Rate Last Admin  . (feeding supplement) PROSource Plus liquid 30 mL  30 mL Oral BID BM Dickie La, MD   30 mL at 02/28/21 1534  . 0.9 %  sodium chloride infusion  250 mL Intravenous PRN Ezequiel Essex, MD      . 0.9 %  sodium chloride infusion   Intravenous Continuous Ezequiel Essex, MD 100 mL/hr at 02/28/21 0031 New Bag at 02/28/21 0031  . amLODipine (NORVASC) tablet 10 mg  10 mg Oral q morning Zola Button, MD   10  mg at 02/28/21 0827  . aspirin chewable tablet 81 mg  81 mg Oral q morning Zola Button, MD   81 mg at 02/28/21 0827  . atorvastatin (LIPITOR) tablet 40 mg  40 mg Oral QHS Zola Button, MD   40 mg at 02/27/21 2213  . carvedilol (COREG) tablet 6.25 mg  6.25 mg Oral BID Zola Button, MD   6.25 mg at 02/28/21 0820   . cholecalciferol (VITAMIN D3) tablet 5,000 Units  5,000 Units Oral q morning Zola Button, MD   5,000 Units at 02/28/21 [phone]  . enoxaparin (LOVENOX) injection 30 mg  30 mg Subcutaneous Q24H Ezequiel Essex, MD   30 mg at 02/26/21 2212  . feeding supplement (BOOST / RESOURCE BREEZE) liquid 1 Container  1 Container Oral TID BM Dickie La, MD   1 Container at 02/28/21 0831  . hydrALAZINE (APRESOLINE) tablet 25 mg  25 mg Oral BID WC Zola Button, MD   25 mg at 02/28/21 8546  . hydrocortisone (CORTEF) tablet 10 mg  10 mg Oral Q breakfast Ezequiel Essex, MD   10 mg at 02/28/21 2703   And  . hydrocortisone (CORTEF) tablet 5 mg  5 mg Oral Q lunch Ezequiel Essex, MD   5 mg at 02/28/21 1151   And  . hydrocortisone (CORTEF) tablet 5 mg  5 mg Oral Q supper Ezequiel Essex, MD   5 mg at 02/27/21 1613  . isosorbide mononitrate (ISMO) tablet 20 mg  20 mg Oral BID Lurline Del, DO   20 mg at 02/28/21 0820  . melatonin tablet 10 mg  10 mg Oral QHS Zola Button, MD   10 mg at 02/27/21 2213  . multivitamin with minerals tablet 1 tablet  1 tablet Oral Daily Dickie La, MD   1 tablet at 02/28/21 0820  . pantoprazole (PROTONIX) EC tablet 40 mg  40 mg Oral Daily Zola Button, MD   40 mg at 02/28/21 0820  . polyethylene glycol (MIRALAX / GLYCOLAX) packet 17 g  17 g Oral Daily Ezequiel Essex, MD   17 g at 02/28/21 [phone]  . polyvinyl alcohol (LIQUIFILM TEARS) 1.4 % ophthalmic solution 1 drop  1 drop Both Eyes Q8H PRN Zola Button, MD      . sodium chloride flush (NS) 0.9 % injection 3 mL  3 mL Intravenous Q12H Ezequiel Essex, MD   3 mL at 02/28/21 [phone]  . sodium chloride flush (NS) 0.9 % injection 3 mL  3 mL Intravenous PRN Ezequiel Essex, MD      . traZODone (DESYREL) tablet 100 mg  100 mg Oral QHS Zola Button, MD   100 mg at 02/27/21 2213     Discharge Medications: Please see discharge summary for a list of discharge medications.  Relevant Imaging Results:  Relevant Lab Results:   Additional  Information SS# [id number]  Angelita Ingles, RN

## 2021-02-28 NOTE — Progress Notes (Signed)
  Speech Language Pathology Treatment: Dysphagia  Patient Details Name: Sheila Flynn MRN: [id number] DOB: 02-28-1946 Today's Date: 02/28/2021 Time: 1230-1250 SLP Time Calculation (min) (ACUTE ONLY): 20 min  Assessment / Plan / Recommendation Clinical Impression  Pt seen for diet tolerance and training in basic swallow precautions/strategies during pm meal. Prior to PO consumption, pt frequently swallowed excessive thin secretions in oral cavity as observed during initial evaluation. Mechanical soft solids and thin liquids via straw self-fed with pt demonstrating x2 coughing, however continue to suspect impact of secretions as coughing is often noted right before solid bolus is placed in oral cavity by pt. Some impulsivity noted as indicated by large bites and gulping of thin liquids. Verbally cued pt minimally but consistently for slow rate, small bites/sips and swallow secretions prior to bites to alleviate any demands when masticating solids and decrease overall risk for aspiration. Pt to continue on current diet and will f/u for use of strategies during functional mealtimes if possible. Discussed concern for excessive thin secretions with RN as well as SLP treatment plan. RN expressed understanding and agreement with treatment plan and will address concern as noted above.   HPI HPI: 75 y.o. 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 According to facility staff, patient was sitting at a table playing Bingo when she suddenly slumped over, became  unresponsive, and started drooling. Her blood pressure was taken and she was found to be hypertensive with a systolic blood pressure greater than 200. Failed [facility] (02/24/21), then passed later that day per MD note. SLP screened (02/25/21) with RN reporting pt doing well with current diet and med intake. No needs identified at that time and SLP signed off. BSE reordered with MD noting "Patient had episode of choking on food. RN noted patient had difficulty clearing food in posterior orpharynx" (02/26/21). Previous MBS (2018), performed after pt had choking incident at SNF, revealed normal orpharyngeal swallow with recommendation of regular diet/thin liquids.      SLP Plan  Continue with current plan of care       Recommendations  Diet recommendations: Regular ;Thin liquid  Liquids provided via: Cup;Straw Medication Administration: Whole meds with liquid Supervision: Patient able to self feed;Intermittent supervision to cue for compensatory strategies Compensations: Slow rate;Small sips/bites Postural Changes and/or Swallow Maneuvers: Seated upright 90 degrees                Oral Care Recommendations: Oral care BID;Staff/trained caregiver to provide oral care Follow up Recommendations: Other (comment) (TBD) SLP Visit Diagnosis: Dysphagia, unspecified (R13.10) Plan: Continue with current plan of care       [place], [place], [place] Office Number: [phone]   Acie Fredrickson 02/28/2021, 1:01 PM

## 2021-02-28 NOTE — TOC Progression Note (Addendum)
Transition of Care [facility]) - Progression Note    Patient Details  Name: Sheila Flynn MRN: [id number] Date of Birth: [birth year]  Transition of Care [facility]) CM/SW Contact  Angelita Ingles, RN Phone Number: [phone]  02/28/2021, 2:55 PM  Clinical Narrative:    CM received call from MD to question if patient is able to return to facility today or tomorrow. CM has reached out to [place] which is the facility that patient came from. Message has been left to inquire if patient is able to return. CM will initiate updating FL2.  1530 FL2 updated. CM received call from [facility] at [place]. Patient is ok to return over the weekend. Covid test is not needed. MD has been made aware.        Expected Discharge Plan and Services                                                 Social Determinants of Health (SDOH) Interventions    Readmission Risk Interventions No flowsheet data found.

## 2021-02-28 NOTE — Care Management Important Message (Signed)
Important Message  Patient Details  Name: Sheila Flynn MRN: [id number] Date of Birth: 27-Jul-1946   Medicare Important Message Given:  Yes     Orbie Pyo 02/28/2021, 2:09 PM

## 2021-03-01 ENCOUNTER — Inpatient Hospital Stay (HOSPITAL_COMMUNITY): Payer: Medicare (Managed Care)

## 2021-03-01 DIAGNOSIS — E162 Hypoglycemia, unspecified: Secondary | ICD-10-CM | POA: Diagnosis not present

## 2021-03-01 DIAGNOSIS — E1149 Type 2 diabetes mellitus with other diabetic neurological complication: Secondary | ICD-10-CM | POA: Diagnosis not present

## 2021-03-01 DIAGNOSIS — R531 Weakness: Secondary | ICD-10-CM | POA: Diagnosis not present

## 2021-03-01 DIAGNOSIS — R4182 Altered mental status, unspecified: Secondary | ICD-10-CM | POA: Diagnosis not present

## 2021-03-01 LAB — BASIC METABOLIC PANEL
Anion gap: 6 (ref 5–15)
BUN: 17 mg/dL (ref 8–23)
CO2: 28 mmol/L (ref 22–32)
Calcium: 8.6 mg/dL — ABNORMAL LOW (ref 8.9–10.3)
Chloride: 106 mmol/L (ref 98–111)
Creatinine, Ser: 1.73 mg/dL — ABNORMAL HIGH (ref 0.44–1.00)
GFR, Estimated: 31 mL/min — ABNORMAL LOW (ref >60)
Glucose, Bld: 68 mg/dL — ABNORMAL LOW (ref 70–99)
Potassium: 3.4 mmol/L — ABNORMAL LOW (ref 3.5–5.1)
Sodium: 140 mmol/L (ref 135–145)

## 2021-03-01 LAB — GLUCOSE, CAPILLARY: Glucose-Capillary: 81 mg/dL (ref 70–99)

## 2021-03-01 MED ORDER — GADOBUTROL 1 MMOL/ML IV SOLN
6.5000 mL | Freq: Once | INTRAVENOUS | Status: AC | PRN
  Administered 2021-03-01: 6.5 mL via INTRAVENOUS

## 2021-03-01 MED ORDER — DEXTROSE 50 % IV SOLN: 25.0000 mL | Freq: Once | INTRAVENOUS | Status: DC

## 2021-03-01 NOTE — Progress Notes (Signed)
Called report to [place] at [place].

## 2021-03-01 NOTE — Progress Notes (Signed)
Patient belongings gathered and transported to [place] via [place].

## 2021-03-01 NOTE — TOC Transition Note (Signed)
Transition of Care [facility]) - CM/SW Discharge Note   Patient Details  Name: Sheila Flynn MRN: [id number] Date of Birth: Feb 17, 1946  Transition of Care [facility]) CM/SW Contact:  Coralee Pesa, [place] Phone Number: 03/01/2021, 10:30 AM   Clinical Narrative:    Pt to be transported to [facility] via [place], transport set for 11 am. Nurse to call report to [phone]. Ask for charge nurse on [employer]. Rm. 135, bed 1   Final next level of care: Skilled Nursing Facility Barriers to Discharge: Barriers Resolved   Patient Goals and CMS Choice        Discharge Placement              Patient chooses bed at:  ([facility]) Patient to be transferred to facility by: [place] Name of family member notified: Patient Patient and family notified of of transfer: 03/01/21  Discharge Plan and Services                                     Social Determinants of Health (SDOH) Interventions     Readmission Risk Interventions No flowsheet data found.

## 2021-03-01 NOTE — Progress Notes (Signed)
Attempted to call report to [phone].  Left message.

## 2021-03-03 LAB — SULFONYLUREA HYPOGLYCEMICS PANEL, SERUM
Acetohexamide: NEGATIVE ug/mL (ref 20–60)
Chlorpropamide: NEGATIVE ug/mL (ref 75–250)
Glimepiride: NEGATIVE ng/mL (ref 80–250)
Glipizide: 163 ng/mL — ABNORMAL LOW (ref 200–1000)
Glyburide: NEGATIVE ng/mL
Nateglinide: NEGATIVE ng/mL
Repaglinide: NEGATIVE ng/mL
Tolazamide: NEGATIVE ug/mL
Tolbutamide: NEGATIVE ug/mL (ref 40–100)

## 2021-10-23 ENCOUNTER — Other Ambulatory Visit: Payer: Self-pay

## 2021-10-23 DIAGNOSIS — M7989 Other specified soft tissue disorders: Secondary | ICD-10-CM

## 2021-10-23 NOTE — Addendum Note (Signed)
Addended byDoylene Bode on: 10/23/2021 09:50 PM   Modules accepted: Orders

## 2021-10-24 ENCOUNTER — Other Ambulatory Visit: Payer: Self-pay

## 2021-10-24 ENCOUNTER — Other Ambulatory Visit: Payer: Self-pay | Admitting: *Deleted

## 2021-10-24 DIAGNOSIS — M7989 Other specified soft tissue disorders: Secondary | ICD-10-CM

## 2021-11-06 NOTE — Progress Notes (Deleted)
Office Note     CC: Right upper extremity superficial venous thrombosis Requesting Provider:  Caprice Renshaw, MD  HPI: Sheila Flynn is a 75 y.o. (1946/07/18) female who presents at the request of Patient, No Pcp Per (Inactive) for evaluation of ***.   Venous symptoms include: positive if (X) [  ] aching [  ] heavy [  ] tired  [  ] throbbing [  ] burning  [  ] itching [  ]swelling [  ] bleeding [  ] ulcer  Onset/duration:  ***  Occupation:  *** Aggravating factors: (sitting, standing) Alleviating factors: (elevation) Compression:  *** Helps:  *** Pain medications:  *** Previous vein procedures:  *** History of DVT:  ***   The pt *** on a statin for cholesterol management.  The pt *** on a daily aspirin.   Other AC:  *** The pt *** on *** for hypertension.   The pt *** diabetic.  *** Tobacco hx:  ***  Past Medical History:  Diagnosis Date   Acute respiratory failure ([place]) 12/21/10   Aspiration pneumonia (HCC)    CAD (coronary artery disease)    Dr. 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 ([place])    Migraine    "monthly" (11/06/2014)   Myocardial infarction ([place])    "I've had about 3" (11/06/2014)   On home oxygen therapy    "prn at the nursing home" (11/06/2014)   Pulmonary hypertension ([place])    Rectal cancer [facility])    s/p colostomy   Sacral fracture, closed ([place])    s/p kyphoplasty   Stroke ([place])  2011   postop   Stroke [facility]) ?2014   "left side sometimes weaker since" (11/06/2014)   Thyroid goiter    Vascular dementia with depressed mood ([place])     Past Surgical History:  Procedure Laterality Date   ABDOMINAL HYSTERECTOMY     APPENDECTOMY     COLON SURGERY     COLOSTOMY     rectal ca   KYPHOPLASTY     sacrum   right cataract  11-19-2015   TONSILLECTOMY     TUBAL LIGATION      Social History   Socioeconomic History   Marital status: Widowed    Spouse name: Not on file   Number of children: Not on file   Years of education: Not on file   Highest education level: Not on file  Occupational History    Employer: UNEMPLOYED    Comment: custodial  Tobacco Use   Smoking status: Former    Packs/day: 0.50    Years: 2.00    Pack years: 1.00    Types: Cigarettes   Smokeless tobacco: Never   Tobacco comments:    "quit smoking cigarettes in 1969"  Substance and Sexual Activity   Alcohol use: No   Drug use: Yes    Comment: previously smoked crack, has had positive UDS for amphetamines  and narcotics   Sexual activity: Not Currently  Other Topics Concern   Not on file  Social History Narrative   Born in [place], has some high school education, retired from Sports coach job, divorced, has children   Social Determinants of Radio broadcast assistant Strain: Not on [employer] Insecurity: Not on file  Transportation Needs: Not on file  Physical Activity: Not on file  Stress: Not on file  Social Connections: Not on file  Intimate Partner Violence: Not on file   ***No family history on file.  Current Outpatient Medications  Medication Sig Dispense Refill   ACETAMINOPHEN PO Take 2 tablets by mouth every 6 (six) hours as needed (pain).     amLODipine (NORVASC) 10 MG tablet Take 10 mg by mouth every morning.     Artificial Tear Solution (TEARS PURE) 0.1-0.3 % SOLN Place 1 drop into both eyes every 8 (eight) hours as needed (dry eyes).     aspirin 81 MG EC tablet  Take 81 mg by mouth every morning. Swallow whole.     atorvastatin (LIPITOR) 40 MG tablet Take 40 mg by mouth at bedtime.     carvedilol (COREG) 6.25 MG tablet Take 6.25 mg by mouth 2 (two) times daily.     Cholecalciferol (VITAMIN D-3) 125 MCG (5000 UT) TABS Take 5,000 Units by mouth every morning.     hydrALAZINE (APRESOLINE) 25 MG tablet Take 25 mg by mouth 2 (two) times daily with a meal.     hydrocortisone (CORTEF) 10 MG tablet Take 1 tablet (10 mg total) by mouth daily with breakfast.     hydrocortisone (CORTEF) 5 MG tablet Take 1 tablet (5 mg total) by mouth daily with lunch.     hydrocortisone (CORTEF) 5 MG tablet Take 1 tablet (5 mg total) by mouth daily with supper.     isosorbide mononitrate (ISMO) 20 MG tablet Take 1 tablet (20 mg total) by mouth 2 (two) times daily at 10 AM and 5 PM.     losartan (COZAAR) 50 MG tablet Take 50 mg by mouth 2 (two) times daily with a meal.     Melatonin 10 MG TABS Take 10 mg by mouth at bedtime.     Multiple Vitamin (MULTIVITAMIN WITH MINERALS) TABS tablet Take 1 tablet by mouth daily.     nitroGLYCERIN (NITROSTAT) 0.4 MG SL tablet Place 0.4 mg under the tongue every 5 (five) minutes as needed for chest pain.     Nutritional Supplements (,FEEDING SUPPLEMENT, PROSOURCE PLUS) liquid Take 30 mLs by mouth 2 (two) times daily between meals.     omeprazole (PRILOSEC) 20 MG capsule Take 20 mg by mouth daily at 6 (six) AM.     ondansetron (ZOFRAN) 4 MG tablet Take 4 mg by mouth every 8 (eight) hours as needed for nausea or vomiting.     oxyCODONE (OXY IR/ROXICODONE) 5 MG immediate release tablet Take 5 mg by mouth every 12 (twelve) hours as needed (pain).     polyethylene glycol (MIRALAX / GLYCOLAX) 17 g packet Take 17 g by mouth daily. 14 each 0   sennosides-docusate sodium (SENOKOT-S) 8.6-50 MG tablet Take 1 tablet by mouth every morning.     traZODone (DESYREL) 100 MG tablet Take 100 mg by mouth at bedtime.     No current facility-administered medications  for this visit.    Allergies  Allergen Reactions   Ace Inhibitors Swelling     REVIEW OF SYSTEMS:  *** [X]  denotes positive finding, [ ]   denotes negative finding Cardiac  Comments:  Chest pain or chest pressure:    Shortness of breath upon exertion:    Short of breath when lying flat:    Irregular heart rhythm:        Vascular    Pain in calf, thigh, or hip brought on by ambulation:    Pain in feet at night that wakes you up from your sleep:     Blood clot in your veins:    Leg swelling:         Pulmonary    Oxygen at home:    Productive cough:     Wheezing:         Neurologic    Sudden weakness in arms or legs:     Sudden numbness in arms or legs:     Sudden onset of difficulty speaking or slurred speech:    Temporary loss of vision in one eye:     Problems with dizziness:         Gastrointestinal    Blood in stool:     Vomited blood:         Genitourinary    Burning when urinating:     Blood in urine:        Psychiatric    Major depression:         Hematologic    Bleeding problems:    Problems with blood clotting too easily:        Skin    Rashes or ulcers:        Constitutional    Fever or chills:      PHYSICAL EXAMINATION:  There were no vitals filed for this visit.  General:  WDWN in NAD; vital signs documented above Gait: Not observed HENT: WNL, normocephalic Pulmonary: normal non-labored breathing , without Rales, rhonchi,  wheezing Cardiac: {Desc; regular/irreg:14544} HR, without  Murmurs {With/Without:20273} carotid bruit*** Abdomen: soft, NT, no masses Skin: {With/Without:20273} rashes Vascular Exam/Pulses:  Right Left  Radial {Exam; arterial pulse strength 0-4:30167} {Exam; arterial pulse strength 0-4:30167}  Ulnar {Exam; arterial pulse strength 0-4:30167} {Exam; arterial pulse strength 0-4:30167}  Femoral {Exam; arterial pulse strength 0-4:30167} {Exam; arterial pulse strength 0-4:30167}  Popliteal {Exam; arterial pulse strength  0-4:30167} {Exam; arterial pulse strength 0-4:30167}  DP {Exam; arterial pulse strength 0-4:30167} {Exam; arterial pulse strength 0-4:30167}  PT {Exam; arterial pulse strength 0-4:30167} {Exam; arterial pulse strength 0-4:30167}   Extremities: {With/Without:20273} ischemic changes, {With/Without:20273} Gangrene , {With/Without:20273} cellulitis; {With/Without:20273} open wounds;  Musculoskeletal: no muscle wasting or atrophy  Neurologic: A&O X 3;  No focal weakness or paresthesias are detected Psychiatric:  The pt has {Desc; normal/abnormal:11317::"Normal"} affect.   Non-Invasive Vascular Imaging:   ***    ASSESSMENT/PLAN:: 75 y.o. female presenting with ***   ***   Broadus John, MD [facility] [phone]

## 2021-11-07 ENCOUNTER — Encounter: Payer: Medicare Other | Admitting: Vascular Surgery

## 2021-11-07 ENCOUNTER — Ambulatory Visit (HOSPITAL_COMMUNITY): Payer: Medicare Other | Attending: Vascular Surgery

## 2022-01-14 ENCOUNTER — Emergency Department (HOSPITAL_COMMUNITY)
Admission: EM | Admit: 2022-01-14 | Discharge: 2022-01-14 | Disposition: A | Discharge status: Skilled Nursing Facility | Payer: Medicare Other | Attending: Emergency Medicine | Admitting: Emergency Medicine

## 2022-01-14 ENCOUNTER — Encounter (HOSPITAL_COMMUNITY): Payer: Self-pay

## 2022-01-14 ENCOUNTER — Other Ambulatory Visit: Payer: Self-pay

## 2022-01-14 ENCOUNTER — Emergency Department (HOSPITAL_COMMUNITY): Payer: Medicare Other

## 2022-01-14 DIAGNOSIS — R29810 Facial weakness: Secondary | ICD-10-CM | POA: Insufficient documentation

## 2022-01-14 DIAGNOSIS — R404 Transient alteration of awareness: Secondary | ICD-10-CM | POA: Diagnosis not present

## 2022-01-14 DIAGNOSIS — R479 Unspecified speech disturbances: Secondary | ICD-10-CM | POA: Diagnosis not present

## 2022-01-14 DIAGNOSIS — Z7982 Long term (current) use of aspirin: Secondary | ICD-10-CM | POA: Diagnosis not present

## 2022-01-14 DIAGNOSIS — Z7901 Long term (current) use of anticoagulants: Secondary | ICD-10-CM | POA: Insufficient documentation

## 2022-01-14 DIAGNOSIS — R519 Headache, unspecified: Secondary | ICD-10-CM | POA: Diagnosis not present

## 2022-01-14 DIAGNOSIS — R4182 Altered mental status, unspecified: Secondary | ICD-10-CM | POA: Diagnosis present

## 2022-01-14 LAB — CBC WITH DIFFERENTIAL/PLATELET
Abs Immature Granulocytes: 0.03 10*3/uL (ref 0.00–0.07)
Basophils Absolute: 0 10*3/uL (ref 0.0–0.1)
Basophils Relative: 1 %
Eosinophils Absolute: 0.2 10*3/uL (ref 0.0–0.5)
Eosinophils Relative: 2 %
HCT: 36.7 % (ref 36.0–46.0)
Hemoglobin: 11.1 g/dL — ABNORMAL LOW (ref 12.0–15.0)
Immature Granulocytes: 0 %
Lymphocytes Relative: 19 %
Lymphs Abs: 1.6 10*3/uL (ref 0.7–4.0)
MCH: 25.3 pg — ABNORMAL LOW (ref 26.0–34.0)
MCHC: 30.2 g/dL (ref 30.0–36.0)
MCV: 83.8 fL (ref 80.0–100.0)
Monocytes Absolute: 0.5 10*3/uL (ref 0.1–1.0)
Monocytes Relative: 6 %
Neutro Abs: 6.1 10*3/uL (ref 1.7–7.7)
Neutrophils Relative %: 72 %
Platelets: 276 10*3/uL (ref 150–400)
RBC: 4.38 MIL/uL (ref 3.87–5.11)
RDW: 15.9 % — ABNORMAL HIGH (ref 11.5–15.5)
WBC: 8.5 10*3/uL (ref 4.0–10.5)
nRBC: 0 % (ref 0.0–0.2)

## 2022-01-14 LAB — COMPREHENSIVE METABOLIC PANEL
ALT: 13 U/L (ref 0–44)
AST: 17 U/L (ref 15–41)
Albumin: 3.5 g/dL (ref 3.5–5.0)
Alkaline Phosphatase: 64 U/L (ref 38–126)
Anion gap: 7 (ref 5–15)
BUN: 21 mg/dL (ref 8–23)
CO2: 26 mmol/L (ref 22–32)
Calcium: 8.7 mg/dL — ABNORMAL LOW (ref 8.9–10.3)
Chloride: 106 mmol/L (ref 98–111)
Creatinine, Ser: 1.89 mg/dL — ABNORMAL HIGH (ref 0.44–1.00)
GFR, Estimated: 28 mL/min — ABNORMAL LOW (ref >60)
Glucose, Bld: 101 mg/dL — ABNORMAL HIGH (ref 70–99)
Potassium: 4.1 mmol/L (ref 3.5–5.1)
Sodium: 139 mmol/L (ref 135–145)
Total Bilirubin: 0.3 mg/dL (ref 0.3–1.2)
Total Protein: 7.4 g/dL (ref 6.5–8.1)

## 2022-01-14 LAB — URINALYSIS, ROUTINE W REFLEX MICROSCOPIC
Bilirubin Urine: NEGATIVE
Glucose, UA: NEGATIVE mg/dL
Hgb urine dipstick: NEGATIVE
Ketones, ur: NEGATIVE mg/dL
Leukocytes,Ua: NEGATIVE
Nitrite: NEGATIVE
Protein, ur: NEGATIVE mg/dL
Specific Gravity, Urine: 1.011 (ref 1.005–1.030)
pH: 7 (ref 5.0–8.0)

## 2022-01-14 LAB — AMMONIA: Ammonia: 22 umol/L (ref 9–35)

## 2022-01-14 LAB — ETHANOL: Alcohol, Ethyl (B): <10 mg/dL (ref <10)

## 2022-01-14 LAB — LACTIC ACID, PLASMA: Lactic Acid, Venous: 1.3 mmol/L (ref 0.5–1.9)

## 2022-01-14 LAB — CBG MONITORING, ED: Glucose-Capillary: 96 mg/dL (ref 70–99)

## 2022-01-14 NOTE — ED Notes (Signed)
RN and NT attempted in/out cath. Unsuccessful d/t pt unable to spread legs further and pt started urinating. NT and RN changed pt brief and provided pericare ?

## 2022-01-14 NOTE — ED Notes (Signed)
PTAR on unit to transfer pt back to facility.  ?

## 2022-01-14 NOTE — ED Notes (Signed)
This RN attempted to call [facility] at [phone] for report, no answer.  ?

## 2022-01-14 NOTE — ED Provider Notes (Signed)
[facility] EMERGENCY DEPARTMENT Provider Note   CSN: [id number] Arrival date & time: 01/14/22  1134     History  Chief Complaint  Patient presents with   Altered Mental Status    JAEMARIE HOCHBERG is a 76 y.o. female.  76 yo F with a chief complaint of change in her mental status.  Was noticed by her nursing facility.  Felt she was having some difficulty speaking and was complaining of a headache.  With EMS arrival they feel like she is significantly improved.  Has a history of a stroke is bedbound and has some residual weakness.   Altered Mental Status     Home Medications Prior to Admission medications   Medication Sig Start Date End Date Taking? Authorizing Provider  acetaminophen (TYLENOL) 325 MG tablet Take 650 mg by mouth every 6 (six) hours as needed for mild pain.   Yes [provider]  amLODipine (NORVASC) 10 MG tablet Take 10 mg by mouth daily.   Yes [provider]  apixaban (ELIQUIS) 5 MG TABS tablet Take 5 mg by mouth 2 (two) times daily.   Yes [provider]  aspirin 81 MG EC tablet Take 81 mg by mouth daily. Swallow whole.   Yes [provider]  atorvastatin (LIPITOR) 40 MG tablet Take 40 mg by mouth at bedtime.   Yes [provider]  carvedilol (COREG) 6.25 MG tablet Take 6.25 mg by mouth 2 (two) times daily.   Yes [provider]  Cholecalciferol (VITAMIN D-3) 125 MCG (5000 UT) TABS Take 5,000 Units by mouth every morning.   Yes [provider]  eszopiclone (LUNESTA) 2 MG TABS tablet Take 2 mg by mouth at bedtime. Take immediately before bedtime   Yes [provider]  hydrALAZINE (APRESOLINE) 25 MG tablet Take 25 mg by mouth in the morning and at bedtime.   Yes [provider]  hydrocortisone (CORTEF) 10 MG tablet Take 1 tablet (10 mg total) by mouth daily with breakfast. Patient taking differently: Take 10 mg by mouth daily. Also takes '5mg'$  dose in the morning  03/01/21  Yes Ezequiel Essex, MD  hydrocortisone (CORTEF) 5 MG tablet Take 1 tablet (5 mg total) by mouth daily with lunch. Patient taking differently: Take 5 mg by mouth every evening. Also takes '10mg'$  dose in the morning 03/01/21  Yes Ezequiel Essex, MD  isosorbide mononitrate (ISMO) 20 MG tablet Take 1 tablet (20 mg total) by mouth 2 (two) times daily at 10 AM and 5 PM. 03/01/21  Yes Ezequiel Essex, MD  losartan (COZAAR) 50 MG tablet Take 50 mg by mouth 2 (two) times daily with a meal.   Yes [provider]  Melatonin 10 MG TABS Take 10 mg by mouth at bedtime.   Yes [provider]  mirtazapine (REMERON) 7.5 MG tablet Take 7.5 mg by mouth at bedtime.   Yes [provider]  Multiple Vitamin (MULTIVITAMIN WITH MINERALS) TABS tablet Take 1 tablet by mouth daily. 03/01/21  Yes Ezequiel Essex, MD  nitroGLYCERIN (NITROSTAT) 0.4 MG SL tablet Place 0.4 mg under the tongue every 5 (five) minutes x 3 doses as needed for chest pain.   Yes [provider]  omeprazole (PRILOSEC) 20 MG capsule Take 20 mg by mouth daily at 6 (six) AM.   Yes [provider]  ondansetron (ZOFRAN) 4 MG tablet Take 4 mg by mouth every 8 (eight) hours as needed for nausea.   Yes [provider]  oxyCODONE (  OXY IR/ROXICODONE) 5 MG immediate release tablet Take 5 mg by mouth every 12 (twelve) hours as needed for moderate pain or severe pain.   Yes [provider]  Polyethyl Glycol-Propyl Glycol (SYSTANE) 0.4-0.3 % SOLN Place 1 drop into both eyes daily.   Yes [provider]  polyethylene glycol (MIRALAX / GLYCOLAX) 17 g packet Take 17 g by mouth daily. 03/01/21  Yes Ezequiel Essex, MD  sennosides-docusate sodium (SENOKOT-S) 8.6-50 MG tablet Take 1 tablet by mouth daily.   Yes [provider]  Artificial Tear Solution (TEARS PURE) 0.1-0.3 % SOLN Place 1 drop into both eyes every 8 (eight) hours as needed (dry eyes).    [provider]  Nutritional  Supplements (,FEEDING SUPPLEMENT, PROSOURCE PLUS) liquid Take 30 mLs by mouth 2 (two) times daily between meals. 03/01/21   Ezequiel Essex, MD      Allergies    Ace inhibitors    Review of Systems   Review of Systems  Physical Exam Updated Vital Signs BP 139/67    Pulse 69    Temp 97.9 F (36.6 C) (Oral)    Resp 15    Ht '5\' 7"'$  (1.702 m)    Wt 70 kg    SpO2 94%    BMI 24.17 kg/m  Physical Exam Vitals and nursing note reviewed.  Constitutional:      General: She is not in acute distress.    Appearance: She is well-developed. She is not diaphoretic.  HENT:     Head: Normocephalic and atraumatic.  Eyes:     Pupils: Pupils are equal, round, and reactive to light.  Cardiovascular:     Rate and Rhythm: Normal rate and regular rhythm.     Heart sounds: No murmur heard.   No friction rub. No gallop.  Pulmonary:     Effort: Pulmonary effort is normal.     Breath sounds: No wheezing or rales.  Abdominal:     General: There is no distension.     Palpations: Abdomen is soft.     Tenderness: There is no abdominal tenderness.  Musculoskeletal:        General: No tenderness.     Cervical back: Normal range of motion and neck supple.  Skin:    General: Skin is warm and dry.  Neurological:     Mental Status: She is alert and oriented to person, place, and time.     Comments: Right-sided facial droop with left-sided weakness.  Extraocular motion intact.  Finger-to-nose mildly limited by left upper extremity weakness.  Psychiatric:        Behavior: Behavior normal.    ED Results / Procedures / Treatments   Labs (all labs ordered are listed, but only abnormal results are displayed) Labs Reviewed  COMPREHENSIVE METABOLIC PANEL - Abnormal; Notable for the following components:      Result Value   Glucose, Bld 101 (*)    Creatinine, Ser 1.89 (*)    Calcium 8.7 (*)    GFR, Estimated 28 (*)    All other components within normal limits  CBC WITH DIFFERENTIAL/PLATELET - Abnormal; Notable  for the following components:   Hemoglobin 11.1 (*)    MCH 25.3 (*)    RDW 15.9 (*)    All other components within normal limits  AMMONIA  LACTIC ACID, PLASMA  ETHANOL  URINALYSIS, ROUTINE W REFLEX MICROSCOPIC  CBG MONITORING, ED    EKG EKG Interpretation  Date/Time:  Wednesday January 14 2022 11:41:14 EST Ventricular Rate:  71 PR Interval:  197 QRS Duration: 159 QT Interval:  484 QTC Calculation: 527 R Axis:   82 Text Interpretation: Sinus rhythm Ventricular premature complex IVCD, consider atypical LBBB No significant change since last tracing Confirmed by Deno Etienne [phone]) on 01/14/2022 11:52:49 AM  Radiology CT HEAD WO CONTRAST  Result Date: 01/14/2022 CLINICAL DATA:  Altered mental status, headache EXAM: CT HEAD WITHOUT CONTRAST TECHNIQUE: Contiguous axial images were obtained from the base of the skull through the vertex without intravenous contrast. RADIATION DOSE REDUCTION: This exam was performed according to the departmental dose-optimization program which includes automated exposure control, adjustment of the mA and/or kV according to patient size and/or use of iterative reconstruction technique. COMPARISON:  02/24/2021 FINDINGS: Brain: Generalized atrophy. Normal ventricular morphology. No midline shift or mass effect. Small vessel chronic ischemic changes of deep cerebral white matter. Old lacunar infarcts at the basal ganglia bilaterally. Old infarct LEFT cerebellar hemisphere. No intracranial hemorrhage, mass lesion, or evidence of acute infarction. No extra-axial fluid collection. Vascular: Atherosclerotic calcifications of internal carotid arteries at skull base. No hyperdense vessels. Skull: Intact Sinuses/Orbits: Clear Other: N/A IMPRESSION: Atrophy with small vessel chronic ischemic changes of deep cerebral white matter. Old lacunar infarcts at the basal ganglia bilaterally and in LEFT cerebellar hemisphere. No acute intracranial abnormalities. Electronically Signed   By:  Lavonia Dana M.D.   On: 01/14/2022 12:40   DG Chest Port 1 View  Result Date: 01/14/2022 CLINICAL DATA:  Altered mental status EXAM: PORTABLE CHEST 1 VIEW COMPARISON:  Previous studies including the examination of 02/24/2021 FINDINGS: Transverse diameter of heart is increased. There are no signs of pulmonary edema or focal pulmonary consolidation. There is no pleural effusion or pneumothorax. Prominence of right paratracheal soft tissues as not changed. 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 IMPRESSION: Cardiomegaly. There are no new infiltrates or signs of pulmonary edema. Electronically Signed   By: Elmer Picker M.D.   On: 01/14/2022 12:05    Procedures Procedures    Medications Ordered in ED Medications - No data to display  ED Course/ Medical Decision Making/ A&P                           Medical Decision Making Amount and/or Complexity of Data Reviewed Labs: ordered. Radiology: ordered.   76 yo F with a significant past medical history of a prior stroke comes in with a chief complaints of change in cognition and headache.  This was reported by the skilled nursing facility.  The patient reportedly preferred not to come to the hospital for evaluation.  Her speech is somewhat limited on my exam and was noted previously in a note that she had some difficulty speaking at baseline.  She does have left sided weakness for me, last emergency department note that I can see in the system documents right-sided weakness.  Not sure if this is new or not though I feel that the patient's likely would not have much benefit for IV thrombolysis with profound limitations of her ADLs at baseline.  On the nursing documentation from her facility it does document left-sided weakness as well.  Interestingly the patient also was noted to be hypotensive with EMS on initial blood pressure though had resolved by the time she arrived.  We will  obtain a laboratory evaluation and CT of the head.  Reassess.  No change in renal function pneumonia levels normal no acidosis  no electrolyte abnormality no significant anemia.  Chest x-ray independently interpreted by me without focal infiltrate.  CT scan of the head was negative for acute pathology.  Awaiting UA.  If negative or positive likely will send home plus or minus antibiotics.  Signed out to Dr. Pearline Cables, please see her note for further details of care.         Final Clinical Impression(s) / ED Diagnoses Final diagnoses:  Transient alteration of awareness    Rx / DC Orders ED Discharge Orders     None         Deno Etienne, DO 01/14/22 1517

## 2022-01-14 NOTE — ED Triage Notes (Signed)
Pt bib ems from [facility] c/o AMS. Staff noticed pt wouldn't respond to speech when she normally is able to speak. En route ems stated pt is appropriate at baseline and following commands. Pt has hx of CVA. Pt usually needs one assist to transfer to chair or bed but today she seemed more weak and need 2 person assist.  ? ? ?CBG 138 ?Initial BP 80/50 >>> 135/65 ?HR 52  ?

## 2022-01-14 NOTE — ED Notes (Signed)
This RN attempted to call [facility] x 2, no answer.  ?

## 2022-01-14 NOTE — ED Notes (Signed)
Pt was given two orange juice cups and a cup of water to help with urine output.  ?

## 2022-01-14 NOTE — ED Provider Notes (Signed)
?  3:11 PM ?Patient signed out to me by previous ED physician. Pt is a 76 yo female presenting from SNF for AMS with confusion. Bed bound at baseline secondary to previous CVA.  ? ?No pertinent exam findings. No new focal deficits. ? ?[place] demonstrates no acute process ?No s/s sepsis  ?No anemia ?Stable ECG and electrolytes. ?Stable cardiac workup ?UA pending ? ?Plan: DC after UA ? ?Physical Exam  ?BP 139/67   Pulse 69   Temp 97.9 ?F (36.6 ?C) (Oral)   Resp 15   Ht '5\' 7"'$  (1.702 m)   Wt 70 kg   SpO2 94%   BMI 24.17 kg/m?  ? ?Physical Exam ?Vitals and nursing note reviewed.  ?Constitutional:   ?   General: She is not in acute distress. ?   Appearance: She is well-developed.  ?HENT:  ?   Head: Normocephalic and atraumatic.  ?Eyes:  ?   Conjunctiva/sclera: Conjunctivae normal.  ?Cardiovascular:  ?   Rate and Rhythm: Normal rate and regular rhythm.  ?   Heart sounds: No murmur heard. ?Pulmonary:  ?   Effort: Pulmonary effort is normal. No respiratory distress.  ?   Breath sounds: Normal breath sounds.  ?Abdominal:  ?   Palpations: Abdomen is soft.  ?   Tenderness: There is no abdominal tenderness.  ?Musculoskeletal:     ?   General: No swelling.  ?   Cervical back: Neck supple.  ?Skin: ?   General: Skin is warm and dry.  ?   Capillary Refill: Capillary refill takes less than 2 seconds.  ?Neurological:  ?   Mental Status: She is alert.  ?Psychiatric:     ?   Mood and Affect: Mood normal.  ? ? ?Procedures  ?Procedures ? ?ED Course / MDM  ?  ?Medical Decision Making ?Amount and/or Complexity of Data Reviewed ?Labs: ordered. ?Radiology: ordered. ? ? ?UA demonstrates no UTI.  ? ?Patient in no distress and overall condition improved here in the ED. Detailed discussions were had with the patient regarding current findings, and need for close f/u with PCP or on call doctor. The patient has been instructed to return immediately if the symptoms worsen in any way for re-evaluation. Patient verbalized understanding and is in  agreement with current care plan. All questions answered prior to discharge. ? ? ? ? ? ?  ?Lianne Cure, DO ?29/24/46 1053 ? ?

## 2022-04-20 ENCOUNTER — Other Ambulatory Visit: Payer: Self-pay | Admitting: Vascular Surgery

## 2022-04-20 DIAGNOSIS — M7989 Other specified soft tissue disorders: Secondary | ICD-10-CM

## 2022-07-09 ENCOUNTER — Ambulatory Visit (INDEPENDENT_AMBULATORY_CARE_PROVIDER_SITE_OTHER): Payer: Medicare Other | Admitting: Gastroenterology

## 2022-07-09 ENCOUNTER — Encounter: Payer: Self-pay | Admitting: Gastroenterology

## 2022-07-09 VITALS — BP 129/68 | HR 71 | Temp 98.7°F

## 2022-07-09 DIAGNOSIS — K922 Gastrointestinal hemorrhage, unspecified: Secondary | ICD-10-CM | POA: Diagnosis not present

## 2022-07-09 MED ORDER — PEG 3350-KCL-NA BICARB-NACL 420 G PO SOLR: 4000.0000 mL | Freq: Once | ORAL | 0 refills | Status: AC

## 2022-07-09 NOTE — Patient Instructions (Signed)
Will need to fax Eliquis clearance bact to [place] at [phone] and make sure patient follows blood thinner requirements to stop prior to procedure

## 2022-07-09 NOTE — Progress Notes (Signed)
Gastroenterology Consultation  Referring Provider:     Lavone Neri, AGNP-C Primary Care Physician:  Sheila Flynn, No Pcp Per Primary Gastroenterologist:  Dr. Allen Norris     Reason for Consultation:     Blood in colostomy        HPI:   Sheila Flynn is a 76 y.o. y/o female referred for consultation & management of blood in colostomy by Dr. Patient, No Pcp Per.  This Sheila Flynn comes in today with a history of strokes in the past and a diagnosis of rectal cancer back in 2010.  The Sheila Flynn denies having a repeat colonoscopy since then.  The Sheila Flynn is not able to give much history due to some aphasia.  She does report that she came today without a idea why she was coming.  She denies any black stools or seeing bloody stools.  The Sheila Flynn had previously been seen by Dr. Owens Loffler in [place].  Past Medical History:  Diagnosis Date   Acute respiratory failure ([place]) 12/21/10   Aspiration pneumonia (HCC)    CAD (coronary artery disease)    Dr. 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[place])    Migraine    "monthly" (11/06/2014)   Myocardial infarction ([place])    "I've had about 3" (11/06/2014)   On home oxygen therapy    "prn at the nursing home" (11/06/2014)   Pulmonary hypertension ([place])    Rectal cancer [facility])    s/p colostomy   Sacral fracture, closed ([place])    s/p kyphoplasty   Stroke ([place]) 2011   postop   Stroke [facility])  ?2014   "left side sometimes weaker since" (11/06/2014)   Thyroid goiter    Vascular dementia with depressed mood     Past Surgical History:  Procedure Laterality Date   ABDOMINAL HYSTERECTOMY     APPENDECTOMY     COLON SURGERY     COLOSTOMY     rectal ca   KYPHOPLASTY     sacrum   right cataract  11-19-2015   TONSILLECTOMY     TUBAL LIGATION      Prior to Admission medications   Medication Sig Start Date End Date Taking? Authorizing Provider  acetaminophen (TYLENOL) 325 MG tablet Take 650 mg by mouth every 6 (six) hours as needed for mild pain.    [provider]  amLODipine (NORVASC) 10 MG tablet Take 10 mg by mouth daily.    [provider]  apixaban (ELIQUIS) 5 MG TABS tablet Take 5 mg by mouth 2 (two) times daily.    [provider]  Artificial Tear Solution (TEARS PURE) 0.1-0.3 % SOLN Place 1 drop into both eyes every 8 (eight) hours as needed (dry eyes).    [provider]  aspirin 81 MG EC tablet Take 81 mg by mouth daily. Swallow whole.    [provider]  atorvastatin (LIPITOR) 40 MG tablet Take 40 mg by  mouth at bedtime.    [provider]  carvedilol (COREG) 6.25 MG tablet Take 6.25 mg by mouth 2 (two) times daily.    [provider]  Cholecalciferol (VITAMIN D-3) 125 MCG (5000 UT) TABS Take 5,000 Units by mouth every morning.    [provider]  eszopiclone (LUNESTA) 2 MG TABS tablet Take 2 mg by mouth at bedtime. Take immediately before bedtime    [provider]  hydrALAZINE (APRESOLINE) 25 MG tablet Take 25 mg by mouth in the morning and at bedtime.    [provider]  hydrocortisone (CORTEF) 10 MG tablet Take 1 tablet (10 mg total) by mouth daily with breakfast. Sheila Flynn taking differently: Take 10 mg by mouth daily. Also takes '5mg'$  dose in the morning 03/01/21   Sheila Essex, MD  hydrocortisone (CORTEF) 5 MG tablet Take 1 tablet (5 mg total) by mouth daily with lunch. Sheila Flynn  taking differently: Take 5 mg by mouth every evening. Also takes '10mg'$  dose in the morning 03/01/21   Sheila Essex, MD  isosorbide mononitrate (ISMO) 20 MG tablet Take 1 tablet (20 mg total) by mouth 2 (two) times daily at 10 AM and 5 PM. 03/01/21   Sheila Essex, MD  losartan (COZAAR) 50 MG tablet Take 50 mg by mouth 2 (two) times daily with a meal.    [provider]  Melatonin 10 MG TABS Take 10 mg by mouth at bedtime.    [provider]  mirtazapine (REMERON) 7.5 MG tablet Take 7.5 mg by mouth at bedtime.    [provider]  Multiple Vitamin (MULTIVITAMIN WITH MINERALS) TABS tablet Take 1 tablet by mouth daily. 03/01/21   Sheila Essex, MD  nitroGLYCERIN (NITROSTAT) 0.4 MG SL tablet Place 0.4 mg under the tongue every 5 (five) minutes x 3 doses as needed for chest pain.    [provider]  Nutritional Supplements (,FEEDING SUPPLEMENT, PROSOURCE PLUS) liquid Take 30 mLs by mouth 2 (two) times daily between meals. 03/01/21   Sheila Essex, MD  omeprazole (PRILOSEC) 20 MG capsule Take 20 mg by mouth daily at 6 (six) AM.    [provider]  ondansetron (ZOFRAN) 4 MG tablet Take 4 mg by mouth every 8 (eight) hours as needed for nausea.    [provider]  oxyCODONE (OXY IR/ROXICODONE) 5 MG immediate release tablet Take 5 mg by mouth every 12 (twelve) hours as needed for moderate pain or severe pain.    [provider]  Polyethyl Glycol-Propyl Glycol (SYSTANE) 0.4-0.3 % SOLN Place 1 drop into both eyes daily.    [provider]  polyethylene glycol (MIRALAX / GLYCOLAX) 17 g packet Take 17 g by mouth daily. 03/01/21   Sheila Essex, MD  sennosides-docusate sodium (SENOKOT-S) 8.6-50 MG tablet Take 1 tablet by mouth daily.    [provider]    No family history on file.   Social History   Tobacco Use   Smoking status: Former    Packs/day: 0.50    Years: 2.00    Total pack years: 1.00    Types: Cigarettes    Smokeless tobacco: Never   Tobacco comments:    "quit smoking cigarettes in 1969"  Substance Use Topics   Alcohol use: No   Drug use: Yes    Comment: previously smoked crack, has had positive UDS for amphetamines and narcotics    Allergies as of 07/09/2022 - Review Complete 01/14/2022  Allergen Reaction Noted   Ace inhibitors Swelling 10/10/2013  Review of Systems:    All systems reviewed and negative except where noted in HPI.   Physical Exam:  BP 129/68   Pulse 71   Temp 98.7 F (37.1 C) (Oral)  No LMP recorded. Sheila Flynn has had a hysterectomy. General:   Alert,  Well-developed, well-nourished, pleasant and cooperative in NAD Head:  Normocephalic and atraumatic. Eyes:  Sclera clear, no icterus.   Conjunctiva pink. Ears:  Normal auditory acuity. Neck:  Supple; no masses or thyromegaly. Lungs:  Respirations even and unlabored.  Clear throughout to auscultation.   No wheezes, crackles, or rhonchi. No acute distress. Heart:  Regular rate and rhythm; no murmurs, clicks, rubs, or gallops. Abdomen:  Normal bowel sounds.  No bruits.  Soft, non-tender and non-distended without masses, hepatosplenomegaly or hernias noted.  No guarding or rebound tenderness.  Negative Carnett sign.   Rectal:  Deferred.  Pulses:  Normal pulses noted. Extremities:  No clubbing or edema.  No cyanosis. Neurologic:  Alert and oriented x3;   Skin:  Intact without significant lesions or rashes.  No jaundice. Lymph Nodes:  No significant cervical adenopathy. Psych:  Alert and cooperative. Normal mood and affect.  Imaging Studies: No results found.  Assessment and Plan:   TAUHEEDAH BOK is a 76 y.o. y/o female with a report of bleeding from her colostomy.  The Sheila Flynn is unable to give much history.  She is also on Eliquis.  The Sheila Flynn will need to be set up for colonoscopy due to her history of colon cancer not having a recent colonoscopy with a report of blood in her ostomy bag.  The Sheila Flynn will  also need to stop her blood thinners prior to the colonoscopy.  The Sheila Flynn has been explained the plan.Lucilla Lame, MD. Marval Regal    Note: This dictation was prepared with Dragon dictation along with smaller phrase technology. Any transcriptional errors that result from this process are unintentional.

## 2022-07-09 NOTE — Addendum Note (Signed)
Addended by: Lurlean Nanny on: 07/09/2022 04:51 PM   Modules accepted: Orders

## 2022-09-01 ENCOUNTER — Encounter: Payer: Self-pay | Admitting: Certified Registered"

## 2022-09-01 ENCOUNTER — Encounter: Admission: RE | Payer: Self-pay | Source: Home / Self Care

## 2022-09-01 ENCOUNTER — Ambulatory Visit: Admission: RE | Admit: 2022-09-01 | Payer: Medicare Other | Source: Home / Self Care | Admitting: Gastroenterology

## 2022-09-01 SURGERY — COLONOSCOPY WITH PROPOFOL
Anesthesia: General

## 2022-09-02 ENCOUNTER — Telehealth: Payer: Self-pay

## 2022-09-02 NOTE — Telephone Encounter (Signed)
Received phone call from Levada Dy Furniture conservator/restorer) from [employer] requesting to reschedule patients colonoscopy due to a "missed prep".  Her colonoscopy was scheduled during her 07/09/22 office visit with Dr. Allen Norris.  Informed Levada Dy that I will message Threasa Beards, Dr. Dorothey Baseman CMA to have her call [facility] in regards to rescheduling patients procedure.    Levada Dy said if she is not available you may speak with April Nature conservation officer at [facility])   Phone # [phone]  Thanks,  Sharyn Lull, [place]

## 2022-09-08 ENCOUNTER — Telehealth: Payer: Self-pay | Admitting: Gastroenterology

## 2022-09-08 NOTE — Telephone Encounter (Signed)
Per call from Levada Dy from transportation would like to speak with someone about making appt for pt.I I think  someone had spoken to someone earlier she said she not clear on what's going on.

## 2022-09-09 NOTE — Telephone Encounter (Signed)
I spoke to April and she let me know that Levada Dy is out of the office today, will try again tomorrow

## 2022-09-15 ENCOUNTER — Other Ambulatory Visit: Payer: Self-pay

## 2022-09-15 DIAGNOSIS — K922 Gastrointestinal hemorrhage, unspecified: Secondary | ICD-10-CM

## 2022-09-15 NOTE — Telephone Encounter (Signed)
Pt has been rescheduled for December 5 and I let Levada Dy know that I will be mailing the instructions with blood thinner/procedure clearance and we will need the clearance faxed back to the office prior to the day of the procedure... I also told April the same during last conversation

## 2022-10-13 ENCOUNTER — Encounter: Admission: RE | Payer: Self-pay | Source: Ambulatory Visit

## 2022-10-13 ENCOUNTER — Encounter: Payer: Self-pay | Admitting: Certified Registered Nurse Anesthetist

## 2022-10-13 ENCOUNTER — Ambulatory Visit: Admission: RE | Admit: 2022-10-13 | Payer: Medicare Other | Source: Ambulatory Visit | Admitting: Gastroenterology

## 2022-10-13 SURGERY — COLONOSCOPY WITH PROPOFOL
Anesthesia: General

## 2022-10-15 ENCOUNTER — Telehealth: Payer: Self-pay

## 2022-10-15 NOTE — Telephone Encounter (Signed)
Suezanne Jacquet, NP [employer], lmovm requesting a lower volume prep for pt as due to pt's dementia, she could not handle the larger prep amount.Marland KitchenMarland KitchenMarland Kitchen

## 2022-11-25 ENCOUNTER — Emergency Department (HOSPITAL_COMMUNITY): Payer: Medicare Other

## 2022-11-25 ENCOUNTER — Inpatient Hospital Stay (HOSPITAL_COMMUNITY): Payer: Medicare Other

## 2022-11-25 ENCOUNTER — Inpatient Hospital Stay (HOSPITAL_COMMUNITY)
Admission: EM | Admit: 2022-11-25 | Discharge: 2022-12-10 | DRG: 871 | Disposition: E | Payer: Medicare Other | Source: Skilled Nursing Facility | Attending: Pulmonary Disease | Admitting: Pulmonary Disease

## 2022-11-25 DIAGNOSIS — B967 Clostridium perfringens [C. perfringens] as the cause of diseases classified elsewhere: Secondary | ICD-10-CM | POA: Diagnosis present

## 2022-11-25 DIAGNOSIS — E874 Mixed disorder of acid-base balance: Secondary | ICD-10-CM | POA: Diagnosis present

## 2022-11-25 DIAGNOSIS — I442 Atrioventricular block, complete: Secondary | ICD-10-CM | POA: Diagnosis present

## 2022-11-25 DIAGNOSIS — J9611 Chronic respiratory failure with hypoxia: Secondary | ICD-10-CM

## 2022-11-25 DIAGNOSIS — E46 Unspecified protein-calorie malnutrition: Secondary | ICD-10-CM | POA: Diagnosis present

## 2022-11-25 DIAGNOSIS — K219 Gastro-esophageal reflux disease without esophagitis: Secondary | ICD-10-CM | POA: Diagnosis present

## 2022-11-25 DIAGNOSIS — Z79899 Other long term (current) drug therapy: Secondary | ICD-10-CM

## 2022-11-25 DIAGNOSIS — J449 Chronic obstructive pulmonary disease, unspecified: Secondary | ICD-10-CM | POA: Diagnosis present

## 2022-11-25 DIAGNOSIS — G9340 Encephalopathy, unspecified: Secondary | ICD-10-CM | POA: Diagnosis not present

## 2022-11-25 DIAGNOSIS — Z66 Do not resuscitate: Secondary | ICD-10-CM | POA: Diagnosis present

## 2022-11-25 DIAGNOSIS — Z515 Encounter for palliative care: Secondary | ICD-10-CM | POA: Diagnosis not present

## 2022-11-25 DIAGNOSIS — C642 Malignant neoplasm of left kidney, except renal pelvis: Secondary | ICD-10-CM | POA: Diagnosis present

## 2022-11-25 DIAGNOSIS — A4189 Other specified sepsis: Principal | ICD-10-CM | POA: Diagnosis present

## 2022-11-25 DIAGNOSIS — K7200 Acute and subacute hepatic failure without coma: Secondary | ICD-10-CM | POA: Diagnosis present

## 2022-11-25 DIAGNOSIS — E1169 Type 2 diabetes mellitus with other specified complication: Secondary | ICD-10-CM | POA: Diagnosis present

## 2022-11-25 DIAGNOSIS — I272 Pulmonary hypertension, unspecified: Secondary | ICD-10-CM | POA: Diagnosis present

## 2022-11-25 DIAGNOSIS — R4701 Aphasia: Secondary | ICD-10-CM | POA: Diagnosis present

## 2022-11-25 DIAGNOSIS — I252 Old myocardial infarction: Secondary | ICD-10-CM

## 2022-11-25 DIAGNOSIS — E1149 Type 2 diabetes mellitus with other diabetic neurological complication: Secondary | ICD-10-CM | POA: Diagnosis present

## 2022-11-25 DIAGNOSIS — E872 Acidosis, unspecified: Secondary | ICD-10-CM | POA: Diagnosis not present

## 2022-11-25 DIAGNOSIS — I5032 Chronic diastolic (congestive) heart failure: Secondary | ICD-10-CM | POA: Diagnosis present

## 2022-11-25 DIAGNOSIS — E1151 Type 2 diabetes mellitus with diabetic peripheral angiopathy without gangrene: Secondary | ICD-10-CM | POA: Diagnosis present

## 2022-11-25 DIAGNOSIS — R6521 Severe sepsis with septic shock: Secondary | ICD-10-CM | POA: Diagnosis present

## 2022-11-25 DIAGNOSIS — G9341 Metabolic encephalopathy: Secondary | ICD-10-CM | POA: Diagnosis present

## 2022-11-25 DIAGNOSIS — Z933 Colostomy status: Secondary | ICD-10-CM

## 2022-11-25 DIAGNOSIS — I462 Cardiac arrest due to underlying cardiac condition: Secondary | ICD-10-CM | POA: Diagnosis present

## 2022-11-25 DIAGNOSIS — J9622 Acute and chronic respiratory failure with hypercapnia: Secondary | ICD-10-CM | POA: Diagnosis present

## 2022-11-25 DIAGNOSIS — I130 Hypertensive heart and chronic kidney disease with heart failure and stage 1 through stage 4 chronic kidney disease, or unspecified chronic kidney disease: Secondary | ICD-10-CM | POA: Diagnosis present

## 2022-11-25 DIAGNOSIS — E1122 Type 2 diabetes mellitus with diabetic chronic kidney disease: Secondary | ICD-10-CM | POA: Diagnosis present

## 2022-11-25 DIAGNOSIS — J9612 Chronic respiratory failure with hypercapnia: Secondary | ICD-10-CM

## 2022-11-25 DIAGNOSIS — J9621 Acute and chronic respiratory failure with hypoxia: Secondary | ICD-10-CM | POA: Diagnosis present

## 2022-11-25 DIAGNOSIS — I469 Cardiac arrest, cause unspecified: Principal | ICD-10-CM

## 2022-11-25 DIAGNOSIS — J9602 Acute respiratory failure with hypercapnia: Secondary | ICD-10-CM

## 2022-11-25 DIAGNOSIS — Z7901 Long term (current) use of anticoagulants: Secondary | ICD-10-CM

## 2022-11-25 DIAGNOSIS — N179 Acute kidney failure, unspecified: Secondary | ICD-10-CM | POA: Diagnosis present

## 2022-11-25 DIAGNOSIS — E785 Hyperlipidemia, unspecified: Secondary | ICD-10-CM | POA: Diagnosis present

## 2022-11-25 DIAGNOSIS — Z87891 Personal history of nicotine dependence: Secondary | ICD-10-CM

## 2022-11-25 DIAGNOSIS — Z888 Allergy status to other drugs, medicaments and biological substances status: Secondary | ICD-10-CM

## 2022-11-25 DIAGNOSIS — Z1152 Encounter for screening for COVID-19: Secondary | ICD-10-CM

## 2022-11-25 DIAGNOSIS — J9601 Acute respiratory failure with hypoxia: Secondary | ICD-10-CM

## 2022-11-25 DIAGNOSIS — J690 Pneumonitis due to inhalation of food and vomit: Secondary | ICD-10-CM | POA: Diagnosis present

## 2022-11-25 DIAGNOSIS — E2740 Unspecified adrenocortical insufficiency: Secondary | ICD-10-CM | POA: Diagnosis present

## 2022-11-25 DIAGNOSIS — R579 Shock, unspecified: Secondary | ICD-10-CM

## 2022-11-25 DIAGNOSIS — Z8673 Personal history of transient ischemic attack (TIA), and cerebral infarction without residual deficits: Secondary | ICD-10-CM

## 2022-11-25 DIAGNOSIS — Z7982 Long term (current) use of aspirin: Secondary | ICD-10-CM

## 2022-11-25 DIAGNOSIS — Z85048 Personal history of other malignant neoplasm of rectum, rectosigmoid junction, and anus: Secondary | ICD-10-CM

## 2022-11-25 DIAGNOSIS — F0150 Vascular dementia without behavioral disturbance: Secondary | ICD-10-CM | POA: Diagnosis present

## 2022-11-25 DIAGNOSIS — I4901 Ventricular fibrillation: Secondary | ICD-10-CM | POA: Diagnosis present

## 2022-11-25 DIAGNOSIS — I2510 Atherosclerotic heart disease of native coronary artery without angina pectoris: Secondary | ICD-10-CM | POA: Diagnosis present

## 2022-11-25 LAB — I-STAT ARTERIAL BLOOD GAS, ED
Acid-base deficit: 14 mmol/L — ABNORMAL HIGH (ref 0.0–2.0)
Acid-base deficit: 11 mmol/L — ABNORMAL HIGH (ref 0.0–2.0)
Bicarbonate: 17.7 mmol/L — ABNORMAL LOW (ref 20.0–28.0)
Bicarbonate: 15.8 mmol/L — ABNORMAL LOW (ref 20.0–28.0)
Calcium, Ion: 1.09 mmol/L — ABNORMAL LOW (ref 1.15–1.40)
Calcium, Ion: 1.05 mmol/L — ABNORMAL LOW (ref 1.15–1.40)
HCT: 25 % — ABNORMAL LOW (ref 36.0–46.0)
HCT: 25 % — ABNORMAL LOW (ref 36.0–46.0)
Hemoglobin: 8.5 g/dL — ABNORMAL LOW (ref 12.0–15.0)
Hemoglobin: 8.5 g/dL — ABNORMAL LOW (ref 12.0–15.0)
O2 Saturation: 86 %
O2 Saturation: 92 %
Potassium: 4.8 mmol/L (ref 3.5–5.1)
Potassium: 5.2 mmol/L — ABNORMAL HIGH (ref 3.5–5.1)
Sodium: 141 mmol/L (ref 135–145)
Sodium: 139 mmol/L (ref 135–145)
TCO2: 20 mmol/L — ABNORMAL LOW (ref 22–32)
TCO2: 17 mmol/L — ABNORMAL LOW (ref 22–32)
pCO2 arterial: 82.7 mmHg (ref 32–48)
pCO2 arterial: 36.6 mmHg (ref 32–48)
pH, Arterial: 6.939 — CL (ref 7.35–7.45)
pH, Arterial: 7.242 — ABNORMAL LOW (ref 7.35–7.45)
pO2, Arterial: 83 mmHg (ref 83–108)
pO2, Arterial: 74 mmHg — ABNORMAL LOW (ref 83–108)

## 2022-11-25 LAB — CBC WITH DIFFERENTIAL/PLATELET
Abs Immature Granulocytes: 1.14 10*3/uL — ABNORMAL HIGH (ref 0.00–0.07)
Basophils Absolute: 0.1 10*3/uL (ref 0.0–0.1)
Basophils Relative: 0 %
Eosinophils Absolute: 0.2 10*3/uL (ref 0.0–0.5)
Eosinophils Relative: 1 %
HCT: 35.7 % — ABNORMAL LOW (ref 36.0–46.0)
Hemoglobin: 10.1 g/dL — ABNORMAL LOW (ref 12.0–15.0)
Immature Granulocytes: 8 %
Lymphocytes Relative: 43 %
Lymphs Abs: 6 10*3/uL — ABNORMAL HIGH (ref 0.7–4.0)
MCH: 24.4 pg — ABNORMAL LOW (ref 26.0–34.0)
MCHC: 28.3 g/dL — ABNORMAL LOW (ref 30.0–36.0)
MCV: 86.2 fL (ref 80.0–100.0)
Monocytes Absolute: 0.5 10*3/uL (ref 0.1–1.0)
Monocytes Relative: 4 %
Neutro Abs: 6.2 10*3/uL (ref 1.7–7.7)
Neutrophils Relative %: 44 %
Platelets: 235 10*3/uL (ref 150–400)
RBC: 4.14 MIL/uL (ref 3.87–5.11)
RDW: 17.2 % — ABNORMAL HIGH (ref 11.5–15.5)
WBC: 14.1 10*3/uL — ABNORMAL HIGH (ref 4.0–10.5)
nRBC: 0.1 % (ref 0.0–0.2)

## 2022-11-25 LAB — COMPREHENSIVE METABOLIC PANEL
ALT: 511 U/L — ABNORMAL HIGH (ref 0–44)
AST: 626 U/L — ABNORMAL HIGH (ref 15–41)
Albumin: 2.8 g/dL — ABNORMAL LOW (ref 3.5–5.0)
Alkaline Phosphatase: 76 U/L (ref 38–126)
Anion gap: 20 — ABNORMAL HIGH (ref 5–15)
BUN: 25 mg/dL — ABNORMAL HIGH (ref 8–23)
CO2: 17 mmol/L — ABNORMAL LOW (ref 22–32)
Calcium: 8.2 mg/dL — ABNORMAL LOW (ref 8.9–10.3)
Chloride: 103 mmol/L (ref 98–111)
Creatinine, Ser: 2.69 mg/dL — ABNORMAL HIGH (ref 0.44–1.00)
GFR, Estimated: 18 mL/min — ABNORMAL LOW (ref >60)
Glucose, Bld: 268 mg/dL — ABNORMAL HIGH (ref 70–99)
Potassium: 4.5 mmol/L (ref 3.5–5.1)
Sodium: 140 mmol/L (ref 135–145)
Total Bilirubin: 0.3 mg/dL (ref 0.3–1.2)
Total Protein: 6 g/dL — ABNORMAL LOW (ref 6.5–8.1)

## 2022-11-25 LAB — ECHOCARDIOGRAM COMPLETE
Area-P 1/2: 1.96 cm2
Height: 67 in
S' Lateral: 2.3 cm
Weight: 2469.15 oz

## 2022-11-25 LAB — I-STAT CHEM 8, ED
BUN: 35 mg/dL — ABNORMAL HIGH (ref 8–23)
Calcium, Ion: 1.03 mmol/L — ABNORMAL LOW (ref 1.15–1.40)
Chloride: 106 mmol/L (ref 98–111)
Creatinine, Ser: 2.5 mg/dL — ABNORMAL HIGH (ref 0.44–1.00)
Glucose, Bld: 259 mg/dL — ABNORMAL HIGH (ref 70–99)
HCT: 35 % — ABNORMAL LOW (ref 36.0–46.0)
Hemoglobin: 11.9 g/dL — ABNORMAL LOW (ref 12.0–15.0)
Potassium: 4.5 mmol/L (ref 3.5–5.1)
Sodium: 140 mmol/L (ref 135–145)
TCO2: 20 mmol/L — ABNORMAL LOW (ref 22–32)

## 2022-11-25 LAB — TROPONIN I (HIGH SENSITIVITY)
Troponin I (High Sensitivity): 60 ng/L — ABNORMAL HIGH (ref <18)
Troponin I (High Sensitivity): 3708 ng/L (ref <18)

## 2022-11-25 LAB — CORTISOL: Cortisol, Plasma: 5.6 ug/dL

## 2022-11-25 LAB — RESP PANEL BY RT-PCR (RSV, FLU A&B, COVID)  RVPGX2
Influenza A by PCR: NEGATIVE
Influenza B by PCR: NEGATIVE
Resp Syncytial Virus by PCR: POSITIVE — AB
SARS Coronavirus 2 by RT PCR: NEGATIVE

## 2022-11-25 LAB — LACTIC ACID, PLASMA
Lactic Acid, Venous: >9 mmol/L (ref 0.5–1.9)
Lactic Acid, Venous: >9 mmol/L (ref 0.5–1.9)

## 2022-11-25 LAB — TSH: TSH: 1.715 u[IU]/mL (ref 0.350–4.500)

## 2022-11-25 LAB — HEMOGLOBIN A1C
Hgb A1c MFr Bld: 6.5 % — ABNORMAL HIGH (ref 4.8–5.6)
Mean Plasma Glucose: 139.85 mg/dL

## 2022-11-25 LAB — GLUCOSE, CAPILLARY
Glucose-Capillary: 202 mg/dL — ABNORMAL HIGH (ref 70–99)
Glucose-Capillary: 169 mg/dL — ABNORMAL HIGH (ref 70–99)

## 2022-11-25 LAB — CBG MONITORING, ED: Glucose-Capillary: 214 mg/dL — ABNORMAL HIGH (ref 70–99)

## 2022-11-25 LAB — MAGNESIUM: Magnesium: 2.5 mg/dL — ABNORMAL HIGH (ref 1.7–2.4)

## 2022-11-25 LAB — BRAIN NATRIURETIC PEPTIDE: B Natriuretic Peptide: 155 pg/mL — ABNORMAL HIGH (ref 0.0–100.0)

## 2022-11-25 MED ORDER — MIDAZOLAM HCL 2 MG/2ML IJ SOLN: 1.0000 mg | INTRAMUSCULAR | Status: DC | PRN

## 2022-11-25 MED ORDER — EPINEPHRINE 1 MG/10ML IJ SOSY
PREFILLED_SYRINGE | INTRAMUSCULAR | Status: AC | PRN
  Administered 2022-11-25 (×2): .1 mg via INTRAVENOUS

## 2022-11-25 MED ORDER — SODIUM BICARBONATE 8.4 % IV SOLN
INTRAVENOUS | Status: AC | PRN
  Administered 2022-11-25: 50 meq via INTRAVENOUS

## 2022-11-25 MED ORDER — DOCUSATE SODIUM 50 MG/5ML PO LIQD: 100.0000 mg | Freq: Two times a day (BID) | ORAL | Status: DC

## 2022-11-25 MED ORDER — STERILE WATER FOR INJECTION IV SOLN
INTRAVENOUS | Status: DC
  Filled 2022-11-25 (×3): qty 1000

## 2022-11-25 MED ORDER — PANTOPRAZOLE SODIUM 40 MG IV SOLR: 40.0000 mg | Freq: Every day | INTRAVENOUS | Status: DC

## 2022-11-25 MED ORDER — NOREPINEPHRINE 4 MG/250ML-% IV SOLN
0.0000 ug/min | INTRAVENOUS | Status: DC
  Administered 2022-11-25: 2 ug/min via INTRAVENOUS
  Administered 2022-11-25: 40 ug/min via INTRAVENOUS
  Filled 2022-11-25 (×2): qty 250

## 2022-11-25 MED ORDER — CALCIUM GLUCONATE-NACL 1-0.675 GM/50ML-% IV SOLN
1.0000 g | Freq: Once | INTRAVENOUS | Status: AC
  Administered 2022-11-25: 1000 mg via INTRAVENOUS
  Filled 2022-11-25: qty 50

## 2022-11-25 MED ORDER — MAGNESIUM SULFATE 50 % IJ SOLN
INTRAMUSCULAR | Status: AC | PRN
  Administered 2022-11-25: 2 g via INTRAVENOUS

## 2022-11-25 MED ORDER — INSULIN ASPART 100 UNIT/ML IJ SOLN
0.0000 [IU] | INTRAMUSCULAR | Status: DC
  Administered 2022-11-25 (×2): 5 [IU] via SUBCUTANEOUS

## 2022-11-25 MED ORDER — HYDROCORTISONE SOD SUC (PF) 100 MG IJ SOLR
100.0000 mg | Freq: Two times a day (BID) | INTRAMUSCULAR | Status: DC
  Administered 2022-11-25: 100 mg via INTRAVENOUS
  Filled 2022-11-25: qty 2

## 2022-11-25 MED ORDER — NOREPINEPHRINE 16 MG/250ML-% IV SOLN
0.0000 ug/min | INTRAVENOUS | Status: DC
  Administered 2022-11-25: 40 ug/min via INTRAVENOUS
  Filled 2022-11-25: qty 250

## 2022-11-25 MED ORDER — FENTANYL CITRATE PF 50 MCG/ML IJ SOSY: 50.0000 ug | PREFILLED_SYRINGE | INTRAMUSCULAR | Status: DC | PRN

## 2022-11-25 MED ORDER — ASPIRIN 81 MG PO CHEW
81.0000 mg | CHEWABLE_TABLET | Freq: Every day | ORAL | Status: DC
  Administered 2022-11-25: 81 mg
  Filled 2022-11-25: qty 1

## 2022-11-25 MED ORDER — SODIUM BICARBONATE 8.4 % IV SOLN
100.0000 meq | Freq: Once | INTRAVENOUS | Status: AC
  Administered 2022-11-25: 100 meq via INTRAVENOUS
  Filled 2022-11-25: qty 100

## 2022-11-25 MED ORDER — POLYETHYLENE GLYCOL 3350 17 G PO PACK: 17.0000 g | PACK | Freq: Every day | ORAL | Status: DC

## 2022-11-25 MED ORDER — EPINEPHRINE HCL 5 MG/250ML IV SOLN IN NS
0.5000 ug/min | INTRAVENOUS | Status: DC
  Administered 2022-11-25 (×2): 20 ug/min via INTRAVENOUS
  Filled 2022-11-25 (×2): qty 250

## 2022-11-25 MED ORDER — HEPARIN (PORCINE) 25000 UT/250ML-% IV SOLN
1100.0000 [IU]/h | INTRAVENOUS | Status: DC
  Administered 2022-11-25: 1100 [IU]/h via INTRAVENOUS
  Filled 2022-11-25: qty 250

## 2022-11-25 MED ORDER — POLYETHYLENE GLYCOL 3350 17 G PO PACK: 17.0000 g | PACK | Freq: Every day | ORAL | Status: DC | PRN

## 2022-11-25 MED ORDER — SODIUM CHLORIDE 0.9 % IV SOLN
3.0000 g | Freq: Two times a day (BID) | INTRAVENOUS | Status: DC
  Administered 2022-11-25: 3 g via INTRAVENOUS
  Filled 2022-11-25: qty 8

## 2022-11-25 MED ORDER — DOCUSATE SODIUM 100 MG PO CAPS: 100.0000 mg | ORAL_CAPSULE | Freq: Two times a day (BID) | ORAL | Status: DC | PRN

## 2022-11-25 MED ORDER — EPINEPHRINE HCL 5 MG/250ML IV SOLN IN NS
INTRAVENOUS | Status: AC
  Administered 2022-11-25: 5 ug/min via INTRAVENOUS
  Filled 2022-11-25: qty 250

## 2022-11-26 LAB — BLOOD CULTURE ID PANEL (REFLEXED) - BCID2

## 2022-11-27 LAB — CULTURE, RESPIRATORY W GRAM STAIN: Culture: NORMAL

## 2022-11-28 LAB — CULTURE, BLOOD (ROUTINE X 2)
Special Requests: ADEQUATE
Special Requests: ADEQUATE

## 2022-12-10 NOTE — Progress Notes (Signed)
IO remover pre protocol. No bleeding post removal. Vaseline and cotton gauze applied with transparent dressing. Bedside nurse aware.

## 2022-12-10 NOTE — Code Documentation (Signed)
Epi in

## 2022-12-10 NOTE — Progress Notes (Signed)
Pharmacy Antibiotic Note  Sheila Flynn is a 77 y.o. female admitted on 12-12-2022 with [place] has been consulted for Unasyn dosing.  CrCl <30 mL/min - in AKI  Plan: Unasyn 3g Q12H F/u cultures for de-escalation and LOT Monitor renal function for dose adjustments  Height: '5\' 7"'$  (170.2 cm) IBW/kg (Calculated) : 61.6  No data recorded.  Recent Labs  Lab 12-12-2022 0712 December 12, 2022 0718 12-12-22 0724  WBC 14.1*  --   --   CREATININE 2.69*  --  2.50*  LATICACIDVEN  --  >9.0*  --     CrCl cannot be calculated (Unknown ideal weight.).    Allergies  Allergen Reactions   Ace Inhibitors Swelling    Antimicrobials this admission: Unasyn 12-12-22 >>  Microbiology results: 12-Dec-2022 Bcx: ordered 12/12/2022 Resp Cx: ordered  Thank you for allowing pharmacy to be a part of this patient's care.  Merrilee Jansky, PharmD Clinical Pharmacist December 12, 2022 9:13 AM

## 2022-12-10 NOTE — Progress Notes (Signed)
Echocardiogram 2D Echocardiogram has been performed.  Oneal Deputy Voris Tigert RDCS 2022/12/12, 9:37 AM  Notified Dr. Stanford Breed of stat

## 2022-12-10 NOTE — Procedures (Signed)
Central Venous Catheter Insertion Procedure Note  Sheila Flynn  [id number]  06-Aug-1946  Date:December 04, 2022  Time:11:14 AM   Provider Performing:Santo Zahradnik Shearon Stalls   Procedure: Insertion of Non-tunneled Central Venous Catheter(36556) with US guidance (40981)   Indication(s) Medication administration  Consent Unable to obtain consent due to emergent nature of procedure.  Anesthesia Topical only with 1% lidocaine   Timeout Verified patient identification, verified procedure, site/side was marked, verified correct patient position, special equipment/implants available, medications/allergies/relevant history reviewed, required imaging and test results available.  Sterile Technique Maximal sterile technique including full sterile barrier drape, hand hygiene, sterile gown, sterile gloves, mask, hair covering, sterile ultrasound probe cover (if used).  Procedure Description Area of catheter insertion was cleaned with chlorhexidine and draped in sterile fashion.  With real-time ultrasound guidance a central venous catheter was placed into the right femoral vein. Nonpulsatile blood flow and easy flushing noted in all ports.  The catheter was sutured in place and sterile dressing applied.  Complications/Tolerance None; patient tolerated the procedure well. Chest X-ray is ordered to verify placement for internal jugular or subclavian cannulation.   Chest x-ray is not ordered for femoral cannulation.  EBL Minimal  Specimen(s) None   Montey Hora, PA - [facility] For pager details, please see AMION or use Epic chat  After 1900, please call [place] for cross coverage needs 12/04/2022, 11:14 AM

## 2022-12-10 NOTE — Code Documentation (Signed)
Shock administered

## 2022-12-10 NOTE — Progress Notes (Signed)
MD made aware of critical ABG results. RR increased to 35 on vent.   Latest Reference Range & Units Dec 09, 2022 08:07  Sample type  ARTERIAL  pH, Arterial 7.35 - 7.45  6.939 (LL)  pCO2 arterial 32 - 48 mmHg 82.7 (HH)  pO2, Arterial 83 - 108 mmHg 83  TCO2 22 - 32 mmol/L 20 (L)  Acid-base deficit 0.0 - 2.0 mmol/L 14.0 (H)  Bicarbonate 20.0 - 28.0 mmol/L 17.7 (L)  O2 Saturation % 86  (LL): Data is critically low [facility]): Data is critically high (L): Data is abnormally low (H): Data is abnormally high

## 2022-12-10 NOTE — ED Provider Notes (Signed)
[facility] EMERGENCY DEPARTMENT Provider Note   CSN: [id number] Arrival date & time: 2022-11-30  2751     History  Chief Complaint  Patient presents with   post CPR    Sheila Flynn is a 77 y.o. female.  The history is provided by the EMS personnel and medical records. The history is limited by the condition of the patient.  Cardiac Arrest Witnessed by:  Not witnessed Incident location:  Nursing home Condition upon EMS arrival:  Unresponsive Pulse:  Absent Initial cardiac rhythm per EMS:  PEA Treatments prior to arrival:  ACLS protocol, intubation and vascular access Medications given prior to ED:  Epinephrine IV access type:  Intraosseous and peripheral Airway:  Intubation prior to arrival Rhythm on admission to ED:  Paced Associated symptoms: shortness of breath        Home Medications Prior to Admission medications   Medication Sig Start Date End Date Taking? Authorizing Provider  acetaminophen (TYLENOL) 325 MG tablet Take 650 mg by mouth every 6 (six) hours as needed for mild pain.    [provider]  amLODipine (NORVASC) 10 MG tablet Take 10 mg by mouth daily.    [provider]  apixaban (ELIQUIS) 5 MG TABS tablet Take 5 mg by mouth 2 (two) times daily.    [provider]  Artificial Tear Solution (TEARS PURE) 0.1-0.3 % SOLN Place 1 drop into both eyes every 8 (eight) hours as needed (dry eyes).    [provider]  aspirin 81 MG EC tablet Take 81 mg by mouth daily. Swallow whole.    [provider]  atorvastatin (LIPITOR) 40 MG tablet Take 40 mg by mouth at bedtime.    [provider]  carvedilol (COREG) 6.25 MG tablet Take 6.25 mg by mouth 2 (two) times daily.    [provider]  Cholecalciferol (VITAMIN D-3) 125 MCG (5000 UT) TABS Take 5,000 Units by mouth every morning.    [provider]  eszopiclone (LUNESTA) 2 MG TABS tablet Take 2 mg by mouth at bedtime. Take  immediately before bedtime    [provider]  hydrALAZINE (APRESOLINE) 25 MG tablet Take 25 mg by mouth in the morning and at bedtime.    [provider]  hydrocortisone (CORTEF) 10 MG tablet Take 1 tablet (10 mg total) by mouth daily with breakfast. Patient taking differently: Take 10 mg by mouth daily. Also takes '5mg'$  dose in the morning 03/01/21   Ezequiel Essex, MD  hydrocortisone (CORTEF) 5 MG tablet Take 1 tablet (5 mg total) by mouth daily with lunch. Patient taking differently: Take 5 mg by mouth every evening. Also takes '10mg'$  dose in the morning 03/01/21   Ezequiel Essex, MD  isosorbide mononitrate (ISMO) 20 MG tablet Take 1 tablet (20 mg total) by mouth 2 (two) times daily at 10 AM and 5 PM. 03/01/21   Ezequiel Essex, MD  losartan (COZAAR) 50 MG tablet Take 50 mg by mouth 2 (two) times daily with a meal.    [provider]  Melatonin 10 MG TABS Take 10 mg by mouth at bedtime.    [provider]  mirtazapine (REMERON) 7.5 MG tablet Take 7.5 mg by mouth at bedtime.    [provider]  Multiple Vitamin (MULTIVITAMIN WITH MINERALS) TABS tablet Take 1 tablet by mouth daily. 03/01/21   Ezequiel Essex, MD  nitroGLYCERIN (NITROSTAT) 0.4 MG SL tablet Place 0.4 mg under the tongue every 5 (five) minutes x 3 doses  as needed for chest pain.    [provider]  Nutritional Supplements (,FEEDING SUPPLEMENT, PROSOURCE PLUS) liquid Take 30 mLs by mouth 2 (two) times daily between meals. 03/01/21   Ezequiel Essex, MD  omeprazole (PRILOSEC) 20 MG capsule Take 20 mg by mouth daily at 6 (six) AM.    [provider]  ondansetron (ZOFRAN) 4 MG tablet Take 4 mg by mouth every 8 (eight) hours as needed for nausea.    [provider]  oxyCODONE (OXY IR/ROXICODONE) 5 MG immediate release tablet Take 5 mg by mouth every 12 (twelve) hours as needed for moderate pain or severe pain.    [provider]  Polyethyl Glycol-Propyl Glycol  (SYSTANE) 0.4-0.3 % SOLN Place 1 drop into both eyes daily.    [provider]  polyethylene glycol (MIRALAX / GLYCOLAX) 17 g packet Take 17 g by mouth daily. 03/01/21   Ezequiel Essex, MD  sennosides-docusate sodium (SENOKOT-S) 8.6-50 MG tablet Take 1 tablet by mouth daily.    [provider]      Allergies    Ace inhibitors    Review of Systems   Review of Systems  Unable to perform ROS: Patient unresponsive  Respiratory:  Positive for shortness of breath.     Physical Exam Updated Vital Signs There were no vitals taken for this visit. Physical Exam Vitals and nursing note reviewed.  Constitutional:      General: She is in acute distress.     Appearance: She is obese. She is ill-appearing.  HENT:     Head: Normocephalic and atraumatic.     Nose: Nose normal. No congestion or rhinorrhea.     Mouth/Throat:     Mouth: Mucous membranes are moist.  Eyes:     General: No scleral icterus.    Comments: Pupils bilaterally fixed and dilated however she is on an epi drip on arrival.  Cardiovascular:     Rate and Rhythm: Bradycardia present. Rhythm irregular.  Pulmonary:     Breath sounds: Rhonchi and rales present.  Chest:     Chest wall: No tenderness.  Abdominal:     Tenderness: There is no abdominal tenderness.     Comments: Ostomy on abdomen  Musculoskeletal:        General: No tenderness.  Skin:    Findings: No erythema.  Neurological:     Mental Status: She is unresponsive.     GCS: GCS eye subscore is 1. GCS verbal subscore is 1. GCS motor subscore is 1.     ED Results / Procedures / Treatments   Labs (all labs ordered are listed, but only abnormal results are displayed) Labs Reviewed  CBC WITH DIFFERENTIAL/PLATELET - Abnormal; Notable for the following components:      Result Value   WBC 14.1 (*)    Hemoglobin 10.1 (*)    HCT 35.7 (*)    MCH 24.4 (*)    MCHC 28.3 (*)    RDW 17.2 (*)    All other components within normal limits   COMPREHENSIVE METABOLIC PANEL - Abnormal; Notable for the following components:   CO2 17 (*)    Glucose, Bld 268 (*)    BUN 25 (*)    Creatinine, Ser 2.69 (*)    Calcium 8.2 (*)    Total Protein 6.0 (*)    Albumin 2.8 (*)    AST 626 (*)    ALT 511 (*)    GFR, Estimated 18 (*)    Anion gap 20 (*)  All other components within normal limits  LACTIC ACID, PLASMA - Abnormal; Notable for the following components:   Lactic Acid, Venous >9.0 (*)    All other components within normal limits  MAGNESIUM - Abnormal; Notable for the following components:   Magnesium 2.5 (*)    All other components within normal limits  I-STAT CHEM 8, ED - Abnormal; Notable for the following components:   BUN 35 (*)    Creatinine, Ser 2.50 (*)    Glucose, Bld 259 (*)    Calcium, Ion 1.03 (*)    TCO2 20 (*)    Hemoglobin 11.9 (*)    HCT 35.0 (*)    All other components within normal limits  TROPONIN I (HIGH SENSITIVITY) - Abnormal; Notable for the following components:   Troponin I (High Sensitivity) 60 (*)    All other components within normal limits  RESP PANEL BY RT-PCR (RSV, FLU A&B, COVID)  RVPGX2  LACTIC ACID, PLASMA  TSH  BRAIN NATRIURETIC PEPTIDE  I-STAT ARTERIAL BLOOD GAS, ED    EKG None  Radiology DG Chest Portable 1 View  Result Date: 2022-12-18 CLINICAL DATA:  Status post CPR. EXAM: PORTABLE CHEST 1 VIEW COMPARISON:  01/14/2022 FINDINGS: 0735 hours. The cardio pericardial silhouette is enlarged. There is pulmonary vascular congestion without overt pulmonary edema. Patchy airspace disease in the right infrahilar lung may be related to atelectasis or pneumonia. Aspiration not excluded. Endotracheal tube tip is 2.0 cm above the base of the carina. NG tube tip is in the distal esophagus near the esophagogastric junction. Tube could be advanced 10-12 cm to place the proximal side port below the GE junction. Soft tissue opacity in the right paratracheal region is compatible with the patient's  known thyroid enlargement. Bones are diffusely demineralized. Telemetry leads overlie the chest. IMPRESSION: 1. Endotracheal tube tip 2.0 cm above the base of the carina. 2. NG tube tip is in the distal esophagus near the esophagogastric junction. Tube could be advanced 10-12 cm to place the proximal side port below the GE junction. If tube is repositioned, repeat x-ray recommended to confirm position. 3. Infrahilar right lung airspace disease compatible with atelectasis/infiltrate. Aspiration a consideration. 4. No pneumothorax or substantial pleural effusion. Electronically Signed   By: Misty Stanley M.D.   On: December 18, 2022 07:52    Procedures Procedures    CRITICAL CARE Performed by: Gwenyth Allegra Mckinsley Koelzer Total critical care time: 55 minutes Critical care time was exclusive of separately billable procedures and treating other patients. Critical care was necessary to treat or prevent imminent or life-threatening deterioration. Critical care was time spent personally by me on the following activities: development of treatment plan with patient and/or surrogate as well as nursing, discussions with consultants, evaluation of patient's response to treatment, examination of patient, obtaining history from patient or surrogate, ordering and performing treatments and interventions, ordering and review of laboratory studies, ordering and review of radiographic studies, pulse oximetry and re-evaluation of patient's condition.  Cardiopulmonary Resuscitation (CPR) Procedure Note Directed/Performed by: Gwenyth Allegra Chieko Neises I personally directed ancillary staff and/or performed CPR in an effort to regain return of spontaneous circulation and to maintain cardiac, neuro and systemic perfusion.    Medications Ordered in ED Medications  EPINEPHrine (ADRENALIN) 5 mg in NS 250 mL (0.02 mg/mL) premix infusion (5 mcg/min Intravenous New Bag/Given Dec 18, 2022 0717)  norepinephrine (LEVOPHED) '4mg'$  in 29m (0.016 mg/mL)  premix infusion (has no administration in time range)    ED Course/ Medical Decision Making/ A&P  Medical Decision Making Amount and/or Complexity of Data Reviewed Labs: ordered. Radiology: ordered.  Risk Prescription drug management. Decision regarding hospitalization.    JAYLEIGH NOTARIANNI is a 77 y.o. female with a past medical history significant for CHF, CAD, diabetes, COPD, vascular dementia, rectal adenocarcinoma, previous stroke, previous colostomy, GERD, pulmonary hypertension, migraines, and CKD who presents for cardiac arrest.  According to EMS, patient was at nursing facility and called out for shortness of breath just after 7 AM.  Patient then went into cardiac arrest and was found to be PEA arrest.  Patient was in and out of ROSC multiple times and had about 7 doses of epinephrine.  She was intubated in the field successfully with a 7 oh ET tube.  Patient eventually got ROSC and then was in symptomatic bradycardia and started pacing.  Patient arrived getting paced with a pulse.  On my arrival, patient was monitored and eventually lost pulses again.  We did a round of CPR initially and gave her epinephrine.  We continued the epi drip and added Levophed as well.  We then had intermittent CPR and ROSC for about 30 minutes eventually getting pulses back with bradycardia and what appeared to be a complete heart block and then started pacing.  At 1 point she did have a shockable rhythm that appeared to be either V-fib versus torsades.  Mag was ordered.  Critical care and STEMI cardiologist were called and came to the bedside.  8:02 AM They are managing patient as my last left the room her heart rate was in the 80s with a blood pressure in the 90s not being paced.  Critical care will admit for further management.        Final Clinical Impression(s) / ED Diagnoses Final diagnoses:  Cardiac arrest ([facility])  Complete heart block ([facility])    Clinical  Impression: 1. Cardiac arrest ([place])   2. Complete heart block ([facility])     Disposition: Admit  This note was prepared with assistance of Dragon voice recognition software. Occasional wrong-word or sound-a-like substitutions may have occurred due to the inherent limitations of voice recognition software.     Jaston Havens, Gwenyth Allegra, MD 2022-12-09 1014

## 2022-12-10 NOTE — ED Triage Notes (Signed)
Pt bib [facility] from [facility] as a post CPR. Called out for SOB, was found 30 minutes later by staff unresponsive. CPR started 0618 by facility. EMS lost pulses and achieved ROSC multiple times, paced upon arrival. Intubated 7.0 ET tube. Pulses lost upon arrival, CPR started

## 2022-12-10 NOTE — Progress Notes (Signed)
   Dec 13, 2022 1250  Spiritual Encounters  Type of Visit Initial  Care provided to: Family  Referral source Nurse (RN/NT/LPN)  Reason for visit Grief/loss   Chaplain responded to a call for support. The family had just received devasting news about her loved one. The family member was grieving appropriately for the patient, Sheila Flynn, but unsure what she needed to do. We explored what she was doing and what else might be important. I encouraged her to allow her self to hurt grieve and intentionally be with the patient as she goes through steps that need to happen. She planned to leave for a short while and I offered that if she wanted a chaplain to return that can happen to let a nurse know.    Danice Goltz  [facility]  [phone]

## 2022-12-10 NOTE — IPAL (Signed)
  Interdisciplinary Goals of Care Family Meeting   Date carried out: 21-Dec-2022  Location of the meeting: Bedside  Member's involved: Physician Assistant, Bedside Registered Nurse and Family Member or next of kin  Durable Power of Attorney or acting medical decision maker: Pt's daughter, Sheila Flynn  Discussion: We discussed goals of care for [employer].  I have had an extensive discussion with pt's daughter, Sheila Flynn. We discussed pt's current circumstances, organ failures, and preexisting comorbidities. We also discussed patient's prior wishes under circumstances such as this. Sheila Flynn has agreed not to perform further resuscitation if arrest were to recur, but to otherwise continue with current medical support / therapies. She will contact additional family and discuss further regarding consideration of comfort measures depending on her course over the next few hours.   Code status:   Code Status: DNR   Disposition: Continue current acute care  Time spent for the meeting: 20 min.    Montey Hora, PA-C  12-21-2022, 12:49 PM

## 2022-12-10 NOTE — Consult Note (Signed)
Cardiology Consultation   Patient ID: [name] MRN: [id number]; DOB: Mar 03, 1946  Admit date: 12/17/2022 Date of Consult: 12/17/22  PCP:  Patient, No Pcp Per   [place] Providers Cardiologist:  None        Patient Profile:   Sheila Flynn is a 77 y.o. female with a hx of diabetes who is being seen 12-17-22 for the evaluation of cardiac arrest at the request of Dr. Sherry Flynn.  History of Present Illness:   Sheila Flynn called EMS this morning with shortness of breath.  They arrived 30 minutes later and she was found unresponsive.  CPR was started at 6:18 AM.  She was in and out of PEA and reportedly ROSC was achieved multiple times.  The patient was ultimately intubated and required extensive CPR.  I was called because her postresuscitation rhythm was complete heart block with a ventricular escape in the 30s.  At the time of my arrival, the patient has required more CPR.  She has had multiple rounds of epinephrine.  She currently is on norepinephrine and epinephrine drips.  She has again achieved ROSC with persistent hypotension but now a stable heart rhythm demonstrating sinus rhythm in the 60s.  There is difficulty with oxygenating and ventilating her.  There is no other history obtainable from the patient and there is no family present.  History obtained from chart review demonstrates that the patient has had a stroke.  Outpatient notes report that she has difficulty giving much history because of aphasia.  She has a history of rectal cancer and colostomy.  She has a history of left renal mass felt to be renal carcinoma.  Not much data available in chart review over the last 6 months.  Past Medical History:  Diagnosis Date   Acute respiratory failure ([place]) 12/21/10   Aspiration pneumonia (HCC)    CAD (coronary artery disease)    Dr. 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 ([place])    Migraine    "monthly" (11/06/2014)   Myocardial infarction ([place])    "I've had about 3" (11/06/2014)   On home oxygen therapy    "prn at the nursing home" (11/06/2014)   Pulmonary hypertension ([place])    Rectal cancer [facility])    s/p colostomy   Sacral fracture, closed ([place])    s/p kyphoplasty   Stroke ([place]) 2011   postop   Stroke [facility]) ?2014   "left side sometimes weaker since" (11/06/2014)   Thyroid goiter    Vascular dementia with depressed mood ([place])     Past Surgical History:  Procedure Laterality Date   ABDOMINAL HYSTERECTOMY     APPENDECTOMY     COLON SURGERY     COLOSTOMY     rectal ca   KYPHOPLASTY     sacrum   right cataract  11-19-2015   TONSILLECTOMY     TUBAL LIGATION  Home Medications:  Prior to Admission medications   Medication Sig Start Date End Date Taking? Authorizing Provider  acetaminophen (TYLENOL) 325 MG tablet Take 650 mg by mouth every 6 (six) hours as needed for mild pain.    [provider]  amLODipine (NORVASC) 10 MG tablet Take 10 mg by mouth daily.    [provider]  apixaban (ELIQUIS) 5 MG TABS tablet Take 5 mg by mouth 2 (two) times daily.    [provider]  Artificial Tear Solution (TEARS PURE) 0.1-0.3 % SOLN Place 1 drop into both eyes every 8 (eight) hours as needed (dry eyes).    [provider]  aspirin 81 MG EC tablet Take 81 mg by mouth daily. Swallow whole.    [provider]  atorvastatin  (LIPITOR) 40 MG tablet Take 40 mg by mouth at bedtime.    [provider]  carvedilol (COREG) 6.25 MG tablet Take 6.25 mg by mouth 2 (two) times daily.    [provider]  Cholecalciferol (VITAMIN D-3) 125 MCG (5000 UT) TABS Take 5,000 Units by mouth every morning.    [provider]  eszopiclone (LUNESTA) 2 MG TABS tablet Take 2 mg by mouth at bedtime. Take immediately before bedtime    [provider]  hydrALAZINE (APRESOLINE) 25 MG tablet Take 25 mg by mouth in the morning and at bedtime.    [provider]  hydrocortisone (CORTEF) 10 MG tablet Take 1 tablet (10 mg total) by mouth daily with breakfast. Patient taking differently: Take 10 mg by mouth daily. Also takes '5mg'$  dose in the morning 03/01/21   Sheila Essex, MD  hydrocortisone (CORTEF) 5 MG tablet Take 1 tablet (5 mg total) by mouth daily with lunch. Patient taking differently: Take 5 mg by mouth every evening. Also takes '10mg'$  dose in the morning 03/01/21   Sheila Essex, MD  isosorbide mononitrate (ISMO) 20 MG tablet Take 1 tablet (20 mg total) by mouth 2 (two) times daily at 10 AM and 5 PM. 03/01/21   Sheila Essex, MD  losartan (COZAAR) 50 MG tablet Take 50 mg by mouth 2 (two) times daily with a meal.    [provider]  Melatonin 10 MG TABS Take 10 mg by mouth at bedtime.    [provider]  mirtazapine (REMERON) 7.5 MG tablet Take 7.5 mg by mouth at bedtime.    [provider]  Multiple Vitamin (MULTIVITAMIN WITH MINERALS) TABS tablet Take 1 tablet by mouth daily. 03/01/21   Sheila Essex, MD  nitroGLYCERIN (NITROSTAT) 0.4 MG SL tablet Place 0.4 mg under the tongue every 5 (five) minutes x 3 doses as needed for chest pain.    [provider]  Nutritional Supplements (,FEEDING SUPPLEMENT, PROSOURCE PLUS) liquid Take 30 mLs by mouth 2 (two) times daily between meals. 03/01/21   Sheila Essex, MD  omeprazole (PRILOSEC) 20 MG capsule Take 20 mg by mouth  daily at 6 (six) AM.    [provider]  ondansetron (ZOFRAN) 4 MG tablet Take 4 mg by mouth every 8 (eight) hours as needed for nausea.    [provider]  oxyCODONE (OXY IR/ROXICODONE) 5 MG immediate release tablet Take 5 mg by mouth every 12 (twelve) hours as needed for moderate pain or severe pain.    [provider]  Polyethyl Glycol-Propyl Glycol (SYSTANE) 0.4-0.3 % SOLN Place 1 drop into both eyes daily.    [provider]  polyethylene glycol (MIRALAX / GLYCOLAX) 17 g packet  Take 17 g by mouth daily. 03/01/21   Sheila Essex, MD  sennosides-docusate sodium (SENOKOT-S) 8.6-50 MG tablet Take 1 tablet by mouth daily.    [provider]    Inpatient Medications: Scheduled Meds:  Continuous Infusions:  epinephrine 5 mcg/min (November 26, 2022 0717)   norepinephrine (LEVOPHED) Adult infusion 40 mcg/min (Nov 26, 2022 0809)   PRN Meds:   Allergies:    Allergies  Allergen Reactions   Ace Inhibitors Swelling    Social History:   Social History   Socioeconomic History   Marital status: Divorced    Spouse name: Not on file   Number of children: Not on file   Years of education: Not on file   Highest education level: Not on file  Occupational History    Employer: UNEMPLOYED    Comment: custodial  Tobacco Use   Smoking status: Former    Packs/day: 0.50    Years: 2.00    Total pack years: 1.00    Types: Cigarettes   Smokeless tobacco: Never   Tobacco comments:    "quit smoking cigarettes in 1969"  Substance and Sexual Activity   Alcohol use: No   Drug use: Yes    Comment: previously smoked crack, has had positive UDS for amphetamines and narcotics   Sexual activity: Not Currently  Other Topics Concern   Not on file  Social History Narrative   Born in [place], has some high school education, retired from Sports coach job, divorced, has children   Social Determinants of Radio broadcast assistant Strain: Not on file  Food  Insecurity: Not on file  Transportation Needs: Not on file  Physical Activity: Not on file  Stress: Not on file  Social Connections: Not on file  Intimate Partner Violence: Not on file    Family History:   No family history on file.  Not obtainable at this time  ROS:  Please see the history of present illness.  Review of systems not obtainable at this time due to patient's critical condition     Physical Exam/Data:   Vitals:   November 26, 2022 0725 2022-11-26 0725  BP: (!) 90/52 90/60  Pulse:  85  Resp:  (!) 22  SpO2:  95%   No intake or output data in the 24 hours ending 11-26-22 0823    01/14/2022   11:51 AM 02/24/2021    9:19 PM 02/24/2021   11:00 AM  Last 3 Weights  Weight (lbs) 154 lb 5.2 oz 148 lb 9.4 oz 152 lb 1.9 oz  Weight (kg) 70 kg 67.4 kg 69 kg     There is no height or weight on file to calculate BMI.  General: Intubated, unresponsive HEENT: normal Neck: no JVD Vascular: No carotid bruits; Distal pulses nonpalpable, femoral pulses 1+ Cardiac: Distant heart sounds, regular rate and rhythm, no murmur appreciable Lungs: Coarse breath sounds bilaterally Abd: soft, no obvious masses palpable, colostomy in place Ext: 1+ right ankle edema, 1+ left ankle edema Musculoskeletal:  No deformities Skin: warm and dry  Neuro: Comatose, unresponsive Psych: Unable to assess  EKG:  The EKG was personally reviewed and demonstrates: Complete AV block with a ventricular escape rhythm 39 bpm  Telemetry:  Telemetry was personally reviewed and demonstrates: Now in sinus rhythm  Relevant CV Studies: None available, chart reviewed  Laboratory Data:  High Sensitivity Troponin:   Recent Labs  Lab Nov 26, 2022 [place]  Lab 11-26-2022 0712 26-Nov-2022 0724 11/26/22 0807  NA  140 140 141  K 4.5 4.5 4.8  CL 103 106  --   CO2 17*  --   --   GLUCOSE 268* 259*  --   BUN 25* 35*  --   CREATININE 2.69* 2.50*  --   CALCIUM 8.2*  --   --   MG 2.5*  --    --   GFRNONAA 18*  --   --   ANIONGAP 20*  --   --     Recent Labs  Lab 11/27/2022 0712  PROT 6.0*  ALBUMIN 2.8*  AST 626*  ALT 511*  ALKPHOS 76  BILITOT 0.3   Lipids No results for input(s): "CHOL", "TRIG", "HDL", "LABVLDL", "LDLCALC", "CHOLHDL" in the last 168 hours.  Hematology Recent Labs  Lab Nov 27, 2022 0712 27-Nov-2022 0724 11-27-22 0807  WBC 14.1*  --   --   RBC 4.14  --   --   HGB 10.1* 11.9* 8.5*  HCT 35.7* 35.0* 25.0*  MCV 86.2  --   --   MCH 24.4*  --   --   MCHC 28.3*  --   --   RDW 17.2*  --   --   PLT 235  --   --    Thyroid No results for input(s): "TSH", "FREET4" in the last 168 hours.  BNPNo results for input(s): "BNP", "PROBNP" in the last 168 hours.  DDimer No results for input(s): "DDIMER" in the last 168 hours.   Radiology/Studies:  DG Chest Portable 1 View  Result Date: 2022-11-27 CLINICAL DATA:  Status post CPR. EXAM: PORTABLE CHEST 1 VIEW COMPARISON:  01/14/2022 FINDINGS: 0735 hours. The cardio pericardial silhouette is enlarged. There is pulmonary vascular congestion without overt pulmonary edema. Patchy airspace disease in the right infrahilar lung may be related to atelectasis or pneumonia. Aspiration not excluded. Endotracheal tube tip is 2.0 cm above the base of the carina. NG tube tip is in the distal esophagus near the esophagogastric junction. Tube could be advanced 10-12 cm to place the proximal side port below the GE junction. Soft tissue opacity in the right paratracheal region is compatible with the patient's known thyroid enlargement. Bones are diffusely demineralized. Telemetry leads overlie the chest. IMPRESSION: 1. Endotracheal tube tip 2.0 cm above the base of the carina. 2. NG tube tip is in the distal esophagus near the esophagogastric junction. Tube could be advanced 10-12 cm to place the proximal side port below the GE junction. If tube is repositioned, repeat x-ray recommended to confirm position. 3. Infrahilar right lung airspace  disease compatible with atelectasis/infiltrate. Aspiration a consideration. 4. No pneumothorax or substantial pleural effusion. Electronically Signed   By: Misty Stanley M.D.   On: Nov 27, 2022 07:52     Assessment and Plan:   PEA cardiac arrest with persistent shock Severe combined respiratory and metabolic acidosis pH 6.9 Hypoxemic respiratory failure Acute on chronic kidney injury secondary to #1  The patient has had intermittent CPR now for approximately 2 hours.  ROSC has been achieved multiple times.  She has received multiple rounds of epinephrine for resuscitation.  She is currently requiring epinephrine and norepinephrine drips.  As outlined above, she has a severe acidosis with pH less than 7.0, lactate greater than 9, and hypercarbic, hypoxemic respiratory failure.  I think she has an extremely poor prognosis and it is unclear at this time what her underlying etiology of cardiac arrest is.  Unfortunately she is not a candidate for emergent cardiac procedures based on her severe comorbid conditions  outlined above. As part of her evaluation today, I personally drew an ABG from the femoral artery to evaluate her acid/base status. Personally discussed her case with Dr Sheila Flynn EDP as well as the critical care team who is at the bedside.   The patient is critically ill with multiple organ systems failure and requires high complexity decision making for assessment and support, frequent evaluation and titration of therapies, application of advanced monitoring technologies and extensive interpretation of multiple databases.   Critical Care Time devoted to patient care services described in this note is 45 minutes.    Risk Assessment/Risk Scores:                For questions or updates, please contact [place] Please consult [email or web address] for contact info under    Signed, Sherren Mocha, MD  Dec 02, 2022 8:23 AM

## 2022-12-10 NOTE — Death Summary Note (Signed)
  DEATH SUMMARY   Patient Details  Name: Sheila Flynn MRN: [id number] DOB: 1945/12/21  Admission/Discharge Information   Admit Date:  2022-12-23  Date of Death: Date of Death: 12-23-2022  Time of Death: Time of Death: 02/16/45  Length of Stay: 1  Referring Physician: Patient, No Pcp Per   Reason(s) for Hospitalization   Cardiac arrest  Diagnoses  Preliminary cause of death:  Septic shock present on admission Clostridium bacteremia Cardiac arrest Severe metabolic acidosis Acute metabolic encephalopathy due to sepsis Acute kidney injury Lactic acidosis Acute liver failure DNR status, comfort care  Secondary Diagnoses (including complications and co-morbidities):  Principal Problem:   Cardiac arrest [facility]) Active Problems:   Complete heart block (HCC)   Chronic respiratory failure with hypoxia and hypercapnia (HCC)   Metabolic acidosis   Shock ([place])   Acute encephalopathy   Acute respiratory failure with hypoxia and hypercapnia (HCC) Coronary artery disease Congestive heart failure History of diabetes, CVA History of vascular dementia Adrenal insufficiency History of rectal cancer s/p colostomy Possible renal cell cancer Chronic malnutrition present on admission  Brief Hospital Course (including significant findings, care, treatment, and services provided and events leading to death)  Sheila Flynn is a 77 y.o. female who has extensive PMH as below including but not limited to prior CVA, rectal CA s/p colostomy, left renal mass felt to be renal carcinoma, CAD, CHF. She resides at [facility]. She was brought to [facility] ED early AM Dec 23, 2022 after cardiac arrest. She apparently called out for dyspnea and was then found by staff unresponsive some 30 minutes later. EMS was called and found pt in PEA. ACLS was started and after ~30 minutes, she had ROSC along with complete heart block for which she required temp pacing.    After arrival to ED, she continued to have intermittent  loss of pulses and required almost an additional 1 hour of on and off CPR/ACLS. She was started on Epinephrine and Norepinephrine infusions and intermittently required temp pacing. She did have one shockable rhythm (VF) and was shocked out of it. She then developed a NSR with HR in ~70.   She was evaluated by cardiology and PCCM. Along with EDP, all three service lines felt that due to pt's instability and on and off loss of pulses for potentially up to 2 hours, she was not a candidate for emergent cardiac cath etc. She was started on Unasyn for antibiotic coverage, remained on multiple pressors at full dose and stress dose steroids.  She was also given bicarbonate for severe acidosis.  Admitted to the ICU but remained in profound shock with multiorgan failure  Family arrived at the bedside and we had extensive discussion with daughter Sheila Flynn. We discussed pt's current circumstances, organ failures, and preexisting comorbidities. We also discussed patient's prior wishes under circumstances such as this. Sheila Flynn has agreed not to perform further resuscitation if arrest were to recur, but to otherwise continue with current medical support / therapies.   She went into PEA arrest later that day and passed away.  She has not resuscitated per wishes of the family.   Signature:   Marshell Garfinkel MD [facility] 12/01/2022, 11:14 AM

## 2022-12-10 NOTE — Consult Note (Signed)
NAME:  Sheila Flynn, MRN:  [id number], DOB:  [birth year], LOS: 0 ADMISSION DATE:  12-22-22, CONSULTATION DATE:  Dec 22, 2022 REFERRING MD:  Tegeler CHIEF COMPLAINT:  Cardiac Arrest   History of Present Illness:  Sheila Flynn is a 77 y.o. female who has extensive PMH as below including but not limited to prior CVA, rectal CA s/p colostomy, left renal mass felt to be renal carcinoma, CAD, CHF. She resides at [facility]. She was brought to [facility] ED early AM 12-22-2022 after cardiac arrest. She apparently called out for dyspnea and was then found by staff unresponsive some 30 minutes later. EMS was called and found pt in PEA. ACLS was started and after ~30 minutes, she had ROSC along with complete heart block for which she required temp pacing.   After arrival to ED, she continued to have intermittent loss of pulses and required almost an additional 1 hour of on and off CPR/ACLS. She was started on Epinephrine and Norepinephrine infusions and intermittently required temp pacing. She did have one shockable rhythm (VF) and was shocked out of it. She then developed a NSR with HR in ~70.  She was evaluated by cardiology and PCCM. Along with EDP, all three service lines felt that due to pt's instability and on and off loss of pulses for potentially up to 2 hours, she was not a candidate for emergent cardiac cath etc. It was also felt that given pt's prior history and extremely grave prognosis in setting of her arrest of unclear etiology, that ongoing aggressive measures was not in her best interest. Multiple attempts were made to call pt's family by Dr. Vaughan Browner without success. He did leave a voicemail to call us back. After 3 physician agreement, she was admitted with ongoing supportive care and DNR limitations in the event of recurrent arrest or worsening shock.   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  Significant Hospital Events: Including procedures, antibiotic start and stop dates in addition to other pertinent events   12/22/22 admit  Interim History / Subjective:  Non-responsive on vent. Ongoing persistent shock despite 40 Levo, 20 Epi. Has had ROSC since 0753.  Objective:  Blood pressure (!) 61/40, pulse 62, resp. rate (!) 24, height '5\' 7"'$  (1.702 m), weight 70 kg, SpO2 98 %.    Vent Mode: PRVC FiO2 (%):  [100 %] 100 % Set Rate:  [22 bmp-35 bmp] 35 bmp Vt Set:  [490 mL] 490 mL PEEP:  [8 cmH20] 8 cmH20 Plateau Pressure:  [21 cmH20] 21 cmH20  No intake or output data in the 24 hours ending 22-Dec-2022 0931 Filed Weights   2022/12/22 0917  Weight: 70 kg    Examination: General: Adult female, critically ill. Neuro: Unresponsive on vent despite no sedation. HEENT: [place]/AT. Sclerae anicteric. Pupils dilated and sluggish. ETT in place. Cardiovascular: RRR, no M/R/G.  Lungs: Respirations even and unlabored.  Coarse bilaterally Abdomen: Obese, colostomy in place. BS hypoactive, NT/ND.  Musculoskeletal:  No gross deformities, no edema.  Skin: Intact, warm, no rashes.  Labs/imaging personally reviewed:  CXR December 12, 2022 > possible RLL infiltrate vs atelectasis. Echo 2022-12-12 >  CT head December 12, 2022 >   Assessment & Plan:   Cardiac Arrest - unclear etiology and almost 2 hours on  and off loss of pulses. Initially had CHB followed by PEA and VF; however, converted to NSR after was shocked for VF x 1. - Ongoing supportive care. - F/u on echo, CT head. - Heparin infusion in lieu of home Apixban (unclear reason). - Cardiology following, appreciate the assistance.  Ongoing / refractory shock - unclear etiology but suspect cardiogenic given prolonged arrest. - Continue Levophed, Epi at current dosing. No escalation agreed upon by EDP, cardiology, PCCM secondary to futility. - F/u on troponin, lactate, cortisol. - F/u on echo. - Blood and sputum cultures, UA.  Hx CAD, CHF, MI. - Continue home ASA. - Hold home Amlodipine, Atorvastatin, Carvedilol, Hydralazine, Isosorbide Mononitrate, Losartan.  Acute hypoxic and hypercapnic respiratory failure - 2/2 above. Possible aspiration PNA. Hx COPD, PAH. - Full vent support. - Bronchial hygiene. - Empiric Unasyn. - F/u cultures. - Follow CXR.  Profound metabolic + respiratory acidosis. AKI. Hypocalcemia. - Bicarb pushes x 2 followed by infusion. - 1g Ca gluconate. - BMP q12hrs for now.  Transaminitis - presumed 2/2 shock state. - Trend LFT's.  Hx DM. - SSI.  Presumed hx adrenal insufficiency - on cortef chronically. - Stress dose steroids for now. - Hold home Cortef.  Hx CVA, vascular dementia. - Supportive care.  Hx rectal CA s/p colostomy. Possible renal carcinoma. - F/u as outpatient.  Best practice (evaluated daily):  Diet/type: NPO DVT prophylaxis: systemic heparin GI prophylaxis: PPI Lines: Central line and arterial line pending upon arrival to ICU Foley:  Yes, and it is still needed Code Status:  DNR Last date of multidisciplinary goals of care discussion: None yet.  Labs   CBC: Recent Labs  Lab December 12, 2022 0712 2022-12-12 0724 12/12/22 0807 12/12/22 0908  WBC 14.1*  --   --   --   NEUTROABS 6.2  --   --   --   HGB 10.1* 11.9* 8.5* 8.5*  HCT 35.7* 35.0* 25.0* 25.0*  MCV 86.2  --   --   --    PLT 235  --   --   --     Basic Metabolic Panel: Recent Labs  Lab December 12, 2022 0712 2022/12/12 0724 12/12/22 0807 2022/12/12 0908  NA 140 140 141 139  K 4.5 4.5 4.8 5.2*  CL 103 106  --   --   CO2 17*  --   --   --   GLUCOSE 268* 259*  --   --   BUN 25* 35*  --   --   CREATININE 2.69* 2.50*  --   --   CALCIUM 8.2*  --   --   --   MG 2.5*  --   --   --    GFR: Estimated Creatinine Clearance: 18.9 mL/min (A) (by C-G formula based on SCr of 2.5 mg/dL (H)). Recent Labs  Lab 12-12-2022 0712 December 12, 2022 0718  WBC 14.1*  --   LATICACIDVEN  --  >9.0*    Liver Function Tests: Recent Labs  Lab 12/12/2022 0712  AST 626*  ALT 511*  ALKPHOS 76  BILITOT 0.3  PROT 6.0*  ALBUMIN 2.8*   No results for input(s): "LIPASE", "AMYLASE" in the last 168 hours. No results for input(s): "AMMONIA" in the  last 168 hours.  ABG    Component Value Date/Time   PHART 7.242 (L) 11/28/22 0908   PCO2ART 36.6 28-Nov-2022 0908   PO2ART 74 (L) 11-28-2022 0908   HCO3 15.8 (L) Nov 28, 2022 0908   TCO2 17 (L) Nov 28, 2022 0908   ACIDBASEDEF 11.0 (H) 28-Nov-2022 0908   O2SAT 92 November 28, 2022 0908     Coagulation Profile: No results for input(s): "INR", "PROTIME" in the last 168 hours.  Cardiac Enzymes: No results for input(s): "CKTOTAL", "CKMB", "CKMBINDEX", "TROPONINI" in the last 168 hours.  HbA1C: Hemoglobin A1C  Date/Time Value Ref Range Status  03/04/2017 12:00 AM 8.0  Final  08/14/2016 12:00 AM 6.7  Final   Hgb A1c MFr Bld  Date/Time Value Ref Range Status  02/24/2021 08:32 PM 6.0 (H) 4.8 - 5.6 % Final    Comment:    (NOTE) Pre diabetes:          5.7%-6.4%  Diabetes:              >6.4%  Glycemic control for   <7.0% adults with diabetes     CBG: Recent Labs  Lab 11/28/22 0904  GLUCAP 214*    Review of Systems:   Unable to obtain as pt is encephalopathic.  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   Surgical History:   Past Surgical History:  Procedure Laterality Date   ABDOMINAL HYSTERECTOMY     APPENDECTOMY     COLON SURGERY     COLOSTOMY     rectal ca   KYPHOPLASTY     sacrum   right cataract  11-19-2015   TONSILLECTOMY     TUBAL LIGATION       Social History:   reports that she has quit smoking. Her smoking use included cigarettes. She has a 1.00 pack-year smoking history. She has never used smokeless tobacco. She reports current drug use. She reports that she does not drink alcohol.   Family History:  Her family history is not on file.   Allergies Allergies  Allergen Reactions   Ace Inhibitors Swelling     Home Medications  Prior to Admission medications   Medication Sig Start Date End Date Taking? Authorizing Provider  acetaminophen (TYLENOL) 325 MG tablet Take 650 mg by mouth every 6 (six) hours as needed for mild pain.    [provider]  amLODipine (NORVASC) 10 MG tablet Take 10 mg by mouth daily.    [provider]  apixaban (ELIQUIS) 5 MG TABS tablet Take 5 mg by mouth 2 (two) times daily.    [provider]  Artificial Tear Solution (TEARS PURE) 0.1-0.3 % SOLN Place 1 drop into both eyes every 8 (eight) hours as needed (dry eyes).    [provider]  aspirin 81 MG EC tablet Take 81  mg by mouth daily. Swallow whole.    [provider]  atorvastatin (LIPITOR) 40 MG tablet Take 40 mg by mouth at bedtime.    [provider]  carvedilol (COREG) 6.25 MG tablet Take 6.25 mg by mouth 2 (two) times daily.    [provider]  Cholecalciferol (VITAMIN D-3) 125 MCG (5000 UT) TABS Take 5,000 Units by mouth every morning.    [provider]  eszopiclone (LUNESTA) 2 MG TABS tablet Take 2 mg by mouth at bedtime. Take immediately before bedtime    [provider]  hydrALAZINE (APRESOLINE) 25 MG tablet Take 25 mg by mouth in the morning and at bedtime.    [provider]  hydrocortisone (CORTEF) 10 MG tablet Take 1 tablet (10 mg total) by mouth daily with breakfast. Patient taking differently: Take 10 mg by mouth daily. Also takes '5mg'$  dose in the morning 03/01/21   Ezequiel Essex, MD  hydrocortisone (CORTEF) 5 MG tablet Take 1 tablet (5 mg total) by mouth daily with lunch. Patient taking differently: Take 5 mg by mouth every evening. Also takes '10mg'$  dose in the morning 03/01/21   Ezequiel Essex, MD  isosorbide mononitrate (ISMO) 20 MG tablet Take 1 tablet (20 mg total) by mouth 2 (two) times daily at 10 AM and 5 PM. 03/01/21   Ezequiel Essex, MD  losartan (COZAAR) 50 MG tablet Take 50 mg by mouth 2 (two) times daily with a meal.    [provider]  Melatonin 10 MG TABS Take 10 mg by mouth at bedtime.    [provider]  mirtazapine (REMERON) 7.5 MG tablet Take 7.5 mg by mouth at bedtime.    [provider]  Multiple Vitamin (MULTIVITAMIN WITH MINERALS) TABS tablet Take 1 tablet by mouth daily. 03/01/21   Ezequiel Essex, MD  nitroGLYCERIN (NITROSTAT) 0.4 MG SL tablet Place 0.4 mg under the tongue every 5 (five)  minutes x 3 doses as needed for chest pain.    [provider]  Nutritional Supplements (,FEEDING SUPPLEMENT, PROSOURCE PLUS) liquid Take 30 mLs by mouth 2 (two) times daily between meals. 03/01/21   Ezequiel Essex, MD  omeprazole (PRILOSEC) 20 MG capsule Take 20 mg by mouth daily at 6 (six) AM.    [provider]  ondansetron (ZOFRAN) 4 MG tablet Take 4 mg by mouth every 8 (eight) hours as needed for nausea.    [provider]  oxyCODONE (OXY IR/ROXICODONE) 5 MG immediate release tablet Take 5 mg by mouth every 12 (twelve) hours as needed for moderate pain or severe pain.    [provider]  Polyethyl Glycol-Propyl Glycol (SYSTANE) 0.4-0.3 % SOLN Place 1 drop into both eyes daily.    [provider]  polyethylene glycol (MIRALAX / GLYCOLAX) 17 g packet Take 17 g by mouth daily. 03/01/21   Ezequiel Essex, MD  sennosides-docusate sodium (SENOKOT-S) 8.6-50 MG tablet Take 1 tablet by mouth daily.    [provider]     Critical care time: 90 minutes   Montey Hora, [place] - [facility] For pager details, please see AMION or use Epic chat  After 1900, please call [facility] for cross coverage needs 10-Dec-2022, 9:31 AM

## 2022-12-10 NOTE — Progress Notes (Addendum)
ANTICOAGULATION CONSULT NOTE   Pharmacy Consult for Heparin Indication:  Home Eliquis - unclear indication  Allergies  Allergen Reactions   Ace Inhibitors Swelling    Patient Measurements: Height: '5\' 7"'$  (170.2 cm) Weight: 70 kg (154 lb 5.2 oz) IBW/kg (Calculated) : 61.6 Heparin Dosing Weight: 70 kg  Vital Signs: BP: 61/40 2022-11-30 0803) Pulse Rate: 62 November 30, 2022 0800)  Labs: Recent Labs    2022/11/30 0712 2022-11-30 0724 30-Nov-2022 0807 2022-11-30 0908  HGB 10.1* 11.9* 8.5* 8.5*  HCT 35.7* 35.0* 25.0* 25.0*  PLT 235  --   --   --   CREATININE 2.69* 2.50*  --   --   TROPONINIHS 60*  --   --   --     Estimated Creatinine Clearance: 18.9 mL/min (A) (by C-G formula based on SCr of 2.5 mg/dL (H)).   Medical History: Past Medical History:  Diagnosis Date   Acute respiratory failure ([place]) 12/21/10   Aspiration pneumonia (HCC)    CAD (coronary artery disease)    Dr. 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 ([place])    Migraine    "monthly" (11/06/2014)   Myocardial infarction ([place])    "I've had about 3" (11/06/2014)   On home oxygen therapy    "prn at the nursing home" (11/06/2014)   Pulmonary hypertension ([place])    Rectal cancer [facility])    s/p colostomy   Sacral fracture, closed ([place])    s/p kyphoplasty   Stroke ([place]) 2011   postop   Stroke [facility]) ?2014   "left side sometimes weaker since" (11/06/2014)   Thyroid goiter     Vascular dementia with depressed mood ([place])     Assessment: 76 YOF presenting as post-arrest on Eliquis PTA. Unclear indication based on chart history. Pharmacy consulted to dose heparin while critically ill.  Hgb has been stable during admission thus far. No signs of overt bleeding. Will monitor aPTT and heparin level given possible recent Eliquis use.  Goal of Therapy:  Heparin level 0.3-0.7 units/ml aPTT 66-102 seconds Monitor platelets by anticoagulation protocol: Yes   Plan:  Start heparin 1100 units/hr (16 units/kg/hr) Will not bolus given unknown last dose of Eliquis and unclear indication Check heparin level and aPTT at 1800 Monitor for signs/symptoms of bleeding  Merrilee Jansky, PharmD Clinical Pharmacist November 30, 2022,10:13 AM

## 2022-12-10 NOTE — Progress Notes (Signed)
ABG results with RR 35 on vent listed below.   Latest Reference Range & Units 12/22/2022 09:08  Sample type  ARTERIAL  pH, Arterial 7.35 - 7.45  7.242 (L)  pCO2 arterial 32 - 48 mmHg 36.6  pO2, Arterial 83 - 108 mmHg 74 (L)  TCO2 22 - 32 mmol/L 17 (L)  Acid-base deficit 0.0 - 2.0 mmol/L 11.0 (H)  Bicarbonate 20.0 - 28.0 mmol/L 15.8 (L)  O2 Saturation % 92  (L): Data is abnormally low (H): Data is abnormally high

## 2022-12-10 NOTE — ED Notes (Signed)
Rosc achieved

## 2022-12-10 NOTE — Procedures (Signed)
Arterial Catheter Insertion Procedure Note  Sheila Flynn  [id number]  08-10-1946  Date:16-Dec-2022  Time:11:14 AM    Provider Performing: Montey Hora    Procedure: Insertion of Arterial Line [phone]) with US guidance (14239)   Indication(s) Blood pressure monitoring and/or need for frequent ABGs  Consent Unable to obtain consent due to emergent nature of procedure.  Anesthesia None   Time Out Verified patient identification, verified procedure, site/side was marked, verified correct patient position, special equipment/implants available, medications/allergies/relevant history reviewed, required imaging and test results available.   Sterile Technique Maximal sterile technique including full sterile barrier drape, hand hygiene, sterile gown, sterile gloves, mask, hair covering, sterile ultrasound probe cover (if used).   Procedure Description Area of catheter insertion was cleaned with chlorhexidine and draped in sterile fashion. With real-time ultrasound guidance an arterial catheter was placed into the right femoral artery.  Appropriate arterial tracings confirmed on monitor.     Complications/Tolerance None; patient tolerated the procedure well.   EBL Minimal   Specimen(s) None   Montey Hora, PA - C [facility] For pager details, please see AMION or use Epic chat  After 1900, please call [facility] for cross coverage needs 12/16/22, 11:14 AM

## 2022-12-10 NOTE — ED Notes (Signed)
RN attempted to call

## 2022-12-10 NOTE — Progress Notes (Signed)
PCCM Interval Progress Note  Attempted to call family contacts listed in chart (daughter Jeral Pinch and granddaughter Antoniette Peake) but no answer x 2.   Will re-attempt again later today.

## 2022-12-10 NOTE — Progress Notes (Signed)
Was able to reach patients daughter via phone.  I explained to her what was going on and answered all questions.  She sated she will be here within the hour.

## 2022-12-10 DEATH — deceased
